# Patient Record
Sex: Female | Born: 1965
Health system: Southern US, Community
[De-identification: ages and names within clinical notes are randomized; demographics above are authoritative.]

## PROBLEM LIST (undated history)

## (undated) DIAGNOSIS — R011 Cardiac murmur, unspecified: Secondary | ICD-10-CM

## (undated) DIAGNOSIS — M5136 Other intervertebral disc degeneration, lumbar region: Secondary | ICD-10-CM

## (undated) DIAGNOSIS — J45909 Unspecified asthma, uncomplicated: Secondary | ICD-10-CM

## (undated) DIAGNOSIS — E7800 Pure hypercholesterolemia, unspecified: Secondary | ICD-10-CM

## (undated) DIAGNOSIS — M810 Age-related osteoporosis without current pathological fracture: Secondary | ICD-10-CM

## (undated) DIAGNOSIS — G4730 Sleep apnea, unspecified: Secondary | ICD-10-CM

## (undated) DIAGNOSIS — M5126 Other intervertebral disc displacement, lumbar region: Secondary | ICD-10-CM

## (undated) DIAGNOSIS — T8859XA Other complications of anesthesia, initial encounter: Secondary | ICD-10-CM

## (undated) DIAGNOSIS — G709 Myoneural disorder, unspecified: Secondary | ICD-10-CM

## (undated) DIAGNOSIS — K219 Gastro-esophageal reflux disease without esophagitis: Secondary | ICD-10-CM

## (undated) DIAGNOSIS — I10 Essential (primary) hypertension: Secondary | ICD-10-CM

## (undated) DIAGNOSIS — R002 Palpitations: Secondary | ICD-10-CM

## (undated) DIAGNOSIS — I38 Endocarditis, valve unspecified: Secondary | ICD-10-CM

## (undated) DIAGNOSIS — G35 Multiple sclerosis: Secondary | ICD-10-CM

## (undated) DIAGNOSIS — M8580 Other specified disorders of bone density and structure, unspecified site: Secondary | ICD-10-CM

## (undated) DIAGNOSIS — T7840XA Allergy, unspecified, initial encounter: Secondary | ICD-10-CM

## (undated) DIAGNOSIS — M1990 Unspecified osteoarthritis, unspecified site: Secondary | ICD-10-CM

## (undated) DIAGNOSIS — G35D Multiple sclerosis, unspecified: Secondary | ICD-10-CM

## (undated) DIAGNOSIS — D6851 Activated protein C resistance: Secondary | ICD-10-CM

## (undated) DIAGNOSIS — T4145XA Adverse effect of unspecified anesthetic, initial encounter: Secondary | ICD-10-CM

## (undated) DIAGNOSIS — M51369 Other intervertebral disc degeneration, lumbar region without mention of lumbar back pain or lower extremity pain: Secondary | ICD-10-CM

## (undated) HISTORY — DX: Allergy, unspecified, initial encounter: T78.40XA

## (undated) HISTORY — DX: Myoneural disorder, unspecified: G70.9

## (undated) HISTORY — DX: Essential (primary) hypertension: I10

## (undated) HISTORY — DX: Other intervertebral disc degeneration, lumbar region without mention of lumbar back pain or lower extremity pain: M51.369

## (undated) HISTORY — PX: CHOLECYSTECTOMY: SHX55

## (undated) HISTORY — DX: Pure hypercholesterolemia, unspecified: E78.00

## (undated) HISTORY — DX: Sleep apnea, unspecified: G47.30

## (undated) HISTORY — PX: NECK LESION BIOPSY: SHX2078

## (undated) HISTORY — DX: Palpitations: R00.2

## (undated) HISTORY — DX: Other specified disorders of bone density and structure, unspecified site: M85.80

## (undated) HISTORY — PX: TOE SURGERY: SHX1073

## (undated) HISTORY — PX: JOINT REPLACEMENT: SHX530

## (undated) HISTORY — DX: Multiple sclerosis, unspecified: G35.D

## (undated) HISTORY — PX: ABDOMINAL HYSTERECTOMY: SHX81

## (undated) HISTORY — PX: TUBAL LIGATION: SHX77

## (undated) HISTORY — DX: Endocarditis, valve unspecified: I38

## (undated) HISTORY — PX: APPENDECTOMY: SHX54

## (undated) HISTORY — DX: Multiple sclerosis: G35

## (undated) HISTORY — PX: OTHER SURGICAL HISTORY: SHX169

## (undated) HISTORY — PX: PATELLA FRACTURE SURGERY: SHX735

## (undated) HISTORY — PX: KNEE LIGAMENT RECONSTRUCTION: SHX1895

## (undated) HISTORY — DX: Unspecified asthma, uncomplicated: J45.909

## (undated) HISTORY — DX: Activated protein C resistance: D68.51

## (undated) HISTORY — DX: Other intervertebral disc displacement, lumbar region: M51.26

## (undated) HISTORY — DX: Age-related osteoporosis without current pathological fracture: M81.0

## (undated) HISTORY — PX: PELVIC LAPAROSCOPY: SHX162

## (undated) HISTORY — PX: TONSILLECTOMY: SUR1361

## (undated) HISTORY — PX: FRACTURE SURGERY: SHX138

## (undated) HISTORY — DX: Other intervertebral disc degeneration, lumbar region: M51.36

---
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
## 1998-03-28 ENCOUNTER — Other Ambulatory Visit: Admission: RE | Admit: 1998-03-28 | Discharge: 1998-03-28 | Payer: Self-pay | Admitting: Gynecology

## 1998-04-28 HISTORY — PX: TOTAL ABDOMINAL HYSTERECTOMY W/ BILATERAL SALPINGOOPHORECTOMY: SHX83

## 1998-07-31 ENCOUNTER — Inpatient Hospital Stay (HOSPITAL_COMMUNITY): Admission: RE | Admit: 1998-07-31 | Discharge: 1998-08-02 | Payer: Self-pay | Admitting: Gynecology

## 1998-12-04 ENCOUNTER — Other Ambulatory Visit: Admission: RE | Admit: 1998-12-04 | Discharge: 1998-12-04 | Payer: Self-pay | Admitting: Gynecology

## 1999-11-10 ENCOUNTER — Ambulatory Visit (HOSPITAL_COMMUNITY): Admission: RE | Admit: 1999-11-10 | Discharge: 1999-11-10 | Payer: Self-pay

## 2000-01-14 ENCOUNTER — Other Ambulatory Visit: Admission: RE | Admit: 2000-01-14 | Discharge: 2000-01-14 | Payer: Self-pay | Admitting: Gynecology

## 2001-01-14 ENCOUNTER — Other Ambulatory Visit: Admission: RE | Admit: 2001-01-14 | Discharge: 2001-01-14 | Payer: Self-pay | Admitting: Gynecology

## 2001-05-21 ENCOUNTER — Other Ambulatory Visit: Admission: RE | Admit: 2001-05-21 | Discharge: 2001-05-21 | Payer: Self-pay | Admitting: Otolaryngology

## 2001-06-01 ENCOUNTER — Other Ambulatory Visit: Admission: RE | Admit: 2001-06-01 | Discharge: 2001-06-01 | Payer: Self-pay | Admitting: Otolaryngology

## 2002-03-16 ENCOUNTER — Other Ambulatory Visit: Admission: RE | Admit: 2002-03-16 | Discharge: 2002-03-16 | Payer: Self-pay | Admitting: Gynecology

## 2003-03-21 ENCOUNTER — Other Ambulatory Visit: Admission: RE | Admit: 2003-03-21 | Discharge: 2003-03-21 | Payer: Self-pay | Admitting: Gynecology

## 2003-06-22 ENCOUNTER — Other Ambulatory Visit: Admission: RE | Admit: 2003-06-22 | Discharge: 2003-06-22 | Payer: Self-pay | Admitting: Gynecology

## 2004-05-03 ENCOUNTER — Other Ambulatory Visit: Admission: RE | Admit: 2004-05-03 | Discharge: 2004-05-03 | Payer: Self-pay | Admitting: Gynecology

## 2005-08-08 ENCOUNTER — Other Ambulatory Visit: Admission: RE | Admit: 2005-08-08 | Discharge: 2005-08-08 | Payer: Self-pay | Admitting: Gynecology

## 2005-11-16 ENCOUNTER — Emergency Department (HOSPITAL_COMMUNITY): Admission: EM | Admit: 2005-11-16 | Discharge: 2005-11-16 | Payer: Self-pay | Admitting: Emergency Medicine

## 2006-09-09 ENCOUNTER — Other Ambulatory Visit: Admission: RE | Admit: 2006-09-09 | Discharge: 2006-09-09 | Payer: Self-pay | Admitting: Gynecology

## 2007-12-02 ENCOUNTER — Other Ambulatory Visit: Admission: RE | Admit: 2007-12-02 | Discharge: 2007-12-02 | Payer: Self-pay | Admitting: Gynecology

## 2008-02-01 ENCOUNTER — Ambulatory Visit: Payer: Self-pay | Admitting: Gynecology

## 2008-06-15 ENCOUNTER — Ambulatory Visit: Payer: Self-pay | Admitting: Gynecology

## 2009-01-02 ENCOUNTER — Other Ambulatory Visit: Admission: RE | Admit: 2009-01-02 | Discharge: 2009-01-02 | Payer: Self-pay | Admitting: Gynecology

## 2009-01-02 ENCOUNTER — Ambulatory Visit: Payer: Self-pay | Admitting: Gynecology

## 2009-01-02 ENCOUNTER — Encounter: Payer: Self-pay | Admitting: Gynecology

## 2010-01-26 ENCOUNTER — Inpatient Hospital Stay (HOSPITAL_COMMUNITY): Admission: EM | Admit: 2010-01-26 | Discharge: 2010-01-31 | Payer: Self-pay | Admitting: Emergency Medicine

## 2010-01-26 DIAGNOSIS — L02419 Cutaneous abscess of limb, unspecified: Secondary | ICD-10-CM | POA: Insufficient documentation

## 2010-01-26 DIAGNOSIS — L03119 Cellulitis of unspecified part of limb: Secondary | ICD-10-CM

## 2010-01-27 ENCOUNTER — Ambulatory Visit: Payer: Self-pay | Admitting: Infectious Diseases

## 2010-02-11 ENCOUNTER — Telehealth: Payer: Self-pay | Admitting: Infectious Diseases

## 2010-02-11 ENCOUNTER — Telehealth: Payer: Self-pay | Admitting: Infectious Disease

## 2010-02-12 ENCOUNTER — Encounter: Payer: Self-pay | Admitting: Infectious Disease

## 2010-02-18 ENCOUNTER — Encounter: Payer: Self-pay | Admitting: Infectious Disease

## 2010-02-25 ENCOUNTER — Encounter: Payer: Self-pay | Admitting: Infectious Disease

## 2010-02-27 ENCOUNTER — Encounter (INDEPENDENT_AMBULATORY_CARE_PROVIDER_SITE_OTHER): Payer: Self-pay | Admitting: *Deleted

## 2010-02-27 ENCOUNTER — Ambulatory Visit: Payer: Self-pay | Admitting: Infectious Diseases

## 2010-02-27 DIAGNOSIS — Z8679 Personal history of other diseases of the circulatory system: Secondary | ICD-10-CM | POA: Insufficient documentation

## 2010-02-27 DIAGNOSIS — E7800 Pure hypercholesterolemia, unspecified: Secondary | ICD-10-CM

## 2010-02-27 HISTORY — DX: Pure hypercholesterolemia, unspecified: E78.00

## 2010-02-27 LAB — CONVERTED CEMR LAB
CRP: 1 mg/dL — ABNORMAL HIGH (ref <0.6)
Sed Rate: 18 mm/hr (ref 0–22)

## 2010-03-06 ENCOUNTER — Other Ambulatory Visit: Admission: RE | Admit: 2010-03-06 | Discharge: 2010-03-06 | Payer: Self-pay | Admitting: Gynecology

## 2010-03-06 ENCOUNTER — Ambulatory Visit: Payer: Self-pay | Admitting: Gynecology

## 2010-04-28 HISTORY — PX: REPLACEMENT TOTAL KNEE: SUR1224

## 2010-04-28 HISTORY — PX: TOTAL KNEE ARTHROPLASTY: SHX125

## 2010-05-30 NOTE — Progress Notes (Signed)
Summary: Care Plan Oversight  Phone Note Outgoing Call   Call placed by: Acey Lav MD,  February 11, 2010 4:43 PM Details for Reason: Care Plan Oversight Summary of Call: [phone](> 60 mins) I have supervised home care and/or infusion therapy for this pt, including providing orders for care, review of labs and/or home health care plans, communicating with the home health care professionals and/or patient/caregivers to integrate current information into the medical treatment plan and/or adjust the medical therapy. This supervision has been provided for _62__minutes during the calendar month. Dates for this oversight 01/31/10 thru 11/5/11_.  Osteomyelitis, septic arthritis  Initial call taken by: Acey Lav MD,  February 11, 2010 4:43 PM

## 2010-05-30 NOTE — Assessment & Plan Note (Signed)
Summary: hsfu need chart/inf r. knee   Vital Signs:  Patient profile:   45 year old female Height:      63 inches (160.02 cm) Weight:      206.25 pounds (93.75 kg) BMI:     36.67 Temp:     98.5 degrees F (36.94 degrees C) oral Pulse rate:   92 / minute BP sitting:   149 / 97  (left arm) Cuff size:   regular  Vitals Entered By: Jennet Maduro RN (February 27, 2010 11:07 AM) CC: HSFU Is Patient Diabetic? No Pain Assessment Patient in pain? yes     Location: bilateral knees Intensity: 4 Type: jburning Onset of pain  Chronic Nutritional Status BMI of > 30 = obese Nutritional Status Detail appetite "so-so"  Have you ever been in a relationship where you felt threatened, hurt or afraid?not asked, husband present   Does patient need assistance? Functional Status Self care Ambulation Impaired:Risk for fall Comments uses one crutch   CC:  [facility].  History of Present Illness: 45 yo F who  had a left knee arthroscopy around 01-17-10,  found to have DJD.  She returned to Ortho 9-30-11with  increasing pain,  knee was aspirated, sent to the lab, had a WBC count and her synovial fluid greater than 90,000. She was admitted to the hospital 10-2 and underwent I and D. Her cultures were negative. her ESR was 98 and CRP was 16.8. She was d/c home 10-6-11on 28 days of ceftriaxone and vancomycin.  Has been improving- walking getting better. Some mild swelling at incisions but this is improving. No problems with her PIC- had some bleeding after scab came loose. Some itching and soreness. No fevers or chills. had chills with vanco and this was changed to daptomycin. TOday is her last day of antibiotics. Dx with HTN since home, started on losaarten.   Preventive Screening-Counseling & Management  Alcohol-Tobacco     Alcohol drinks/day: 0     Smoking Status: never  Caffeine-Diet-Exercise     Caffeine use/day: yes     Does Patient Exercise: physical therapy at home  Safety-Violence-Falls  Seat Belt Use: yes  Current Medications (verified): 1)  Rocephin 1 Gm Solr (Ceftriaxone Sodium) .... Administer 2 Grams Via Picc Daily 2)  Ranitidine Hcl 75 Mg Tabs (Ranitidine Hcl) .... Take 1 Tablet By Mouth Once A Day 3)  Estrace 1 Mg Tabs (Estradiol) .... Take 1 Tablet By Mouth At Bedtime 4)  Gabapentin 600 Mg Tabs (Gabapentin) .... Take 1 Tablet By Mouth At Bedtime 5)  Meloxicam 15 Mg Tabs (Meloxicam) .... Take 1 Tablet By Mouth At Bedtime As Needed 6)  Simvastatin 40 Mg Tabs (Simvastatin) .... Take 1 Tablet By Mouth At Bedtime 7)  Tramadol Hcl 50 Mg Tabs (Tramadol Hcl) .... As Needed Pain 8)  Cozaar 50 Mg Tabs (Losartan Potassium) .... Take 1 Tablet By Mouth Once A Day 9)  Vicodin Es 7.5-750 Mg Tabs (Hydrocodone-Acetaminophen) .... As Needed Pain  Allergies: 1)  ! Erythromycin Base (Erythromycin Base) 2)  ! Morphine 3)  ! Vancomycin Hcl in Dextrose (Vancomycin Hcl in Dextrose)  Past History:  Past Medical History: Current Problems:  HYPERCHOLESTEROLEMIA (ICD-272.0) HORMONE REPLACEMENT THERAPY (ICD-V07.4) HYPERTENSION, HX OF (ICD-V12.50) CELLULITIS AND ABSCESS OF LEG EXCEPT FOOT (ICD-682.6)- arthiritis post arthroscopy  Family History: htn, coronary artery disease (father cabg 73yo),  breast ca (paternal aunt, paternal great aunt), grandfather died of lymphoma. colon cancer.   Social History: Married Never Smoked Alcohol use-yes- 1 drink /  year  Review of Systems       eating well, moving bowels well, wt steady. has not been back to the gym yet. mild dysuria, frequency. no diarrhea.   Physical Exam  General:  well-developed, well-nourished, and well-hydrated.   Eyes:  pupils equal, pupils round, and pupils reactive to light.   Mouth:  pharynx pink and moist and no exudates.   Neck:  no masses.   Lungs:  normal respiratory effort and normal breath sounds.   Heart:  normal rate, regular rhythm, and no murmur.   Abdomen:  soft, non-tender, and normal bowel sounds.     Msk:  RUE PIC- no d/c, nontender. no erythema.   L Knee- +effusion, non-tender, no erythema, well healed, no increase in heat.    Impression & Recommendations:  Problem # 1:  CELLULITIS AND ABSCESS OF LEG EXCEPT FOOT (ICD-682.6)  She has completed her antibiotics today. will recheck her ESR and CRP today. The only concern that I have is her effusion. Otherwise her knee looks great. Will pull her PIC and have her return as needed  The following medications were removed from the medication list:    Vancomycin Hcl 1000 Mg Solr (Vancomycin hcl) .Marland Kitchen... Administer via picc per protocol Her updated medication list for this problem includes:    Rocephin 1 Gm Solr (Ceftriaxone sodium) .Marland Kitchen... Administer 2 grams via picc daily    Tramadol Hcl 50 Mg Tabs (Tramadol hcl) .Marland Kitchen... As needed pain    Vicodin Es 7.5-750 Mg Tabs (Hydrocodone-acetaminophen) .Marland Kitchen... As needed pain  Orders: T-C-Reactive Protein [phone]) T-Sed Rate (Automated) [phone]) New Patient Level IV (41660)  Medications Added to Medication List This Visit: 1)  Tramadol Hcl 50 Mg Tabs (Tramadol hcl) .... As needed pain 2)  Cozaar 50 Mg Tabs (Losartan potassium) .... Take 1 tablet by mouth once a day 3)  Vicodin Es 7.5-750 Mg Tabs (Hydrocodone-acetaminophen) .... As needed pain   Orders Added: 1)  T-C-Reactive Protein [[id number]] 2)  T-Sed Rate (Automated) [[id number]] 3)  New Patient Level IV [54270]

## 2010-05-30 NOTE — Progress Notes (Signed)
Summary: [facility] - RN calling   Phone Note Other Incoming   Caller: [facility] Irving Shows RN [phone] Summary of Call: Pt was restarted  on Vanc which was decreaed  due to increase trough.  She is now on VAnc 1 gram . Due for lab redraw on Thursday. the ringing in her ears hav restarted and she has some right ear pain. Please advise.   Pt has appt Nov 2 with Dr Ninetta Lights.  Tomasita Morrow RN  February 11, 2010 10:25 AM

## 2010-05-30 NOTE — Miscellaneous (Signed)
Summary: Problems, Medications and Allergies updated  Clinical Lists Changes  Problems: Added new problem of CELLULITIS AND ABSCESS OF LEG EXCEPT FOOT (ICD-682.6) - septic left knee Added new problem of HYPERTENSION, HX OF (ICD-V12.50) Added new problem of HORMONE REPLACEMENT THERAPY (ICD-V07.4) Added new problem of HYPERCHOLESTEROLEMIA (ICD-272.0) Medications: Added new medication of ROCEPHIN 1 GM SOLR (CEFTRIAXONE SODIUM) Administer 2 grams via PICC daily Added new medication of VANCOMYCIN HCL 1000 MG SOLR (VANCOMYCIN HCL) administer via PICC per protocol Added new medication of RANITIDINE HCL 75 MG TABS (RANITIDINE HCL) Take 1 tablet by mouth once a day Added new medication of ESTRACE 1 MG TABS (ESTRADIOL) Take 1 tablet by mouth at bedtime Added new medication of GABAPENTIN 600 MG TABS (GABAPENTIN) Take 1 tablet by mouth at bedtime Added new medication of MELOXICAM 15 MG TABS (MELOXICAM) Take 1 tablet by mouth at bedtime as needed Added new medication of SIMVASTATIN 40 MG TABS (SIMVASTATIN) Take 1 tablet by mouth at bedtime Allergies: Added new allergy or adverse reaction of ERYTHROMYCIN BASE (ERYTHROMYCIN BASE) Added new allergy or adverse reaction of MORPHINE Observations: Added new observation of NKA: F (02/27/2010 10:22)

## 2010-05-30 NOTE — Miscellaneous (Signed)
Summary: HIPAA Restrictions  HIPAA Restrictions   Imported By: Florinda Marker 03/01/2010 10:23:35  _____________________________________________________________________  External Attachment:    Type:   Image     Comment:   External Document

## 2010-05-30 NOTE — Progress Notes (Signed)
Summary: Problems with Vancomycin  Phone Note From Other Clinic   Caller: [facility]  Irving Shows [phone] Summary of Call: Raynelle Fanning [phone]   Pt's Vanc was d/c and restarted at a lower dose due to a high Vanc trough. Restarted at 1gram daily. Pt has reported right ear pain with ringing in her ears.  Tomasita Morrow RN  February 11, 2010 11:14 AM   Follow-up for Phone Call        Per Dr Ninetta Lights,  D/C Vanc.  Start Daptomycin per pharmacy dosing , please include CPK in weekly labs . Continue Daptomycin for the same duration the Vanc. was to be dosed.  Tomasita Morrow RN  February 11, 2010 11:17 AM Verbal order/read back , verified. Tomasita Morrow RN  February 11, 2010 11:17 AM

## 2010-05-30 NOTE — Miscellaneous (Signed)
Summary: [facility]:Orders  [facility]:Orders   Imported By: Florinda Marker 02/19/2010 14:07:43  _____________________________________________________________________  External Attachment:    Type:   Image     Comment:   External Document

## 2010-05-30 NOTE — Medication Information (Signed)
Summary: [facility]: RX  [facility]: RX   Imported By: Florinda Marker 03/01/2010 12:01:54  _____________________________________________________________________  External Attachment:    Type:   Image     Comment:   External Document

## 2010-07-05 ENCOUNTER — Encounter: Payer: Self-pay | Admitting: Licensed Clinical Social Worker

## 2010-07-11 LAB — BASIC METABOLIC PANEL WITH GFR
BUN: 10 mg/dL (ref 6–23)
CO2: 26 meq/L (ref 19–32)
Calcium: 8.8 mg/dL (ref 8.4–10.5)
Chloride: 97 meq/L (ref 96–112)
Creatinine, Ser: 0.64 mg/dL (ref 0.4–1.2)
GFR calc non Af Amer: >60 mL/min
Glucose, Bld: 102 mg/dL — ABNORMAL HIGH (ref 70–99)
Potassium: 3.7 meq/L (ref 3.5–5.1)
Sodium: 131 meq/L — ABNORMAL LOW (ref 135–145)

## 2010-07-11 LAB — CBC
HCT: 34.7 % — ABNORMAL LOW (ref 36.0–46.0)
HCT: 35.5 % — ABNORMAL LOW (ref 36.0–46.0)
HCT: 38.8 % (ref 36.0–46.0)
Hemoglobin: 11.1 g/dL — ABNORMAL LOW (ref 12.0–15.0)
Hemoglobin: 11.3 g/dL — ABNORMAL LOW (ref 12.0–15.0)
Hemoglobin: 12.8 g/dL (ref 12.0–15.0)
MCH: 29 pg (ref 26.0–34.0)
MCH: 29.5 pg (ref 26.0–34.0)
MCH: 29.9 pg (ref 26.0–34.0)
MCHC: 31.8 g/dL (ref 30.0–36.0)
MCHC: 32 g/dL (ref 30.0–36.0)
MCHC: 33 g/dL (ref 30.0–36.0)
MCV: 90.7 fL (ref 78.0–100.0)
MCV: 91.3 fL (ref 78.0–100.0)
MCV: 92.3 fL (ref 78.0–100.0)
Platelets: 223 10*3/uL (ref 150–400)
Platelets: 238 10*3/uL (ref 150–400)
Platelets: 274 K/uL (ref 150–400)
RBC: 3.76 MIL/uL — ABNORMAL LOW (ref 3.87–5.11)
RBC: 3.89 MIL/uL (ref 3.87–5.11)
RBC: 4.28 MIL/uL (ref 3.87–5.11)
RDW: 12.3 % (ref 11.5–15.5)
RDW: 12.5 % (ref 11.5–15.5)
RDW: 12.5 % (ref 11.5–15.5)
WBC: 11.4 10*3/uL — ABNORMAL HIGH (ref 4.0–10.5)
WBC: 9.2 10*3/uL (ref 4.0–10.5)
WBC: 9.9 K/uL (ref 4.0–10.5)

## 2010-07-11 LAB — DIFFERENTIAL
Basophils Absolute: 0 10*3/uL (ref 0.0–0.1)
Basophils Absolute: 0 K/uL (ref 0.0–0.1)
Basophils Absolute: 0 K/uL (ref 0.0–0.1)
Basophils Relative: 0 % (ref 0–1)
Basophils Relative: 0 % (ref 0–1)
Basophils Relative: 0 % (ref 0–1)
Eosinophils Absolute: 0.1 10*3/uL (ref 0.0–0.7)
Eosinophils Absolute: 0.1 K/uL (ref 0.0–0.7)
Eosinophils Absolute: 0.2 K/uL (ref 0.0–0.7)
Eosinophils Relative: 1 % (ref 0–5)
Eosinophils Relative: 1 % (ref 0–5)
Eosinophils Relative: 2 % (ref 0–5)
Lymphocytes Relative: 18 % (ref 12–46)
Lymphocytes Relative: 19 % (ref 12–46)
Lymphocytes Relative: 29 % (ref 12–46)
Lymphs Abs: 1.8 K/uL (ref 0.7–4.0)
Lymphs Abs: 2.2 K/uL (ref 0.7–4.0)
Lymphs Abs: 2.7 10*3/uL (ref 0.7–4.0)
Monocytes Absolute: 1.4 10*3/uL — ABNORMAL HIGH (ref 0.1–1.0)
Monocytes Absolute: 1.4 K/uL — ABNORMAL HIGH (ref 0.1–1.0)
Monocytes Absolute: 1.5 K/uL — ABNORMAL HIGH (ref 0.1–1.0)
Monocytes Relative: 12 % (ref 3–12)
Monocytes Relative: 15 % — ABNORMAL HIGH (ref 3–12)
Monocytes Relative: 15 % — ABNORMAL HIGH (ref 3–12)
Neutro Abs: 5 10*3/uL (ref 1.7–7.7)
Neutro Abs: 6.4 K/uL (ref 1.7–7.7)
Neutro Abs: 7.6 K/uL (ref 1.7–7.7)
Neutrophils Relative %: 55 % (ref 43–77)
Neutrophils Relative %: 64 % (ref 43–77)
Neutrophils Relative %: 67 % (ref 43–77)

## 2010-07-11 LAB — BASIC METABOLIC PANEL
BUN: 3 mg/dL — ABNORMAL LOW (ref 6–23)
CO2: 29 mEq/L (ref 19–32)
Calcium: 8.8 mg/dL (ref 8.4–10.5)
Chloride: 99 mEq/L (ref 96–112)
Creatinine, Ser: 0.67 mg/dL (ref 0.4–1.2)
GFR calc Af Amer: >60 mL/min (ref >60)
GFR calc non Af Amer: >60 mL/min (ref >60)
Glucose, Bld: 130 mg/dL — ABNORMAL HIGH (ref 70–99)
Potassium: 3.5 mEq/L (ref 3.5–5.1)
Sodium: 137 mEq/L (ref 135–145)

## 2010-07-11 LAB — PROTIME-INR
INR: 0.89 (ref 0.00–1.49)
Prothrombin Time: 12.2 seconds (ref 11.6–15.2)

## 2010-07-11 LAB — VANCOMYCIN, TROUGH
Vancomycin Tr: 14.3 ug/mL (ref 10.0–20.0)
Vancomycin Tr: 5.9 ug/mL — ABNORMAL LOW (ref 10.0–20.0)

## 2010-07-11 LAB — APTT: aPTT: 23 s — ABNORMAL LOW (ref 24–37)

## 2010-07-11 LAB — SEDIMENTATION RATE: Sed Rate: 98 mm/hr — ABNORMAL HIGH (ref 0–22)

## 2010-07-11 LAB — C-REACTIVE PROTEIN: CRP: 16.8 mg/dL — ABNORMAL HIGH (ref <0.6)

## 2010-07-15 ENCOUNTER — Encounter (HOSPITAL_COMMUNITY)
Admission: RE | Admit: 2010-07-15 | Discharge: 2010-07-15 | Disposition: A | Discharge status: Home / Self Care | Payer: BC Managed Care – PPO | Source: Ambulatory Visit | Attending: Orthopaedic Surgery | Admitting: Orthopaedic Surgery

## 2010-07-15 DIAGNOSIS — Z01812 Encounter for preprocedural laboratory examination: Secondary | ICD-10-CM | POA: Insufficient documentation

## 2010-07-15 LAB — CBC
HCT: 39.9 % (ref 36.0–46.0)
Hemoglobin: 13.3 g/dL (ref 12.0–15.0)
MCH: 30 pg (ref 26.0–34.0)
MCHC: 33.3 g/dL (ref 30.0–36.0)
MCV: 90.1 fL (ref 78.0–100.0)
Platelets: 259 10*3/uL (ref 150–400)
RBC: 4.43 MIL/uL (ref 3.87–5.11)
RDW: 11.7 % (ref 11.5–15.5)
WBC: 7.1 10*3/uL (ref 4.0–10.5)

## 2010-07-15 LAB — URINALYSIS, ROUTINE W REFLEX MICROSCOPIC
Bilirubin Urine: NEGATIVE
Glucose, UA: NEGATIVE mg/dL
Ketones, ur: NEGATIVE mg/dL
Leukocytes, UA: NEGATIVE
Nitrite: NEGATIVE
Protein, ur: NEGATIVE mg/dL
Specific Gravity, Urine: 1.015 (ref 1.005–1.030)
Urobilinogen, UA: 0.2 mg/dL (ref 0.0–1.0)
pH: 6 (ref 5.0–8.0)

## 2010-07-15 LAB — BASIC METABOLIC PANEL
BUN: 7 mg/dL (ref 6–23)
CO2: 30 mEq/L (ref 19–32)
Calcium: 9.2 mg/dL (ref 8.4–10.5)
Chloride: 103 mEq/L (ref 96–112)
Creatinine, Ser: 0.68 mg/dL (ref 0.4–1.2)
GFR calc Af Amer: >60 mL/min (ref >60)
GFR calc non Af Amer: >60 mL/min (ref >60)
Glucose, Bld: 95 mg/dL (ref 70–99)
Potassium: 4.2 mEq/L (ref 3.5–5.1)
Sodium: 137 mEq/L (ref 135–145)

## 2010-07-15 LAB — SURGICAL PCR SCREEN
MRSA, PCR: NEGATIVE
Staphylococcus aureus: POSITIVE — AB

## 2010-07-15 LAB — URINE MICROSCOPIC-ADD ON

## 2010-07-18 ENCOUNTER — Other Ambulatory Visit (HOSPITAL_COMMUNITY): Payer: Self-pay

## 2010-07-23 ENCOUNTER — Inpatient Hospital Stay (HOSPITAL_COMMUNITY)
Admission: RE | Admit: 2010-07-23 | Discharge: 2010-07-26 | DRG: 209 | Disposition: A | Discharge status: Home Health | Payer: BC Managed Care – PPO | Source: Ambulatory Visit | Attending: Orthopaedic Surgery | Admitting: Orthopaedic Surgery

## 2010-07-23 DIAGNOSIS — I10 Essential (primary) hypertension: Secondary | ICD-10-CM | POA: Diagnosis present

## 2010-07-23 DIAGNOSIS — E785 Hyperlipidemia, unspecified: Secondary | ICD-10-CM | POA: Diagnosis present

## 2010-07-23 DIAGNOSIS — R112 Nausea with vomiting, unspecified: Secondary | ICD-10-CM | POA: Diagnosis not present

## 2010-07-23 DIAGNOSIS — M1710 Unilateral primary osteoarthritis, unspecified knee: Principal | ICD-10-CM | POA: Diagnosis present

## 2010-07-23 DIAGNOSIS — Z79890 Hormone replacement therapy (postmenopausal): Secondary | ICD-10-CM

## 2010-07-23 DIAGNOSIS — Z01812 Encounter for preprocedural laboratory examination: Secondary | ICD-10-CM

## 2010-07-23 LAB — PROTIME-INR
INR: 0.95 (ref 0.00–1.49)
Prothrombin Time: 12.9 seconds (ref 11.6–15.2)

## 2010-07-23 LAB — APTT: aPTT: 24 seconds (ref 24–37)

## 2010-07-24 LAB — CBC
HCT: 34.6 % — ABNORMAL LOW (ref 36.0–46.0)
Hemoglobin: 11.3 g/dL — ABNORMAL LOW (ref 12.0–15.0)
MCH: 29.4 pg (ref 26.0–34.0)
MCHC: 32.7 g/dL (ref 30.0–36.0)
MCV: 89.9 fL (ref 78.0–100.0)
Platelets: 237 10*3/uL (ref 150–400)
RBC: 3.85 MIL/uL — ABNORMAL LOW (ref 3.87–5.11)
RDW: 11.8 % (ref 11.5–15.5)
WBC: 10.2 10*3/uL (ref 4.0–10.5)

## 2010-07-24 LAB — PROTIME-INR
INR: 1.02 (ref 0.00–1.49)
Prothrombin Time: 13.6 seconds (ref 11.6–15.2)

## 2010-07-24 LAB — BASIC METABOLIC PANEL
BUN: 5 mg/dL — ABNORMAL LOW (ref 6–23)
CO2: 27 mEq/L (ref 19–32)
Calcium: 8.6 mg/dL (ref 8.4–10.5)
Chloride: 99 mEq/L (ref 96–112)
Creatinine, Ser: 0.68 mg/dL (ref 0.4–1.2)
GFR calc Af Amer: >60 mL/min (ref >60)
GFR calc non Af Amer: >60 mL/min (ref >60)
Glucose, Bld: 112 mg/dL — ABNORMAL HIGH (ref 70–99)
Potassium: 3.9 mEq/L (ref 3.5–5.1)
Sodium: 134 mEq/L — ABNORMAL LOW (ref 135–145)

## 2010-07-25 LAB — BASIC METABOLIC PANEL
BUN: 2 mg/dL — ABNORMAL LOW (ref 6–23)
CO2: 30 mEq/L (ref 19–32)
Calcium: 8.7 mg/dL (ref 8.4–10.5)
Chloride: 99 mEq/L (ref 96–112)
Creatinine, Ser: 0.58 mg/dL (ref 0.4–1.2)
GFR calc Af Amer: >60 mL/min (ref >60)
GFR calc non Af Amer: >60 mL/min (ref >60)
Glucose, Bld: 117 mg/dL — ABNORMAL HIGH (ref 70–99)
Potassium: 3.6 mEq/L (ref 3.5–5.1)
Sodium: 138 mEq/L (ref 135–145)

## 2010-07-25 LAB — CBC
HCT: 33.6 % — ABNORMAL LOW (ref 36.0–46.0)
Hemoglobin: 10.8 g/dL — ABNORMAL LOW (ref 12.0–15.0)
MCH: 28.8 pg (ref 26.0–34.0)
MCHC: 32.1 g/dL (ref 30.0–36.0)
MCV: 89.6 fL (ref 78.0–100.0)
Platelets: 202 10*3/uL (ref 150–400)
RBC: 3.75 MIL/uL — ABNORMAL LOW (ref 3.87–5.11)
RDW: 11.8 % (ref 11.5–15.5)
WBC: 7.5 10*3/uL (ref 4.0–10.5)

## 2010-07-25 LAB — PROTIME-INR
INR: 1.66 — ABNORMAL HIGH (ref 0.00–1.49)
Prothrombin Time: 19.8 seconds — ABNORMAL HIGH (ref 11.6–15.2)

## 2010-07-26 LAB — CBC
HCT: 33.4 % — ABNORMAL LOW (ref 36.0–46.0)
Hemoglobin: 11 g/dL — ABNORMAL LOW (ref 12.0–15.0)
MCH: 29.8 pg (ref 26.0–34.0)
MCHC: 32.9 g/dL (ref 30.0–36.0)
MCV: 90.5 fL (ref 78.0–100.0)
Platelets: 221 10*3/uL (ref 150–400)
RBC: 3.69 MIL/uL — ABNORMAL LOW (ref 3.87–5.11)
RDW: 11.9 % (ref 11.5–15.5)
WBC: 7.3 10*3/uL (ref 4.0–10.5)

## 2010-07-26 LAB — BASIC METABOLIC PANEL
BUN: 2 mg/dL — ABNORMAL LOW (ref 6–23)
CO2: 29 mEq/L (ref 19–32)
Calcium: 8.9 mg/dL (ref 8.4–10.5)
Chloride: 98 mEq/L (ref 96–112)
Creatinine, Ser: 0.62 mg/dL (ref 0.4–1.2)
GFR calc Af Amer: >60 mL/min (ref >60)
GFR calc non Af Amer: >60 mL/min (ref >60)
Glucose, Bld: 109 mg/dL — ABNORMAL HIGH (ref 70–99)
Potassium: 3.5 mEq/L (ref 3.5–5.1)
Sodium: 138 mEq/L (ref 135–145)

## 2010-07-26 LAB — PROTIME-INR
INR: 1.74 — ABNORMAL HIGH (ref 0.00–1.49)
Prothrombin Time: 20.5 seconds — ABNORMAL HIGH (ref 11.6–15.2)

## 2010-08-03 NOTE — Discharge Summary (Signed)
Renee Flores, Renee Flores                  ACCOUNT NO.:  [id number]  MEDICAL RECORD NO.:  [id number]           PATIENT TYPE:  I  LOCATION:  5019                         FACILITY:  [facility]  PHYSICIAN:  Renee Flores, M.D.DATE OF BIRTH:  [birth year]  DATE OF ADMISSION:  07/23/2010 DATE OF DISCHARGE:  07/26/2010                              DISCHARGE SUMMARY   ADMITTING DIAGNOSES: 1. Left knee end-stage degenerative joint disease. 2. Hyperlipidemia. 3. Hypertension. 4. Hormone replacement therapy.  DISCHARGE DIAGNOSES: 1. Left knee end-stage degenerative joint disease. 2. Hyperlipidemia. 3. Hypertension. 4. Hormone replacement therapy.  OPERATION:  Left total knee replacement.  BRIEF HISTORY:  Ms. Renee Flores is 45 years old who has had long-standing history of left knee pain.  She has had one or two arthroscopies, even had a suspected infection even though no bacteria was ever cultured within the last 12 months.  At this point, her knee is very degenerative, there is no active infection.  It has been aspirated and nothing was grown in the lab, and I have discussed with her treatment options that being total knee replacement with the risk of anesthesia, infection, DVT, and possible death.  PERTINENT LABORATORY DATA AND X-RAY FINDINGS:  Hemoglobin 10.8, hematocrit 33.6, WBCs 7.5, platelets 202.  Chem-7:  Sodium 138, potassium 3.6, BUN 2, creatinine 0.58, glucose 117.  Last INR available at the time of dictation was 1.66.  COURSE IN THE HOSPITAL:  Ms. Renee Flores was admitted postoperatively.  We used the order control sheet for a Dilaudid PCA pump as well as total joint replacement protocols.  We advanced her diet slowly.  Lovenox and Coumadin for DVT prophylaxis per pharmacy, IV Ancef 1 g q.6 h. x3 doses perioperatively, appropriate stool softeners.  Foley catheter as needed in the first 24 hours and then she was able to void on her own.  Then appropriate antiemetics, antispasmodics, and pain  medications.  She could be weightbearing as tolerated.  Knee-high TEDs.  Incentive spirometer was also used and physical therapy out of bed weightbearing as tolerated.  First day postop, she was of relatively comfortable, had a little bit of anxiety and was given a dose of Xanax which did seem to help her.  Temperature was 98, blood pressure 103/68.  Lungs were clear. Abdomen was soft.  Positive bowel sounds.  Knee was moving well on the CPM 0-65.  Drain in place.  No sign of infection.  Calf soft and nontender.  On the second day postop, her vital signs remained stable and her drain was pulled from her left knee and a dressing was changed. Wound was noted to be benign, and she was ambulating in the hallway with physical therapy.  Arrangements have been made for home physical therapy and blood draws to keep her INR between 2 and 3 on the Coumadin for 2 weeks.  She progressed well and was discharged home.  CONDITION ON DISCHARGE:  Improved.  She will use crutches or walker to ambulate, weightbearing as tolerated. No diet restrictions.  May change her dressing daily.  Follow up with Dr. Jerl Flores on a preset appointment for 2  weeks postoperatively calling [phone].  Any sign of infection, drainage, increasing pain, redness to call that same number, we would see her immediately.  She will be on Coumadin for 2 weeks with target INR between 2.0 and 3.0.  She is kept on her home medicines and all medicines given to her in the hospital on discharge and ones that we wanted to keep her on will be available in the [facility] discharge sheet.     Renee Flores, P.A.   ______________________________ Renee Basque. Renee Flores, M.D.    MC/MEDQ  D:  07/25/2010  T:  07/26/2010  Job:  [id number]  Electronically Signed by Renee Flores P.A. on 08/01/2010 10:26:12 AM Electronically Signed by Renee Flores M.D. on 08/03/2010 01:15:24 PM

## 2010-08-03 NOTE — Op Note (Signed)
NAMEJONNETTE, NUON                  ACCOUNT NO.:  [id number]  MEDICAL RECORD NO.:  [id number]           PATIENT TYPE:  I  LOCATION:  5019                         FACILITY:  [facility]  PHYSICIAN:  Lubertha Basque. Caramia Boutin, M.D.DATE OF BIRTH:  1965/07/28  DATE OF PROCEDURE:  07/23/2010 DATE OF DISCHARGE:                              OPERATIVE REPORT   PREOPERATIVE DIAGNOSIS:  Left knee degenerative joint disease.  POSTOPERATIVE DIAGNOSIS:  Left knee degenerative joint disease.  PROCEDURE:  Left total knee replacement.  ANESTHESIA:  General and block.  ATTENDING SURGEON:  Lubertha Basque. Jerl Santos, MD  ASSISTANT:  Lindwood Qua, PA   INDICATION FOR PROCEDURE:  The patient is a 45 year old woman with a long history of painful knees.  On the left, she has had several procedures including an arthroscopy which unfortunately was complicated with a possible postoperative infection.  She underwent irrigation and debridement and a course of IV antibiotics maybe 6 months back and has done well in terms of eradicating the infection.  Unfortunately, she has been left with a very painful knee.  She had advanced degenerative change seen in arthroscopy prior to this procedure.  By x-ray, she also has advanced degenerative change.  She has failed various injectables including cortisone and Supartz and at this point, is offered a knee replacement.  Informed operative consent was obtained after discussion of possible complications including reaction to anesthesia, infection, DVT, PE, and death.  The importance of the postoperative rehabilitation protocol to optimize result was stressed with the patient.  SUMMARY OF FINDINGS AND PROCEDURE:  Under general anesthesia and a block, a left knee replacement was performed.  She had advanced degenerative change in all 3 compartments, but excellent bone quality. She had normal-appearing joint fluid.  We addressed her problem with a cemented DePuy system using a  standard femur, 4 MBT tibial tray, 32 all- polyethylene patella, and 15 deep dish spacer.  Lindwood Qua assisted throughout and was inviable to the completion of the case in that he helped position and retract while I performed the procedure.  He also closed simultaneously to help minimize OR time.  I did include Zinacef antibiotic in the cement.  DESCRIPTION OF PROCEDURE:  The patient was taken to the operating suite where general anesthetic was applied without difficulty.  She was also given a block in the preanesthesia area.  She was positioned supine and prepped and draped in normal sterile fashion.  After administration of IV Kefzol, the left leg was elevated, exsanguinated, and tourniquet inflated about the thigh.  A longitudinal incision was made with dissection down the extensor mechanism.  All appropriate antiinfective measures were used including closed hooded exhaust systems for each member of surgical team, Betadine impregnated drape, and preoperative IV antibiotic.  A medial parapatellar incision was made.  The kneecap wasflipped and the knee flexed.  Some residual meniscal tissues were removed along with the ACL and PCL.  Findings were as noted above.  We placed an intramedullary guide in the tibia to make a roughly flat cut followed by another intramedullary guide in the femur making anterior and posterior cuts creating  a flexion gap of 15 mm.  A second intramedullary guide was placed in the femur making a distal cut creating an equal extension gap of 15 mm balancing the knee.  The femur sized to a standard and the tibia to a fourth appropriate guides placed and utilized including the short stem on the tibia.  Patella was cut down the thickness by about 8 mm to 14 in thickness and sized to a 32 with appropriate guide placed and utilized.  A trial reduction was done with these components and she easily came to slight hyperextension and flexed well.  The trial components  were removed followed by pulsatile lavage irrigation of all 3 cut bony surfaces.  Cement was mixed including Zinacef antibiotic and was pressurized onto the bones followed by placement of the aforementioned DePuy components.  Excess cement was trimmed and pressure was held on components until the cement hardened. The tourniquet was deflated and a small amount of bleeding was easily controlled with Bovie cautery and some pressure.  The knee was again irrigated followed by placement of the drain exiting superolaterally. The extensor mechanism was reapproximated with #1 Vicryl in interrupted fashion followed by subcutaneous reapproximation with 0 and 2-0 undyed Vicryl and skin closure with staples.  Adaptic was applied followed by dry gauze and loose Ace wrap.  Estimated blood loss and intraoperative fluids can be obtained from anesthesia records as can accurate tourniquet time.  DISPOSITION:  The patient was extubated in the operating room and taken to recovery room in stable addition.  She was to be admitted to the Orthopedic Surgery Service for appropriate postop care to include perioperative antibiotics and Coumadin plus Lovenox for DVT prophylaxis.     Lubertha Basque Jerl Santos, M.D.     PGD/MEDQ  D:  07/23/2010  T:  07/24/2010  Job:  [id number]  Electronically Signed by Marcene Corning M.D. on 08/03/2010 01:15:20 PM

## 2010-09-13 NOTE — Op Note (Signed)
[facility]  Patient:    Renee Flores, Renee Flores                           MRN: [id number] Proc. Date: 11/10/00 Attending:  Fayrene Fearing A. Ashley Royalty, M.D.                           Operative Report  PREOPERATIVE DIAGNOSIS:       Desire for attempt at permanent surgical sterilization.  POSTOPERATIVE DIAGNOSIS:      Desire for attempt at permanent surgical sterilization.  PROCEDURE:                    Bilateral tubal sterilization.  SURGEON:                      Rudy Jew. Ashley Royalty, M.D.  ANESTHESIA:                   Epidural.  ESTIMATED BLOOD LOSS:         Less than 25 cc.  COMPLICATIONS:                None.  PACKS AND DRAINS:             None.  DESCRIPTION OF PROCEDURE:     The patient was taken to the operating room and placed in the dorsal supine position.  After adequate epidural anesthesia was administered she was prepped and draped in the usual manner for abdominal surgery.  A 1.5 cm infraumbilical incision was made in the transverse plane. The subcutaneous tissues were sharply dissected down to the fascia, which was entered with a knife and incised transversely with Mayo scissors.  The peritoneum was elevated with Hemostats and entered atraumatically with Metzenbaum scissors.  Using [employer] retractors, the right fallopian tube was grasped with Babcock clamp and traced to its fimbriated end.  An avascular area in the distal isthmus to proximal angulated portion was stretched for the ligation.  A #1 plain catgut suture was placed around this knuckle of tube and tied securely.  A second suture was placed as well.  The intervening portion of fallopian tube was excised with Metzenbaum scissors and submitted to pathology for histologic studies.  Hemostasis was noted, and the remainder of the fallopian tube was allowed to return to the abdominal cavity.  A similar technique was used on the left side to identify the fallopian tube. It was grasped with Babcock  clamp and traced to its fimbriated end.  An avascular area in the distal isthmus to proximal anterior portion was chosen for ligation.  A #1 plain catgut suture was then placed around this knuckle of tube and tied securely.  A second suture was placed as well and tied securely. The intervening portion of fallopian tube was excised and submitted to pathology for histologic studies.  Hemostasis was noted.  The remainder of the left fallopian tube was returned to the abdominal cavity.  The fascia was closed with 0 Vicryl in a running fashion.  The skin was closed with 3-0 chromic in a subcuticular fashion.  The patient tolerated the procedure extremely well, and was returned to the recovery room in good condition. DD:  11/11/00 TD:  11/12/00 Job: [id number]

## 2011-03-05 ENCOUNTER — Encounter: Payer: Self-pay | Admitting: *Deleted

## 2011-03-05 DIAGNOSIS — G35 Multiple sclerosis: Secondary | ICD-10-CM | POA: Insufficient documentation

## 2011-03-05 DIAGNOSIS — E7800 Pure hypercholesterolemia, unspecified: Secondary | ICD-10-CM | POA: Insufficient documentation

## 2011-03-05 DIAGNOSIS — E559 Vitamin D deficiency, unspecified: Secondary | ICD-10-CM | POA: Insufficient documentation

## 2011-03-05 DIAGNOSIS — M8580 Other specified disorders of bone density and structure, unspecified site: Secondary | ICD-10-CM | POA: Insufficient documentation

## 2011-03-10 ENCOUNTER — Encounter: Payer: Self-pay | Admitting: Gynecology

## 2011-03-10 ENCOUNTER — Ambulatory Visit (INDEPENDENT_AMBULATORY_CARE_PROVIDER_SITE_OTHER): Payer: BC Managed Care – PPO | Admitting: Gynecology

## 2011-03-10 VITALS — BP 134/84 | Ht 64.0 in | Wt 208.0 lb

## 2011-03-10 DIAGNOSIS — Z01419 Encounter for gynecological examination (general) (routine) without abnormal findings: Secondary | ICD-10-CM

## 2011-03-10 DIAGNOSIS — Z79890 Hormone replacement therapy (postmenopausal): Secondary | ICD-10-CM

## 2011-03-10 DIAGNOSIS — M899 Disorder of bone, unspecified: Secondary | ICD-10-CM

## 2011-03-10 DIAGNOSIS — M8580 Other specified disorders of bone density and structure, unspecified site: Secondary | ICD-10-CM

## 2011-03-10 MED ORDER — ESTRADIOL 1 MG PO TABS: 1.5000 mg | ORAL_TABLET | Freq: Every day | ORAL | Status: DC

## 2011-03-10 NOTE — Progress Notes (Signed)
Renee Flores [birth year] [id number]        45 y.o.  for annual exam.  Doing well on estradiol 1.5 mg daily wants to continue.  Past medical history,surgical history, medications, allergies, family history and social history were all reviewed and documented in the EPIC chart. ROS:  Was performed and pertinent positives and negatives are included in the history.  Exam: chaperone present Filed Vitals:   03/10/11 1001  BP: 134/84   General appearance  Normal Skin grossly normal Head/Neck normal with no cervical or supraclavicular adenopathy thyroid normal Lungs  clear Cardiac RR, without RMG Abdominal  soft, nontender, without masses, organomegaly or hernia Breasts  examined lying and sitting without masses, retractions, discharge or axillary adenopathy. Pelvic  Ext/BUS/vagina  normal   Adnexa  Without masses or tenderness    Anus and perineum  normal   Rectovaginal  normal sphincter tone without palpated masses or tenderness.    Assessment/Plan:  45 y.o. female for annual exam.   Doing well on ERT.  1. ERT.  We discussed the risks benefits to be WHI study stroke heart attack DVT breast cancer risks all reviewed patient wants to continue it I refilled her estradiol 1.5 mg daily.    2. Pap smear screening.  I did not do a Pap smear this year. She is status post hysterectomy. She does have a history of ASCUS in 2000 with annual follow up Pap smears all normal afterwards. Less frequent screening protocol as well stop them altogether was discussed. I think given her past history of ASCUS possible low-grade changes that will plan a less frequent screening interval at present with every three-year Pap smears.  3. Osteopenia.  Does have history of osteopenia through a bone density at an orthopedic office a number of years ago. I scheduled a repeat now. 4. Health maintenance.  SBE monthly review she has not had a mammogram in 2 years no supports of scheduling this and agrees to do so.  No blood work  was done today as she is an appointment for her annual exam with her primary this week and will have all the blood work done through their office. Assuming she continues well from a gynecologic standpoint she'll see me in a year sooner as needed.    Dara Lords MD, 10:37 AM 03/10/2011

## 2011-03-27 ENCOUNTER — Other Ambulatory Visit: Payer: Self-pay | Admitting: Gynecology

## 2011-04-01 ENCOUNTER — Telehealth: Payer: Self-pay | Admitting: *Deleted

## 2011-04-01 MED ORDER — ESTRADIOL 1 MG PO TABS: 1.5000 mg | ORAL_TABLET | Freq: Every day | ORAL | Status: DC

## 2011-04-01 NOTE — Telephone Encounter (Signed)
PHARMACY FAXED IF OKAY FOR PT TO HAVE ESTRADIOL 1.5 MG 3 90 DAY SUPPLY. RX SENT TO PHARMACY.

## 2011-04-03 ENCOUNTER — Ambulatory Visit (INDEPENDENT_AMBULATORY_CARE_PROVIDER_SITE_OTHER): Payer: BC Managed Care – PPO

## 2011-04-03 DIAGNOSIS — M8580 Other specified disorders of bone density and structure, unspecified site: Secondary | ICD-10-CM

## 2011-04-03 DIAGNOSIS — M949 Disorder of cartilage, unspecified: Secondary | ICD-10-CM

## 2011-04-03 DIAGNOSIS — M899 Disorder of bone, unspecified: Secondary | ICD-10-CM

## 2011-04-10 ENCOUNTER — Encounter: Payer: Self-pay | Admitting: Gynecology

## 2011-04-10 ENCOUNTER — Telehealth: Payer: Self-pay | Admitting: Gynecology

## 2011-04-10 NOTE — Telephone Encounter (Addendum)
Tell patient that bone density did show osteopenia. Is not to the degree that we would want to treat it with medication but I do emphasize the need to maximize calcium intake to 1200 mg total dietary daily and vitamin D 1000 units daily. We will recheck the bone density in 2 years.

## 2011-04-11 NOTE — Telephone Encounter (Signed)
Pt informed with the below note. 

## 2012-02-17 ENCOUNTER — Other Ambulatory Visit: Payer: Self-pay | Admitting: *Deleted

## 2012-02-17 MED ORDER — ESTRADIOL 1 MG PO TABS: 1.5000 mg | ORAL_TABLET | Freq: Every day | ORAL | Status: DC

## 2012-02-27 ENCOUNTER — Other Ambulatory Visit: Payer: Self-pay | Admitting: Gastroenterology

## 2012-02-27 DIAGNOSIS — R1310 Dysphagia, unspecified: Secondary | ICD-10-CM

## 2012-03-03 ENCOUNTER — Ambulatory Visit
Admission: RE | Admit: 2012-03-03 | Discharge: 2012-03-03 | Disposition: A | Discharge status: Home / Self Care | Payer: BC Managed Care – PPO | Source: Ambulatory Visit | Attending: Gastroenterology | Admitting: Gastroenterology

## 2012-03-03 DIAGNOSIS — R1310 Dysphagia, unspecified: Secondary | ICD-10-CM

## 2012-03-18 ENCOUNTER — Encounter: Payer: BC Managed Care – PPO | Admitting: Gynecology

## 2012-03-23 ENCOUNTER — Encounter: Payer: Self-pay | Admitting: Cardiovascular Disease

## 2012-03-23 ENCOUNTER — Ambulatory Visit (INDEPENDENT_AMBULATORY_CARE_PROVIDER_SITE_OTHER): Payer: BC Managed Care – PPO | Admitting: Cardiovascular Disease

## 2012-03-23 VITALS — BP 130/96 | HR 81 | Ht 64.0 in | Wt 199.8 lb

## 2012-03-23 DIAGNOSIS — Z01810 Encounter for preprocedural cardiovascular examination: Secondary | ICD-10-CM

## 2012-03-23 DIAGNOSIS — Z0000 Encounter for general adult medical examination without abnormal findings: Secondary | ICD-10-CM | POA: Insufficient documentation

## 2012-03-23 DIAGNOSIS — R0989 Other specified symptoms and signs involving the circulatory and respiratory systems: Secondary | ICD-10-CM

## 2012-03-23 DIAGNOSIS — D682 Hereditary deficiency of other clotting factors: Secondary | ICD-10-CM

## 2012-03-23 DIAGNOSIS — Z8679 Personal history of other diseases of the circulatory system: Secondary | ICD-10-CM

## 2012-03-23 DIAGNOSIS — R0600 Dyspnea, unspecified: Secondary | ICD-10-CM | POA: Insufficient documentation

## 2012-03-23 DIAGNOSIS — E7800 Pure hypercholesterolemia, unspecified: Secondary | ICD-10-CM

## 2012-03-23 DIAGNOSIS — R0609 Other forms of dyspnea: Secondary | ICD-10-CM

## 2012-03-23 HISTORY — DX: Hereditary deficiency of other clotting factors: D68.2

## 2012-03-23 HISTORY — DX: Encounter for preprocedural cardiovascular examination: Z01.810

## 2012-03-23 NOTE — Assessment & Plan Note (Signed)
F/U Dr Myna Hidalgo may need chronic anticoagulation although no clinical thormbosis

## 2012-03-23 NOTE — Assessment & Plan Note (Signed)
Clear to have any surgery/procedure including esophageal stretching

## 2012-03-23 NOTE — Patient Instructions (Signed)
Your physician recommends that you schedule a follow-up appointment in:  AS NEEDED Your physician recommends that you continue on your current medications as directed. Please refer to the Current Medication list given to you today.  Your physician has requested that you have an echocardiogram. Echocardiography is a painless test that uses sound waves to create images of your heart. It provides your doctor with information about the size and shape of your heart and how well your heart's chambers and valves are working. This procedure takes approximately one hour. There are no restrictions for this procedure. DX  DYSPNEA 

## 2012-03-23 NOTE — Assessment & Plan Note (Signed)
Cholesterol is at goal.  Continue current dose of statin and diet Rx.  No myalgias or side effects.  F/U  LFT's in 6 months. No results found for this basename: LDLCALC  Labs with Dr Barnes            

## 2012-03-23 NOTE — Progress Notes (Signed)
Patient ID: Renee Flores, female   DOB: [birth year], 46 y.o.   MRN: [id number] 46 yo referred by primary Mild dyspnea on exertion that sounds functional.  Sedentary and overweight  Family history of Facto 5 with brother having PE.  She tested positive recently but still needs to f/u with Dr Myna Hidalgo.  Needs esophageal dilatation.  Chronic hiatal hernia and dysphagia.  Procedure canceled until "heart" ok and anticoagulation issues resolved.  No chest pain outside of belching from dysphagia  No Clinical history of DVT or PE. Non smoker with no chronic lung disease.  Had left knee replacement last year with no difficulty  ROS: Denies fever, malais, weight loss, blurry vision, decreased visual acuity, cough, sputum, SOB, hemoptysis, pleuritic pain, palpitaitons, heartburn, abdominal pain, melena, lower extremity edema, claudication, or rash.  All other systems reviewed and negative   General: Affect appropriate Healthy:  appears stated age HEENT: normal Neck supple with no adenopathy JVP normal no bruits no thyromegaly Lungs clear with no wheezing and good diaphragmatic motion Heart:  S1/S2 no murmur,rub, gallop or click PMI normal Abdomen: benighn, BS positve, no tenderness, no AAA no bruit.  No HSM or HJR Distal pulses intact with no bruits No edema Neuro non-focal Skin warm and dry No muscular weakness  Medications Current Outpatient Prescriptions  Medication Sig Dispense Refill  . Calcium Carbonate-Vitamin D (CALCIUM + D PO) Take by mouth daily.       . cloNIDine (CATAPRES) 0.1 MG tablet Take 0.1 mg by mouth 2 (two) times daily.        . Esomeprazole Magnesium (NEXIUM PO) Take by mouth daily.      Marland Kitchen estradiol (ESTRACE) 1 MG tablet Take 1.5 tablets (1.5 mg total) by mouth daily.  90 tablet  4  . Fexofenadine HCl (ALLEGRA PO) Take 180 mg by mouth daily.      Marland Kitchen gabapentin (NEURONTIN) 600 MG tablet Take 600 mg by mouth at bedtime.        Marland Kitchen ketotifen (ZADITOR) 0.025 % ophthalmic solution Place  1 drop into both eyes daily.      Marland Kitchen losartan (COZAAR) 50 MG tablet Take 50 mg by mouth daily.        . simvastatin (ZOCOR) 40 MG tablet Take 40 mg by mouth at bedtime.        . Vitamin D, Ergocalciferol, (DRISDOL) 50000 UNITS CAPS Take 50,000 Units by mouth every 7 (seven) days.        Allergies Erythromycin base; Morphine; and Vancomycin hcl  Family History: Family History  Problem Relation Age of Onset  . Hypertension Mother   . Diabetes Mother   . Cancer Father     colon  . Heart disease Father   . Hypertension Sister   . Cancer Sister     colon  . Cancer Paternal Aunt     colon  . Breast cancer Paternal Aunt   . Heart disease Maternal Grandfather   . Cancer Maternal Grandfather     Lymphoma  . Heart failure Maternal Grandfather   . Cancer Paternal Grandmother     colon    Social History: History   Social History  . Marital Status: Married    Spouse Name: N/A    Number of Children: N/A  . Years of Education: N/A   Occupational History  . Not on file.   Social History Main Topics  . Smoking status: Never Smoker   . Smokeless tobacco: Never Used  . Alcohol Use: Yes  Comment: Very rare  . Drug Use: No  . Sexually Active: Yes    Birth Control/ Protection: Surgical   Other Topics Concern  . Not on file   Social History Narrative  . No narrative on file    Electrocardiogram:  Dr Zachery Dauer office NSR NSR rate 75 normal  Assessment and Plan

## 2012-03-23 NOTE — Assessment & Plan Note (Signed)
Functional check echo normal exam and ECG unlikely to have heart issue

## 2012-03-23 NOTE — Assessment & Plan Note (Signed)
Well controlled.  Continue current medications and low sodium Dash type diet.    

## 2012-03-24 ENCOUNTER — Telehealth: Payer: Self-pay | Admitting: Cardiovascular Disease

## 2012-03-24 NOTE — Telephone Encounter (Signed)
Office note containing clearance faxed to the number provided. Left message for pt.

## 2012-03-24 NOTE — Telephone Encounter (Signed)
Pt was in yesterday and was to get surg clearance faxed to dr schooler, as of today not there, pls fax to [phone],  pls call pt when done [phone] ok to leave message

## 2012-04-01 ENCOUNTER — Other Ambulatory Visit (HOSPITAL_COMMUNITY): Payer: BC Managed Care – PPO

## 2012-04-01 ENCOUNTER — Ambulatory Visit (HOSPITAL_COMMUNITY): Payer: BC Managed Care – PPO | Attending: Cardiology | Admitting: Radiology

## 2012-04-01 DIAGNOSIS — R0600 Dyspnea, unspecified: Secondary | ICD-10-CM

## 2012-04-01 DIAGNOSIS — E785 Hyperlipidemia, unspecified: Secondary | ICD-10-CM | POA: Insufficient documentation

## 2012-04-01 DIAGNOSIS — E669 Obesity, unspecified: Secondary | ICD-10-CM | POA: Insufficient documentation

## 2012-04-01 DIAGNOSIS — I059 Rheumatic mitral valve disease, unspecified: Secondary | ICD-10-CM | POA: Insufficient documentation

## 2012-04-01 DIAGNOSIS — I517 Cardiomegaly: Secondary | ICD-10-CM | POA: Insufficient documentation

## 2012-04-01 DIAGNOSIS — Z8249 Family history of ischemic heart disease and other diseases of the circulatory system: Secondary | ICD-10-CM | POA: Insufficient documentation

## 2012-04-01 DIAGNOSIS — R0609 Other forms of dyspnea: Secondary | ICD-10-CM | POA: Insufficient documentation

## 2012-04-01 DIAGNOSIS — R0989 Other specified symptoms and signs involving the circulatory and respiratory systems: Secondary | ICD-10-CM

## 2012-04-01 NOTE — Progress Notes (Signed)
Echocardiogram performed.  

## 2012-04-06 ENCOUNTER — Ambulatory Visit (INDEPENDENT_AMBULATORY_CARE_PROVIDER_SITE_OTHER): Payer: BC Managed Care – PPO | Admitting: Gynecology

## 2012-04-06 ENCOUNTER — Encounter: Payer: Self-pay | Admitting: Gynecology

## 2012-04-06 VITALS — BP 144/86 | Ht 63.5 in | Wt 200.0 lb

## 2012-04-06 DIAGNOSIS — Z79890 Hormone replacement therapy (postmenopausal): Secondary | ICD-10-CM

## 2012-04-06 DIAGNOSIS — Z01419 Encounter for gynecological examination (general) (routine) without abnormal findings: Secondary | ICD-10-CM

## 2012-04-06 DIAGNOSIS — N951 Menopausal and female climacteric states: Secondary | ICD-10-CM

## 2012-04-06 MED ORDER — ESTRADIOL 1 MG PO TABS: 1.5000 mg | ORAL_TABLET | Freq: Every day | ORAL | Status: DC

## 2012-04-06 NOTE — Progress Notes (Signed)
Renee Flores [birth year] [id number]        46 y.o.  G2P2002 for annual exam.  Several issues noted below.  Past medical history,surgical history, medications, allergies, family history and social history were all reviewed and documented in the EPIC chart. ROS:  Was performed and pertinent positives and negatives are included in the history.  Exam: [name]:   04/06/12 1109  BP: 144/86  Height: 5' 3.5" (1.613 m)  Weight: 200 lb (90.719 kg)   General appearance  Normal Skin grossly normal Head/Neck normal with no cervical or supraclavicular adenopathy thyroid normal Lungs  clear Cardiac RR, without RMG Abdominal  soft, nontender, without masses, organomegaly or hernia Breasts  examined lying and sitting without masses, retractions, discharge or axillary adenopathy. Pelvic  Ext/BUS/vagina  normal   Adnexa  Without masses or tenderness    Anus and perineum  normal   Rectovaginal  normal sphincter tone without palpated masses or tenderness.    Assessment/Plan:  46 y.o. [id number] female for annual exam, status post TAH BSO.   1. HRT. Patient is on Estrace 1.5 mg daily. She still has some menopausal symptoms but overall doing well. Of significance she was recently diagnosed with factor V Leiden heterologous the patient. This was due to a family history of positivity based on brothers workup for blood clots. Patient is also being treated for hypertension. Does not smoke. I reviewed the significant issue of increased risk of clotting to include stroke heart attack DVT. I also reviewed the issues of breast cancer with ERT in the ACOG/NAMS statements the lowest dose for shortness period of time. She is younger at age 76 in the issue of quality of life of symptoms versus risk reviewed. Options for transdermal ERT and the whole issue of first pass effect decreased risk of thrombosis reviewed. She had tried patches previously and did not tolerate these both from a skin standpoint as  well as symptom relief. Sprays/lotions/transvaginal routes also discussed. After a very lengthy realistic discussion of risks the patient wants to continue accepts the risks of thrombosis including stroke heart attack DVT pulmonary embolus and I refilled her x1 year. 2. Mammography scheduled today.  Continue with annual mammography. SBE monthly reviewed. 3. Pap smear 2011. No Pap smear done today. No history of significant abnormal Pap smears previously.  Options to stop screening altogether she is status post abdominal hysterectomy for benign indications versus less frequent screening reviewed and we'll rediscuss annually. 4. Osteopenia 03/2011 DEXA with T score -1.8 FRAX 2.9%/0.4%. Patient is noted to be vitamin D deficient through her primary physician's blood work showing a vitamin D of 19. She is on extra vitamin D and will continue to do so. We will recheck her DEXA in another year or 2. 5. Hypertension. Blood pressure mildly elevated. The need to continue to follow this as an outpatient and follow up with her primary if remains elevated discussed. 6. Colonoscopy 04/2011. Follow up with their recommended interval. 7. Health maintenance. The blood work done this is all done through her primary physician's office who she sees on a regular basis. Follow up one year, sooner as needed.   Dara Lords MD, 11:39 AM 04/06/2012

## 2012-04-06 NOTE — Patient Instructions (Signed)
Follow up in one year for annual exam 

## 2012-04-08 ENCOUNTER — Encounter: Payer: Self-pay | Admitting: Gynecology

## 2012-04-09 HISTORY — PX: ESOPHAGEAL DILATION: SHX303

## 2012-04-13 ENCOUNTER — Ambulatory Visit: Payer: BC Managed Care – PPO | Admitting: Cardiology

## 2012-04-15 ENCOUNTER — Other Ambulatory Visit: Payer: Self-pay | Admitting: Gastroenterology

## 2012-04-28 HISTORY — PX: REPLACEMENT TOTAL KNEE: SUR1224

## 2012-05-05 ENCOUNTER — Ambulatory Visit: Payer: BC Managed Care – PPO | Admitting: Cardiology

## 2012-06-17 ENCOUNTER — Other Ambulatory Visit: Payer: Self-pay | Admitting: Orthopaedic Surgery

## 2012-06-28 ENCOUNTER — Encounter (HOSPITAL_COMMUNITY): Payer: Self-pay

## 2012-07-01 ENCOUNTER — Ambulatory Visit (HOSPITAL_COMMUNITY)
Admission: RE | Admit: 2012-07-01 | Discharge: 2012-07-01 | Disposition: A | Discharge status: Home / Self Care | Payer: BC Managed Care – PPO | Source: Ambulatory Visit | Attending: Orthopaedic Surgery | Admitting: Orthopaedic Surgery

## 2012-07-01 ENCOUNTER — Encounter (HOSPITAL_COMMUNITY)
Admission: RE | Admit: 2012-07-01 | Discharge: 2012-07-01 | Disposition: A | Discharge status: Home / Self Care | Payer: BC Managed Care – PPO | Source: Ambulatory Visit | Attending: Orthopaedic Surgery | Admitting: Orthopaedic Surgery

## 2012-07-01 ENCOUNTER — Encounter (HOSPITAL_COMMUNITY): Payer: Self-pay

## 2012-07-01 DIAGNOSIS — Z01812 Encounter for preprocedural laboratory examination: Secondary | ICD-10-CM | POA: Insufficient documentation

## 2012-07-01 DIAGNOSIS — Z01818 Encounter for other preprocedural examination: Secondary | ICD-10-CM | POA: Insufficient documentation

## 2012-07-01 DIAGNOSIS — Z01810 Encounter for preprocedural cardiovascular examination: Secondary | ICD-10-CM | POA: Insufficient documentation

## 2012-07-01 HISTORY — DX: Other complications of anesthesia, initial encounter: T88.59XA

## 2012-07-01 HISTORY — DX: Unspecified osteoarthritis, unspecified site: M19.90

## 2012-07-01 HISTORY — DX: Gastro-esophageal reflux disease without esophagitis: K21.9

## 2012-07-01 HISTORY — DX: Cardiac murmur, unspecified: R01.1

## 2012-07-01 HISTORY — DX: Adverse effect of unspecified anesthetic, initial encounter: T41.45XA

## 2012-07-01 LAB — URINALYSIS, ROUTINE W REFLEX MICROSCOPIC
Bilirubin Urine: NEGATIVE
Glucose, UA: NEGATIVE mg/dL
Ketones, ur: NEGATIVE mg/dL
Leukocytes, UA: NEGATIVE
Nitrite: NEGATIVE
Protein, ur: NEGATIVE mg/dL
Specific Gravity, Urine: 1.012 (ref 1.005–1.030)
Urobilinogen, UA: 0.2 mg/dL (ref 0.0–1.0)
pH: 6.5 (ref 5.0–8.0)

## 2012-07-01 LAB — TYPE AND SCREEN
ABO/RH(D): A NEG
Antibody Screen: NEGATIVE

## 2012-07-01 LAB — BASIC METABOLIC PANEL
BUN: 10 mg/dL (ref 6–23)
CO2: 27 mEq/L (ref 19–32)
Calcium: 9.1 mg/dL (ref 8.4–10.5)
Chloride: 102 mEq/L (ref 96–112)
Creatinine, Ser: 0.61 mg/dL (ref 0.50–1.10)
GFR calc Af Amer: >90 mL/min (ref >90)
GFR calc non Af Amer: >90 mL/min (ref >90)
Glucose, Bld: 84 mg/dL (ref 70–99)
Potassium: 3.6 mEq/L (ref 3.5–5.1)
Sodium: 140 mEq/L (ref 135–145)

## 2012-07-01 LAB — PROTIME-INR
INR: 0.9 (ref 0.00–1.49)
Prothrombin Time: 12.1 seconds (ref 11.6–15.2)

## 2012-07-01 LAB — CBC WITH DIFFERENTIAL/PLATELET
Basophils Absolute: 0 10*3/uL (ref 0.0–0.1)
Basophils Relative: 1 % (ref 0–1)
Eosinophils Absolute: 0.4 10*3/uL (ref 0.0–0.7)
Eosinophils Relative: 5 % (ref 0–5)
HCT: 36.2 % (ref 36.0–46.0)
Hemoglobin: 12.2 g/dL (ref 12.0–15.0)
Lymphocytes Relative: 40 % (ref 12–46)
Lymphs Abs: 3 10*3/uL (ref 0.7–4.0)
MCH: 29.3 pg (ref 26.0–34.0)
MCHC: 33.7 g/dL (ref 30.0–36.0)
MCV: 86.8 fL (ref 78.0–100.0)
Monocytes Absolute: 0.5 10*3/uL (ref 0.1–1.0)
Monocytes Relative: 6 % (ref 3–12)
Neutro Abs: 3.7 10*3/uL (ref 1.7–7.7)
Neutrophils Relative %: 49 % (ref 43–77)
Platelets: 262 10*3/uL (ref 150–400)
RBC: 4.17 MIL/uL (ref 3.87–5.11)
RDW: 12.3 % (ref 11.5–15.5)
WBC: 7.5 10*3/uL (ref 4.0–10.5)

## 2012-07-01 LAB — ABO/RH: ABO/RH(D): A NEG

## 2012-07-01 LAB — URINE MICROSCOPIC-ADD ON

## 2012-07-01 LAB — APTT: aPTT: 26 seconds (ref 24–37)

## 2012-07-01 LAB — SURGICAL PCR SCREEN
MRSA, PCR: NEGATIVE
Staphylococcus aureus: NEGATIVE

## 2012-07-01 MED ORDER — CHLORHEXIDINE GLUCONATE 4 % EX LIQD: 60.0000 mL | Freq: Once | CUTANEOUS | Status: DC

## 2012-07-01 NOTE — Pre-Procedure Instructions (Signed)
JAYLISSA FELTY  07/01/2012   Your procedure is scheduled on:  Thursday July 08, 2012  Report to [facility] at 0830 AM.  Call this number if you have problems the morning of surgery: [phone]   Remember:   Do not eat food or drink liquids after midnight.   Take these medicines the morning of surgery with A SIP OF WATER: Clonidine, Nexium, and Hydrocodone-Acetaminophen if needed, and Allegra if needed   Do not wear jewelry, make-up or nail polish.  Do not wear lotions, powders, or perfumes. You may wear deodorant.  Do not shave 48 hours prior to surgery.   Do not bring valuables to the hospital.  Contacts, dentures or bridgework may not be worn into surgery.  Leave suitcase in the car. After surgery it may be brought to your room.  For patients admitted to the hospital, checkout time is 11:00 AM the day of  discharge.   Patients discharged the day of surgery will not be allowed to drive  home.    Special Instructions: Incentive Spirometry - Practice and bring it with you on the day of surgery. Shower using CHG 2 nights before surgery and the night before surgery.  If you shower the day of surgery use CHG.  Use special wash - you have one bottle of CHG for all showers.  You should use approximately 1/3 of the bottle for each shower. N/A   Please read over the following fact sheets that you were given: Pain Booklet, Coughing and Deep Breathing, Blood Transfusion Information, MRSA Information and Surgical Site Infection Prevention

## 2012-07-01 NOTE — H&P (Signed)
TOTAL KNEE ADMISSION H&P  Patient is being admitted for right total knee arthroplasty.  Subjective:  Chief Complaint:right knee pain.  HPI: Renee Flores, 47 y.o. female, has a history of pain and functional disability in the right knee due to arthritis and has failed non-surgical conservative treatments for greater than 12 weeks to includeNSAID's and/or analgesics, corticosteriod injections, supervised PT with diminished ADL's post treatment, weight reduction as appropriate and activity modification.  Onset of symptoms was gradual, starting 7 years ago with gradually worsening course since that time. The patient noted prior procedures on the knee to include  ACL reconstruction on the right knee(s).  Patient currently rates pain in the right knee(s) at 9 out of 10 with activity. Patient has night pain, worsening of pain with activity and weight bearing, pain that interferes with activities of daily living, pain with passive range of motion and crepitus.  Patient has evidence of subchondral sclerosis, periarticular osteophytes and joint space narrowing by imaging studies. This patient has had acl recon and patella orif. There is no active infection.  Patient Active Problem List   Diagnosis Date Noted  . Preop cardiovascular exam 03/23/2012  . Dyspnea 03/23/2012  . Factor V deficiency, congenital 03/23/2012  . High cholesterol   . Osteopenia   . Vitamin d deficiency   . MS (multiple sclerosis)   . HYPERCHOLESTEROLEMIA 02/27/2010  . HYPERTENSION, HX OF 02/27/2010  . CELLULITIS AND ABSCESS OF LEG EXCEPT FOOT 01/26/2010   Past Medical History  Diagnosis Date  . Hypertension   . High cholesterol   . Osteopenia 03/2011    tscore -1.8 FRAX 2.9%/0.4%  . Vitamin D deficiency 2013    19  . MS (multiple sclerosis)     questionable  . Factor 5 Leiden mutation, heterozygous   . Heart palpitations   . Heart valve problem     mild- moderate mitral valve leakage     Past Surgical History   Procedure Laterality Date  . Pelvic laparoscopy      LSO  . Chloecystectomy    . Appendectomy    . Cesarean section      x2  . Tubal ligation    . Knee ligament reconstruction    . Laser throat polyps    . Neck lesion biopsy    . Patella fracture surgery    . Arthroscopic knee surgery    . Total knee arthroplasty  2012    Left  . Abdominal hysterectomy  2000    TAH RSO    No prescriptions prior to admission   Allergies  Allergen Reactions  . Doxycycline Nausea Only  . Erythromycin Base Hives  . Morphine Other (See Comments)    Hallucinating   . Vancomycin Hcl Other (See Comments)    Ringing in ears    History  Substance Use Topics  . Smoking status: Never Smoker   . Smokeless tobacco: Never Used  . Alcohol Use: No     Comment: Very rare    Family History  Problem Relation Age of Onset  . Hypertension Mother   . Diabetes Mother   . Hyperlipidemia Mother   . Cancer Father     colon polyps - precancerous  . Heart disease Father   . Hypertension Father   . Hyperlipidemia Father   . Hypertension Sister   . Cancer Sister     colon polyps - precancerous  . Cancer Paternal Aunt     colon  . Breast cancer Paternal Aunt   .  Heart disease Maternal Grandfather   . Cancer Maternal Grandfather     Lymphoma  . Heart failure Maternal Grandfather   . Cancer Paternal Grandmother     colon  . Hyperlipidemia Brother   . Hypertension Brother   . Hypertension Brother   . Cancer Paternal Aunt     colon     Review of Systems  Constitutional: Negative.   HENT: Negative.   Eyes: Negative.   Respiratory: Negative.   Cardiovascular: Negative.   Gastrointestinal: Negative.   Genitourinary: Negative.   Musculoskeletal: Positive for joint pain.  Skin: Negative.   Neurological: Negative.   Endo/Heme/Allergies:       Factor v deficient   Psychiatric/Behavioral: Negative.     Objective:  Physical Exam  Constitutional: She is oriented to person, place, and time.  She appears well-nourished.  HENT:  Head: Atraumatic.  Eyes: EOM are normal.  Neck: Neck supple.  Cardiovascular: Normal rate and regular rhythm.   Respiratory: Breath sounds normal.  GI: Bowel sounds are normal.  Musculoskeletal: She exhibits tenderness.  Right knee - +crepitation.  Medial joint line pain. rom-0-100 hxof acl recon and pat fx  Neurological: She is oriented to person, place, and time.  Skin: Skin is dry.  Psychiatric: She has a normal mood and affect.    Vital signs in last 24 hours:    Labs:   Estimated body mass index is 34.87 kg/(m^2) as calculated from the following:   Height as of 04/06/12: 5' 3.5" (1.613 m).   Weight as of 04/06/12: 90.719 kg (200 lb).   Imaging Review Plain radiographs demonstrate severe degenerative joint disease of the right knee(s). The overall alignment isneutral. The bone quality appears to be good for age and reported activity level.  Assessment/Plan:  End stage arthritis, right knee   The patient history, physical examination, clinical judgment of the provider and imaging studies are consistent with end stage degenerative joint disease of the right knee(s) and total knee arthroplasty is deemed medically necessary. The treatment options including medical management, injection therapy arthroscopy and arthroplasty were discussed at length. The risks and benefits of total knee arthroplasty were presented and reviewed. The risks due to aseptic loosening, infection, stiffness, patella tracking problems, thromboembolic complications and other imponderables were discussed. The patient acknowledged the explanation, agreed to proceed with the plan and consent was signed. Patient is being admitted for inpatient treatment for surgery, pain control, PT, OT, prophylactic antibiotics, VTE prophylaxis, progressive ambulation and ADL's and discharge planning. The patient is planning to be discharged home with home health services

## 2012-07-07 MED ORDER — LACTATED RINGERS IV SOLN: INTRAVENOUS | Status: DC

## 2012-07-07 MED ORDER — CEFAZOLIN SODIUM-DEXTROSE 2-3 GM-% IV SOLR
2.0000 g | INTRAVENOUS | Status: AC
  Administered 2012-07-08: 2 g via INTRAVENOUS
  Filled 2012-07-07: qty 50

## 2012-07-08 ENCOUNTER — Encounter (HOSPITAL_COMMUNITY): Payer: Self-pay | Admitting: Anesthesiology

## 2012-07-08 ENCOUNTER — Inpatient Hospital Stay (HOSPITAL_COMMUNITY): Payer: BC Managed Care – PPO | Admitting: Anesthesiology

## 2012-07-08 ENCOUNTER — Encounter (HOSPITAL_COMMUNITY)
Admission: RE | Disposition: A | Discharge status: Home Health | Payer: Self-pay | Source: Ambulatory Visit | Attending: Orthopaedic Surgery

## 2012-07-08 ENCOUNTER — Inpatient Hospital Stay (HOSPITAL_COMMUNITY)
Admission: RE | Admit: 2012-07-08 | Discharge: 2012-07-10 | DRG: 209 | Disposition: A | Discharge status: Home Health | Payer: BC Managed Care – PPO | Source: Ambulatory Visit | Attending: Orthopaedic Surgery | Admitting: Orthopaedic Surgery

## 2012-07-08 DIAGNOSIS — D6859 Other primary thrombophilia: Secondary | ICD-10-CM | POA: Diagnosis present

## 2012-07-08 DIAGNOSIS — Z7901 Long term (current) use of anticoagulants: Secondary | ICD-10-CM

## 2012-07-08 DIAGNOSIS — I059 Rheumatic mitral valve disease, unspecified: Secondary | ICD-10-CM | POA: Diagnosis present

## 2012-07-08 DIAGNOSIS — K219 Gastro-esophageal reflux disease without esophagitis: Secondary | ICD-10-CM | POA: Diagnosis present

## 2012-07-08 DIAGNOSIS — Z79899 Other long term (current) drug therapy: Secondary | ICD-10-CM

## 2012-07-08 DIAGNOSIS — E7800 Pure hypercholesterolemia, unspecified: Secondary | ICD-10-CM | POA: Diagnosis present

## 2012-07-08 DIAGNOSIS — F3289 Other specified depressive episodes: Secondary | ICD-10-CM | POA: Diagnosis present

## 2012-07-08 DIAGNOSIS — F329 Major depressive disorder, single episode, unspecified: Secondary | ICD-10-CM | POA: Diagnosis present

## 2012-07-08 DIAGNOSIS — Z96659 Presence of unspecified artificial knee joint: Secondary | ICD-10-CM

## 2012-07-08 DIAGNOSIS — M129 Arthropathy, unspecified: Secondary | ICD-10-CM | POA: Diagnosis present

## 2012-07-08 DIAGNOSIS — M1710 Unilateral primary osteoarthritis, unspecified knee: Principal | ICD-10-CM | POA: Diagnosis present

## 2012-07-08 DIAGNOSIS — I10 Essential (primary) hypertension: Secondary | ICD-10-CM | POA: Diagnosis present

## 2012-07-08 DIAGNOSIS — F411 Generalized anxiety disorder: Secondary | ICD-10-CM | POA: Diagnosis present

## 2012-07-08 DIAGNOSIS — M1711 Unilateral primary osteoarthritis, right knee: Secondary | ICD-10-CM

## 2012-07-08 DIAGNOSIS — G35 Multiple sclerosis: Secondary | ICD-10-CM | POA: Diagnosis present

## 2012-07-08 HISTORY — DX: Unilateral primary osteoarthritis, right knee: M17.11

## 2012-07-08 HISTORY — PX: TOTAL KNEE ARTHROPLASTY: SHX125

## 2012-07-08 SURGERY — ARTHROPLASTY, KNEE, TOTAL
Anesthesia: Regional | Site: Knee | Laterality: Right | Wound class: Clean

## 2012-07-08 MED ORDER — HYDROMORPHONE HCL PF 1 MG/ML IJ SOLN: 0.2500 mg | INTRAMUSCULAR | Status: DC | PRN

## 2012-07-08 MED ORDER — LACTATED RINGERS IV SOLN
INTRAVENOUS | Status: DC | PRN
  Administered 2012-07-08 (×2): via INTRAVENOUS

## 2012-07-08 MED ORDER — BUPIVACAINE HCL (PF) 0.75 % IJ SOLN
INTRAMUSCULAR | Status: DC | PRN
  Administered 2012-07-08: 1.6 mL via INTRATHECAL

## 2012-07-08 MED ORDER — ONDANSETRON HCL 4 MG/2ML IJ SOLN
INTRAMUSCULAR | Status: DC | PRN
  Administered 2012-07-08: 4 mg via INTRAVENOUS

## 2012-07-08 MED ORDER — POTASSIUM GLUCONATE 550 MG PO TABS: 1.0000 | ORAL_TABLET | Freq: Two times a day (BID) | ORAL | Status: DC

## 2012-07-08 MED ORDER — PHENYLEPHRINE HCL 10 MG/ML IJ SOLN
INTRAMUSCULAR | Status: DC | PRN
  Administered 2012-07-08: 80 ug via INTRAVENOUS

## 2012-07-08 MED ORDER — PATIENT'S GUIDE TO USING COUMADIN BOOK
Freq: Once | Status: AC
  Administered 2012-07-08: 16:00:00
  Filled 2012-07-08: qty 1

## 2012-07-08 MED ORDER — PROPOFOL INFUSION 10 MG/ML OPTIME
INTRAVENOUS | Status: DC | PRN
  Administered 2012-07-08: 100 ug/kg/min via INTRAVENOUS

## 2012-07-08 MED ORDER — CEFAZOLIN SODIUM-DEXTROSE 2-3 GM-% IV SOLR
2.0000 g | Freq: Four times a day (QID) | INTRAVENOUS | Status: AC
  Administered 2012-07-08 (×2): 2 g via INTRAVENOUS
  Filled 2012-07-08 (×3): qty 50

## 2012-07-08 MED ORDER — CLONIDINE HCL 0.1 MG PO TABS
0.1000 mg | ORAL_TABLET | Freq: Two times a day (BID) | ORAL | Status: DC
Start: 2012-07-08 — End: 2012-07-10
  Administered 2012-07-08 – 2012-07-10 (×4): 0.1 mg via ORAL
  Filled 2012-07-08 (×5): qty 1

## 2012-07-08 MED ORDER — OLOPATADINE HCL 0.1 % OP SOLN
2.0000 [drp] | Freq: Two times a day (BID) | OPHTHALMIC | Status: DC
  Administered 2012-07-08 – 2012-07-10 (×4): 2 [drp] via OPHTHALMIC
  Filled 2012-07-08: qty 5

## 2012-07-08 MED ORDER — DOCUSATE SODIUM 100 MG PO CAPS
100.0000 mg | ORAL_CAPSULE | Freq: Two times a day (BID) | ORAL | Status: DC
  Administered 2012-07-08 – 2012-07-10 (×4): 100 mg via ORAL
  Filled 2012-07-08 (×4): qty 1

## 2012-07-08 MED ORDER — PHENOL 1.4 % MT LIQD: 1.0000 | OROMUCOSAL | Status: DC | PRN

## 2012-07-08 MED ORDER — PANTOPRAZOLE SODIUM 40 MG PO TBEC
40.0000 mg | DELAYED_RELEASE_TABLET | Freq: Every day | ORAL | Status: DC
  Administered 2012-07-09 – 2012-07-10 (×2): 40 mg via ORAL
  Filled 2012-07-08 (×2): qty 1

## 2012-07-08 MED ORDER — FLUTICASONE PROPIONATE 50 MCG/ACT NA SUSP
2.0000 | Freq: Every day | NASAL | Status: DC
  Administered 2012-07-08 – 2012-07-10 (×3): 2 via NASAL
  Filled 2012-07-08: qty 16

## 2012-07-08 MED ORDER — FENTANYL CITRATE 0.05 MG/ML IJ SOLN
INTRAMUSCULAR | Status: DC | PRN
  Administered 2012-07-08: 50 ug via INTRAVENOUS
  Administered 2012-07-08: 100 ug via INTRAVENOUS
  Administered 2012-07-08: 50 ug via INTRAVENOUS

## 2012-07-08 MED ORDER — HYDROMORPHONE HCL PF 1 MG/ML IJ SOLN
0.5000 mg | INTRAMUSCULAR | Status: DC | PRN
  Administered 2012-07-08 (×2): 1 mg via INTRAVENOUS
  Filled 2012-07-08 (×2): qty 1

## 2012-07-08 MED ORDER — BUPIVACAINE-EPINEPHRINE PF 0.5-1:200000 % IJ SOLN
INTRAMUSCULAR | Status: DC | PRN
  Administered 2012-07-08: 30 mL

## 2012-07-08 MED ORDER — KETOTIFEN FUMARATE 0.025 % OP SOLN: 2.0000 [drp] | Freq: Two times a day (BID) | OPHTHALMIC | Status: DC

## 2012-07-08 MED ORDER — LOSARTAN POTASSIUM 50 MG PO TABS
50.0000 mg | ORAL_TABLET | Freq: Two times a day (BID) | ORAL | Status: DC
  Administered 2012-07-08 – 2012-07-10 (×4): 50 mg via ORAL
  Filled 2012-07-08 (×5): qty 1

## 2012-07-08 MED ORDER — WARFARIN SODIUM 7.5 MG PO TABS
7.5000 mg | ORAL_TABLET | Freq: Every day | ORAL | Status: DC
  Administered 2012-07-08 – 2012-07-09 (×2): 7.5 mg via ORAL
  Filled 2012-07-08 (×4): qty 1

## 2012-07-08 MED ORDER — ALUM & MAG HYDROXIDE-SIMETH 200-200-20 MG/5ML PO SUSP: 30.0000 mL | ORAL | Status: DC | PRN

## 2012-07-08 MED ORDER — SODIUM CHLORIDE 0.9 % IR SOLN
Status: DC | PRN
  Administered 2012-07-08: 3000 mL

## 2012-07-08 MED ORDER — BISACODYL 10 MG RE SUPP: 10.0000 mg | Freq: Every day | RECTAL | Status: DC | PRN

## 2012-07-08 MED ORDER — DIPHENHYDRAMINE HCL 12.5 MG/5ML PO ELIX: 12.5000 mg | ORAL_SOLUTION | ORAL | Status: DC | PRN

## 2012-07-08 MED ORDER — METHOCARBAMOL 500 MG PO TABS
500.0000 mg | ORAL_TABLET | Freq: Four times a day (QID) | ORAL | Status: DC | PRN
  Administered 2012-07-08 – 2012-07-09 (×4): 500 mg via ORAL
  Filled 2012-07-08 (×4): qty 1

## 2012-07-08 MED ORDER — LACTATED RINGERS IV SOLN
INTRAVENOUS | Status: DC
  Administered 2012-07-08: 50 mL/h via INTRAVENOUS

## 2012-07-08 MED ORDER — ACETAMINOPHEN 325 MG PO TABS: 650.0000 mg | ORAL_TABLET | Freq: Four times a day (QID) | ORAL | Status: DC | PRN

## 2012-07-08 MED ORDER — MIDAZOLAM HCL 2 MG/2ML IJ SOLN
INTRAMUSCULAR | Status: AC
  Filled 2012-07-08: qty 2

## 2012-07-08 MED ORDER — MENTHOL 3 MG MT LOZG: 1.0000 | LOZENGE | OROMUCOSAL | Status: DC | PRN

## 2012-07-08 MED ORDER — FENTANYL CITRATE 0.05 MG/ML IJ SOLN
INTRAMUSCULAR | Status: AC
  Filled 2012-07-08: qty 2

## 2012-07-08 MED ORDER — WARFARIN - PHARMACIST DOSING INPATIENT: Freq: Every day | Status: DC

## 2012-07-08 MED ORDER — GABAPENTIN 600 MG PO TABS
600.0000 mg | ORAL_TABLET | Freq: Every day | ORAL | Status: DC
  Administered 2012-07-08 – 2012-07-09 (×2): 600 mg via ORAL
  Filled 2012-07-08 (×3): qty 1

## 2012-07-08 MED ORDER — MIDAZOLAM HCL 5 MG/5ML IJ SOLN
INTRAMUSCULAR | Status: DC | PRN
  Administered 2012-07-08: 2 mg via INTRAVENOUS

## 2012-07-08 MED ORDER — LACTATED RINGERS IV SOLN: INTRAVENOUS | Status: DC

## 2012-07-08 MED ORDER — LORATADINE 10 MG PO TABS
10.0000 mg | ORAL_TABLET | Freq: Every day | ORAL | Status: DC
  Administered 2012-07-08 – 2012-07-10 (×3): 10 mg via ORAL
  Filled 2012-07-08 (×3): qty 1

## 2012-07-08 MED ORDER — HYDROCODONE-ACETAMINOPHEN 7.5-325 MG PO TABS
1.0000 | ORAL_TABLET | ORAL | Status: DC | PRN
  Administered 2012-07-08 – 2012-07-10 (×8): 2 via ORAL
  Filled 2012-07-08 (×8): qty 2

## 2012-07-08 MED ORDER — ESTRADIOL 1 MG PO TABS
1.5000 mg | ORAL_TABLET | Freq: Every day | ORAL | Status: DC
  Administered 2012-07-08 – 2012-07-10 (×3): 1.5 mg via ORAL
  Filled 2012-07-08 (×3): qty 1.5

## 2012-07-08 MED ORDER — ONDANSETRON HCL 4 MG/2ML IJ SOLN: 4.0000 mg | Freq: Four times a day (QID) | INTRAMUSCULAR | Status: DC | PRN

## 2012-07-08 MED ORDER — DEXTROSE 5 % IV SOLN: 500.0000 mg | Freq: Four times a day (QID) | INTRAVENOUS | Status: DC | PRN

## 2012-07-08 MED ORDER — ACETAMINOPHEN 650 MG RE SUPP: 650.0000 mg | Freq: Four times a day (QID) | RECTAL | Status: DC | PRN

## 2012-07-08 MED ORDER — WARFARIN VIDEO: Freq: Once | Status: DC

## 2012-07-08 MED ORDER — PROPOFOL 10 MG/ML IV BOLUS
INTRAVENOUS | Status: DC | PRN
  Administered 2012-07-08: 30 mg via INTRAVENOUS

## 2012-07-08 MED ORDER — SIMVASTATIN 40 MG PO TABS
40.0000 mg | ORAL_TABLET | Freq: Every day | ORAL | Status: DC
  Administered 2012-07-08 – 2012-07-09 (×2): 40 mg via ORAL
  Filled 2012-07-08 (×3): qty 1

## 2012-07-08 SURGICAL SUPPLY — 60 items
BANDAGE ELASTIC 4 VELCRO ST LF (GAUZE/BANDAGES/DRESSINGS) ×2 IMPLANT
BANDAGE ELASTIC 6 VELCRO ST LF (GAUZE/BANDAGES/DRESSINGS) ×2 IMPLANT
BANDAGE ESMARK 6X9 LF (GAUZE/BANDAGES/DRESSINGS) ×1 IMPLANT
BANDAGE GAUZE ELAST BULKY 4 IN (GAUZE/BANDAGES/DRESSINGS) ×2 IMPLANT
BLADE SAGITTAL 25.0X1.19X90 (BLADE) ×2 IMPLANT
BLADE SURG ROTATE 9660 (MISCELLANEOUS) IMPLANT
BNDG ELASTIC 6X10 VLCR STRL LF (GAUZE/BANDAGES/DRESSINGS) ×2 IMPLANT
BNDG ESMARK 6X9 LF (GAUZE/BANDAGES/DRESSINGS) ×2
BOWL SMART MIX CTS (DISPOSABLE) ×2 IMPLANT
CEMENT HV SMART SET (Cement) ×4 IMPLANT
CLOTH BEACON ORANGE TIMEOUT ST (SAFETY) ×2 IMPLANT
COVER SURGICAL LIGHT HANDLE (MISCELLANEOUS) ×2 IMPLANT
CUFF TOURNIQUET SINGLE 34IN LL (TOURNIQUET CUFF) ×2 IMPLANT
CUFF TOURNIQUET SINGLE 44IN (TOURNIQUET CUFF) IMPLANT
DRAPE EXTREMITY T 121X128X90 (DRAPE) ×2 IMPLANT
DRAPE PROXIMA HALF (DRAPES) ×2 IMPLANT
DRAPE U-SHAPE 47X51 STRL (DRAPES) ×2 IMPLANT
DRSG ADAPTIC 3X8 NADH LF (GAUZE/BANDAGES/DRESSINGS) ×2 IMPLANT
DRSG PAD ABDOMINAL 8X10 ST (GAUZE/BANDAGES/DRESSINGS) ×2 IMPLANT
DURAPREP 26ML APPLICATOR (WOUND CARE) ×2 IMPLANT
ELECT REM PT RETURN 9FT ADLT (ELECTROSURGICAL) ×2
ELECTRODE REM PT RTRN 9FT ADLT (ELECTROSURGICAL) ×1 IMPLANT
FACESHIELD LNG OPTICON STERILE (SAFETY) ×4 IMPLANT
GLOVE BIO SURGEON STRL SZ8.5 (GLOVE) ×2 IMPLANT
GLOVE BIOGEL PI IND STRL 8 (GLOVE) ×1 IMPLANT
GLOVE BIOGEL PI IND STRL 8.5 (GLOVE) ×1 IMPLANT
GLOVE BIOGEL PI INDICATOR 8 (GLOVE) ×1
GLOVE BIOGEL PI INDICATOR 8.5 (GLOVE) ×1
GLOVE SS BIOGEL STRL SZ 8 (GLOVE) ×1 IMPLANT
GLOVE SUPERSENSE BIOGEL SZ 8 (GLOVE) ×1
GOWN PREVENTION PLUS XLARGE (GOWN DISPOSABLE) ×2 IMPLANT
GOWN PREVENTION PLUS XXLARGE (GOWN DISPOSABLE) ×2 IMPLANT
GOWN STRL NON-REIN LRG LVL3 (GOWN DISPOSABLE) ×2 IMPLANT
HANDPIECE INTERPULSE COAX TIP (DISPOSABLE) ×1
HOOD PEEL AWAY FACE SHEILD DIS (HOOD) ×2 IMPLANT
IMMOBILIZER KNEE 20 (SOFTGOODS)
IMMOBILIZER KNEE 20 THIGH 36 (SOFTGOODS) IMPLANT
IMMOBILIZER KNEE 22 UNIV (SOFTGOODS) ×2 IMPLANT
IMMOBILIZER KNEE 24 THIGH 36 (MISCELLANEOUS) IMPLANT
IMMOBILIZER KNEE 24 UNIV (MISCELLANEOUS)
KIT BASIN OR (CUSTOM PROCEDURE TRAY) ×2 IMPLANT
KIT ROOM TURNOVER OR (KITS) ×2 IMPLANT
MANIFOLD NEPTUNE II (INSTRUMENTS) ×2 IMPLANT
NS IRRIG 1000ML POUR BTL (IV SOLUTION) ×2 IMPLANT
PACK TOTAL JOINT (CUSTOM PROCEDURE TRAY) ×2 IMPLANT
PAD ARMBOARD 7.5X6 YLW CONV (MISCELLANEOUS) ×4 IMPLANT
SET HNDPC FAN SPRY TIP SCT (DISPOSABLE) ×1 IMPLANT
SPONGE GAUZE 4X4 12PLY (GAUZE/BANDAGES/DRESSINGS) ×2 IMPLANT
STAPLER VISISTAT 35W (STAPLE) ×2 IMPLANT
SUCTION FRAZIER TIP 10 FR DISP (SUCTIONS) ×2 IMPLANT
SUT MNCRL AB 3-0 PS2 18 (SUTURE) ×2 IMPLANT
SUT VIC AB 0 CT1 27 (SUTURE) ×2
SUT VIC AB 0 CT1 27XBRD ANBCTR (SUTURE) ×2 IMPLANT
SUT VIC AB 2-0 CT1 27 (SUTURE) ×2
SUT VIC AB 2-0 CT1 TAPERPNT 27 (SUTURE) ×2 IMPLANT
SUT VLOC 180 0 24IN GS25 (SUTURE) ×2 IMPLANT
TOWEL OR 17X24 6PK STRL BLUE (TOWEL DISPOSABLE) ×2 IMPLANT
TOWEL OR 17X26 10 PK STRL BLUE (TOWEL DISPOSABLE) ×2 IMPLANT
TRAY FOLEY CATH 14FR (SET/KITS/TRAYS/PACK) ×2 IMPLANT
WATER STERILE IRR 1000ML POUR (IV SOLUTION) ×4 IMPLANT

## 2012-07-08 NOTE — Anesthesia Preprocedure Evaluation (Addendum)
Anesthesia Evaluation  Patient identified by MRN, date of birth, ID band Patient awake    Reviewed: Allergy & Precautions, H&P , NPO status , Patient's Chart, lab work & pertinent test results  Airway Mallampati: II TM Distance: >3 FB Neck ROM: Full    Dental  (+) Teeth Intact and Dental Advisory Given   Pulmonary shortness of breath and with exertion,          Cardiovascular hypertension, Pt. on medications + Valvular Problems/Murmurs Rhythm:Regular Rate:Normal     Neuro/Psych Anxiety Depression Multiple Sclerosis.  Pt states she has Right sided weakness. Ambulates w/o diff.  Neuromuscular disease    GI/Hepatic Neg liver ROS, GERD-  Medicated and Controlled,  Endo/Other    Renal/GU negative Renal ROS     Musculoskeletal  (+) Arthritis -, Osteoarthritis,    Abdominal (+) + obese,   Peds  Hematology  (+) Blood dyscrasia, , Factor lV deficiency   Anesthesia Other Findings Teeth intact, however discolored  Reproductive/Obstetrics negative OB ROS                         Anesthesia Physical Anesthesia Plan  ASA: II  Anesthesia Plan: Spinal and Regional   Post-op Pain Management: MAC Combined w/ Regional for Post-op pain   Induction: Intravenous  Airway Management Planned: Nasal Cannula  Additional Equipment:   Intra-op Plan:   Post-operative Plan:   Informed Consent: I have reviewed the patients History and Physical, chart, labs and discussed the procedure including the risks, benefits and alternatives for the proposed anesthesia with the patient or authorized representative who has indicated his/her understanding and acceptance.   Dental advisory given and History available from chart only  Plan Discussed with: CRNA and Anesthesiologist  Anesthesia Plan Comments:        Anesthesia Quick Evaluation

## 2012-07-08 NOTE — Progress Notes (Signed)
Pt has good sensation to lower extremities, is able to bend left leg, right is in CPM, able to wiggle right toes well. She has been inc. Of urine twice in the PACU (couldn't  tell she was going_

## 2012-07-08 NOTE — Progress Notes (Signed)
PHARMACIST - PHYSICIAN ORDER COMMUNICATION  CONCERNING: P&T Medication Policy on Herbal Medications  DESCRIPTION:  This patient's order for:  Potassium gluconate  has been noted.  This product(s) is classified as an "herbal" or natural product. Due to a lack of definitive safety studies or FDA approval, nonstandard manufacturing practices, plus the potential risk of unknown drug-drug interactions while on inpatient medications, the Pharmacy and Therapeutics Committee does not permit the use of "herbal" or natural products of this type within .   ACTION TAKEN: The pharmacy department is unable to verify this order at this time and your patient has been informed of this safety policy. Please reevaluate patient's clinical condition at discharge and address if the herbal or natural product(s) should be resumed at that time.  

## 2012-07-08 NOTE — Progress Notes (Signed)
Orthopedic Tech Progress Note Patient Details:  Renee Flores 1965/05/28 [id number]  CPM Right Knee CPM Right Knee: On Right Knee Flexion (Degrees): 60 Right Knee Extension (Degrees): 0 Additional Comments: trapeze bar   Cammer, Mickie Bail 07/08/2012, 1:53 PM

## 2012-07-08 NOTE — Progress Notes (Signed)
Pt voided very small amount of urine in bedpan but was incontinent in bed for urine

## 2012-07-08 NOTE — Transfer of Care (Signed)
Immediate Anesthesia Transfer of Care Note  Patient: Renee Flores  Procedure(s) Performed: Procedure(s) with comments: TOTAL KNEE ARTHROPLASTY with revision components  (Right) - PATIENT HAS HX. OF FACTOR IV DISORFER  Patient Location: PACU  Anesthesia Type:MAC and MAC combined with regional for post-op pain  Level of Consciousness: awake, alert , oriented and patient cooperative  Airway & Oxygen Therapy: Patient Spontanous Breathing and Patient connected to nasal cannula oxygen  Post-op Assessment: Report given to PACU RN and Post -op Vital signs reviewed and stable  Post vital signs: Reviewed and stable  Complications: No apparent anesthesia complications

## 2012-07-08 NOTE — Anesthesia Procedure Notes (Addendum)
Anesthesia Regional Block:  Femoral nerve block  Pre-Anesthetic Checklist: ,, timeout performed, Correct Patient, Correct Site, Correct Laterality, Correct Procedure,, site marked, risks and benefits discussed, Surgical consent,  Pre-op evaluation,  At surgeon's request and post-op pain management  Laterality: Right  Prep: chloraprep       Needles:  Injection technique: Single-shot  Needle Type: Echogenic Stimulator Needle     Needle Length: 9cm  Needle Gauge: 21    Additional Needles:  Procedures: nerve stimulator Femoral nerve block  Nerve Stimulator or Paresthesia:  Response: Quadriceps muscle contraction, 0.45 mA,   Additional Responses:   Narrative:  Start time: 07/08/2012 10:01 AM End time: 07/08/2012 10:13 AM Injection made incrementally with aspirations every 5 mL.  Performed by: Personally  Anesthesiologist: Dr Chaney Malling  Additional Notes: Functioning IV was confirmed and monitors were applied.  A 90mm 21ga Arrow echogenic stimulator needle was used. Sterile prep and drape,hand hygiene and sterile gloves were used.  Negative aspiration and negative test dose prior to incremental administration of local anesthetic. The patient tolerated the procedure well.    Femoral nerve block Spinal  Patient location during procedure: OR Start time: 07/08/2012 10:53 AM End time: 07/08/2012 11:01 AM Staffing Anesthesiologist: Chaney Malling, ADAM S Performed by: anesthesiologist  Preanesthetic Checklist Completed: patient identified, site marked, surgical consent, pre-op evaluation, timeout performed, IV checked, risks and benefits discussed and monitors and equipment checked Spinal Block Patient position: sitting Prep: Betadine and site prepped and draped Patient monitoring: heart rate, cardiac monitor, continuous pulse ox and blood pressure Approach: midline Location: L3-4 Injection technique: single-shot Needle Needle type: Quincke  Needle gauge: 25 G Needle length: 9  cm Assessment Sensory level: T8 Additional Notes Pt tolerated the procedure well.  1.6 mL of 0.75% Bupivacaine injected into subarachnoid space.  Procedure Name: MAC Date/Time: 07/08/2012 11:05 AM Performed by: Tyrone Nine Pre-anesthesia Checklist: Patient identified, Emergency Drugs available, Suction available, Patient being monitored and Timeout performed Oxygen Delivery Method: Nasal cannula Intubation Type: IV induction

## 2012-07-08 NOTE — Interval H&P Note (Signed)
History and Physical Interval Note:  07/08/2012 10:45 AM  Renee Flores  has presented today for surgery, with the diagnosis of RIGHT KNEE DEGENERATIVE JOINT DISEASE  The various methods of treatment have been discussed with the patient and family. After consideration of risks, benefits and other options for treatment, the patient has consented to  Procedure(s) with comments: TOTAL KNEE ARTHROPLASTY (Right) - NEEDS LINVOTEC SCREW DRIVER FROM ACL SET. PATIENT HAS HX. OF FACTOR IV DISORFER as a surgical intervention .  The patient's history has been reviewed, patient examined, no change in status, stable for surgery.  I have reviewed the patient's chart and labs.  Questions were answered to the patient's satisfaction.     Ary Lavine G

## 2012-07-08 NOTE — Progress Notes (Signed)
ANTICOAGULATION CONSULT NOTE - Initial Consult  Pharmacy Consult for Coumadin Indication: VTE prophylaxis  Allergies  Allergen Reactions  . Doxycycline Nausea Only  . Erythromycin Base Hives  . Morphine Other (See Comments)    Hallucinating   . Vancomycin Hcl Other (See Comments)    Ringing in ears    Medical History: Past Medical History  Diagnosis Date  . Hypertension   . High cholesterol   . Osteopenia 03/2011    tscore -1.8 FRAX 2.9%/0.4%  . Vitamin D deficiency 2013    19  . MS (multiple sclerosis)     questionable  . Factor 5 Leiden mutation, heterozygous   . Heart palpitations   . Heart valve problem     mild- moderate mitral valve leakage   . Complication of anesthesia     anxiety attack after waking up from anesthesia  . Heart murmur   . Arthritis   . GERD (gastroesophageal reflux disease)    Assessment: 47 year old s/p TKA beginning Coumadin for VTE prophylaxis  Goal of Therapy:  INR 2-3 Monitor platelets by anticoagulation protocol: Yes   Plan:  1) Coumadin 7.5 mg po daily at 1800 pm 2) Daily INR 3) Coumadin book / video  Thank you. Okey Regal, PharmD [phone]  07/08/2012,3:22 PM

## 2012-07-08 NOTE — Progress Notes (Signed)
Lunch relief by Bethel Born  RN

## 2012-07-08 NOTE — Preoperative (Signed)
Beta Blockers   Reason not to administer Beta Blockers:Not Applicable 

## 2012-07-08 NOTE — Op Note (Signed)
PREOP DIAGNOSIS: DJD RIGHT KNEE POSTOP DIAGNOSIS: DJD RIGHT KNEE PROCEDURE: RIGHT TKR and removal hardware right knee ANESTHESIA: General and block ATTENDING SURGEON: Calvary Difranco G ASSISTANTLindwood Qua PA  INDICATIONS FOR PROCEDURE: Renee Flores is a 47 y.o. female who has struggled for a long time with pain due to degenerative arthritis of the right knee.  The patient has failed many conservative non-operative measures and at this point has pain which limits the ability to sleep and walk. She has history of patellar ORIF and ACL recon of this knee in the past.  The patient is offered total knee replacement.  Informed operative consent was obtained after discussion of possible risks of anesthesia, infection, neurovascular injury, DVT, and death.  The importance of the post-operative rehabilitation protocol to optimize result was stressed extensively with the patient.  SUMMARY OF FINDINGS AND PROCEDURE:  Renee Flores was taken to the operative suite where under the above anesthesia a right knee replacement was performed.  There were advanced degenerative changes and the bone quality was excellent.  We used the DePuy system and placed size standard femur, tibia, 35 mm all polyethylene patella, and a size 15 mm spacer.  We removed the tibial interference screw with some moderate difficulty. The patient was admitted for appropriate post-op care to include perioperative antibiotics and mechanical and pharmacologic measures for DVT prophylaxis.  DESCRIPTION OF PROCEDURE:  Renee Flores was taken to the operative suite where the above anesthesia was applied.  The patient was positioned supine and prepped and draped in normal sterile fashion.  An appropriate time out was performed.  After the administration of Kefzol pre-op antibiotic the leg was elevated and exsanguinated and a tourniquet inflated. A standard longitudinal incision was made on the anterior knee.  Dissection was carried down to the  extensor mechanism.  All appropriate anti-infective measures were used including the pre-operative antibiotic, betadine impregnated drape, and closed hooded exhaust systems for each member of the surgical team.  A medial parapatellar incision was made in the extensor mechanism and the knee cap flipped and the knee flexed.  Some residual meniscal tissues were removed along with any remaining ACL/PCL tissue.  A guide was placed on the tibia and a flat cut was made on it's superior surface.  We found the tibial interference screw from her old ACL recon and it was removed with mild difficulty through the tibial tunnel anteromedial.  An intramedullary guide was placed in the femur and was utilized to make anterior and posterior cuts creating an appropriate flexion gap.  A second intramedullary guide was placed in the femur to make a distal cut properly balancing the knee with an extension gap equal to the flexion gap.  The three bones sized to the above mentioned sizes and the appropriate guides were placed and utilized.  A trial reduction was done and the knee easily came to full extension and the patella tracked well on flexion.  The trial components were removed and all bones were cleaned with pulsatile lavage and then dried thoroughly.  Cement was mixed and was pressurized onto the bones followed by placement of the aforementioned components.  Excess cement was trimmed and pressure was held on the components until the cement had hardened.  The tourniquet was deflated and a small amount of bleeding was controlled with cautery and pressure.  The knee was irrigated thoroughly.  The extensor mechanism was re-approximated with V-loc suture in running fashion.  The knee was flexed and the repair  was solid.  The subcutaneous tissues were re-approximated with #0 and #2-0 vicryl and the skin closed with a subcuticular stitch and steristrips.  A sterile dressing was applied.  Intraoperative fluids, EBL, and tourniquet time can  be obtained from anesthesia records.  DISPOSITION:  The patient was taken to recovery room in stable condition and admitted for appropriate post-op care to include peri-operative antibiotic and DVT prophylaxis with mechanical and pharmacologic measures.  Renee Flores G 07/08/2012, 12:46 PM

## 2012-07-09 LAB — CBC
HCT: 32.1 % — ABNORMAL LOW (ref 36.0–46.0)
Hemoglobin: 10.9 g/dL — ABNORMAL LOW (ref 12.0–15.0)
MCH: 29.6 pg (ref 26.0–34.0)
MCHC: 34 g/dL (ref 30.0–36.0)
MCV: 87.2 fL (ref 78.0–100.0)
Platelets: 218 10*3/uL (ref 150–400)
RBC: 3.68 MIL/uL — ABNORMAL LOW (ref 3.87–5.11)
RDW: 12.3 % (ref 11.5–15.5)
WBC: 8.7 10*3/uL (ref 4.0–10.5)

## 2012-07-09 LAB — BASIC METABOLIC PANEL
BUN: 12 mg/dL (ref 6–23)
CO2: 27 mEq/L (ref 19–32)
Calcium: 9.2 mg/dL (ref 8.4–10.5)
Chloride: 101 mEq/L (ref 96–112)
Creatinine, Ser: 0.64 mg/dL (ref 0.50–1.10)
GFR calc Af Amer: >90 mL/min (ref >90)
GFR calc non Af Amer: >90 mL/min (ref >90)
Glucose, Bld: 108 mg/dL — ABNORMAL HIGH (ref 70–99)
Potassium: 3.9 mEq/L (ref 3.5–5.1)
Sodium: 138 mEq/L (ref 135–145)

## 2012-07-09 LAB — PROTIME-INR
INR: 1.06 (ref 0.00–1.49)
Prothrombin Time: 13.7 seconds (ref 11.6–15.2)

## 2012-07-09 MED ORDER — METHOCARBAMOL 500 MG PO TABS: 500.0000 mg | ORAL_TABLET | Freq: Four times a day (QID) | ORAL | Status: DC | PRN

## 2012-07-09 MED ORDER — HYDROCODONE-ACETAMINOPHEN 5-325 MG PO TABS: 1.0000 | ORAL_TABLET | ORAL | Status: DC | PRN

## 2012-07-09 MED ORDER — WARFARIN SODIUM 5 MG PO TABS: 5.0000 mg | ORAL_TABLET | Freq: Every day | ORAL | Status: DC

## 2012-07-09 NOTE — Anesthesia Postprocedure Evaluation (Signed)
  Anesthesia Post-op Note  Patient: Renee Flores  Procedure(s) Performed: Procedure(s) with comments: TOTAL KNEE ARTHROPLASTY with revision components  (Right) - PATIENT HAS HX. OF FACTOR IV DISORFER  Patient Location: Nursing Unit  Anesthesia Type:MAC and Regional  Level of Consciousness: awake, alert  and oriented  Airway and Oxygen Therapy: Patient Spontanous Breathing  Post-op Pain: moderate  Post-op Assessment: Post-op Vital signs reviewed  Post-op Vital Signs: stable  Complications: No apparent anesthesia complications

## 2012-07-09 NOTE — Progress Notes (Signed)
CARE MANAGEMENT NOTE 07/09/2012  Patient:  Renee Flores, Renee Flores   Account Number:  [id number]  Date Initiated:  07/09/2012  Documentation initiated by:  Vance Peper  Subjective/Objective Assessment:   47 yr old female s/p right total knee arthroplasty.     Action/Plan:   patient preoperatively setup with [facility], no changes. Patient has Zrolling walker, 3in1 and CPM. Has family support at discharge.   Anticipated DC Date:  07/10/2012   Anticipated DC Plan:  HOME W HOME HEALTH SERVICES      DC Planning Services  CM consult      [facility]  HOME HEALTH   Choice offered to / List presented to:  C-1 Patient        HH arranged  HH-1 RN  HH-2 PT      [facility] agency  [facility]   Status of service:  Completed, signed off Medicare Important Message given?   (If response is "NO", the following Medicare IM given date fields will be blank) Date Medicare IM given:   Date Additional Medicare IM given:    Discharge Disposition:  HOME W HOME HEALTH SERVICES  Per UR Regulation:    If discussed at Long Length of Stay Meetings, dates discussed:    Comments:

## 2012-07-09 NOTE — Progress Notes (Signed)
Subjective: 1 Day Post-Op Procedure(s) (LRB): TOTAL KNEE ARTHROPLASTY with revision components  (Right)  Activity level:  wbat Diet tolerance:  ok Voiding:  ok Patient reports pain as 3 on 0-10 scale.    Objective: Vital signs in last 24 hours: Temp:  [97.7 F (36.5 C)-98.6 F (37 C)] 97.9 F (36.6 C) (03/14 0442) Pulse Rate:  [75-89] 88 (03/14 0442) Resp:  [12-22] 18 (03/14 0442) BP: (105-140)/(63-98) 110/63 mmHg (03/14 0442) SpO2:  [95 %-100 %] 99 % (03/14 0442)  Labs:  Recent Labs  07/09/12 0605  HGB 10.9*    Recent Labs  07/09/12 0605  WBC 8.7  RBC 3.68*  HCT 32.1*  PLT 218    Recent Labs  07/09/12 0605  NA 138  K 3.9  CL 101  CO2 27  BUN 12  CREATININE 0.64  GLUCOSE 108*  CALCIUM 9.2    Recent Labs  07/09/12 0605  INR 1.06    Physical Exam:  Neurologically intact ABD soft Neurovascular intact Sensation intact distally Intact pulses distally Dorsiflexion/Plantar flexion intact Incision: dressing C/D/I No cellulitis present Compartment soft  Assessment/Plan:  1 Day Post-Op Procedure(s) (LRB): TOTAL KNEE ARTHROPLASTY with revision components  (Right) Advance diet Up with therapy D/C IV fluids Plan for discharge tomorrowwith home pt Coumadin x 2 weeks  WBAT  RX's in chart    Moosa Bueche R 07/09/2012, 7:59 AM

## 2012-07-09 NOTE — Progress Notes (Signed)
Physical Therapy Treatment Patient Details Name: Renee Flores MRN: [id number] DOB: 09-Jul-1965 Today's Date: 07/09/2012 Time: 4098-1191 PT Time Calculation (min): 38 min  PT Assessment / Plan / Recommendation Comments on Treatment Session  Patient progressing very well this afternoon. Able to ambulate and complete stair training without any difficulty. ABle to ambulate without KI without instability or buckling. Anticipate DC in AM    Follow Up Recommendations  Home health PT;Supervision - Intermittent     Does the patient have the potential to tolerate intense rehabilitation     Barriers to Discharge        Equipment Recommendations  None recommended by PT    Recommendations for Other Services    Frequency 7X/week   Plan Discharge plan remains appropriate;Frequency remains appropriate    Precautions / Restrictions Precautions Precautions: Knee Required Braces or Orthoses: Knee Immobilizer - Right Restrictions Weight Bearing Restrictions: No RLE Weight Bearing: Weight bearing as tolerated   Pertinent Vitals/Pain no apparent distress     Mobility  Bed Mobility Supine to Sit: 6: Modified independent (Device/Increase time) Sitting - Scoot to Edge of Bed: 6: Modified independent (Device/Increase time) Transfers Sit to Stand: 6: Modified independent (Device/Increase time);With upper extremity assist;From bed;From chair/3-in-1 Stand to Sit: With upper extremity assist;To bed;To chair/3-in-1 Ambulation/Gait Ambulation/Gait Assistance: 5: Supervision Ambulation Distance (Feet): 200 Feet Assistive device: Rolling walker Ambulation/Gait Assistance Details: Patient ambulated back without KI and there was no evidence of buckling or instability Gait Pattern: Step-to pattern;Decreased step length - left;Decreased stance time - right Stairs: Yes Stairs Assistance: 4: Min guard Stair Management Technique: Step to pattern;Forwards;Two rails Number of Stairs: 2 (practiced twice)     Exercises Total Joint Exercises Quad Sets: AROM;Both;10 reps Short Arc QuadBarbaraann Boys;Right;15 reps Heel Slides: AAROM;Right;15 reps Straight Leg Raises: AAROM;Right;15 reps   PT Diagnosis:    PT Problem List:   PT Treatment Interventions:     PT Goals Acute Rehab PT Goals PT Goal: Supine/Side to Sit - Progress: Met PT Goal: Sit to Supine/Side - Progress: Met PT Goal: Sit to Stand - Progress: Met PT Goal: Stand to Sit - Progress: Met PT Goal: Ambulate - Progress: Progressing toward goal PT Goal: Up/Down Stairs - Progress: Progressing toward goal PT Goal: Perform Home Exercise Program - Progress: Progressing toward goal  Visit Information  Last PT Received On: 07/09/12 Assistance Needed: +1    Subjective Data      Cognition  Cognition Overall Cognitive Status: Appears within functional limits for tasks assessed/performed Arousal/Alertness: Awake/alert Orientation Level: Appears intact for tasks assessed Behavior During Session: [facility] for tasks performed    Balance  Balance Balance Assessed: Yes Dynamic Standing Balance Dynamic Standing - Balance Support: Right upper extremity supported;Left upper extremity supported Dynamic Standing - Level of Assistance: 5: Stand by assistance  End of Session PT - End of Session Equipment Utilized During Treatment: Gait belt Activity Tolerance: Patient tolerated treatment well Nurse Communication: Mobility status CPM Right Knee CPM Right Knee: Off   GP     Fredrich Birks 07/09/2012, 1:49 PM 07/09/2012 Fredrich Birks PTA [phone] pager [phone] office

## 2012-07-09 NOTE — Evaluation (Signed)
Physical Therapy Evaluation Patient Details Name: Renee Flores MRN: [id number] DOB: June 30, 1965 Today's Date: 07/09/2012 Time: 4098-1191 PT Time Calculation (min): 26 min  PT Assessment / Plan / Recommendation Clinical Impression  pt rpesents with R TKA.  pt moving great and anticipate great progress.  pt c/o some nausea while performing ROM, but resolving with rest.  Will continue to follow.      PT Assessment  Patient needs continued PT services    Follow Up Recommendations  Home health PT;Supervision - Intermittent    Does the patient have the potential to tolerate intense rehabilitation      Barriers to Discharge None      Equipment Recommendations  None recommended by PT    Recommendations for Other Services     Frequency 7X/week    Precautions / Restrictions Precautions Precautions: Fall;Knee Precaution Booklet Issued: Yes (comment) Required Braces or Orthoses: Knee Immobilizer - Right Knee Immobilizer - Right:  (KI on pt, but only order is to discontinue KI.  ) Restrictions Weight Bearing Restrictions: Yes RLE Weight Bearing: Weight bearing as tolerated   Pertinent Vitals/Pain Indicates more tightness than pain at this time.        Mobility  Bed Mobility Bed Mobility: Supine to Sit;Sitting - Scoot to Edge of Bed Supine to Sit: 5: Supervision Sitting - Scoot to Edge of Bed: 5: Supervision Details for Bed Mobility Assistance: cues for crossing pt's ankles to support R LE.   Transfers Transfers: Sit to Stand;Stand to Sit Sit to Stand: 4: Min guard;With upper extremity assist;From bed Stand to Sit: 4: Min guard;With upper extremity assist;To chair/3-in-1 Details for Transfer Assistance: Cues for UE use, controlling descent to chair.   Ambulation/Gait Ambulation/Gait Assistance: 4: Min guard Ambulation Distance (Feet): 120 Feet Assistive device: Rolling walker Ambulation/Gait Assistance Details: cues for upright posture, positioning in RW.   Gait Pattern:  Step-to pattern;Decreased step length - left;Decreased stance time - right Stairs: No Wheelchair Mobility Wheelchair Mobility: No    Exercises Total Joint Exercises Ankle Circles/Pumps: AROM;Both;10 reps Quad Sets: AROM;Both;10 reps Long Arc Quad: AAROM;Right;5 reps Knee Flexion: AAROM;Right;5 reps   PT Diagnosis: Abnormality of gait  PT Problem List: Decreased strength;Decreased range of motion;Decreased activity tolerance;Decreased balance;Decreased mobility;Decreased knowledge of use of DME;Pain PT Treatment Interventions: DME instruction;Gait training;Stair training;Functional mobility training;Therapeutic activities;Therapeutic exercise;Balance training;Patient/family education   PT Goals Acute Rehab PT Goals PT Goal Formulation: With patient Time For Goal Achievement: 07/16/12 Potential to Achieve Goals: Good Pt will go Supine/Side to Sit: with modified independence PT Goal: Supine/Side to Sit - Progress: Goal set today Pt will go Sit to Supine/Side: with modified independence PT Goal: Sit to Supine/Side - Progress: Goal set today Pt will go Sit to Stand: with modified independence PT Goal: Sit to Stand - Progress: Goal set today Pt will go Stand to Sit: with modified independence PT Goal: Stand to Sit - Progress: Goal set today Pt will Ambulate: >150 feet;with modified independence;with rolling walker PT Goal: Ambulate - Progress: Goal set today Pt will Go Up / Down Stairs: 1-2 stairs;with supervision;with rail(s) PT Goal: Up/Down Stairs - Progress: Goal set today Pt will Perform Home Exercise Program: with supervision, verbal cues required/provided PT Goal: Perform Home Exercise Program - Progress: Goal set today  Visit Information  Last PT Received On: 07/09/12 Assistance Needed: +1    Subjective Data  Subjective: I had my other one done 2 years ago.   Patient Stated Goal: Walk without a gimp.  Prior Functioning  Home Living Lives With:  Spouse;Family Available Help at Discharge: Family;Available 24 hours/day Type of Home: House Home Access: Stairs to enter [employer] of Steps: 2 Entrance Stairs-Rails: Right;Left;Can reach both Home Layout: One level Bathroom Shower/Tub: Tub/shower unit;Walk-in shower Bathroom Toilet: Standard Home Adaptive Equipment: Bedside commode/3-in-1;Shower chair with back;Straight cane;Walker - rolling Prior Function Level of Independence: Independent Able to Take Stairs?: Yes Driving: Yes Vocation: Full time employment Communication Communication: No difficulties    Cognition  Cognition Overall Cognitive Status: Appears within functional limits for tasks assessed/performed Arousal/Alertness: Awake/alert Orientation Level: Appears intact for tasks assessed Behavior During Session: [facility] for tasks performed    Extremity/Trunk Assessment Right Lower Extremity Assessment RLE ROM/Strength/Tone: Deficits RLE ROM/Strength/Tone Deficits: AAROM ~10- 75 RLE Sensation: WFL - Light Touch Left Lower Extremity Assessment LLE ROM/Strength/Tone: WFL for tasks assessed LLE Sensation: WFL - Light Touch Trunk Assessment Trunk Assessment: Normal   Balance Balance Balance Assessed: No  End of Session PT - End of Session Equipment Utilized During Treatment: Gait belt Activity Tolerance: Patient tolerated treatment well Patient left: in chair;with call bell/phone within reach Nurse Communication: Mobility status CPM Right Knee CPM Right Knee: Off  GP     Sunny Schlein, [place] [phone] 07/09/2012, 9:39 AM

## 2012-07-09 NOTE — Progress Notes (Signed)
UR COMPLETED  

## 2012-07-09 NOTE — Progress Notes (Signed)
ANTICOAGULATION CONSULT NOTE - Follow Up Consult  Pharmacy Consult for Coumadin  Indication: VTE prophylaxis  Allergies  Allergen Reactions  . Doxycycline Nausea Only  . Erythromycin Base Hives  . Morphine Other (See Comments)    Hallucinating   . Vancomycin Hcl Other (See Comments)    Ringing in ears    Patient Measurements:    Vital Signs: Temp: 97.9 F (36.6 C) (03/14 0442) BP: 121/78 mmHg (03/14 1038) Pulse Rate: 88 (03/14 0442)  Labs:  Recent Labs  07/09/12 0605  HGB 10.9*  HCT 32.1*  PLT 218  LABPROT 13.7  INR 1.06  CREATININE 0.64    The CrCl is unknown because both a height and weight (above a minimum accepted value) are required for this calculation.   Assessment: 47 year old s/p TKA on Coumadin for VTE prophylaxis. INR 1.06. Coumadin point score is 6. CBC shows post-op anemia. No bleeding noted.  Goal of Therapy:  INR 2-3 Monitor platelets by anticoagulation protocol: Yes   Plan:  Continue coumadin 7.5mg  daily  F/u AM INR  Thank you,  Brett Fairy, PharmD, BCPS 07/09/2012 11:08 AM

## 2012-07-09 NOTE — Progress Notes (Signed)
PT is recommending home with HH and not SNF. CSW will make CM aware. Clinical Social Worker will sign off for now as social work intervention is no longer needed. Please consult us again if new need arises.   Geraldyn Shain, MSW [phone] 

## 2012-07-09 NOTE — Evaluation (Signed)
Occupational Therapy Evaluation Patient Details Name: Renee Flores MRN: [id number] DOB: [birth year] Today's Date: 07/09/2012 Time: 4098-1191 OT Time Calculation (min): 31 min  OT Assessment / Plan / Recommendation Clinical Impression  Pleasant 47 yr old female admitted for RTKA.  Pt overall supervision level for simulated selfcare and functional transfers.  Pt will have 24 hour supervision from her family at discharge.  No further OT needs.    OT Assessment  Patient does not need any further OT services    Follow Up Recommendations  No OT follow up       Equipment Recommendations  None recommended by OT          Precautions / Restrictions Precautions Precautions: Knee Required Braces or Orthoses: Knee Immobilizer - Right Restrictions Weight Bearing Restrictions: No RLE Weight Bearing: Weight bearing as tolerated   Pertinent Vitals/Pain Pain 4/10 in the right knee, repositioned    ADL  Eating/Feeding: Simulated;Independent Where Assessed - Eating/Feeding: Chair Grooming: Performed;Supervision/safety Where Assessed - Grooming: Supported standing Upper Body Bathing: Simulated;Set up Where Assessed - Upper Body Bathing: Unsupported sitting Lower Body Bathing: Simulated;Supervision/safety Where Assessed - Lower Body Bathing: Supported sit to stand Upper Body Dressing: Simulated;Set up Where Assessed - Upper Body Dressing: Unsupported sitting Lower Body Dressing: Simulated;Set up Where Assessed - Lower Body Dressing: Unsupported sit to stand Toilet Transfer: Performed;Supervision/safety Toilet Transfer Method: Other (comment) (ambulate with RW) Toilet Transfer Equipment: Raised toilet seat with arms (or 3-in-1 over toilet) Toileting - Clothing Manipulation and Hygiene: Performed Where Assessed - Engineer, mining and Hygiene: Sit to stand from 3-in-1 or toilet Tub/Shower Transfer: Print production planner: Walk in  shower Equipment Used: Rolling walker;Knee Immobilizer Transfers/Ambulation Related to ADLs: Pt is able to ambulate with supervision with RW. ADL Comments: Pt overall supervision level for most selfcare tasks.  No further OT or DME needs.  She will have 24 hour supervision from family and has all DME.      Visit Information  Last OT Received On: 07/09/12 Assistance Needed: +1    Subjective Data  Subjective: I remember you from my last knee. Patient Stated Goal: Did not state   Prior Functioning     Home Living Lives With: Spouse;Family Available Help at Discharge: Family;Available 24 hours/day Type of Home: House Home Access: Stairs to enter [employer] of Steps: 2 Entrance Stairs-Rails: Right;Left;Can reach both Home Layout: One level Bathroom Shower/Tub: Tub/shower unit;Walk-in shower Bathroom Toilet: Standard Home Adaptive Equipment: Bedside commode/3-in-1;Shower chair with back;Straight cane;Walker - rolling Prior Function Level of Independence: Independent Able to Take Stairs?: Yes Driving: Yes Vocation: Full time employment Communication Communication: No difficulties         Vision/Perception Vision - History Baseline Vision: No visual deficits Patient Visual Report: No change from baseline Vision - Assessment Vision Assessment: Vision not tested Perception Perception: Within Functional Limits Praxis Praxis: Intact   Cognition  Cognition Overall Cognitive Status: Appears within functional limits for tasks assessed/performed Arousal/Alertness: Awake/alert Orientation Level: Appears intact for tasks assessed Behavior During Session: [facility] for tasks performed    Extremity/Trunk Assessment Right Upper Extremity Assessment RUE ROM/Strength/Tone: Within functional levels RUE Sensation: WFL - Light Touch RUE Coordination: WFL - gross/fine motor Left Upper Extremity Assessment LUE ROM/Strength/Tone: Within functional levels LUE Sensation: WFL  - Light Touch LUE Coordination: WFL - gross/fine motor Trunk Assessment Trunk Assessment: Normal     Mobility Transfers Transfers: Sit to Stand Sit to Stand: 5: Supervision;With upper extremity assist;With armrests;From chair/3-in-1 Stand to Sit: 5: Supervision;Without  upper extremity assist;With armrests;To chair/3-in-1        Balance Balance Balance Assessed: Yes Dynamic Standing Balance Dynamic Standing - Balance Support: Right upper extremity supported;Left upper extremity supported Dynamic Standing - Level of Assistance: 5: Stand by assistance   End of Session OT - End of Session Equipment Utilized During Treatment: Right knee immobilizer Activity Tolerance: Patient tolerated treatment well Patient left: in chair;with call bell/phone within reach Nurse Communication: Mobility status CPM Right Knee CPM Right Knee: Off     Jackolyn Geron OTR/L Pager number [phone] 07/09/2012, 1:27 PM

## 2012-07-10 LAB — PROTIME-INR
INR: 1.35 (ref 0.00–1.49)
Prothrombin Time: 16.4 seconds — ABNORMAL HIGH (ref 11.6–15.2)

## 2012-07-10 LAB — CBC
HCT: 30.1 % — ABNORMAL LOW (ref 36.0–46.0)
Hemoglobin: 10.1 g/dL — ABNORMAL LOW (ref 12.0–15.0)
MCH: 30 pg (ref 26.0–34.0)
MCHC: 33.6 g/dL (ref 30.0–36.0)
MCV: 89.3 fL (ref 78.0–100.0)
Platelets: 213 10*3/uL (ref 150–400)
RBC: 3.37 MIL/uL — ABNORMAL LOW (ref 3.87–5.11)
RDW: 12.5 % (ref 11.5–15.5)
WBC: 9.4 10*3/uL (ref 4.0–10.5)

## 2012-07-10 NOTE — Progress Notes (Signed)
Physical Therapy Treatment Patient Details Name: Renee Flores MRN: [id number] DOB: Oct 18, 1965 Today's Date: 07/10/2012 Time: 6962-9528 PT Time Calculation (min): 31 min  PT Assessment / Plan / Recommendation Comments on Treatment Session  Pt feels ready for DC.  No further questions for PT.  Will have assist at home.  Advised pt to increase activitty gradually at home    Follow Up Recommendations  Home health PT;Supervision - Intermittent     Does the patient have the potential to tolerate intense rehabilitation     Barriers to Discharge        Equipment Recommendations  None recommended by PT    Recommendations for Other Services    Frequency 7X/week   Plan Discharge plan remains appropriate;Other (comment) (Anticipate DC home today)    Precautions / Restrictions Precautions Precautions: Knee Required Braces or Orthoses:  (Order to DC KI) Restrictions Weight Bearing Restrictions: Yes RLE Weight Bearing: Weight bearing as tolerated   Pertinent Vitals/Pain 6/10 r knee    Mobility  Bed Mobility Bed Mobility: Supine to Sit;Sit to Supine Supine to Sit: 6: Modified independent (Device/Increase time) Sit to Supine: 6: Modified independent (Device/Increase time) Details for Bed Mobility Assistance:  (pt used LLE to assist RLE with bed mobility) Transfers Transfers: Sit to Stand;Stand to Sit Sit to Stand: 6: Modified independent (Device/Increase time);With upper extremity assist;From bed;From chair/3-in-1 Stand to Sit: With upper extremity assist;To bed;To chair/3-in-1 Details for Transfer Assistance:  (pt demonstrated safe and proper technique) Ambulation/Gait Ambulation/Gait Assistance: 5: Supervision Ambulation Distance (Feet): 100 Feet Assistive device: Rolling walker Ambulation/Gait Assistance Details:  (cues to not get too close to front of walker) Gait Pattern: Step-to pattern;Decreased step length - left;Decreased stance time - right    Exercises Total Joint  Exercises Ankle Circles/Pumps: AROM;Both;5 reps Quad Sets: AROM;Right;5 reps Hip ABduction/ADduction: 10 reps;AAROM;Supine Straight Leg Raises: AAROM;Right;10 reps;Supine Long Arc Quad: AAROM;10 reps;Seated Knee Flexion: AAROM;Right Goniometric ROM: 68 degrees flexion   PT Diagnosis:    PT Problem List:   PT Treatment Interventions:     PT Goals Acute Rehab PT Goals Pt will go Supine/Side to Sit: with modified independence PT Goal: Supine/Side to Sit - Progress: Met Pt will go Sit to Supine/Side: with modified independence PT Goal: Sit to Supine/Side - Progress: Met Pt will go Sit to Stand: with modified independence PT Goal: Sit to Stand - Progress: Met Pt will go Stand to Sit: with modified independence PT Goal: Stand to Sit - Progress: Met Pt will Ambulate: >150 feet;with modified independence;with rolling walker PT Goal: Ambulate - Progress: Progressing toward goal Pt will Go Up / Down Stairs: 1-2 stairs;with supervision;with rail(s) PT Goal: Up/Down Stairs - Progress: Other (comment) (NT today.  Pt did not feel need to practice) Pt will Perform Home Exercise Program: with supervision, verbal cues required/provided PT Goal: Perform Home Exercise Program - Progress: Progressing toward goal  Visit Information  Last PT Received On: 07/10/12 Assistance Needed: +1    Subjective Data      Cognition  Cognition Overall Cognitive Status: Appears within functional limits for tasks assessed/performed Arousal/Alertness: Awake/alert Orientation Level: Appears intact for tasks assessed Behavior During Session: [name] for tasks performed    Balance     End of Session PT - End of Session Activity Tolerance: Patient tolerated treatment well Patient left: in bed;with call bell/phone within reach CPM Right Knee CPM Right Knee: Off   GP     Renee Flores 07/10/2012, 9:28 AM Renee Flores, PT  [phone]  07/10/2012   

## 2012-07-10 NOTE — Discharge Summary (Signed)
Patient ID: Renee Flores MRN: [id number] DOB/AGE: 08/22/1965 47 y.o.  Admit date: 07/08/2012 Discharge date: 07/10/2012  Admission Diagnoses:  Principal Problem:   Right knee DJD   Discharge Diagnoses:  Same  Past Medical History  Diagnosis Date  . Hypertension   . High cholesterol   . Osteopenia 03/2011    tscore -1.8 FRAX 2.9%/0.4%  . Vitamin D deficiency 2013    19  . MS (multiple sclerosis)     questionable  . Factor 5 Leiden mutation, heterozygous   . Heart palpitations   . Heart valve problem     mild- moderate mitral valve leakage   . Complication of anesthesia     anxiety attack after waking up from anesthesia  . Heart murmur   . Arthritis   . GERD (gastroesophageal reflux disease)     Surgeries: Procedure(s): TOTAL KNEE ARTHROPLASTY with revision components  on 07/08/2012   Consultants:    Discharged Condition: Improved  Hospital Course: Renee Flores is an 47 y.o. female who was admitted 07/08/2012 for operative treatment ofRight knee DJD. Patient has severe unremitting pain that affects sleep, daily activities, and work/hobbies. After pre-op clearance the patient was taken to the operating room on 07/08/2012 and underwent  Procedure(s): TOTAL KNEE ARTHROPLASTY with revision components .    Patient was given perioperative antibiotics: Anti-infectives   Start     Dose/Rate Route Frequency Ordered Stop   07/08/12 1700  ceFAZolin (ANCEF) IVPB 2 g/50 mL premix     2 g 100 mL/hr over 30 Minutes Intravenous 4 times per day 07/08/12 1520 07/08/12 2349   07/08/12 0600  ceFAZolin (ANCEF) IVPB 2 g/50 mL premix     2 g 100 mL/hr over 30 Minutes Intravenous On call to O.R. 07/07/12 1424 07/08/12 1100       Patient was given sequential compression devices, early ambulation, and chemoprophylaxis to prevent DVT. Coumadin for 2 weeks.  Patient benefited maximally from hospital stay and there were no complications.    Recent vital signs: Patient Vitals for the past 24  hrs:  BP Temp Temp src Pulse Resp SpO2  07/10/12 0611 114/66 mmHg 98.5 F (36.9 C) Oral 85 16 93 %  07/10/12 0000 - - - - 16 96 %  07/09/12 2014 109/66 mmHg 98 F (36.7 C) Oral 80 18 94 %  07/09/12 2000 - - - - 18 96 %  07/09/12 1526 129/76 mmHg 97.1 F (36.2 C) - 78 18 99 %  07/09/12 1200 - - - - 16 -  07/09/12 1038 121/78 mmHg - - - - -  07/09/12 0800 - - - - 16 -     Recent laboratory studies:  Recent Labs  07/09/12 0605 07/10/12 0635  WBC 8.7 9.4  HGB 10.9* 10.1*  HCT 32.1* 30.1*  PLT 218 213  NA 138  --   K 3.9  --   CL 101  --   CO2 27  --   BUN 12  --   CREATININE 0.64  --   GLUCOSE 108*  --   INR 1.06 1.35  CALCIUM 9.2  --      Discharge Medications:     Medication List    STOP taking these medications       ibuprofen 800 MG tablet  Commonly known as:  ADVIL,MOTRIN      TAKE these medications       ALAWAY 0.025 % ophthalmic solution  Generic drug:  ketotifen  Place 2 drops  into both eyes daily.     CALCIUM 600/VITAMIN D PO  Take 1 tablet by mouth 2 (two) times daily.     cloNIDine 0.1 MG tablet  Commonly known as:  CATAPRES  Take 0.1 mg by mouth 2 (two) times daily.     esomeprazole 40 MG capsule  Commonly known as:  NEXIUM  Take 40 mg by mouth daily before breakfast.     estradiol 1 MG tablet  Commonly known as:  ESTRACE  Take 1.5 tablets (1.5 mg total) by mouth daily.     fexofenadine 180 MG tablet  Commonly known as:  ALLEGRA  Take 180 mg by mouth daily as needed (allergies).     fluticasone 50 MCG/ACT nasal spray  Commonly known as:  FLONASE  Place 2 sprays into the nose daily.     gabapentin 600 MG tablet  Commonly known as:  NEURONTIN  Take 600 mg by mouth at bedtime.     HYDROcodone-acetaminophen 5-325 MG per tablet  Commonly known as:  NORCO/VICODIN  Take 1 tablet by mouth every 4 (four) hours as needed for pain.     losartan 50 MG tablet  Commonly known as:  COZAAR  Take 50 mg by mouth 2 (two) times daily.      methocarbamol 500 MG tablet  Commonly known as:  ROBAXIN  Take 1 tablet (500 mg total) by mouth every 6 (six) hours as needed.     Potassium Gluconate 550 MG Tabs  Take 1 tablet by mouth 2 (two) times daily.     simvastatin 40 MG tablet  Commonly known as:  ZOCOR  Take 40 mg by mouth at bedtime.     warfarin 5 MG tablet  Commonly known as:  COUMADIN  Take 1 tablet (5 mg total) by mouth daily.        Diagnostic Studies: Dg Chest 2 View  07/01/2012  *RADIOLOGY REPORT*  Clinical Data: Preop.  CHEST - 2 VIEW  Comparison: 01/29/2010.  Findings: Trachea is midline.  Heart size normal.  Lungs are clear. No pleural fluid.  IMPRESSION: No acute findings   Original Report Authenticated By: Leanna Battles, M.D.     Disposition: 06-Home-Health Care Svc      Discharge Orders   Future Orders Complete By Expires     Call MD / Call 911  As directed     Comments:      If you experience chest pain or shortness of breath, CALL 911 and be transported to the hospital emergency room.  If you develope a fever above 101 F, pus (white drainage) or increased drainage or redness at the wound, or calf pain, call your surgeon's office.    Constipation Prevention  As directed     Comments:      Drink plenty of fluids.  Prune juice may be helpful.  You may use a stool softener, such as Colace (over the counter) 100 mg twice a day.  Use MiraLax (over the counter) for constipation as needed.    Diet - low sodium heart healthy  As directed     Increase activity slowly as tolerated  As directed        Follow-up Information   Follow up with DALLDORF,PETER G, MD. Call in 2 weeks.   Contact information:   [address] [place] [address] [phone]        Signed: Prince Rome 07/10/2012, 7:43 AM

## 2012-07-10 NOTE — Progress Notes (Signed)
2 Days Post-Op R Total Knee Arthroplasty with revision components. Pt doing well, px is well controlled, eager to return home, has been making appropriate gains with PT/OT.   BP 114/66  Pulse 85  Temp(Src) 98.5 F (36.9 C) (Oral)  Resp 16  SpO2 93%  HGB 10.1  Dressing C/D/I, Neurovascular intact, sensation intact distally, 2+ pulses distally. Dorsiflexion/Plantar flexion intact  No cellulitis present. Compartments soft   2 Days Post-Op R Total Knee Arthroplasty with revision components, doing excellent   -Up with therapy today and d/c home after to home health/PT  -Coumadin x 2 weeks per pharmacy  -WBAT  -RX's in chart, f/u appt made  -Home therapy/health already established  -D/C home today

## 2012-07-12 ENCOUNTER — Encounter (HOSPITAL_COMMUNITY): Payer: Self-pay | Admitting: Orthopaedic Surgery

## 2012-10-25 ENCOUNTER — Other Ambulatory Visit: Payer: Self-pay

## 2012-10-25 MED ORDER — ESTRADIOL 1 MG PO TABS: 1.5000 mg | ORAL_TABLET | Freq: Every day | ORAL | Status: DC

## 2013-02-11 ENCOUNTER — Other Ambulatory Visit: Payer: Self-pay

## 2013-02-11 MED ORDER — ESTRADIOL 1 MG PO TABS: 1.5000 mg | ORAL_TABLET | Freq: Every day | ORAL | Status: DC

## 2013-02-26 DIAGNOSIS — M8580 Other specified disorders of bone density and structure, unspecified site: Secondary | ICD-10-CM

## 2013-02-26 HISTORY — DX: Other specified disorders of bone density and structure, unspecified site: M85.80

## 2013-03-01 ENCOUNTER — Other Ambulatory Visit: Payer: Self-pay | Admitting: Neurology

## 2013-03-01 MED ORDER — GABAPENTIN 600 MG PO TABS: 600.0000 mg | ORAL_TABLET | Freq: Every day | ORAL | Status: DC

## 2013-03-03 ENCOUNTER — Other Ambulatory Visit: Payer: Self-pay

## 2013-03-03 ENCOUNTER — Encounter: Payer: Self-pay | Admitting: Gynecology

## 2013-04-07 ENCOUNTER — Ambulatory Visit (INDEPENDENT_AMBULATORY_CARE_PROVIDER_SITE_OTHER): Payer: BC Managed Care – PPO | Admitting: Gynecology

## 2013-04-07 ENCOUNTER — Encounter: Payer: Self-pay | Admitting: Gynecology

## 2013-04-07 ENCOUNTER — Other Ambulatory Visit (HOSPITAL_COMMUNITY)
Admission: RE | Admit: 2013-04-07 | Discharge: 2013-04-07 | Disposition: A | Discharge status: Home / Self Care | Payer: BC Managed Care – PPO | Source: Ambulatory Visit | Attending: Gynecology | Admitting: Gynecology

## 2013-04-07 VITALS — BP 150/98 | Ht 62.0 in | Wt 220.0 lb

## 2013-04-07 DIAGNOSIS — Z01419 Encounter for gynecological examination (general) (routine) without abnormal findings: Secondary | ICD-10-CM

## 2013-04-07 DIAGNOSIS — I10 Essential (primary) hypertension: Secondary | ICD-10-CM

## 2013-04-07 DIAGNOSIS — Z79890 Hormone replacement therapy (postmenopausal): Secondary | ICD-10-CM

## 2013-04-07 MED ORDER — ESTRADIOL 1 MG PO TABS: 1.5000 mg | ORAL_TABLET | Freq: Every day | ORAL | Status: DC

## 2013-04-07 NOTE — Addendum Note (Signed)
Addended by: Dayna Barker on: 04/07/2013 04:10 PM   Modules accepted: Orders

## 2013-04-07 NOTE — Patient Instructions (Signed)
Followup in 1 year, sooner as needed. Followup for your mammogram as it is due now.

## 2013-04-07 NOTE — Progress Notes (Signed)
Renee Flores 1965-09-13 [id number]        47 y.o.  G2P2002 for annual exam.  Several issues noted below.  Past medical history,surgical history, problem list, medications, allergies, family history and social history were all reviewed and documented in the EPIC chart.  ROS:  Performed and pertinent positives and negatives are included in the history, assessment and plan .  Exam: Kim assistant Filed Vitals:   04/07/13 1523  BP: 150/98  Height: 5\' 2"  (1.575 m)  Weight: 220 lb (99.791 kg)   General appearance  Normal Skin grossly normal Head/Neck normal with no cervical or supraclavicular adenopathy thyroid normal Lungs  clear Cardiac RR, without RMG Abdominal  soft, nontender, without masses, organomegaly or hernia Breasts  examined lying and sitting without masses, retractions, discharge or axillary adenopathy. Pelvic  Ext/BUS/vagina  Normal pap of cuff done  Adnexa  Without masses or tenderness    Anus and perineum  Normal   Rectovaginal  Normal sphincter tone without palpated masses or tenderness.    Assessment/Plan:  47 y.o. G66P2002 female for annual exam.   1. Status post TAH RSO/laparoscopic LSO on ERT of 1.5 mg estradiol daily. Has tried to wean with unacceptable hot flashes and night sweats. Has tried estrogen patch without success.  I again reviewed the whole issue of HRT with her to include the WHI study with increased risk of stroke, heart attack, DVT and breast cancer. The ACOG and NAMS statements for lowest dose for the shortest period of time reviewed. Transdermal versus oral first-pass effect benefit discussed.  She does have a history of factor V Leiden heterologous mutation. Increased risk of thrombosis associated with estrogen and this reviewed. Also being followed for hypertension and possible increased risks associated with this also. Benefits from early ERT from a cardiovascular risk and bone health also discussed. Risk versus benefit issues discussed. After lengthy  discussion we both agree on continuing her estrogen for now and I refilled her estradiol 1.5 mg daily. 2. Osteopenia. DEXA 02/2013 with T score -1.3. FRAX was not calculated due to her being on estrogen by the facility. Overall stable from prior studies. Recent vitamin D at her primary physician's office 17. She is on vitamin D 50,000 units weekly. She'll followup with her primary physician in reference to this. 3. Mammography due now she knows to schedule this. SBE monthly reviewed. 4. Pap smear 2011. Pap of cuff done today.  No history of significant abnormal Pap smears previously. Options to stop screening altogether she is status post hysterectomy versus less frequent screening intervals reviewed. Will readdress on annual basis. 5. Hypertension. Blood pressure 150/98 today. She is having her medication modified by her physician and will continue to followup with in reference to this. 6. Health maintenance. No blood work done as she recently had all done through her primary physician's office. Followup one year, sooner as needed. 7.    Note: This document was prepared with digital dictation and possible smart phrase technology. Any transcriptional errors that result from this process are unintentional.   Dara Lords MD, 4:02 PM 04/07/2013

## 2013-04-08 ENCOUNTER — Other Ambulatory Visit: Payer: Self-pay | Admitting: Gynecology

## 2013-04-08 DIAGNOSIS — R3129 Other microscopic hematuria: Secondary | ICD-10-CM

## 2013-04-08 LAB — URINALYSIS W MICROSCOPIC + REFLEX CULTURE
Bilirubin Urine: NEGATIVE
Casts: NONE SEEN
Crystals: NONE SEEN
Glucose, UA: NEGATIVE mg/dL
Ketones, ur: NEGATIVE mg/dL
Leukocytes, UA: NEGATIVE
Nitrite: NEGATIVE
Protein, ur: NEGATIVE mg/dL
Specific Gravity, Urine: 1.013 (ref 1.005–1.030)
Urobilinogen, UA: 0.2 mg/dL (ref 0.0–1.0)
pH: 7 (ref 5.0–8.0)

## 2013-04-09 LAB — URINE CULTURE: Colony Count: 2000

## 2013-04-11 ENCOUNTER — Other Ambulatory Visit: Payer: BC Managed Care – PPO

## 2013-04-11 DIAGNOSIS — R3129 Other microscopic hematuria: Secondary | ICD-10-CM

## 2013-04-12 LAB — URINALYSIS W MICROSCOPIC + REFLEX CULTURE
Bacteria, UA: NONE SEEN
Bilirubin Urine: NEGATIVE
Casts: NONE SEEN
Crystals: NONE SEEN
Glucose, UA: NEGATIVE mg/dL
Hgb urine dipstick: NEGATIVE
Ketones, ur: NEGATIVE mg/dL
Leukocytes, UA: NEGATIVE
Nitrite: NEGATIVE
Protein, ur: NEGATIVE mg/dL
Specific Gravity, Urine: 1.011 (ref 1.005–1.030)
Squamous Epithelial / LPF: NONE SEEN
Urobilinogen, UA: 0.2 mg/dL (ref 0.0–1.0)
pH: 6 (ref 5.0–8.0)

## 2013-05-09 ENCOUNTER — Other Ambulatory Visit: Payer: Self-pay | Admitting: Neurology

## 2013-06-01 ENCOUNTER — Encounter (HOSPITAL_COMMUNITY): Payer: Self-pay | Admitting: Emergency Medicine

## 2013-06-01 ENCOUNTER — Emergency Department (HOSPITAL_COMMUNITY)
Admission: EM | Admit: 2013-06-01 | Discharge: 2013-06-01 | Disposition: A | Discharge status: Home / Self Care | Payer: BC Managed Care – PPO | Attending: Emergency Medicine | Admitting: Emergency Medicine

## 2013-06-01 ENCOUNTER — Emergency Department (HOSPITAL_COMMUNITY): Payer: BC Managed Care – PPO

## 2013-06-01 DIAGNOSIS — E7800 Pure hypercholesterolemia, unspecified: Secondary | ICD-10-CM | POA: Insufficient documentation

## 2013-06-01 DIAGNOSIS — Z79899 Other long term (current) drug therapy: Secondary | ICD-10-CM | POA: Insufficient documentation

## 2013-06-01 DIAGNOSIS — M949 Disorder of cartilage, unspecified: Secondary | ICD-10-CM

## 2013-06-01 DIAGNOSIS — M129 Arthropathy, unspecified: Secondary | ICD-10-CM | POA: Insufficient documentation

## 2013-06-01 DIAGNOSIS — R0602 Shortness of breath: Secondary | ICD-10-CM | POA: Insufficient documentation

## 2013-06-01 DIAGNOSIS — M899 Disorder of bone, unspecified: Secondary | ICD-10-CM | POA: Insufficient documentation

## 2013-06-01 DIAGNOSIS — R059 Cough, unspecified: Secondary | ICD-10-CM | POA: Insufficient documentation

## 2013-06-01 DIAGNOSIS — R011 Cardiac murmur, unspecified: Secondary | ICD-10-CM | POA: Insufficient documentation

## 2013-06-01 DIAGNOSIS — M7989 Other specified soft tissue disorders: Secondary | ICD-10-CM | POA: Insufficient documentation

## 2013-06-01 DIAGNOSIS — R05 Cough: Secondary | ICD-10-CM | POA: Insufficient documentation

## 2013-06-01 DIAGNOSIS — Z862 Personal history of diseases of the blood and blood-forming organs and certain disorders involving the immune mechanism: Secondary | ICD-10-CM | POA: Insufficient documentation

## 2013-06-01 DIAGNOSIS — IMO0002 Reserved for concepts with insufficient information to code with codable children: Secondary | ICD-10-CM | POA: Insufficient documentation

## 2013-06-01 DIAGNOSIS — E559 Vitamin D deficiency, unspecified: Secondary | ICD-10-CM | POA: Insufficient documentation

## 2013-06-01 DIAGNOSIS — R0600 Dyspnea, unspecified: Secondary | ICD-10-CM

## 2013-06-01 DIAGNOSIS — R079 Chest pain, unspecified: Secondary | ICD-10-CM | POA: Insufficient documentation

## 2013-06-01 DIAGNOSIS — I10 Essential (primary) hypertension: Secondary | ICD-10-CM | POA: Insufficient documentation

## 2013-06-01 DIAGNOSIS — Z8669 Personal history of other diseases of the nervous system and sense organs: Secondary | ICD-10-CM | POA: Insufficient documentation

## 2013-06-01 DIAGNOSIS — Z7982 Long term (current) use of aspirin: Secondary | ICD-10-CM | POA: Insufficient documentation

## 2013-06-01 DIAGNOSIS — K219 Gastro-esophageal reflux disease without esophagitis: Secondary | ICD-10-CM | POA: Insufficient documentation

## 2013-06-01 LAB — COMPREHENSIVE METABOLIC PANEL
ALT: 19 U/L (ref 0–35)
AST: 21 U/L (ref 0–37)
Albumin: 3.8 g/dL (ref 3.5–5.2)
Alkaline Phosphatase: 82 U/L (ref 39–117)
BUN: 10 mg/dL (ref 6–23)
CO2: 25 mEq/L (ref 19–32)
Calcium: 8.9 mg/dL (ref 8.4–10.5)
Chloride: 101 mEq/L (ref 96–112)
Creatinine, Ser: 0.68 mg/dL (ref 0.50–1.10)
GFR calc Af Amer: >90 mL/min (ref >90)
GFR calc non Af Amer: >90 mL/min (ref >90)
Glucose, Bld: 91 mg/dL (ref 70–99)
Potassium: 4.3 mEq/L (ref 3.7–5.3)
Sodium: 139 mEq/L (ref 137–147)
Total Bilirubin: 0.3 mg/dL (ref 0.3–1.2)
Total Protein: 7.6 g/dL (ref 6.0–8.3)

## 2013-06-01 LAB — CBC WITH DIFFERENTIAL/PLATELET
Basophils Absolute: 0 10*3/uL (ref 0.0–0.1)
Basophils Relative: 1 % (ref 0–1)
Eosinophils Absolute: 0.2 10*3/uL (ref 0.0–0.7)
Eosinophils Relative: 3 % (ref 0–5)
HCT: 36.8 % (ref 36.0–46.0)
Hemoglobin: 12.2 g/dL (ref 12.0–15.0)
Lymphocytes Relative: 33 % (ref 12–46)
Lymphs Abs: 2.4 10*3/uL (ref 0.7–4.0)
MCH: 29.5 pg (ref 26.0–34.0)
MCHC: 33.2 g/dL (ref 30.0–36.0)
MCV: 88.9 fL (ref 78.0–100.0)
Monocytes Absolute: 0.5 10*3/uL (ref 0.1–1.0)
Monocytes Relative: 6 % (ref 3–12)
Neutro Abs: 4.2 10*3/uL (ref 1.7–7.7)
Neutrophils Relative %: 57 % (ref 43–77)
Platelets: 253 10*3/uL (ref 150–400)
RBC: 4.14 MIL/uL (ref 3.87–5.11)
RDW: 12 % (ref 11.5–15.5)
WBC: 7.3 10*3/uL (ref 4.0–10.5)

## 2013-06-01 LAB — TROPONIN I: Troponin I: <0.3 ng/mL (ref <0.30)

## 2013-06-01 MED ORDER — IOHEXOL 350 MG/ML SOLN
100.0000 mL | Freq: Once | INTRAVENOUS | Status: AC | PRN
  Administered 2013-06-01: 100 mL via INTRAVENOUS

## 2013-06-01 MED ORDER — LORAZEPAM 2 MG/ML IJ SOLN
1.0000 mg | Freq: Once | INTRAMUSCULAR | Status: AC
  Administered 2013-06-01: 1 mg via INTRAVENOUS
  Filled 2013-06-01: qty 1

## 2013-06-01 MED ORDER — SODIUM CHLORIDE 0.9 % IV SOLN
INTRAVENOUS | Status: DC
Start: 2013-06-01 — End: 2013-06-01
  Administered 2013-06-01: 09:00:00 via INTRAVENOUS

## 2013-06-01 MED ORDER — ASPIRIN 81 MG PO CHEW
324.0000 mg | CHEWABLE_TABLET | Freq: Once | ORAL | Status: AC
  Administered 2013-06-01: 324 mg via ORAL
  Filled 2013-06-01: qty 4

## 2013-06-01 MED ORDER — ASPIRIN 81 MG PO CHEW: 81.0000 mg | CHEWABLE_TABLET | Freq: Every day | ORAL | Status: DC

## 2013-06-01 NOTE — ED Notes (Signed)
IV d/c'd from right [facility] -- catheter intact.

## 2013-06-01 NOTE — Discharge Instructions (Signed)
Aspirin and Your Heart Aspirin affects the way your blood clots and helps "thin" the blood. Aspirin has many uses in heart disease. It may be used as a primary prevention to help reduce the risk of heart related events. It also can be used as a secondary measure to prevent more heart attacks or to prevent additional damage from blood clots.  ASPIRIN MAY HELP IF YOU:  Have had a heart attack or chest pain.  Have undergone open heart surgery such as CABG (Coronary Artery Bypass Surgery).  Have had coronary angioplasty with or without stents.  Have experienced a stroke or TIA (transient ischemic attack).  Have peripheral vascular disease (PAD).  Have chronic heart rhythm problems such as atrial fibrillation.  Are at risk for heart disease. BEFORE STARTING ASPIRIN Before you start taking aspirin, your caregiver will need to review your medical history. Many things will need to be taken into consideration, such as:  Smoking status.  Blood pressure.  Diabetes.  Gender.  Weight.  Cholesterol level. ASPIRIN DOSES  Aspirin should only be taken on the advice of your caregiver. Talk to your caregiver about how much aspirin you should take. Aspirin comes in different doses such as:  81 mg.  162 mg.  325 mg.  The aspirin dose you take may be affected by many factors, some of which include:  Your current medications, especially if your are taking blood-thinners or anti-platelet medicine.  Liver function.  Heart disease risk.  Age.  Aspirin comes in two forms:  Non-enteric-coated. This type of aspirin does not have a coating and is absorbed faster. Non-enteric coated aspirin is recommended for patients experiencing chest pain symptoms. This type of aspirin also comes in a chewable form.  Enteric-coated. This means the aspirin has a special coating that releases the medicine very slowly. Enteric-coated aspirin causes less stomach upset. This type of aspirin should not be chewed  or crushed. ASPIRIN SIDE EFFECTS Daily use of aspirin can increase your risk of serious side effects. Some of these include:  Increased bleeding. This can range from a cut that does not stop bleeding to more serious problems such as stomach bleeding or bleeding into the brain (Intracerebral bleeding).  Increased bruising.  Stomach upset.  An allergic reaction such as red, itchy skin.  Increased risk of bleeding when combined with non-steroidal anti-inflammatory medicine (NSAIDS).  Alcohol should be drank in moderation when taking aspirin. Alcohol can increase the risk of stomach bleeding when taken with aspirin.  Aspirin should not be given to children less than 29 years of age due to the association of Reye syndrome. Reye syndrome is a serious illness that can affect the brain and liver. Studies have linked Reye syndrome with aspirin use in children.  People that have nasal polyps have an increased risk of developing an aspirin allergy. SEEK MEDICAL CARE IF:   You develop an allergic reaction such as:  Hives.  Itchy skin.  Swelling of the lips, tongue or face.  You develop stomach pain.  You have unusual bleeding or bruising.  You have ringing in your ears. SEEK IMMEDIATE MEDICAL CARE IF:   You have severe chest pain, especially if the pain is crushing or pressure-like and spreads to the arms, back, neck, or jaw. THIS IS AN EMERGENCY. Do not wait to see if the pain will go away. Get medical help at once. Call your local emergency services (911 in the [place] DO NOT drive yourself to the hospital.  You have stroke-like symptoms  such as:  Loss of vision.  Difficulty talking.  Numbness or weakness on one side of your body.  Numbness or weakness in your arm or leg.  Not thinking clearly or feeling confused.  Your bowel movements are bloody, dark red or black in color.  You vomit or cough up blood.  You have blood in your urine.  You have shortness of breath,  coughing or wheezing. MAKE SURE YOU:   Understand these instructions.  Will monitor your condition.  Seek immediate medical care if necessary. Document Released: 03/27/2008 Document Revised: 08/09/2012 Document Reviewed: 03/27/2008 [facility] Patient Information 2014 [place].  Return for chest pain not lasting 15 minutes or longer. Keep your appointment with cardiology. Followup with your doctor in the next few days for blood pressure recheck. Recommend start taking a baby aspirin a day prescription provided.

## 2013-06-01 NOTE — ED Notes (Signed)
Patient transported to CT 

## 2013-06-01 NOTE — ED Notes (Signed)
Dr. Zackowski at bedside  

## 2013-06-01 NOTE — ED Provider Notes (Signed)
CSN: [id number]     Arrival date & time 06/01/13  7035 History   First MD Initiated Contact with Patient 06/01/13 [phone]     Chief Complaint  Patient presents with  . Chest Pain   (Consider location/radiation/quality/duration/timing/severity/associated sxs/prior Treatment) Patient is a 48 y.o. female presenting with chest pain. The history is provided by the patient.  Chest Pain Associated symptoms: cough and shortness of breath   Associated symptoms: no abdominal pain, no back pain, no fever, no headache, no nausea and not vomiting    patient sent in from work for evaluation of chest pain. Patient has had chest pain for about 2 months it is intermittent brief not lasting 15 minutes or longer. Pain is a left chest breast area radiates to the left arm. Nonradiating other than to the arm. When is present its 8/10 currently it's her attend no pain. Patient describes pain as squeezing. Patient has known hypertension poor control of late but her doctors been working on getting it under control. Also complaint of bilateral leg swelling. Patient has an appointment with cardiology coming up on February 15. Symptoms also associated with some shortness of breath and cough. Denies fevers.  Past Medical History  Diagnosis Date  . Hypertension   . High cholesterol   . Osteopenia 02/2013    T score -1.3 FRAX not calculated due to patient on HRT  . Vitamin D deficiency 2014    17  . MS (multiple sclerosis)     questionable  . Factor 5 Leiden mutation, heterozygous   . Heart palpitations   . Heart valve problem     mild- moderate mitral valve leakage   . Complication of anesthesia     anxiety attack after waking up from anesthesia  . Heart murmur   . Arthritis   . GERD (gastroesophageal reflux disease)    Past Surgical History  Procedure Laterality Date  . Pelvic laparoscopy      LSO  . Chloecystectomy    . Appendectomy    . Cesarean section      x2  . Tubal ligation    . Knee ligament  reconstruction    . Laser throat polyps    . Neck lesion biopsy    . Patella fracture surgery    . Arthroscopic knee surgery    . Total knee arthroplasty  2012    Left  . Abdominal hysterectomy  2000    TAH RSO  . Esophageal dilation  04/09/2012  . Cholecystectomy    . Tonsillectomy    . Total knee arthroplasty Right 07/08/2012    Procedure: TOTAL KNEE ARTHROPLASTY with revision components ;  Surgeon: Hessie Dibble, MD;  Location: [facility];  Service: Orthopedics;  Laterality: Right;  PATIENT HAS HX. OF FACTOR IV DISORFER   Family History  Problem Relation Age of Onset  . Hypertension Mother   . Diabetes Mother   . Hyperlipidemia Mother   . Heart disease Father   . Hypertension Father   . Hyperlipidemia Father   . Hypertension Sister   . Cancer Paternal Aunt     colon  . Breast cancer Paternal Aunt 46  . Heart disease Maternal Grandfather   . Cancer Maternal Grandfather     Lymphoma  . Heart failure Maternal Grandfather   . Cancer Paternal Grandmother     colon  . Hyperlipidemia Brother   . Hypertension Brother   . Hypertension Brother   . Cancer Paternal Aunt     colon  .  Breast cancer Paternal Aunt 51  . Stroke Paternal Grandfather    History  Substance Use Topics  . Smoking status: Never Smoker   . Smokeless tobacco: Never Used  . Alcohol Use: Yes     Comment: Very rare   OB History   Grav Para Term Preterm Abortions TAB SAB Ect Mult Living   2 2 2       2      Review of Systems  Constitutional: Negative for fever.  HENT: Negative for congestion.   Eyes: Negative for redness.  Respiratory: Positive for cough and shortness of breath.   Cardiovascular: Positive for chest pain and leg swelling.  Gastrointestinal: Negative for nausea, vomiting and abdominal pain.  Genitourinary: Negative for dysuria.  Musculoskeletal: Negative for back pain.  Skin: Negative for rash.  Neurological: Negative for headaches.  Hematological: Does not bruise/bleed easily.   Psychiatric/Behavioral: Negative for confusion.    Allergies  Doxycycline; Erythromycin base; Morphine; and Vancomycin hcl  Home Medications   Current Outpatient Rx  Name  Route  Sig  Dispense  Refill  . cloNIDine (CATAPRES) 0.1 MG tablet   Oral   Take 0.1 mg by mouth 3 (three) times daily.          Marland Kitchen esomeprazole (NEXIUM) 40 MG capsule   Oral   Take 40 mg by mouth daily before breakfast.         . estradiol (ESTRACE) 1 MG tablet   Oral   Take 1.5 tablets (1.5 mg total) by mouth daily.   90 tablet   8   . fexofenadine (ALLEGRA) 180 MG tablet   Oral   Take 180 mg by mouth daily as needed (allergies).         . fluticasone (FLONASE) 50 MCG/ACT nasal spray   Nasal   Place 2 sprays into the nose daily as needed for allergies.          Marland Kitchen gabapentin (NEURONTIN) 600 MG tablet   Oral   Take 600 mg by mouth at bedtime.         Marland Kitchen ibuprofen (ADVIL,MOTRIN) 200 MG tablet   Oral   Take 400 mg by mouth every 6 (six) hours as needed for moderate pain.         Marland Kitchen ketotifen (ALAWAY) 0.025 % ophthalmic solution   Both Eyes   Place 2 drops into both eyes daily as needed (for irritation).          Marland Kitchen losartan (COZAAR) 50 MG tablet   Oral   Take 50 mg by mouth 2 (two) times daily.          . montelukast (SINGULAIR) 10 MG tablet   Oral   Take 10 mg by mouth at bedtime.         . Potassium Gluconate 550 MG TABS   Oral   Take 1 tablet by mouth 2 (two) times daily.         . simvastatin (ZOCOR) 40 MG tablet   Oral   Take 40 mg by mouth at bedtime.           . Vitamin D, Ergocalciferol, (DRISDOL) 50000 UNITS CAPS capsule   Oral   Take 50,000 Units by mouth every 7 (seven) days.         Marland Kitchen aspirin 81 MG chewable tablet   Oral   Chew 1 tablet (81 mg total) by mouth daily.   30 tablet   1    BP 147/89  Pulse 93  Temp(Src) 98 F (36.7 C) (Oral)  Resp 17  Ht 5\' 3"  (1.6 m)  Wt 220 lb (99.791 kg)  BMI 38.98 kg/m2  SpO2 99% Physical Exam  Nursing  note and vitals reviewed. Constitutional: She appears well-developed and well-nourished. No distress.  HENT:  Head: Normocephalic and atraumatic.  Mouth/Throat: Oropharynx is clear and moist.  Eyes: Conjunctivae and EOM are normal. Pupils are equal, round, and reactive to light.  Neck: Normal range of motion. Neck supple.  Cardiovascular: Normal rate, regular rhythm and normal heart sounds.   Pulmonary/Chest: Effort normal and breath sounds normal. No respiratory distress. She has no wheezes. She exhibits no tenderness.  Abdominal: Soft. Bowel sounds are normal. There is no tenderness.  Musculoskeletal: Normal range of motion. She exhibits no edema.  Neurological: She is alert. No cranial nerve deficit. She exhibits normal muscle tone. Coordination normal.  Skin: Skin is warm. No rash noted.    ED Course  Procedures (including critical care time) Labs Review Labs Reviewed  TROPONIN I  COMPREHENSIVE METABOLIC PANEL  CBC WITH DIFFERENTIAL   Results for orders placed during the hospital encounter of 06/01/13  TROPONIN I      Result Value Range   Troponin I <0.30  <0.30 ng/mL  COMPREHENSIVE METABOLIC PANEL      Result Value Range   Sodium 139  137 - 147 mEq/L   Potassium 4.3  3.7 - 5.3 mEq/L   Chloride 101  96 - 112 mEq/L   CO2 25  19 - 32 mEq/L   Glucose, Bld 91  70 - 99 mg/dL   BUN 10  6 - 23 mg/dL   Creatinine, Ser 0.68  0.50 - 1.10 mg/dL   Calcium 8.9  8.4 - 10.5 mg/dL   Total Protein 7.6  6.0 - 8.3 g/dL   Albumin 3.8  3.5 - 5.2 g/dL   AST 21  0 - 37 U/L   ALT 19  0 - 35 U/L   Alkaline Phosphatase 82  39 - 117 U/L   Total Bilirubin 0.3  0.3 - 1.2 mg/dL   GFR calc non Af Amer >90  >90 mL/min   GFR calc Af Amer >90  >90 mL/min  CBC WITH DIFFERENTIAL      Result Value Range   WBC 7.3  4.0 - 10.5 K/uL   RBC 4.14  3.87 - 5.11 MIL/uL   Hemoglobin 12.2  12.0 - 15.0 g/dL   HCT 36.8  36.0 - 46.0 %   MCV 88.9  78.0 - 100.0 fL   MCH 29.5  26.0 - 34.0 pg   MCHC 33.2  30.0 -  36.0 g/dL   RDW 12.0  11.5 - 15.5 %   Platelets 253  150 - 400 K/uL   Neutrophils Relative % 57  43 - 77 %   Neutro Abs 4.2  1.7 - 7.7 K/uL   Lymphocytes Relative 33  12 - 46 %   Lymphs Abs 2.4  0.7 - 4.0 K/uL   Monocytes Relative 6  3 - 12 %   Monocytes Absolute 0.5  0.1 - 1.0 K/uL   Eosinophils Relative 3  0 - 5 %   Eosinophils Absolute 0.2  0.0 - 0.7 K/uL   Basophils Relative 1  0 - 1 %   Basophils Absolute 0.0  0.0 - 0.1 K/uL    Imaging Review Ct Angio Chest Pe W/cm &/or Wo Cm  06/01/2013   CLINICAL DATA:  Intermittent chest pain.  Productive cough.  EXAM: CT ANGIOGRAPHY CHEST WITH CONTRAST  TECHNIQUE: Multidetector CT imaging of the chest was performed using the standard protocol during bolus administration of intravenous contrast. Multiplanar CT image reconstructions including MIPs were obtained to evaluate the vascular anatomy.  CONTRAST:  174mL OMNIPAQUE IOHEXOL 350 MG/ML SOLN  COMPARISON:  None.  FINDINGS: There is no filling defect within the opacified pulmonary arteries to suggest the presence of an acute pulmonary embolus. Motion artifact degrades assessment of segmental and subsegmental pulmonary arteries in the lower lobes, right greater than left. No thoracic aortic aneurysm. There is no dissection of the thoracic aorta.  Lung windows show no evidence for pulmonary edema. There is no focal airspace consolidation. No pulmonary parenchymal nodule or mass. Mild dependent atelectasis is seen in the posterior lower lungs.  Images which include the upper abdomen show changes of prior cholecystectomy. Bone windows reveal no worrisome lytic or sclerotic osseous lesions.  Review of the MIP images confirms the above findings.  IMPRESSION: No acute findings in the chest. Specifically, no evidence to explain the patient's history of intermittent chest pain and productive cough.   Electronically Signed   By: Misty Stanley M.D.   On: 06/01/2013 10:53    EKG Interpretation     Date/Time:  Wednesday June 01 2013 08:53:08 EST Ventricular Rate:  91 PR Interval:  120 QRS Duration: 86 QT Interval:  372 QTC Calculation: 457 R Axis:   54 Text Interpretation:  Normal sinus rhythm Normal ECG No significant change since last tracing Confirmed by Tashala Cumbo  MD, Byran Bilotti ([id number]) on 06/01/2013 8:58:30 AM            MDM   1. Chest pain   2. Dyspnea    Chest pain workup negative. CT angios negative for pulmonary embolism pneumonia pneumothorax or pulmonary edema. EKG without acute changes. Troponin is negative. He shouldn't been having chest pain on and off for about 2 months never has lasted more than a few minutes. Unlikely that is cardiac in nature. Patient does have cardiology followup. Will treat with baby aspirin a day have her followup with her primary care for recheck of the high blood pressure and followup with cardiology as scheduled. Patient's electrolytes were normal patient's white blood cell count is normal and no anemia.    Mervin Kung, MD 06/01/13 [phone]

## 2013-06-01 NOTE — ED Notes (Signed)
Cp for 1 1/2 months sob and swelling inlegs pain comes and goes sharp in nature that rads ro neck breast and arm

## 2013-06-02 ENCOUNTER — Encounter: Payer: Self-pay | Admitting: *Deleted

## 2013-06-03 ENCOUNTER — Ambulatory Visit (INDEPENDENT_AMBULATORY_CARE_PROVIDER_SITE_OTHER): Payer: BC Managed Care – PPO | Admitting: Cardiology

## 2013-06-03 ENCOUNTER — Encounter: Payer: Self-pay | Admitting: Cardiology

## 2013-06-03 ENCOUNTER — Encounter: Payer: Self-pay | Admitting: General Surgery

## 2013-06-03 VITALS — BP 153/94 | HR 76 | Ht 63.0 in | Wt 220.0 lb

## 2013-06-03 DIAGNOSIS — R0600 Dyspnea, unspecified: Secondary | ICD-10-CM

## 2013-06-03 DIAGNOSIS — R0602 Shortness of breath: Secondary | ICD-10-CM

## 2013-06-03 DIAGNOSIS — R0683 Snoring: Secondary | ICD-10-CM

## 2013-06-03 DIAGNOSIS — R0609 Other forms of dyspnea: Secondary | ICD-10-CM

## 2013-06-03 DIAGNOSIS — R609 Edema, unspecified: Secondary | ICD-10-CM

## 2013-06-03 DIAGNOSIS — R600 Localized edema: Secondary | ICD-10-CM

## 2013-06-03 DIAGNOSIS — R0989 Other specified symptoms and signs involving the circulatory and respiratory systems: Secondary | ICD-10-CM

## 2013-06-03 HISTORY — DX: Shortness of breath: R06.02

## 2013-06-03 MED ORDER — METOPROLOL SUCCINATE ER 25 MG PO TB24: 25.0000 mg | ORAL_TABLET | Freq: Every day | ORAL | Status: DC

## 2013-06-03 MED ORDER — HYDROCHLOROTHIAZIDE 25 MG PO TABS: 25.0000 mg | ORAL_TABLET | Freq: Every day | ORAL | Status: DC

## 2013-06-03 NOTE — Patient Instructions (Addendum)
Your physician has recommended you make the following change in your medication: 1. Start HCTZ 25 Mg 1 tablet daily 2. Start Toprol 25 MG 1 tablet daily  Your physician has requested that you have a lower  extremity arterial duplex. This test is an ultrasound of the arteries in the legs . It looks at arterial blood flow in the legs and arms. Allow one hour for Lower and Upper Arterial scans. There are no restrictions or special instructions  Your physician has recommended that you have a sleep study. This test records several body functions during sleep, including: brain activity, eye movement, oxygen and carbon dioxide blood levels, heart rate and rhythm, breathing rate and rhythm, the flow of air through your mouth and nose, snoring, body muscle movements, and chest and belly movement.( We will be setting this up with [employer])  Your physician has requested that you have an exercise stress myoview. For further information please visit [email or web address]. Please follow instruction sheet, as given.  Your physician recommends that you schedule a follow-up appointment the day after stress test to see Dr Meda Coffee.

## 2013-06-03 NOTE — Progress Notes (Signed)
Patient Name: Renee Flores Date of Encounter: 06/03/2013  Primary Care Provider:  Gennette Pac, MD Primary Cardiologist:  Dr Jenkins Rouge  Problem List   Past Medical History  Diagnosis Date  . Hypertension   . High cholesterol   . Osteopenia 02/2013    T score -1.3 FRAX not calculated due to patient on HRT  . Vitamin D deficiency 2014    17  . MS (multiple sclerosis)     questionable  . Factor 5 Leiden mutation, heterozygous   . Heart palpitations   . Heart valve problem     mild- moderate mitral valve leakage   . Complication of anesthesia     anxiety attack after waking up from anesthesia  . Heart murmur   . Arthritis   . GERD (gastroesophageal reflux disease)    Past Surgical History  Procedure Laterality Date  . Pelvic laparoscopy      LSO  . Chloecystectomy    . Appendectomy    . Cesarean section      x2  . Tubal ligation    . Knee ligament reconstruction    . Laser throat polyps    . Neck lesion biopsy    . Patella fracture surgery    . Arthroscopic knee surgery    . Total knee arthroplasty Left 2012  . Total abdominal hysterectomy w/ bilateral salpingoophorectomy  2000  . Esophageal dilation  04/09/2012  . Cholecystectomy    . Tonsillectomy    . Total knee arthroplasty Right 07/08/2012    Procedure: TOTAL KNEE ARTHROPLASTY with revision components ;  Surgeon: Hessie Dibble, MD;  Location: [facility];  Service: Orthopedics;  Laterality: Right;  PATIENT HAS HX. OF FACTOR IV DISORFER    Allergies  Allergies  Allergen Reactions  . Doxycycline Nausea Only  . Erythromycin Base Hives  . Morphine Other (See Comments)    Hallucinating   . Vancomycin Hcl Other (See Comments)    Ringing in ears    HPI  48 year old female with prior medical history of hypertension, hyperlipidemia, sclerosis multiplex, factor V Leiden mutation with significant family history of pulmonary embolism in her brother. The patient went to the ED on 06/01/2013 with concerns  of chest tightness and shortness of breath. She was ruled out for ACS and discharge home. Shortness of breath has been progressively worsening over last 2 months and she states that it's pretty constant but gets worse on exertion or sometimes it wakes her up at night. She also woke up one night with significant palpitations lasting a few minutes associated with shortness of breath. The patient states that she had a staph infection after her right knee replacement in November of 2014 after which her hypertension has been a problem. Her blood pressure medication has recently been increased by her primary care physician and clonidine was added. The patient also developed his lower extremity edema for last couple of weeks. She denies any syncope. The patient also mentions that she has been snoring at night and a she had weakness apneic episode that wake her up at night.  patient sent in from work for evaluation of chest pain. Patient has had chest pain for about 2 months it is intermittent brief not lasting 15 minutes or longer. Pain is a left chest breast area radiates to the left arm. Nonradiating other than to the arm. When is present its 8/10 currently it's her attend no pain. Patient describes pain as squeezing. Patient has known hypertension poor  control of late but her doctors been working on getting it under control. Also complaint of bilateral leg swelling. Patient has an appointment with cardiology coming up on February 15. Symptoms also associated with some shortness of breath and cough. Denies fevers.   48 yo referred by primary Mild dyspnea on exertion that sounds functional.  Sedentary and overweight  Family history of Facto 5 with brother having PE.  She tested positive recently but still needs to f/u with Dr Marin Olp.  Needs esophageal dilatation.  Chronic hiatal hernia and dysphagia.  Procedure canceled until "heart" ok and anticoagulation issues resolved.  No chest pain outside of belching from  dysphagia  No Clinical history of DVT or PE. Non smoker with no chronic lung disease.  Had left knee replacement last year with no difficulty  ROS: Denies fever, malais, weight loss, blurry vision, decreased visual acuity, cough, sputum, SOB, hemoptysis, pleuritic pain, palpitaitons, heartburn, abdominal pain, melena, lower extremity edema, claudication, or rash.  All other systems reviewed and negative   Home Medications  Prior to Admission medications   Medication Sig Start Date End Date Taking? Authorizing Provider  aspirin 81 MG chewable tablet Chew 1 tablet (81 mg total) by mouth daily. 06/01/13  Yes Mervin Kung, MD  cloNIDine (CATAPRES) 0.1 MG tablet Take 0.1 mg by mouth 3 (three) times daily.    Yes Historical Provider, MD  esomeprazole (NEXIUM) 40 MG capsule Take 40 mg by mouth daily before breakfast.   Yes Historical Provider, MD  estradiol (ESTRACE) 1 MG tablet Take 1.5 tablets (1.5 mg total) by mouth daily. 04/07/13  Yes Anastasio Auerbach, MD  fexofenadine (ALLEGRA) 180 MG tablet Take 180 mg by mouth daily as needed (allergies).   Yes Historical Provider, MD  fluticasone (FLONASE) 50 MCG/ACT nasal spray Place 2 sprays into the nose daily as needed for allergies.    Yes Historical Provider, MD  gabapentin (NEURONTIN) 600 MG tablet Take 600 mg by mouth at bedtime.   Yes Historical Provider, MD  ibuprofen (ADVIL,MOTRIN) 200 MG tablet Take 400 mg by mouth every 6 (six) hours as needed for moderate pain.   Yes Historical Provider, MD  ketotifen (ALAWAY) 0.025 % ophthalmic solution Place 2 drops into both eyes daily as needed (for irritation).    Yes Historical Provider, MD  losartan (COZAAR) 50 MG tablet Take 50 mg by mouth 2 (two) times daily.    Yes Historical Provider, MD  montelukast (SINGULAIR) 10 MG tablet Take 10 mg by mouth at bedtime.   Yes Historical Provider, MD  Potassium Gluconate 550 MG TABS Take 1 tablet by mouth 2 (two) times daily.   Yes Historical Provider, MD    simvastatin (ZOCOR) 40 MG tablet Take 40 mg by mouth at bedtime.     Yes Historical Provider, MD  Vitamin D, Ergocalciferol, (DRISDOL) 50000 UNITS CAPS capsule Take 50,000 Units by mouth every 7 (seven) days.   Yes Historical Provider, MD    Family History  Family History  Problem Relation Age of Onset  . Hypertension Mother   . Diabetes Mother   . Hyperlipidemia Mother   . Heart disease Father   . Hypertension Father   . Hyperlipidemia Father   . Hypertension Sister   . Colon cancer Paternal Aunt   . Breast cancer Paternal Aunt 2  . Heart disease Maternal Grandfather   . Lymphoma Maternal Grandfather   . Heart failure Maternal Grandfather   . Colon cancer Paternal Grandmother   . Hyperlipidemia Brother   .  Hypertension Brother   . Hypertension Brother   . Colon cancer Paternal Aunt   . Breast cancer Paternal Aunt 28  . Stroke Paternal Grandfather     Social History  History   Social History  . Marital Status: Divorced    Spouse Name: N/A    Number of Children: N/A  . Years of Education: N/A   Occupational History  . Not on file.   Social History Main Topics  . Smoking status: Never Smoker   . Smokeless tobacco: Never Used  . Alcohol Use: Yes     Comment: Very rare  . Drug Use: No  . Sexual Activity: Yes    Birth Control/ Protection: Surgical     Comment: HYST   Other Topics Concern  . Not on file   Social History Narrative  . No narrative on file     Review of Systems, as per HPI, otherwise negative General:  No chills, fever, night sweats or weight changes.  Cardiovascular:  No chest pain, dyspnea on exertion, edema, orthopnea, palpitations, paroxysmal nocturnal dyspnea. Dermatological: No rash, lesions/masses Respiratory: No cough, dyspnea Urologic: No hematuria, dysuria Abdominal:   No nausea, vomiting, diarrhea, bright red blood per rectum, melena, or hematemesis Neurologic:  No visual changes, wkns, changes in mental status. All other  systems reviewed and are otherwise negative except as noted above.  Physical Exam  Blood pressure 153/94, pulse 76, height 5\' 3"  (1.6 m), weight 220 lb (99.791 kg).  General: Pleasant, NAD Psych: Normal affect. Neuro: Alert and oriented X 3. Moves all extremities spontaneously. HEENT: Normal  Neck: Supple without bruits or JVD. Lungs:  Resp regular and unlabored, CTA. Heart: RRR no s3, s4, or murmurs. Abdomen: Soft, non-tender, non-distended, BS + x 4.  Extremities: No clubbing, cyanosis, mild edema B/L up to the knees. DP/PT/Radials 2+ and equal bilaterally.  Labs:  Recent Labs  06/01/13 0925  TROPONINI <0.30   Lab Results  Component Value Date   WBC 7.3 06/01/2013   HGB 12.2 06/01/2013   HCT 36.8 06/01/2013   MCV 88.9 06/01/2013   PLT 253 06/01/2013    Recent Labs Lab 06/01/13 0925  NA 139  K 4.3  CL 101  CO2 25  BUN 10  CREATININE 0.68  CALCIUM 8.9  PROT 7.6  BILITOT 0.3  ALKPHOS 82  ALT 19  AST 21  GLUCOSE 91   Accessory Clinical Findings  Echocardiogram - on 04/01/2012  - Left ventricle: The cavity size was normal. Wall thickness was normal. Systolic function was normal. The estimated ejection fraction was in the range of 55% to 60%. Wall motion was normal; there were no regional wall motion abnormalities. - Mitral valve: Mild to moderate regurgitation. - Left atrium: The atrium was mildly dilated. - Pulmonary arteries: Systolic pressure was mildly increased. PA peak pressure: 25mm Hg (S).  ECG - sinus rhythm, normal EKG on 06/01/2013   Assessment & Plan  A pleasant 48 year old female  1. shortness of breath  - The patient has multiple risk factors concerning for coronary artery disease including hypertension, hyperlipidemia, hypercoagulable state. Order an exercise nuclear stress test to rule out ischemia.  2. Hypertension - uncontrolled we will add hydrochlorothiazide 25 mg daily as well as Toprol XL 25 mg daily.  3. lower extremity edema - we'll  add hydrochlorothiazide 25 mg daily, we will order bilateral lower extremity venous duplex to rule out DVT concerning her history of factor V Leiden deficiency.  4. Sleep apnea - we will  refer for a sleep study  Followup after the stress test approximately 3 weeks.   Ena Dawley, Lemmie Evens, MD, [facility] 06/03/2013, 2:22 PM

## 2013-06-07 ENCOUNTER — Ambulatory Visit (HOSPITAL_COMMUNITY): Payer: BC Managed Care – PPO | Attending: Cardiology

## 2013-06-07 DIAGNOSIS — R0602 Shortness of breath: Secondary | ICD-10-CM | POA: Insufficient documentation

## 2013-06-07 DIAGNOSIS — M7989 Other specified soft tissue disorders: Secondary | ICD-10-CM | POA: Insufficient documentation

## 2013-06-07 DIAGNOSIS — R609 Edema, unspecified: Secondary | ICD-10-CM | POA: Insufficient documentation

## 2013-06-07 DIAGNOSIS — E669 Obesity, unspecified: Secondary | ICD-10-CM | POA: Insufficient documentation

## 2013-06-07 DIAGNOSIS — E785 Hyperlipidemia, unspecified: Secondary | ICD-10-CM | POA: Insufficient documentation

## 2013-06-07 DIAGNOSIS — I10 Essential (primary) hypertension: Secondary | ICD-10-CM | POA: Insufficient documentation

## 2013-06-07 DIAGNOSIS — M79609 Pain in unspecified limb: Secondary | ICD-10-CM

## 2013-06-15 ENCOUNTER — Ambulatory Visit: Payer: BC Managed Care – PPO | Admitting: Cardiovascular Disease

## 2013-06-21 ENCOUNTER — Telehealth: Payer: Self-pay | Admitting: Neurology

## 2013-06-21 DIAGNOSIS — I38 Endocarditis, valve unspecified: Secondary | ICD-10-CM

## 2013-06-21 DIAGNOSIS — G4710 Hypersomnia, unspecified: Secondary | ICD-10-CM

## 2013-06-21 DIAGNOSIS — G4730 Sleep apnea, unspecified: Secondary | ICD-10-CM

## 2013-06-21 DIAGNOSIS — I10 Essential (primary) hypertension: Secondary | ICD-10-CM

## 2013-06-21 NOTE — Telephone Encounter (Signed)
. °  Dr. Ena Dawley is referring Renee Flores, 48 y.o. female, for an attended sleep study.  Wt: 220 lbs Ht: 63 in. BMI: 39  Diagnoses: Excessive Daytime Sleepiness Witnessed Apneas Snoring Dyspnea Hypertension Hypercholesterolemia Morbid Obesity MS - questionable Moderate mitral valve leakage Heart palpitations Heart Murmur  Medication List: Current Outpatient Prescriptions  Medication Sig Dispense Refill   aspirin 81 MG chewable tablet Chew 1 tablet (81 mg total) by mouth daily.  30 tablet  1   cloNIDine (CATAPRES) 0.1 MG tablet Take 0.1 mg by mouth 3 (three) times daily.        esomeprazole (NEXIUM) 40 MG capsule Take 40 mg by mouth daily before breakfast.       estradiol (ESTRACE) 1 MG tablet Take 1.5 tablets (1.5 mg total) by mouth daily.  90 tablet  8   fexofenadine (ALLEGRA) 180 MG tablet Take 180 mg by mouth daily as needed (allergies).       fluticasone (FLONASE) 50 MCG/ACT nasal spray Place 2 sprays into the nose daily as needed for allergies.        gabapentin (NEURONTIN) 600 MG tablet Take 600 mg by mouth at bedtime.       hydrochlorothiazide (HYDRODIURIL) 25 MG tablet Take 1 tablet (25 mg total) by mouth daily.  90 tablet  3   ibuprofen (ADVIL,MOTRIN) 200 MG tablet Take 400 mg by mouth every 6 (six) hours as needed for moderate pain.       ketotifen (ALAWAY) 0.025 % ophthalmic solution Place 2 drops into both eyes daily as needed (for irritation).        losartan (COZAAR) 50 MG tablet Take 50 mg by mouth 2 (two) times daily.        metoprolol succinate (TOPROL XL) 25 MG 24 hr tablet Take 1 tablet (25 mg total) by mouth daily.  90 tablet  3   montelukast (SINGULAIR) 10 MG tablet Take 10 mg by mouth at bedtime.       Potassium Gluconate 550 MG TABS Take 1 tablet by mouth 2 (two) times daily.       simvastatin (ZOCOR) 40 MG tablet Take 40 mg by mouth at bedtime.         Vitamin D, Ergocalciferol, (DRISDOL) 50000 UNITS CAPS capsule Take 50,000 Units by  mouth every 7 (seven) days.       No current facility-administered medications for this visit.   This patient presents to Dr. Ena Dawley with a complaint of snoring and spells of apneic events that appear to wake her up at night.  Pt is morbidly obese who reports excessive daytime sleepiness, endorsing Epworth at 17 and also chronic fatigue.  Her hypertension is uncontrolled.  She awakens frequently with heart palpitations and shortness of breath.  Dr. Meda Coffee would like the patient to proceed with an attended sleep study to rule out osa.  Insurance:  [employer] - Prior authorization is not required.

## 2013-06-22 NOTE — Telephone Encounter (Signed)
Split study for obese female with EDS,snoring, witnessed apnea - Hx of  heart disease and valve disease.  Morning headaches  Split t lowest allowed point, supine sleep , CO2 and score at 3%  If AHI at or below 10, stay for MSLT - see epworth of 17 points.

## 2013-06-27 ENCOUNTER — Ambulatory Visit (HOSPITAL_COMMUNITY): Payer: BC Managed Care – PPO | Attending: Cardiology | Admitting: Radiology

## 2013-06-27 VITALS — BP 122/70 | HR 76 | Ht 63.0 in | Wt 218.0 lb

## 2013-06-27 DIAGNOSIS — R0989 Other specified symptoms and signs involving the circulatory and respiratory systems: Secondary | ICD-10-CM | POA: Insufficient documentation

## 2013-06-27 DIAGNOSIS — D6859 Other primary thrombophilia: Secondary | ICD-10-CM | POA: Insufficient documentation

## 2013-06-27 DIAGNOSIS — R0602 Shortness of breath: Secondary | ICD-10-CM | POA: Insufficient documentation

## 2013-06-27 DIAGNOSIS — E785 Hyperlipidemia, unspecified: Secondary | ICD-10-CM | POA: Insufficient documentation

## 2013-06-27 DIAGNOSIS — R002 Palpitations: Secondary | ICD-10-CM | POA: Insufficient documentation

## 2013-06-27 DIAGNOSIS — R0609 Other forms of dyspnea: Secondary | ICD-10-CM | POA: Insufficient documentation

## 2013-06-27 DIAGNOSIS — R011 Cardiac murmur, unspecified: Secondary | ICD-10-CM | POA: Insufficient documentation

## 2013-06-27 DIAGNOSIS — I10 Essential (primary) hypertension: Secondary | ICD-10-CM | POA: Insufficient documentation

## 2013-06-27 DIAGNOSIS — R079 Chest pain, unspecified: Secondary | ICD-10-CM | POA: Insufficient documentation

## 2013-06-27 DIAGNOSIS — R0600 Dyspnea, unspecified: Secondary | ICD-10-CM

## 2013-06-27 DIAGNOSIS — Z8249 Family history of ischemic heart disease and other diseases of the circulatory system: Secondary | ICD-10-CM | POA: Insufficient documentation

## 2013-06-27 MED ORDER — TECHNETIUM TC 99M SESTAMIBI GENERIC - CARDIOLITE
30.0000 | Freq: Once | INTRAVENOUS | Status: AC | PRN
  Administered 2013-06-27: 30 via INTRAVENOUS

## 2013-06-27 NOTE — Progress Notes (Signed)
[facility] [address] [place],  [address] [phone]    Cardiology Nuclear Med Study  Renee Flores is a 48 y.o. female     MRN : [id number]     DOB: July 11, 1965  Procedure Date: 06/27/2013  Nuclear Med Background Indication for Stress Test:  Evaluation for Ischemia and [facility] 2/15 with Chest Pain and SOB History:  No known CAD, Echo 2013 EF 55-60%, ?MS murmur, Factor 5 Leiden Cardiac Risk Factors: Family History - CAD, Hypertension and Lipids  Symptoms:  Chest Pain (last date of chest discomfort was 06/01/13), DOE, Palpitations and SOB   Nuclear Pre-Procedure Caffeine/Decaff Intake:  None NPO After: 7:00pm   Lungs:  clear O2 Sat: 92% on room air. IV 0.9% NS with Angio Cath:  22g  IV Site: R Wrist  IV Started by:  Matilde Haymaker, RN  Chest Size (in):  42 Cup Size: D  Height: 5\' 3"  (1.6 m)  Weight:  218 lb (98.884 kg)  BMI:  Body mass index is 38.63 kg/(m^2). Tech Comments:  No Toprol x 36 hrs    Nuclear Med Study 1 or 2 day study: 2 day  Stress Test Type:  Stress  Reading MD: n/a  Order Authorizing Provider:  Filiberto Pinks  Resting Radionuclide: Technetium 56m Sestamibi  Resting Radionuclide Dose: 33.0 mCi on 06/28/13   Stress Radionuclide:  Technetium 52m Sestamibi  Stress Radionuclide Dose: 33.0 mCi on 06/27/13           Stress Protocol Rest HR: 76 Stress HR: 166  Rest BP: 122/70 Stress BP: 164/86  Exercise Time (min): 5:00 METS: 6.5           Dose of Adenosine (mg):  n/a Dose of Lexiscan: n/a mg  Dose of Atropine (mg): n/a Dose of Dobutamine: n/a mcg/kg/min (at max HR)  Stress Test Technologist: Glade Lloyd, BS-ES  Nuclear Technologist:  Charlton Amor, CNMT     Rest Procedure:  Myocardial perfusion imaging was performed at rest 45 minutes following the intravenous administration of Technetium 43m Sestamibi. Rest ECG: NSR - Normal EKG  Stress Procedure:  The patient exercised on the treadmill utilizing  the Bruce Protocol for 5:00 minutes. The patient stopped due to SOB and denied any chest pain.  Technetium 63m Sestamibi was injected at peak exercise and myocardial perfusion imaging was performed after a brief delay. Stress ECG: 11mm of horizontal to upsloping ST segment depression at peak exercise  QPS Raw Data Images:  Mild breast attenuation.  Normal left ventricular size. Stress Images:  There is a very small mild area of decreased uptake in the apical septum. Rest Images:  Normal homogeneous uptake in all areas of the myocardium. Subtraction (SDS):  cannot rule out a very small area of ischemia in the apical septum but favor breast attenuation Transient Ischemic Dilatation (Normal <1.22):  0.83 Lung/Heart Ratio (Normal <0.45):  0.36  Quantitative Gated Spect Images QGS EDV:  80 ml QGS ESV:  27 ml  Impression Exercise Capacity:  Fair exercise capacity. BP Response:  Normal blood pressure response. Clinical Symptoms:  There is dyspnea. ECG Impression:  49mm of horizontal to upsloping ST segment depression at peak exercise Comparison with Prior Nuclear Study: No previous nuclear study performed  Overall Impression:   Moderate risk stress nuclear study with equivocal EKG for ischemia and very small apical septal reversible defect most consistent with variations in breast attenuation.  LV Ejection Fraction: 66%.  LV Wall Motion:  NL  LV Function; NL Wall Motion  Signed: Fransico Him, MD 06/28/2013

## 2013-06-28 ENCOUNTER — Ambulatory Visit (HOSPITAL_COMMUNITY): Payer: BC Managed Care – PPO | Attending: Internal Medicine

## 2013-06-28 DIAGNOSIS — R0989 Other specified symptoms and signs involving the circulatory and respiratory systems: Secondary | ICD-10-CM

## 2013-06-28 MED ORDER — TECHNETIUM TC 99M SESTAMIBI GENERIC - CARDIOLITE
33.0000 | Freq: Once | INTRAVENOUS | Status: AC | PRN
  Administered 2013-06-28: 33 via INTRAVENOUS

## 2013-06-29 ENCOUNTER — Ambulatory Visit: Payer: BC Managed Care – PPO | Admitting: Cardiology

## 2013-06-30 NOTE — Telephone Encounter (Signed)
Patient returning Horton Chin RN phone call about results.  Please call patient.  She is at work, call cell number.

## 2013-06-30 NOTE — Telephone Encounter (Signed)
Pt aware of test results. Debbie Jesstin Studstill RN  

## 2013-07-06 ENCOUNTER — Ambulatory Visit: Payer: BC Managed Care – PPO | Admitting: Cardiology

## 2013-07-14 ENCOUNTER — Ambulatory Visit (INDEPENDENT_AMBULATORY_CARE_PROVIDER_SITE_OTHER): Payer: BC Managed Care – PPO

## 2013-07-14 DIAGNOSIS — G4710 Hypersomnia, unspecified: Secondary | ICD-10-CM

## 2013-07-14 DIAGNOSIS — G4733 Obstructive sleep apnea (adult) (pediatric): Secondary | ICD-10-CM

## 2013-07-14 DIAGNOSIS — G4730 Sleep apnea, unspecified: Secondary | ICD-10-CM

## 2013-07-14 DIAGNOSIS — I38 Endocarditis, valve unspecified: Secondary | ICD-10-CM

## 2013-07-14 DIAGNOSIS — I10 Essential (primary) hypertension: Secondary | ICD-10-CM

## 2013-07-19 ENCOUNTER — Ambulatory Visit: Payer: BC Managed Care – PPO | Admitting: Cardiology

## 2013-07-22 ENCOUNTER — Telehealth: Payer: Self-pay | Admitting: Neurology

## 2013-07-22 ENCOUNTER — Encounter: Payer: Self-pay | Admitting: *Deleted

## 2013-07-22 ENCOUNTER — Other Ambulatory Visit: Payer: Self-pay | Admitting: *Deleted

## 2013-07-22 DIAGNOSIS — G4733 Obstructive sleep apnea (adult) (pediatric): Secondary | ICD-10-CM

## 2013-07-22 NOTE — Telephone Encounter (Signed)
I called and left a message for the patient about her recent sleep study results. I informed the patient that the study revealed mild obstructive sleep, associated with hypoxia and Dr. Brett Fairy recommend treatment in the form of CPAP. I Informed the patient that this will required a repeat study for the proper titration and mask fitting. I will fax Dr. Francesca Oman office a copy of the report and mail a copy to the patient.

## 2013-08-02 ENCOUNTER — Other Ambulatory Visit: Payer: Self-pay | Admitting: *Deleted

## 2013-08-17 ENCOUNTER — Ambulatory Visit (INDEPENDENT_AMBULATORY_CARE_PROVIDER_SITE_OTHER): Payer: BC Managed Care – PPO

## 2013-08-17 DIAGNOSIS — G4733 Obstructive sleep apnea (adult) (pediatric): Secondary | ICD-10-CM

## 2013-08-30 ENCOUNTER — Encounter: Payer: Self-pay | Admitting: *Deleted

## 2013-08-30 ENCOUNTER — Telehealth: Payer: Self-pay | Admitting: Neurology

## 2013-08-30 DIAGNOSIS — G4733 Obstructive sleep apnea (adult) (pediatric): Secondary | ICD-10-CM

## 2013-08-30 NOTE — Telephone Encounter (Signed)
I called and left a message for the patient about her recent CPAP titration study results. I informed the patient that she did well on CPAP during the night of her study with significant improvement of respiratory events. Dr. Brett Fairy recommend starting CPAP therapy at home, so I will fax the order to [facility]. I will fax a copy of the report to Dr. Merrily Pew office and mail a copy to the patient along with a follow up instruction letter.

## 2013-10-14 ENCOUNTER — Other Ambulatory Visit: Payer: Self-pay | Admitting: Neurology

## 2013-10-19 ENCOUNTER — Telehealth: Payer: Self-pay | Admitting: Neurology

## 2013-10-19 MED ORDER — GABAPENTIN 600 MG PO TABS: ORAL_TABLET | ORAL | Status: DC

## 2013-10-19 NOTE — Telephone Encounter (Signed)
Pt called states she won't get her medication gabapentin (NEURONTIN) 600 MG tablet until the 27th in the mail and she has been out for a week now. Pt wants to know if Dr. Brett Fairy could call in some that will get her through next week, if so please call them in to [facility] in [place] and pt would like a call back when this has been called in. Thanks

## 2013-10-19 NOTE — Telephone Encounter (Signed)
Rx has been sent to local pharmacy while patient waits on mail order.  I called the patient back.  Got no answer.  Left message.

## 2013-10-27 ENCOUNTER — Encounter: Payer: Self-pay | Admitting: *Deleted

## 2013-11-03 ENCOUNTER — Encounter: Payer: Self-pay | Admitting: Neurology

## 2013-11-03 ENCOUNTER — Ambulatory Visit (INDEPENDENT_AMBULATORY_CARE_PROVIDER_SITE_OTHER): Payer: BC Managed Care – PPO | Admitting: Neurology

## 2013-11-03 VITALS — BP 153/92 | HR 77 | Resp 16 | Ht 62.5 in | Wt 225.0 lb

## 2013-11-03 DIAGNOSIS — M2619 Other specified anomalies of jaw-cranial base relationship: Secondary | ICD-10-CM | POA: Insufficient documentation

## 2013-11-03 DIAGNOSIS — G4710 Hypersomnia, unspecified: Secondary | ICD-10-CM | POA: Insufficient documentation

## 2013-11-03 DIAGNOSIS — G4730 Sleep apnea, unspecified: Principal | ICD-10-CM

## 2013-11-03 DIAGNOSIS — M2610 Unspecified anomaly of jaw-cranial base relationship: Secondary | ICD-10-CM

## 2013-11-03 HISTORY — DX: Morbid (severe) obesity due to excess calories: E66.01

## 2013-11-03 HISTORY — DX: Other specified anomalies of jaw-cranial base relationship: M26.19

## 2013-11-03 HISTORY — DX: Hypersomnia, unspecified: G47.10

## 2013-11-03 MED ORDER — GABAPENTIN 600 MG PO TABS: 600.0000 mg | ORAL_TABLET | Freq: Every day | ORAL | Status: DC

## 2013-11-03 NOTE — Patient Instructions (Signed)
narcolepsyNarcolepsy Narcolepsy is a disabling neurological disorder of sleep regulation. It affects the control of sleep. It also affects the control of wakefulness. It is an interruption of the dreaming state of sleep. This state is known as REM or rapid eye movement sleep.  SYMPTOMS  The development, number, and severity of symptoms vary widely among people with the disorder. Symptoms generally begin between the ages of 74 and 68. The four classic symptoms of the disorder are:   Excessive daytime sleepiness.  Cataplexy. This is sudden, brief episodes of muscle weakness or paralysis. It is caused by strong emotions. Common strong emotions are laughter, anger, surprise, or anticipation.  Sleep paralysis. This is paralysis upon falling asleep or waking up.  Hallucinations. These are vivid dream-like images that occur at when you first fall asleep. Other symptoms include:   Unrelenting excessive sleepiness. This is usually the first and most obvious symptom.  Sleep attacks. Patients have strong sleep attacks throughout the day. These attacks can last for 30 seconds to more than 30 minutes. These happen no matter how much or how well the person slept the night before. These attacks end up making the person sleep at work and social events. The person can fall asleep while eating, talking, and driving. They also fall asleep at other out of place times.  Disturbed nighttime sleep.  Tossing and turning in bed.  Leg jerks.  Nightmares.  Waking up often. DIAGNOSIS  It's possible that genetics play a role in this disorder. Narcolepsy is not a rare disorder. It is often misdiagnosed. It is often diagnosed years after symptoms first appear. Early diagnosis and treatment are important. This help the physical and mental well-being of the patient. TREATMENT  There is no cure for narcolepsy. The symptoms can be controlled with behavioral and medical therapy. The excessive daytime sleepiness may be  treated with stimulant drugs. It may also be treated with the drug modafinil (Provigil). Cataplexy and other REM-sleep symptoms may be treated with antidepressant medications. Medications will reduce the symptoms. Medications will not ease symptoms entirely. Many available medications have side effects. Basic lifestyle changes may also reduce the symptoms. These changes include having regular sleep schedules and scheduled daytime naps. Other lifestyle changes include avoiding "over-stimulating" situations. Document Released: 04/04/2002 Document Revised: 07/07/2011 Document Reviewed: 04/06/2013 [facility] Patient Information 2015 [place], [place]. This information is not intended to replace advice given to you by your health care provider. Make sure you discuss any questions you have with your health care provider.

## 2013-11-03 NOTE — Progress Notes (Signed)
[name]  Provider:  [name], M D  Referring Provider: [name], MD   Chief Complaint  Patient presents with  . Follow-up    Room 10  . Hypersomnia    HPI:  Renee Flores is a 48 y.o. female  Is seen here as a referral  from [name] , MD , for a sleep clinic follow up.   Her last visit was with Renee Flores , [facility] in  2013. I have not seen this patient before.    Renee Flores has been seen yearly in followup with for a history of headaches, cervical tenderness and right-sided weakness of unknown etiology. She had been followed at [facility] since July 2001, [name] . The patient had been evaluated for mini strokes and multiple sclerosis. An MRI study was normal conduction and EMGs she studies did not show peripheral neuropathy, a lumbar puncture and evaluation of cerebral spinal fluid did not show oligoclonal banding and was negative for Lyme disease as well. [name]  At [facility] saw her for a second opinion but did not feel that she had multiple sclerosis. She was initiated on gabapentin in 2001 and repeat MRIs  (the latest of March 2005 ) were  Normal.  She was followed [name] and then CM-  For joint pain, difficult sleep it sleeping due to anxiety and depression but her daytime sleepiness became an impairment for her daily functioning and productivity. The headaches are now in good control: Renee Flores, [facility] opted for a sleep study as indicated to further evaluate the reason for the excessive daytime hypersomnia.   Patient goals to bed between 10 PM and 10.30, but it may take an hour to reach sustained sleep, waking frequently.   Dreams; yes  , but not recalling dreams, some dream intrusion, no sleep paralysis, no cataplexy.  The patient is to rise at 4 AM to her nocturnal sleep time is less than 6 hours. She will take afternoon- Naps during the week in frequently, but she does now on weekends. She says she has a hard time waking up in  the morning on arising off but she feels not refreshed and would like to take another 20 or 30 minutes of sleep time after a lumbar water. He relies on alarm. She endorsed today the Epworth sleepiness score at 18 fatigue at 53 points.     [name] M.D. is the referring physician for the sleep study discussed at today's visit.  The study was performed on 07-14-13. The patient's  Epworth sleepiness score at 19 points the patient health questionnaire at 18 points indicating also a moderate severe depression.  BMI was 39.0,  neck circumference measured 14.5 inches.  The patient's AHI was 6.7 and the RDI 7.0 during REM sleep -however AHI was 36 and in supine sleep her AHI was 30.4 per hour of sleep.  Oxygen nadir was 74% for total time of 17.8 minutes. Heart rate was regular and normal sinus rhythm. There were not many PLMs. The study revealed such mild obstructive sleep apnea but I discussed it with CPAP should even be performed. Positive pressure therapy is an option given that the patient has excessive daytime sleepiness and I ordered for her to undergo CPAP titration f then see [place] for a revisit in 30 days of use.   On-22-13 the patient underwent CPAP titration with reduced her AHI to 0.3 and the oxygen nadir rose to 91%. She was titrated to 7 cm  CPAP at night and slept 183 minutes of which 32 minutes were in REM sleep. She had some restless legs now, leg cramping at home.     This visit was scheduled for today. The patient only received confirmation that her CPAP will be delivered Thursday the 16th.  I had [facility] an CPAP through [facility] -  After 30 days of  CPAP use we wanted to re-evaluate the degree of sleepiness and follow with an MSLT as needed.      Review of Systems: Out of a complete 14 system review, the patient complains of only the following symptoms, and all other reviewed systems are negative. Retrognathia, physicaly active as not to fall asleep.  Drives with radio  full blast, window open, drinking caffeine, soda, chewing gum and ice cubes. She shouldn't drive. Marland Kitchen    History   Social History  . Marital Status: Married    Spouse Name: [name]    Number of Children: 2  . Years of Education: 12   Occupational History  .  [employer]   Social History Main Topics  . Smoking status: Never Smoker   . Smokeless tobacco: Never Used  . Alcohol Use: Yes     Comment: Very rare  . Drug Use: No  . Sexual Activity: Yes    Birth Control/ Protection: Surgical     Comment: HYST   Other Topics Concern  . Not on file   Social History Narrative   [name]  and lives at home with her husband.   Patient has two children.   Patient works as a Mudlogger for [employer].   Patient drinks one cup of caffeine daily.   Patient is right-handed.   Patient has a high school education.    Family History  Problem Relation Age of Onset  . Hypertension Mother   . Diabetes Mother   . Hyperlipidemia Mother   . Heart disease Father   . Hypertension Father   . Hyperlipidemia Father   . Hypertension Sister   . Colon cancer Paternal Aunt   . Breast cancer Paternal Aunt 34  . Heart disease Maternal Grandfather   . Lymphoma Maternal Grandfather   . Heart failure Maternal Grandfather   . Colon cancer Paternal Grandmother   . Hyperlipidemia Brother   . Hypertension Brother   . Hypertension Brother   . Colon cancer Paternal Aunt   . Breast cancer Paternal Aunt 34  . Stroke Paternal Grandfather   . Cancer Father   . Other Father     Respiratory problems  . Other Father     Factor 5 disorder    Past Medical History  Diagnosis Date  . Hypertension   . High cholesterol   . Osteopenia 02/2013    T score -1.3 FRAX not calculated due to patient on HRT  . Vitamin D deficiency 2014    18  . MS (multiple sclerosis)     questionable  . Factor 5 Leiden mutation, heterozygous   . Heart palpitations   . Heart valve problem      mild- moderate mitral valve leakage   . Complication of anesthesia     anxiety attack after waking up from anesthesia  . Heart murmur   . Arthritis   . GERD (gastroesophageal reflux disease)     Past Surgical History  Procedure Laterality Date  . Pelvic laparoscopy      LSO  . Chloecystectomy    .  Appendectomy    . Cesarean section      x2  . Tubal ligation    . Knee ligament reconstruction    . Laser throat polyps    . Neck lesion biopsy    . Patella fracture surgery    . Arthroscopic knee surgery    . Total knee arthroplasty Left 2012  . Total abdominal hysterectomy w/ bilateral salpingoophorectomy  2000  . Esophageal dilation  04/09/2012  . Cholecystectomy    . Tonsillectomy    . Total knee arthroplasty Right 07/08/2012    Procedure: TOTAL KNEE ARTHROPLASTY with revision components ;  Surgeon: Hessie Dibble, MD;  Location: [place];  Service: Orthopedics;  Laterality: Right;  PATIENT HAS HX. OF FACTOR IV DISORFER    Current Outpatient Prescriptions  Medication Sig Dispense Refill  . aspirin 81 MG chewable tablet Chew 1 tablet (81 mg total) by mouth daily.  30 tablet  1  . Calcium Carb-Cholecalciferol (CALCIUM + D3) 600-200 MG-UNIT TABS Take 1 tablet by mouth daily.      . cloNIDine (CATAPRES) 0.1 MG tablet Take 0.1 mg by mouth 3 (three) times daily.       Marland Kitchen esomeprazole (NEXIUM) 40 MG capsule Take 40 mg by mouth daily before breakfast.      . estradiol (ESTRACE) 1 MG tablet Take 1.5 tablets (1.5 mg total) by mouth daily.  90 tablet  8  . fexofenadine (ALLEGRA) 180 MG tablet Take 180 mg by mouth daily as needed (allergies).      . fluticasone (FLONASE) 50 MCG/ACT nasal spray Place 2 sprays into the nose daily as needed for allergies.       Marland Kitchen gabapentin (NEURONTIN) 600 MG tablet Take 600 mg by mouth at bedtime.      . gabapentin (NEURONTIN) 600 MG tablet TAKE 1 BY MOUTH AT BEDTIME  30 tablet  0  . hydrochlorothiazide (HYDRODIURIL) 25 MG tablet Take 1 tablet (25 mg total) by  mouth daily.  90 tablet  3  . ibuprofen (ADVIL,MOTRIN) 200 MG tablet Take 400 mg by mouth every 6 (six) hours as needed for moderate pain.      Marland Kitchen ketotifen (ALAWAY) 0.025 % ophthalmic solution Place 2 drops into both eyes daily as needed (for irritation).       Marland Kitchen losartan (COZAAR) 50 MG tablet Take 50 mg by mouth 2 (two) times daily.       . meloxicam (MOBIC) 15 MG tablet Take 15 mg by mouth daily.      . metoprolol succinate (TOPROL XL) 25 MG 24 hr tablet Take 1 tablet (25 mg total) by mouth daily.  90 tablet  3  . montelukast (SINGULAIR) 10 MG tablet Take 10 mg by mouth at bedtime.      . Potassium Gluconate 550 MG TABS Take 1 tablet by mouth 2 (two) times daily.      . simvastatin (ZOCOR) 40 MG tablet Take 40 mg by mouth at bedtime.         No current facility-administered medications for this visit.    Allergies as of 11/03/2013 - Review Complete 11/03/2013  Allergen Reaction Noted  . Erythromycin base Hives   . Morphine Other (See Comments)   . Vancomycin hcl Other (See Comments) 02/12/2010  . Doxycycline Nausea Only and Rash 06/28/2012  . Erythromycin Rash 11/03/2013    Vitals: BP 153/92  Pulse 77  Resp 16  Ht 5' 2.5" (1.588 m)  Wt 225 lb (102.059 kg)  BMI 40.47 kg/m2  Last Weight:  Wt Readings from Last 1 Encounters:  11/03/13 225 lb (102.059 kg)   Last Height:   Ht Readings from Last 1 Encounters:  11/03/13 5' 2.5" (1.588 m)    Physical exam:  General: The patient is awake, alert and appears not in acute distress. The patient is well groomed. Head: Normocephalic, atraumatic. Neck is supple. Mallampati 3 , neck circumference: 14.5 .  Retrognathia. Patient used retainer in childhood.  Cardiovascular:  Regular rate and rhythm , without  murmurs or carotid bruit, and without distended neck veins. Respiratory: Lungs are clear to auscultation. Skin:  Without evidence of edema, or rash Trunk: BMI is elevated and patient  has normal posture.  Neurologic exam : The  patient is awake and alert, oriented to place and time.  Memory subjective  described as intact.  Cranial nerves: Pupils are equal and briskly reactive to light. Funduscopic exam without   evidence of pallor or edema.  Extraocular movements  in vertical and horizontal planes intact and without nystagmus. Visual fields by finger perimetry are intact. Hearing to finger rub intact.  Facial sensation intact to fine touch. Facial motor strength is symmetric and tongue and uvula move midline.  Motor exam:   Normal tone, muscle bulk and symmetric normal strength in all extremities. There is a mild foot tightness, limited range of strength to left right foot at the ankle.   Sensory:  Fine touch, pinprick and vibration were tested in all extremities. Proprioception is  normal.  Coordination: Rapid alternating movements in the fingers/hands is tested and normal.  Finger-to-nose maneuver tested and normal without evidence of ataxia, dysmetria or tremor.  Gait and station: Patient walks without assistive device . Deep tendon reflexes: in the  upper and lower extremities are symmetric and intact.  Babinski maneuver response is down going on the left and equivocal on the right .   Assessment:  After physical and neurologic examination, review of laboratory studies, imaging, neurophysiology testing and pre-existing records, assessment is ;  This patient has mild OSA, obesity and retrognathia.   she was titrated to 7 cm water , but has changed DME in the process and not gotten her machine yet. There was no possibility to check on compliance and Epworth under treatment.  RLS- see if it resolved under CPAP.       Plan:  Treatment plan and additional workup : after 30 days on CPAP, follow with NP and get new Epworth score, if not improved  : need PSG on CPAP and MSLT to follow patient will see carolyn martin in follow up. I will see her for sleep.

## 2013-11-21 ENCOUNTER — Telehealth: Payer: Self-pay | Admitting: Neurology

## 2013-11-21 NOTE — Telephone Encounter (Signed)
Left message that blood test for HLA typing for narcolepsy was negative.  Asked to call if she any questions.

## 2013-11-23 ENCOUNTER — Encounter: Payer: Self-pay | Admitting: Neurology

## 2013-12-02 ENCOUNTER — Encounter: Payer: Self-pay | Admitting: Gynecology

## 2014-01-11 ENCOUNTER — Encounter: Payer: Self-pay | Admitting: Neurology

## 2014-01-17 ENCOUNTER — Ambulatory Visit: Payer: BC Managed Care – PPO | Admitting: Nurse Practitioner

## 2014-01-20 ENCOUNTER — Encounter: Payer: Self-pay | Admitting: Nurse Practitioner

## 2014-01-20 ENCOUNTER — Encounter: Payer: Self-pay | Admitting: Neurology

## 2014-01-20 ENCOUNTER — Ambulatory Visit (INDEPENDENT_AMBULATORY_CARE_PROVIDER_SITE_OTHER): Payer: BC Managed Care – PPO | Admitting: Nurse Practitioner

## 2014-01-20 VITALS — BP 114/76 | HR 74 | Ht 63.0 in | Wt 226.0 lb

## 2014-01-20 DIAGNOSIS — G4730 Sleep apnea, unspecified: Principal | ICD-10-CM

## 2014-01-20 DIAGNOSIS — G4710 Hypersomnia, unspecified: Secondary | ICD-10-CM

## 2014-01-20 MED ORDER — GABAPENTIN 600 MG PO TABS: 600.0000 mg | ORAL_TABLET | Freq: Every day | ORAL | Status: DC

## 2014-01-20 NOTE — Patient Instructions (Signed)
Good compliance no adjustment necessary Continue Gabapentin at hs F/U yearly with me F/U 6 months with Dr. Brett Fairy

## 2014-01-20 NOTE — Progress Notes (Signed)
[name]  PATIENT: Renee Flores DOB: Aug 06, 1965   REASON FOR VISIT: Followup for headaches and hypersomnia   HISTORY OF PRESENT ILLNESS: Mrs. Renee Flores has been seen yearly in followup with for a history of headaches, cervical tenderness and right-sided weakness of unknown etiology. She had been followed at [facility] since July 2001, [name] .  The patient had been evaluated for mini strokes and multiple sclerosis. An MRI study was normal . Nerve conduction and EMGs  studies did not show peripheral neuropathy, a lumbar puncture and evaluation of cerebral spinal fluid did not show oligoclonal banding and was negative for Lyme disease as well. [name] [facility] saw her for a second opinion but did not feel that she had multiple sclerosis. She was initiated on gabapentin in 2001 and repeat MRIs (the latest of March 2005 ) were Normal.  She was followed [name] and then CM- For joint pain, difficult sleep it sleeping due to anxiety and depression but her daytime sleepiness became an impairment for her daily functioning and productivity. The headaches are now in good control: Renee Flores, [facility] opted for a sleep study as indicated to further evaluate the reason for the excessive daytime hypersomnia.  Patient goes to bed between 10 PM and 10.30, but it may take an hour to reach sustained sleep, waking frequently.  Dreams; yes , but not recalling dreams, some dream intrusion, no sleep paralysis, no cataplexy.  The patient is to rise at 4 AM to her nocturnal sleep time is less than 6 hours. She will take afternoon- Naps during the week in frequently, but she does now on weekends. She says she has a hard time waking up in the morning on arising off but she feels not refreshed and would like to take another 20 or 30 minutes of sleep time after a lumbar water. He relies on alarm. She endorsed today the Epworth sleepiness score at 18 fatigue at 53 points.  Renee Flores M.D. is the  referring physician for the sleep study discussed at today's visit.  The study was performed on 07-14-13. The patient's Epworth sleepiness score at 19 points the patient health questionnaire at 18 points indicating also a moderate severe depression. BMI was 39.0, neck circumference measured 14.5 inches.  The patient's AHI was 6.7 and the RDI 7.0 during REM sleep -however AHI was 36 and in supine sleep her AHI was 30.4 per hour of sleep.  Oxygen nadir was 74% for total time of 17.8 minutes. Heart rate was regular and normal sinus rhythm. There were not many PLMs. The study revealed such mild obstructive sleep apnea but I discussed it with CPAP should even be performed.  Positive pressure therapy is an option given that the patient has excessive daytime sleepiness and I ordered for her to undergo CPAP titration f then see [place] for a revisit in 30 days of use.  On-22-13 the patient underwent CPAP titration with reduced her AHI to 0.3 and the oxygen nadir rose to 91%. She was titrated to 7 cm CPAP at night and slept 183 minutes of which 32 minutes were in REM sleep. She had some restless legs now, leg cramping at home.  This visit was scheduled for today. The patient only received confirmation that her CPAP will be delivered Thursday the 16th.  I had [facility] an CPAP through [facility] - After 30 days of CPAP use we wanted to re-evaluate the degree of sleepiness and follow with an MSLT as  needed.  UPDATE 01/20/14 [name] 48 year old female returns for followup. She was last seen by me 04/09/2012 and most recently Dr. Brett Fairy 11/03/2013. She has been on CPAP for 30 days and feels much less fatigued. When she awakens in the morning she feels more rested. According to her down load read by Dr. Brett Fairy she needs no adjustment in her CPAP currently at 7 cm. She remains on her gabapentin which has improved her headaches. She returns for reevaluation     REVIEW OF SYSTEMS: Full 14 system review of systems  performed and notable only for those listed, all others are neg:  Constitutional: N/A  Cardiovascular: N/A  Ear/Nose/Throat: N/A  Skin: N/A  Eyes: Blurred vision Respiratory: N/A  Gastroitestinal: N/A  Hematology/Lymphatic: N/A  Endocrine: N/A Musculoskeletal: Joint pain, neck stiffness Allergy/Immunology: N/A  Neurological: Weakness Psychiatric: N/A Sleep : Restless legs  ALLERGIES: Allergies  Allergen Reactions  . Erythromycin Base Hives  . Morphine Other (See Comments)    Hallucinating   . Vancomycin Hcl Other (See Comments)    Ringing in ears  . Doxycycline Nausea Only and Rash  . Erythromycin Rash    HOME MEDICATIONS: Outpatient Prescriptions Prior to Visit  Medication Sig Dispense Refill  . Calcium Carb-Cholecalciferol (CALCIUM + D3) 600-200 MG-UNIT TABS Take 1 tablet by mouth daily.      . cloNIDine (CATAPRES) 0.1 MG tablet Take 0.1 mg by mouth 3 (three) times daily.       Marland Kitchen esomeprazole (NEXIUM) 40 MG capsule Take 40 mg by mouth daily before breakfast.      . estradiol (ESTRACE) 1 MG tablet Take 1.5 tablets (1.5 mg total) by mouth daily.  90 tablet  8  . fluticasone (FLONASE) 50 MCG/ACT nasal spray Place 2 sprays into the nose daily as needed for allergies.       Marland Kitchen gabapentin (NEURONTIN) 600 MG tablet Take 1 tablet (600 mg total) by mouth at bedtime.  90 tablet  3  . hydrochlorothiazide (HYDRODIURIL) 25 MG tablet Take 1 tablet (25 mg total) by mouth daily.  90 tablet  3  . ketotifen (ALAWAY) 0.025 % ophthalmic solution Place 2 drops into both eyes daily as needed (for irritation).       Marland Kitchen losartan (COZAAR) 50 MG tablet Take 50 mg by mouth 2 (two) times daily.       . meloxicam (MOBIC) 15 MG tablet Take 15 mg by mouth daily.      . metoprolol succinate (TOPROL XL) 25 MG 24 hr tablet Take 1 tablet (25 mg total) by mouth daily.  90 tablet  3  . montelukast (SINGULAIR) 10 MG tablet Take 10 mg by mouth at bedtime.      . Potassium Gluconate 550 MG TABS Take 1 tablet by  mouth 2 (two) times daily.      . simvastatin (ZOCOR) 40 MG tablet Take 40 mg by mouth at bedtime.        Marland Kitchen aspirin 81 MG chewable tablet Chew 1 tablet (81 mg total) by mouth daily.  30 tablet  1  . fexofenadine (ALLEGRA) 180 MG tablet Take 180 mg by mouth daily as needed (allergies).      . gabapentin (NEURONTIN) 600 MG tablet TAKE 1 BY MOUTH AT BEDTIME  30 tablet  0  . ibuprofen (ADVIL,MOTRIN) 200 MG tablet Take 400 mg by mouth every 6 (six) hours as needed for moderate pain.       No facility-administered medications prior to visit.    PAST  MEDICAL HISTORY: Past Medical History  Diagnosis Date  . Hypertension   . High cholesterol   . Osteopenia 02/2013    T score -1.3 FRAX not calculated due to patient on HRT  . Vitamin D deficiency 2014    17  . MS (multiple sclerosis)     questionable  . Factor 5 Leiden mutation, heterozygous   . Heart palpitations   . Heart valve problem     mild- moderate mitral valve leakage   . Complication of anesthesia     anxiety attack after waking up from anesthesia  . Heart murmur   . Arthritis   . GERD (gastroesophageal reflux disease)     PAST SURGICAL HISTORY: Past Surgical History  Procedure Laterality Date  . Pelvic laparoscopy      LSO  . Chloecystectomy    . Appendectomy    . Cesarean section      x2  . Tubal ligation    . Knee ligament reconstruction    . Laser throat polyps    . Neck lesion biopsy    . Patella fracture surgery    . Arthroscopic knee surgery    . Total knee arthroplasty Left 2012  . Total abdominal hysterectomy w/ bilateral salpingoophorectomy  2000  . Esophageal dilation  04/09/2012  . Cholecystectomy    . Tonsillectomy    . Total knee arthroplasty Right 07/08/2012    Procedure: TOTAL KNEE ARTHROPLASTY with revision components ;  Surgeon: Hessie Dibble, MD;  Location: [facility];  Service: Orthopedics;  Laterality: Right;  PATIENT HAS HX. OF FACTOR IV DISORFER    FAMILY HISTORY: Family History  Problem  Relation Age of Onset  . Hypertension Mother   . Diabetes Mother   . Hyperlipidemia Mother   . Heart disease Father   . Hypertension Father   . Hyperlipidemia Father   . Hypertension Sister   . Colon cancer Paternal Aunt   . Breast cancer Paternal Aunt 5  . Heart disease Maternal Grandfather   . Lymphoma Maternal Grandfather   . Heart failure Maternal Grandfather   . Colon cancer Paternal Grandmother   . Hyperlipidemia Brother   . Hypertension Brother   . Hypertension Brother   . Colon cancer Paternal Aunt   . Breast cancer Paternal Aunt 18  . Stroke Paternal Grandfather   . Cancer Father   . Other Father     Respiratory problems  . Other Father     Factor 5 disorder    SOCIAL HISTORY: History   Social History  . Marital Status: Married    Spouse Name: [name]    Number of Children: 2  . Years of Education: 12   Occupational History  .  [employer]   Social History Main Topics  . Smoking status: Never Smoker   . Smokeless tobacco: Never Used  . Alcohol Use: Yes     Comment: Very rare  . Drug Use: No  . Sexual Activity: Yes    Birth Control/ Protection: Surgical     Comment: HYST   Other Topics Concern  . Not on file   Social History Narrative   [name]  and lives at home with her husband.   Patient has two children.   Patient works as a Mudlogger for [employer].   Patient drinks one cup of caffeine daily.   Patient is right-handed.   Patient has a high school education.     PHYSICAL EXAM  Filed Vitals:   01/20/14 0838  BP: 114/76  Pulse: 74  Height: 5\' 3"  (1.6 m)  Weight: 226 lb (102.513 kg)   Body mass index is 40.04 kg/(m^2). General: The patient is awake, alert and appears not in acute distress. The patient is well groomed. She is obese Head: Normocephalic, atraumatic.  Neck is supple.  Cardiovascular: Regular rate and rhythm ,  Skin: Without evidence of edema, or rash  Trunk: BMI 40.04  Neurologic  exam :  The patient is awake and alert, oriented to place and time. Memory subjective described as intact. ESS 13. FSS 26 Cranial nerves:  Pupils are equal and briskly reactive to light. Funduscopic exam without evidence of pallor or edema.  Extraocular movements in vertical and horizontal planes intact and without nystagmus. Visual fields by finger perimetry are intact.  Hearing to finger rub intact. Facial sensation intact to fine touch. Facial motor strength is symmetric and tongue and uvula move midline.  Motor exam: Normal tone, muscle bulk and symmetric normal strength in all extremities. There is a mild foot tightness, limited range of strength to left right foot at the ankle.  Sensory: Fine touch, pinprick and vibration were tested in all extremities. Proprioception is normal.  Coordination: Rapid alternating movements in the fingers/hands is tested and normal.  Finger-to-nose maneuver tested and normal without evidence of ataxia, dysmetria or tremor.  Gait and station: Patient walks without assistive device . Can heel and toe walk and tandem without difficulty. Deep tendon reflexes:1+ in the upper and lower extremities Babinski maneuver response is down going on the left and equivocal on the right .     DIAGNOSTIC DATA (LABS, IMAGING, TESTING) - I reviewed patient records, labs, notes, testing and imaging myself where available.  Lab Results  Component Value Date   WBC 7.3 06/01/2013   HGB 12.2 06/01/2013   HCT 36.8 06/01/2013   MCV 88.9 06/01/2013   PLT 253 06/01/2013      Component Value Date/Time   NA 139 06/01/2013 0925   K 4.3 06/01/2013 0925   CL 101 06/01/2013 0925   CO2 25 06/01/2013 0925   GLUCOSE 91 06/01/2013 0925   BUN 10 06/01/2013 0925   CREATININE 0.68 06/01/2013 0925   CALCIUM 8.9 06/01/2013 0925   PROT 7.6 06/01/2013 0925   ALBUMIN 3.8 06/01/2013 0925   AST 21 06/01/2013 0925   ALT 19 06/01/2013 0925   ALKPHOS 82 06/01/2013 0925   BILITOT 0.3 06/01/2013 0925   GFRNONAA >90 06/01/2013 0925    GFRAA >90 06/01/2013 0925    ASSESSMENT AND PLAN  48 y.o. year old female  has a past medical history of headaches which are controlled with gabapentin, and her sleep study shows mild obstructive sleep apnea. She is currently on CPAP 7 cm of pressure  Good compliance with CPAP, no adjustment necessary per Dr. Brett Fairy Continue Gabapentin at hs F/U yearly with me F/U 6 months with Dr. Brett Fairy Dennie Bible, [facility], [facility], [facility] [address] [place], [place] [address] [phone]

## 2014-01-20 NOTE — Progress Notes (Signed)
I agree with the assessment and plan as directed by NP .The patient is known to me .   Jereld Presti, MD  

## 2014-02-16 ENCOUNTER — Encounter: Payer: Self-pay | Admitting: Neurology

## 2014-02-27 ENCOUNTER — Encounter: Payer: Self-pay | Admitting: Nurse Practitioner

## 2014-04-11 ENCOUNTER — Encounter: Payer: BC Managed Care – PPO | Admitting: Gynecology

## 2014-05-02 ENCOUNTER — Telehealth: Payer: Self-pay | Admitting: *Deleted

## 2014-05-02 MED ORDER — ESTRADIOL 1 MG PO TABS: 1.5000 mg | ORAL_TABLET | Freq: Every day | ORAL | Status: DC

## 2014-05-02 NOTE — Telephone Encounter (Signed)
Pt has annual scheduled on 05/31/14 requesting refill on estradiol 1 mg, Rx will be sent

## 2014-05-31 ENCOUNTER — Ambulatory Visit (INDEPENDENT_AMBULATORY_CARE_PROVIDER_SITE_OTHER): Payer: 59 | Admitting: Gynecology

## 2014-05-31 ENCOUNTER — Encounter: Payer: Self-pay | Admitting: Gynecology

## 2014-05-31 VITALS — BP 124/82 | Ht 62.0 in | Wt 228.0 lb

## 2014-05-31 DIAGNOSIS — N951 Menopausal and female climacteric states: Secondary | ICD-10-CM

## 2014-05-31 DIAGNOSIS — M8580 Other specified disorders of bone density and structure, unspecified site: Secondary | ICD-10-CM

## 2014-05-31 DIAGNOSIS — Z79890 Hormone replacement therapy (postmenopausal): Secondary | ICD-10-CM

## 2014-05-31 DIAGNOSIS — Z01419 Encounter for gynecological examination (general) (routine) without abnormal findings: Secondary | ICD-10-CM

## 2014-05-31 MED ORDER — ESTRADIOL 1 MG PO TABS: 1.5000 mg | ORAL_TABLET | Freq: Every day | ORAL | Status: DC

## 2014-05-31 NOTE — Patient Instructions (Signed)
Tried taking one estrogen pill daily and see how you do with that. Call me if you have any issues.  You may obtain a copy of any labs that were done today by logging onto MyChart as outlined in the instructions provided with your AVS (after visit summary). The office will not call with normal lab results but certainly if there are any significant abnormalities then we will contact you.   Health Maintenance, Female A healthy lifestyle and preventative care can promote health and wellness.  Maintain regular health, dental, and eye exams.  Eat a healthy diet. Foods like vegetables, fruits, whole grains, low-fat dairy products, and lean protein foods contain the nutrients you need without too many calories. Decrease your intake of foods high in solid fats, added sugars, and salt. Get information about a proper diet from your caregiver, if necessary.  Regular physical exercise is one of the most important things you can do for your health. Most adults should get at least 150 minutes of moderate-intensity exercise (any activity that increases your heart rate and causes you to sweat) each week. In addition, most adults need muscle-strengthening exercises on 2 or more days a week.   Maintain a healthy weight. The body mass index (BMI) is a screening tool to identify possible weight problems. It provides an estimate of body fat based on height and weight. Your caregiver can help determine your BMI, and can help you achieve or maintain a healthy weight. For adults 20 years and older:  A BMI below 18.5 is considered underweight.  A BMI of 18.5 to 24.9 is normal.  A BMI of 25 to 29.9 is considered overweight.  A BMI of 30 and above is considered obese.  Maintain normal blood lipids and cholesterol by exercising and minimizing your intake of saturated fat. Eat a balanced diet with plenty of fruits and vegetables. Blood tests for lipids and cholesterol should begin at age 89 and be repeated every 5 years.  If your lipid or cholesterol levels are high, you are over 50, or you are a high risk for heart disease, you may need your cholesterol levels checked more frequently.Ongoing high lipid and cholesterol levels should be treated with medicines if diet and exercise are not effective.  If you smoke, find out from your caregiver how to quit. If you do not use tobacco, do not start.  Lung cancer screening is recommended for adults aged 33 80 years who are at high risk for developing lung cancer because of a history of smoking. Yearly low-dose computed tomography (CT) is recommended for people who have at least a 30-pack-year history of smoking and are a current smoker or have quit within the past 15 years. A pack year of smoking is smoking an average of 1 pack of cigarettes a day for 1 year (for example: 1 pack a day for 30 years or 2 packs a day for 15 years). Yearly screening should continue until the smoker has stopped smoking for at least 15 years. Yearly screening should also be stopped for people who develop a health problem that would prevent them from having lung cancer treatment.  If you are pregnant, do not drink alcohol. If you are breastfeeding, be very cautious about drinking alcohol. If you are not pregnant and choose to drink alcohol, do not exceed 1 drink per day. One drink is considered to be 12 ounces (355 mL) of beer, 5 ounces (148 mL) of wine, or 1.5 ounces (44 mL) of liquor.  Avoid  use of street drugs. Do not share needles with anyone. Ask for help if you need support or instructions about stopping the use of drugs.  High blood pressure causes heart disease and increases the risk of stroke. Blood pressure should be checked at least every 1 to 2 years. Ongoing high blood pressure should be treated with medicines, if weight loss and exercise are not effective.  If you are 57 to 48 years old, ask your caregiver if you should take aspirin to prevent strokes.  Diabetes screening involves  taking a blood sample to check your fasting blood sugar level. This should be done once every 3 years, after age 60, if you are within normal weight and without risk factors for diabetes. Testing should be considered at a younger age or be carried out more frequently if you are overweight and have at least 1 risk factor for diabetes.  Breast cancer screening is essential preventative care for women. You should practice "breast self-awareness." This means understanding the normal appearance and feel of your breasts and may include breast self-examination. Any changes detected, no matter how small, should be reported to a caregiver. Women in their 22s and 30s should have a clinical breast exam (CBE) by a caregiver as part of a regular health exam every 1 to 3 years. After age 49, women should have a CBE every year. Starting at age 20, women should consider having a mammogram (breast X-ray) every year. Women who have a family history of breast cancer should talk to their caregiver about genetic screening. Women at a high risk of breast cancer should talk to their caregiver about having an MRI and a mammogram every year.  Breast cancer gene (BRCA)-related cancer risk assessment is recommended for women who have family members with BRCA-related cancers. BRCA-related cancers include breast, ovarian, tubal, and peritoneal cancers. Having family members with these cancers may be associated with an increased risk for harmful changes (mutations) in the breast cancer genes BRCA1 and BRCA2. Results of the assessment will determine the need for genetic counseling and BRCA1 and BRCA2 testing.  The Pap test is a screening test for cervical cancer. Women should have a Pap test starting at age 82. Between ages 50 and 43, Pap tests should be repeated every 2 years. Beginning at age 18, you should have a Pap test every 3 years as long as the past 3 Pap tests have been normal. If you had a hysterectomy for a problem that was not  cancer or a condition that could lead to cancer, then you no longer need Pap tests. If you are between ages 45 and 40, and you have had normal Pap tests going back 10 years, you no longer need Pap tests. If you have had past treatment for cervical cancer or a condition that could lead to cancer, you need Pap tests and screening for cancer for at least 20 years after your treatment. If Pap tests have been discontinued, risk factors (such as a new sexual partner) need to be reassessed to determine if screening should be resumed. Some women have medical problems that increase the chance of getting cervical cancer. In these cases, your caregiver may recommend more frequent screening and Pap tests.  The human papillomavirus (HPV) test is an additional test that may be used for cervical cancer screening. The HPV test looks for the virus that can cause the cell changes on the cervix. The cells collected during the Pap test can be tested for HPV. The HPV  test could be used to screen women aged 75 years and older, and should be used in women of any age who have unclear Pap test results. After the age of 31, women should have HPV testing at the same frequency as a Pap test.  Colorectal cancer can be detected and often prevented. Most routine colorectal cancer screening begins at the age of 36 and continues through age 75. However, your caregiver may recommend screening at an earlier age if you have risk factors for colon cancer. On a yearly basis, your caregiver may provide home test kits to check for hidden blood in the stool. Use of a small camera at the end of a tube, to directly examine the colon (sigmoidoscopy or colonoscopy), can detect the earliest forms of colorectal cancer. Talk to your caregiver about this at age 23, when routine screening begins. Direct examination of the colon should be repeated every 5 to 10 years through age 61, unless early forms of pre-cancerous polyps or small growths are  found.  Hepatitis C blood testing is recommended for all people born from [birth year] through 1964 and any individual with known risks for hepatitis C.  Practice safe sex. Use condoms and avoid high-risk sexual practices to reduce the spread of sexually transmitted infections (STIs). Sexually active women aged 33 and younger should be checked for Chlamydia, which is a common sexually transmitted infection. Older women with new or multiple partners should also be tested for Chlamydia. Testing for other STIs is recommended if you are sexually active and at increased risk.  Osteoporosis is a disease in which the bones lose minerals and strength with aging. This can result in serious bone fractures. The risk of osteoporosis can be identified using a bone density scan. Women ages 4 and over and women at risk for fractures or osteoporosis should discuss screening with their caregivers. Ask your caregiver whether you should be taking a calcium supplement or vitamin D to reduce the rate of osteoporosis.  Menopause can be associated with physical symptoms and risks. Hormone replacement therapy is available to decrease symptoms and risks. You should talk to your caregiver about whether hormone replacement therapy is right for you.  Use sunscreen. Apply sunscreen liberally and repeatedly throughout the day. You should seek shade when your shadow is shorter than you. Protect yourself by wearing long sleeves, pants, a wide-brimmed hat, and sunglasses year round, whenever you are outdoors.  Notify your caregiver of new moles or changes in moles, especially if there is a change in shape or color. Also notify your caregiver if a mole is larger than the size of a pencil eraser.  Stay current with your immunizations. Document Released: 10/28/2010 Document Revised: 08/09/2012 Document Reviewed: 10/28/2010 [facility] Patient Information 2014 [place].

## 2014-05-31 NOTE — Progress Notes (Signed)
Renee Flores 1965/12/18 [id number]        49 y.o.  G2P2002 for annual exam.  Several issues noted below.  Past medical history,surgical history, problem list, medications, allergies, family history and social history were all reviewed and documented as reviewed in the EPIC chart.  ROS:  Performed with pertinent positives and negatives included in the history, assessment and plan.   Additional significant findings :  none   Exam: Kim Counsellor Vitals:   05/31/14 1510  BP: 124/82  Height: 5\' 2"  (1.575 m)  Weight: 228 lb (103.42 kg)   General appearance:  Normal affect, orientation and appearance. Skin: Grossly normal HEENT: Without gross lesions.  No cervical or supraclavicular adenopathy. Thyroid normal.  Lungs:  Clear without wheezing, rales or rhonchi Cardiac: RR, without RMG Abdominal:  Soft, nontender, without masses, guarding, rebound, organomegaly or hernia Breasts:  Examined lying and sitting without masses, retractions, discharge or axillary adenopathy. Pelvic:  Ext/BUS/vagina normal  Adnexa  Without masses or tenderness    Anus and perineum  Normal   Rectovaginal  Normal sphincter tone without palpated masses or tenderness.    Assessment/Plan:  49 y.o. G17P2002 female for annual exam.   1. History of TAH/RSO with subsequent laparoscopic LSO on ERT of estradiol 1.5 mg daily. Has tried to wean in the past with unacceptable hot flushes and night sweats. Had tried the estrogen patch which would not stick. History of Leiden factor V heterologous mutation discovered after her brother had a thrombosis and he was tested and found positive and they subsequently tested the family. She has no personal history of clots. I again reviewed the risks of ERT and thrombosis including stroke heart attack DVT and pulmonary embolus. Increased risk with Leiden factor V mutation. Alternatives to include nonhormonal medications to see if they help with hot flashes. Possible increased risk  of breast cancer also with HRT. After a lengthy discussion the patient wants to continue on HRT. She clearly understands the risks. I did ask her to try to wean to 1 mg daily and see how she does with this. Refill 1 year provided. 2. Osteopenia. DEXA 02/2013 T score -1.3. No fractures due to her ERT. Stable from prior studies. Being followed for low vitamin D by her primary physician and just finished a course of 50,000 units weekly. We'll continue to follow up with them for monitoring. Recommend repeat DEXA in another 1-2 years. 3. Pap smear 03/2013. No Pap smear done today. No history of significant abnormal Pap smears. Options to stop screening as she is status post hysterectomy for benign indications reviewed. Will readdress on anal basis. 4. Mammography 11/2013. Continue with annual mammography. SBE monthly reviewed. 5. Health maintenance. No routine blood work done as she reports this done at her primary physician's office. Follow up in one year, sooner as needed.     Anastasio Auerbach MD, 3:54 PM 05/31/2014

## 2014-07-13 ENCOUNTER — Other Ambulatory Visit: Payer: Self-pay

## 2014-07-13 MED ORDER — METOPROLOL SUCCINATE ER 25 MG PO TB24: 25.0000 mg | ORAL_TABLET | Freq: Every day | ORAL | Status: DC

## 2014-07-24 ENCOUNTER — Encounter: Payer: Self-pay | Admitting: Neurology

## 2014-07-25 ENCOUNTER — Encounter: Payer: Self-pay | Admitting: Neurology

## 2014-07-25 ENCOUNTER — Ambulatory Visit (INDEPENDENT_AMBULATORY_CARE_PROVIDER_SITE_OTHER): Payer: 59 | Admitting: Neurology

## 2014-07-25 VITALS — BP 145/82 | HR 76 | Resp 12 | Ht 63.0 in | Wt 228.4 lb

## 2014-07-25 DIAGNOSIS — D509 Iron deficiency anemia, unspecified: Secondary | ICD-10-CM | POA: Diagnosis not present

## 2014-07-25 DIAGNOSIS — J301 Allergic rhinitis due to pollen: Secondary | ICD-10-CM

## 2014-07-25 DIAGNOSIS — G4733 Obstructive sleep apnea (adult) (pediatric): Secondary | ICD-10-CM | POA: Diagnosis not present

## 2014-07-25 DIAGNOSIS — G4730 Sleep apnea, unspecified: Secondary | ICD-10-CM | POA: Diagnosis not present

## 2014-07-25 DIAGNOSIS — J0121 Acute recurrent ethmoidal sinusitis: Secondary | ICD-10-CM

## 2014-07-25 DIAGNOSIS — G2581 Restless legs syndrome: Secondary | ICD-10-CM

## 2014-07-25 DIAGNOSIS — Z9989 Dependence on other enabling machines and devices: Principal | ICD-10-CM

## 2014-07-25 DIAGNOSIS — G4710 Hypersomnia, unspecified: Secondary | ICD-10-CM | POA: Diagnosis not present

## 2014-07-25 MED ORDER — TRIAMCINOLONE ACETONIDE 55 MCG/ACT NA AERO: 2.0000 | INHALATION_SPRAY | Freq: Every day | NASAL | Status: DC

## 2014-07-25 NOTE — Progress Notes (Signed)
[name]  PATIENT: Renee Flores DOB: [birth year]   REASON FOR VISIT: Followup for headaches and hypersomnia   HISTORY OF PRESENT ILLNESS: Renee Flores has been seen yearly in followup with for a history of headaches, cervical tenderness and right-sided weakness of unknown etiology. She had been followed at [facility] since July 2001, [name] .  The patient had been evaluated for mini strokes and multiple sclerosis. An MRI study was normal . Nerve conduction and EMGs  studies did not show peripheral neuropathy, a lumbar puncture and evaluation of cerebral spinal fluid did not show oligoclonal banding and was negative for Lyme disease as well. [name] At [facility] saw her for a second opinion but did not feel that she had multiple sclerosis. She was initiated on gabapentin in 2001 and repeat MRIs (the latest of March 2005 ) were Normal.  She was followed [name] and then CM- For joint pain, difficult sleep it sleeping due to anxiety and depression but her daytime sleepiness became an impairment for her daily functioning and productivity. The headaches are now in good control: [name], [facility] opted for a sleep study as indicated to further evaluate the reason for the excessive daytime hypersomnia.  Patient goes to bed between 10 PM and 10.30, but it may take an hour to reach sustained sleep, waking frequently.  Dreams; yes , but not recalling dreams, some dream intrusion, no sleep paralysis, no cataplexy.  The patient is to rise at 4 AM to her nocturnal sleep time is less than 6 hours. She will take afternoon- Naps during the week in frequently, but she does now on weekends. She says she has a hard time waking up in the morning on arising off but she feels not refreshed and would like to take another 20 or 30 minutes of sleep time after a lumbar water. He relies on alarm. She endorsed today the Epworth sleepiness score at 18 fatigue at 53 points.   The study was performed  on 07-14-13. The patient's Epworth sleepiness score at 19 points the patient health questionnaire at 18 points indicating also a moderate severe depression.  BMI was 39.0, neck circumference measured 14.5 inches.  The patient's AHI was 6.7 and the RDI 7.0 during REM sleep -however AHI was 36 and in supine sleep her AHI was 30.4 per hour of sleep.  Oxygen nadir was 74% for total time of 17.8 minutes. Heart rate was regular and normal sinus rhythm. There were not many PLMs. The study revealed such mild obstructive sleep apnea but I discussed it with CPAP should even be performed. Positive pressure therapy is an option given that the patient has excessive daytime sleepiness and I ordered for her to undergo CPAP titration f then see [place] for a revisit in 30 days of use.  On-05-30-46 the patient underwent CPAP titration with reduced her AHI to 0.3 and the oxygen nadir rose to 91%. She was titrated to 7 cm CPAP at night and slept 183 minutes of which 32 minutes were in REM sleep. She had some restless legs now, leg cramping at home.  This visit was scheduled for today. The patient only received confirmation that her CPAP will be delivered Thursday the 16th.  I had ordered an CPAP through [facility] - After 30 days of CPAP use we wanted to re-evaluate the degree of sleepiness and follow with an MSLT as needed.   The patient remains sleepier than average with a Epworth score of 14  points on 3-20 9-16. Her compliance was lower because of allergic rhinitis and she is exquisitely reports today. The patient has mild retrognathia, she has an elevated body mass index however her CPAP works when she can tolerate using it. 2 make it able for her to use her CPAP I will prescribe her a nasal spray this can also be  obtained over-the-counter.  RLS bother her every night, she had a knee replacement in Feb 2016.     REVIEW OF SYSTEMS: Full 14 system review of systems performed and notable only for those listed, all  others are neg:  Musculoskeletal: Joint pain, neck stiffness, obesity,  No snoring on CPAP.  Epworth score  14   FSS   25 , Depression by Phq 9. At  2 points.  Neurological: Weakness Sleep : Restless legs, rhinitis related intolerance ot nasal CPAP, obesity unchanged, hoarseness.   ALLERGIES: Allergies  Allergen Reactions  . Erythromycin Base Hives  . Morphine Other (See Comments)    Hallucinating   . Vancomycin Hcl Other (See Comments)    Ringing in ears  . Doxycycline Nausea Only and Rash    HOME MEDICATIONS: Outpatient Prescriptions Prior to Visit  Medication Sig Dispense Refill  . Calcium Carb-Cholecalciferol (CALCIUM + D3) 600-200 MG-UNIT TABS Take 1 tablet by mouth daily.    . cloNIDine (CATAPRES) 0.1 MG tablet Take 0.1 mg by mouth 3 (three) times daily.     Marland Kitchen esomeprazole (NEXIUM) 40 MG capsule Take 40 mg by mouth daily before breakfast.    . estradiol (ESTRACE) 1 MG tablet Take 1.5 tablets (1.5 mg total) by mouth daily. 135 tablet 4  . fluticasone (FLONASE) 50 MCG/ACT nasal spray Place 2 sprays into the nose daily as needed for allergies.     Marland Kitchen gabapentin (NEURONTIN) 600 MG tablet Take 1 tablet (600 mg total) by mouth at bedtime. 90 tablet 3  . ketotifen (ALAWAY) 0.025 % ophthalmic solution Place 2 drops into both eyes daily as needed (for irritation).     Marland Kitchen losartan (COZAAR) 50 MG tablet Take 50 mg by mouth 2 (two) times daily.     . meloxicam (MOBIC) 15 MG tablet Take 15 mg by mouth daily.    . metoprolol succinate (TOPROL XL) 25 MG 24 hr tablet Take 1 tablet (25 mg total) by mouth daily. 90 tablet 0  . montelukast (SINGULAIR) 10 MG tablet Take 10 mg by mouth at bedtime.    . Potassium Gluconate 550 MG TABS Take 1 tablet by mouth 2 (two) times daily.    . simvastatin (ZOCOR) 40 MG tablet Take 40 mg by mouth at bedtime.      . hydrochlorothiazide (HYDRODIURIL) 25 MG tablet Take 1 tablet (25 mg total) by mouth daily. (Patient not taking: Reported on 07/25/2014) 90 tablet 3     No facility-administered medications prior to visit.    PAST MEDICAL HISTORY: Past Medical History  Diagnosis Date  . Hypertension   . High cholesterol   . Osteopenia 02/2013    T score -1.3 FRAX not calculated due to patient on HRT  . Vitamin D deficiency 2014    17  . MS (multiple sclerosis)     questionable  . Factor 5 Leiden mutation, heterozygous   . Heart palpitations   . Heart valve problem     mild- moderate mitral valve leakage   . Complication of anesthesia     anxiety attack after waking up from anesthesia  . Heart murmur   .  Arthritis   . GERD (gastroesophageal reflux disease)     PAST SURGICAL HISTORY: Past Surgical History  Procedure Laterality Date  . Pelvic laparoscopy      LSO  . Chloecystectomy    . Appendectomy    . Cesarean section      x2  . Tubal ligation    . Knee ligament reconstruction    . Laser throat polyps    . Neck lesion biopsy    . Patella fracture surgery    . Arthroscopic knee surgery    . Total knee arthroplasty Left 2012  . Total abdominal hysterectomy w/ bilateral salpingoophorectomy  2000  . Esophageal dilation  04/09/2012  . Cholecystectomy    . Tonsillectomy    . Total knee arthroplasty Right 07/08/2012    Procedure: TOTAL KNEE ARTHROPLASTY with revision components ;  Surgeon: Hessie Dibble, MD;  Location: [facility];  Service: Orthopedics;  Laterality: Right;  PATIENT HAS HX. OF FACTOR IV DISORFER  . Abdominal hysterectomy      FAMILY HISTORY: Family History  Problem Relation Age of Onset  . Hypertension Mother   . Diabetes Mother   . Hyperlipidemia Mother   . Heart disease Father   . Hypertension Father   . Hyperlipidemia Father   . Hypertension Sister   . Colon cancer Paternal Aunt   . Breast cancer Paternal Aunt 83  . Heart disease Maternal Grandfather   . Lymphoma Maternal Grandfather   . Heart failure Maternal Grandfather   . Colon cancer Paternal Grandmother   . Hyperlipidemia Brother   . Hypertension  Brother   . Hypertension Brother   . Colon cancer Paternal Aunt   . Breast cancer Paternal Aunt 87  . Stroke Paternal Grandfather   . Cancer Father   . Other Father     Respiratory problems/Factor 5 disorder  . Other Father     1 bypass, 2 valve replacement (mitral and aortic)     SOCIAL HISTORY: History   Social History  . Marital Status: Married    Spouse Name: Jenny Reichmann  . Number of Children: 2  . Years of Education: 12   Occupational History  .  [employer]   Social History Main Topics  . Smoking status: Never Smoker   . Smokeless tobacco: Never Used  . Alcohol Use: 0.0 oz/week    0 Standard drinks or equivalent per week     Comment: Very rare  . Drug Use: No  . Sexual Activity: Yes    Birth Control/ Protection: Surgical     Comment: HYST-1st intercourse 49 yo-Fewer than 5 partners   Other Topics Concern  . Not on file   Social History Narrative   [name]  and lives at home with her husband.   Patient has two children.   Patient works as a Mudlogger for [employer].   Patient drinks one cup of caffeine daily.   Patient is right-handed.   Patient has a high school education.     PHYSICAL EXAM  Filed Vitals:   07/25/14 1643  BP: 145/82  Pulse: 76  Resp: 12  Height: 5\' 3"  (1.6 m)  Weight: 228 lb 6.4 oz (103.602 kg)   Body mass index is 40.47 kg/(m^2). General: The patient is awake, alert and appears not in acute distress. The patient is well groomed. She is obese, neck 15 inches, retrognathia, mallompati 4 . Hoarseness.  Head: Normocephalic, atraumatic.  Neck is supple.  Cardiovascular: Regular  rate and rhythm ,  Skin: Without evidence of edema, or rash  Trunk: BMI 40.04  Neurologic exam :  The patient is awake and alert, oriented to place and time. Memory subjective described as intact.   Cranial nerves:  Pupils are equal and briskly reactive to light. Funduscopic exam without evidence of pallor or edema. "Lazy eye  "on the left. Using biifocals.  Extraocular movements in vertical and horizontal planes intact , endpoint  Nystagmus.  Visual fields by finger perimetry are intact.  Hearing to finger rub intact. Facial sensation intact to fine touch. Facial motor strength is symmetric and tongue / uvula move midline.  Reddened  Mucosa.  Motor exam: Normal tone, muscle bulk and symmetric normal strength in all extremities. There is a mild foot tightness, limited range of strength to left right foot at the ankle.  Sensory: Fine touch, pinprick and vibration were tested in all extremities.  Coordination: Rapid alternating movements in the fingers/hands is tested and normal.  Finger-to-nose maneuver tested and normal without evidence of ataxia, dysmetria or tremor.  Gait and station: Patient walks without assistive device. Can heel and toe walk and tandem without difficulty. Crepitation over hip and knee. Left sided.  Deep tendon reflexes:1+ in the upper and lower extremities Babinski maneuver response is downgoing on the left , equivocal on the right .     DIAGNOSTIC DATA (LABS, IMAGING, TESTING) - I reviewed patient records, labs, notes, testing and imaging myself where available.  Lab Results  Component Value Date   WBC 7.3 06/01/2013   HGB 12.2 06/01/2013   HCT 36.8 06/01/2013   MCV 88.9 06/01/2013   PLT 253 06/01/2013      Component Value Date/Time   NA 139 06/01/2013 0925   K 4.3 06/01/2013 0925   CL 101 06/01/2013 0925   CO2 25 06/01/2013 0925   GLUCOSE 91 06/01/2013 0925   BUN 10 06/01/2013 0925   CREATININE 0.68 06/01/2013 0925   CALCIUM 8.9 06/01/2013 0925   PROT 7.6 06/01/2013 0925   ALBUMIN 3.8 06/01/2013 0925   AST 21 06/01/2013 0925   ALT 19 06/01/2013 0925   ALKPHOS 82 06/01/2013 0925   BILITOT 0.3 06/01/2013 0925   GFRNONAA >90 06/01/2013 0925   GFRAA >90 06/01/2013 0925    ASSESSMENT AND PLAN  50 y.o. year old female  has a past medical history of headaches which are  controlled with gabapentin, and her sleep study shows mild obstructive sleep apnea.  She is currently on CPAP 7 cm of pressure  Seasonally poor compliance with CPAP, no adjustment necessary per Dr. Brett Fairy- I will prescribe the patient nasal next or Nasacort to help her through the allergic rhinitis. This seems to have reduced her compliance for spring of this year 2016. She still has used the machine 27 out of 30 days dated 3-20 6-16 with a residual AHI of 1.0. But she has felt more fatigued, more sleepy and especially more "nasally congested. I explained that the patient is allowed to not use his CPAP for 2 or 3 days if she needs to reduce sinusitis bronchitis or rhinitis. She also has a very hoarse voice today. Continue Gabapentin at hs for restless leg and leg pain. She is continuing using some home remedies.    F/U 12 months with Dennie Bible, [place], [facility], APRN, please note that restless leg patients will need an iron and total iron binding capacity study once a year, and that all obstructive sleep apnea patients will have  a fatigue severity scale and Epworth score notation in the chart. We need to measure neck circumference, and on retrognathia and Mallampati.  [facility] [address] [place], [place] [address] [phone]

## 2014-08-02 ENCOUNTER — Ambulatory Visit: Payer: BC Managed Care – PPO | Admitting: Neurology

## 2014-09-08 ENCOUNTER — Other Ambulatory Visit: Payer: Self-pay

## 2014-09-08 MED ORDER — HYDROCHLOROTHIAZIDE 25 MG PO TABS: 25.0000 mg | ORAL_TABLET | Freq: Every day | ORAL | Status: DC

## 2014-09-15 ENCOUNTER — Other Ambulatory Visit: Payer: Self-pay | Admitting: Cardiology

## 2014-11-01 ENCOUNTER — Other Ambulatory Visit: Payer: Self-pay | Admitting: Cardiology

## 2014-11-26 ENCOUNTER — Other Ambulatory Visit: Payer: Self-pay | Admitting: Cardiology

## 2014-11-28 ENCOUNTER — Other Ambulatory Visit: Payer: Self-pay

## 2015-02-04 ENCOUNTER — Other Ambulatory Visit: Payer: Self-pay

## 2015-02-04 MED ORDER — GABAPENTIN 600 MG PO TABS: 600.0000 mg | ORAL_TABLET | Freq: Every day | ORAL | Status: DC

## 2015-02-20 ENCOUNTER — Other Ambulatory Visit: Payer: Self-pay | Admitting: Cardiovascular Disease

## 2015-03-12 ENCOUNTER — Ambulatory Visit: Payer: 59 | Admitting: Podiatry

## 2015-03-26 ENCOUNTER — Encounter: Payer: Self-pay | Admitting: Podiatry

## 2015-03-26 ENCOUNTER — Ambulatory Visit (INDEPENDENT_AMBULATORY_CARE_PROVIDER_SITE_OTHER): Payer: 59 | Admitting: Podiatry

## 2015-03-26 ENCOUNTER — Ambulatory Visit (INDEPENDENT_AMBULATORY_CARE_PROVIDER_SITE_OTHER): Payer: 59

## 2015-03-26 VITALS — BP 140/93 | HR 83 | Resp 16 | Ht 63.0 in | Wt 225.0 lb

## 2015-03-26 DIAGNOSIS — M722 Plantar fascial fibromatosis: Secondary | ICD-10-CM | POA: Diagnosis not present

## 2015-03-26 DIAGNOSIS — M79673 Pain in unspecified foot: Secondary | ICD-10-CM

## 2015-03-26 MED ORDER — TRIAMCINOLONE ACETONIDE 10 MG/ML IJ SUSP
10.0000 mg | Freq: Once | INTRAMUSCULAR | Status: AC
  Administered 2015-03-26: 10 mg

## 2015-03-26 MED ORDER — MELOXICAM 15 MG PO TABS: 15.0000 mg | ORAL_TABLET | Freq: Every day | ORAL | Status: DC

## 2015-03-26 NOTE — Patient Instructions (Signed)

## 2015-03-26 NOTE — Progress Notes (Signed)
   Subjective:    Patient ID: Renee Flores, female    DOB: 1965-08-19, 49 y.o.   MRN: [id number]  HPI Pt presents with bilateral foot pain, heel. Pt stated, "left hurts more"; x1 year.   Review of Systems  Constitutional: Positive for fatigue and unexpected weight change.  HENT: Positive for sore throat and tinnitus.   Respiratory: Positive for cough.   Cardiovascular: Positive for palpitations.  Hematological: Bruises/bleeds easily.  All other systems reviewed and are negative.      Objective:   Physical Exam        Assessment & Plan:

## 2015-03-27 ENCOUNTER — Other Ambulatory Visit: Payer: Self-pay | Admitting: Cardiology

## 2015-03-27 ENCOUNTER — Other Ambulatory Visit: Payer: Self-pay | Admitting: Cardiovascular Disease

## 2015-03-28 NOTE — Progress Notes (Signed)
Subjective:     Patient ID: Renee Flores, female   DOB: [birth year], 49 y.o.   MRN: [id number]  HPI patient states I been having a lot of heel pain left over right that's been present for around one year. I've modified shoe gear and tried oral anti-inflammatories ice therapy and reduced activity   Review of Systems  All other systems reviewed and are negative.      Objective:   Physical Exam  Constitutional: She is oriented to person, place, and time.  Cardiovascular: Intact distal pulses.   Musculoskeletal: Normal range of motion.  Neurological: She is oriented to person, place, and time.  Skin: Skin is warm.  Nursing note and vitals reviewed.  neurovascular status found to be intact with muscle strength adequate range of motion within normal limits with patient noted to have exquisite discomfort plantar fascial left at the insertional tendon of the calcaneus fascial insertion. The right is moderately tender but not to the same degree and I did note a mild amount of edema on the left. Patient's found to have depression of the arch bilateral and does have good digital perfusion and is well oriented 3     Assessment:     Acute plantar fasciitis left over right heel with inflammation and fluid of the medial band    Plan:     H&P and x-rays and conditions reviewed with patient. Today I injected the plantar fascia left 3 mg Kenalog 5 mg Xylocaine and applied fascial brace left and placed on oral anti-inflammatory. Discussed physical therapy shoe gear modification and reappoint to recheck in one week

## 2015-04-09 ENCOUNTER — Ambulatory Visit (INDEPENDENT_AMBULATORY_CARE_PROVIDER_SITE_OTHER): Payer: 59 | Admitting: Podiatry

## 2015-04-09 ENCOUNTER — Encounter: Payer: Self-pay | Admitting: Podiatry

## 2015-04-09 VITALS — BP 108/68 | HR 88 | Resp 16

## 2015-04-09 DIAGNOSIS — M722 Plantar fascial fibromatosis: Secondary | ICD-10-CM

## 2015-04-09 MED ORDER — TRIAMCINOLONE ACETONIDE 10 MG/ML IJ SUSP
10.0000 mg | Freq: Once | INTRAMUSCULAR | Status: AC
  Administered 2015-04-09: 10 mg

## 2015-04-10 NOTE — Progress Notes (Signed)
Subjective:     Patient ID: Renee Flores, female   DOB: Apr 20, 1966, 49 y.o.   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

## 2015-04-15 ENCOUNTER — Other Ambulatory Visit: Payer: Self-pay | Admitting: Cardiology

## 2015-05-04 ENCOUNTER — Ambulatory Visit: Payer: 59 | Admitting: *Deleted

## 2015-05-04 DIAGNOSIS — M722 Plantar fascial fibromatosis: Secondary | ICD-10-CM

## 2015-05-04 NOTE — Patient Instructions (Signed)

## 2015-05-04 NOTE — Progress Notes (Signed)
Patient ID: Renee Flores, female   DOB: 10-31-1965, 50 y.o.   MRN: [id number] Patient presents for orthotic pick up.  Verbal and written break in and wear instructions given.  Patient will follow up in 4 weeks if symptoms worsen or fail to improve.

## 2015-06-05 ENCOUNTER — Other Ambulatory Visit: Payer: Self-pay | Admitting: Gynecology

## 2015-07-25 ENCOUNTER — Ambulatory Visit: Payer: 59 | Admitting: Adult Health

## 2015-07-29 ENCOUNTER — Other Ambulatory Visit: Payer: Self-pay | Admitting: Neurology

## 2015-07-30 ENCOUNTER — Encounter: Payer: Self-pay | Admitting: Adult Health

## 2015-07-30 ENCOUNTER — Ambulatory Visit (INDEPENDENT_AMBULATORY_CARE_PROVIDER_SITE_OTHER): Payer: 59 | Admitting: Adult Health

## 2015-07-30 VITALS — BP 122/80 | HR 86 | Resp 16 | Ht 63.0 in | Wt 237.0 lb

## 2015-07-30 DIAGNOSIS — G4733 Obstructive sleep apnea (adult) (pediatric): Secondary | ICD-10-CM

## 2015-07-30 DIAGNOSIS — Z9989 Dependence on other enabling machines and devices: Principal | ICD-10-CM

## 2015-07-30 MED ORDER — GABAPENTIN 600 MG PO TABS: 600.0000 mg | ORAL_TABLET | Freq: Every day | ORAL | Status: DC

## 2015-07-30 NOTE — Progress Notes (Signed)
PATIENT: Renee Flores DOB: 1965-06-28  REASON FOR VISIT: follow up HISTORY FROM: patient  HISTORY OF PRESENT ILLNESS: Ms. Renee Flores is a 50 year old female with a history of  OSA on CPAP. She returns today for follow-up. Her download indicates that she used her machine 29 out of 30 days for compliance of 97%. On average she uses her machine greater than 4 hours 25 out of 30 days for compliance of 83%. Her average use is 6 hours and 26 minutes. Her residual AHI is 0.6 on 7 cm of water. The patient does not have a significant leak. She states that overall she is doing well. She can tell a difference in her fatigue/sleepiness when she does not use the machine. Her Epworth sleepiness score today is 10 was previously 14. Fatigue severity score is 32. She continues to use gabapentin. She states that this was initiated when she was having discomfort in the lower extremities. She had a thorough workup for multiple sclerosis.  HISTORY 07/25/14: history of headaches, cervical tenderness and right-sided weakness of unknown etiology. She had been followed at [facility] since July 2001, [name] .  The patient had been evaluated for mini strokes and multiple sclerosis. An MRI study was normal . Nerve conduction and EMGs studies did not show peripheral neuropathy, a lumbar puncture and evaluation of cerebral spinal fluid did not show oligoclonal banding and was negative for Lyme disease as well. [name] At [facility] saw her for a second opinion but did not feel that she had multiple sclerosis. She was initiated on gabapentin in 2001 and repeat MRIs (the latest of March 2005 ) were Normal.  She was followed [name] and then CM- For joint pain, difficult sleep it sleeping due to anxiety and depression but her daytime sleepiness became an impairment for her daily functioning and productivity. The headaches are now in good control: [name], [facility] opted for a sleep study as indicated to further evaluate the  reason for the excessive daytime hypersomnia.  Patient goes to bed between 10 PM and 10.30, but it may take an hour to reach sustained sleep, waking frequently.  Dreams; yes , but not recalling dreams, some dream intrusion, no sleep paralysis, no cataplexy.  The patient is to rise at 4 AM to her nocturnal sleep time is less than 6 hours. She will take afternoon- Naps during the week in frequently, but she does now on weekends. She says she has a hard time waking up in the morning on arising off but she feels not refreshed and would like to take another 20 or 30 minutes of sleep time after a lumbar water. He relies on alarm. She endorsed today the Epworth sleepiness score at 18 fatigue at 53 points.   The study was performed on 07-14-13. The patient's Epworth sleepiness score at 19 points the patient health questionnaire at 18 points indicating also a moderate severe depression.  BMI was 39.0, neck circumference measured 14.5 inches.  The patient's AHI was 6.7 and the RDI 7.0 during REM sleep -however AHI was 36 and in supine sleep her AHI was 30.4 per hour of sleep.  Oxygen nadir was 74% for total time of 17.8 minutes. Heart rate was regular and normal sinus rhythm. There were not many PLMs. The study revealed such mild obstructive sleep apnea but I discussed it with CPAP should even be performed. Positive pressure therapy is an option given that the patient has excessive daytime sleepiness and I ordered for her  to undergo CPAP titration f then see [place] for a revisit in 30 days of use.  On-05-31-11 the patient underwent CPAP titration with reduced her AHI to 0.3 and the oxygen nadir rose to 91%. She was titrated to 7 cm CPAP at night and slept 183 minutes of which 32 minutes were in REM sleep. She had some restless legs now, leg cramping at home.  This visit was scheduled for today. The patient only received confirmation that her CPAP will be delivered Thursday the 16th.  [facility] - After 30 days of CPAP use we wanted to re-evaluate the degree of sleepiness and follow with an MSLT as needed.   The patient remains sleepier than average with a Epworth score of 14 points on 3-20 9-16. Her compliance was lower because of allergic rhinitis and she is exquisitely reports today. The patient has mild retrognathia, she has an elevated body mass index however her CPAP works when she can tolerate using it. 2 make it able for her to use her CPAP I will prescribe her a nasal spray this can also be obtained over-the-counter.  RLS bother her every night, she had a knee replacement in Feb 2016.  REVIEW OF SYSTEMS: Out of a complete 14 system review of symptoms, the patient complains only of the following symptoms, and all other reviewed systems are negative.  Activity change, unexpected weight change, ringing in ears, shortness of breath, leg swelling, palpitations, murmur, blurred vision, eye itching, excessive thirst, nausea, restless leg, apnea, daytime sleepiness, snoring, joint pain, joint swelling, back pain, aching muscles, muscle cramps, walking difficulty, neck stiffness, urgency, frequency of urination, environmental allergies, memory loss, numbness, weakness, hyperactive  ALLERGIES: Allergies  Allergen Reactions  . Erythromycin Base Hives  . Morphine Other (See Comments)    Hallucinating   . Vancomycin Hcl Other (See Comments)    Ringing in ears  . Doxycycline Nausea Only and Rash    HOME MEDICATIONS: Outpatient Prescriptions Prior to Visit  Medication Sig Dispense Refill  . Calcium Carb-Cholecalciferol (CALCIUM + D3) 600-200 MG-UNIT TABS Take 1 tablet by mouth daily.    . cloNIDine (CATAPRES) 0.1 MG tablet Take 0.1 mg by mouth 3 (three) times daily.     Marland Kitchen esomeprazole (NEXIUM) 40 MG capsule Take 40 mg by mouth daily before breakfast.    . estradiol (ESTRACE) 1 MG tablet Take 1 and 1/2 tablets by  mouth daily 135 tablet 0  . fluticasone (FLONASE) 50  MCG/ACT nasal spray Place 2 sprays into the nose daily as needed for allergies.     Marland Kitchen gabapentin (NEURONTIN) 600 MG tablet Take 1 tablet by mouth at  bedtime 90 tablet 1  . hydrochlorothiazide (HYDRODIURIL) 25 MG tablet Take 1 tablet by mouth  daily 14 tablet 0  . ketotifen (ALAWAY) 0.025 % ophthalmic solution Place 2 drops into both eyes daily as needed (for irritation).     Marland Kitchen losartan (COZAAR) 50 MG tablet Take 50 mg by mouth 2 (two) times daily.     . meloxicam (MOBIC) 15 MG tablet Take 1 tablet (15 mg total) by mouth daily. 30 tablet 0  . metoprolol succinate (TOPROL-XL) 25 MG 24 hr tablet Take 1 tablet by mouth  daily 90 tablet 1  . montelukast (SINGULAIR) 10 MG tablet Take 10 mg by mouth at bedtime.    . simvastatin (ZOCOR) 40 MG tablet Take 40 mg by mouth at bedtime.      . gabapentin (NEURONTIN)  600 MG tablet Take 1 tablet (600 mg total) by mouth at bedtime. 90 tablet 1  . Potassium Gluconate 550 MG TABS Take 1 tablet by mouth 2 (two) times daily.    Marland Kitchen triamcinolone (NASACORT) 55 MCG/ACT AERO nasal inhaler Place 2 sprays into the nose daily. 1 Inhaler 12   No facility-administered medications prior to visit.    PAST MEDICAL HISTORY: Past Medical History  Diagnosis Date  . Hypertension   . High cholesterol   . Osteopenia 02/2013    T score -1.3 FRAX not calculated due to patient on HRT  . Vitamin D deficiency 2014    17  . MS (multiple sclerosis) ([place])     questionable  . Factor 5 Leiden mutation, heterozygous ([place])   . Heart palpitations   . Heart valve problem     mild- moderate mitral valve leakage   . Complication of anesthesia     anxiety attack after waking up from anesthesia  . Heart murmur   . Arthritis   . GERD (gastroesophageal reflux disease)     PAST SURGICAL HISTORY: Past Surgical History  Procedure Laterality Date  . Pelvic laparoscopy      LSO  . Chloecystectomy    . Appendectomy    . Cesarean section      x2  . Tubal ligation    . Knee ligament  reconstruction    . Laser throat polyps    . Neck lesion biopsy    . Patella fracture surgery    . Arthroscopic knee surgery    . Total knee arthroplasty Left 2012  . Total abdominal hysterectomy w/ bilateral salpingoophorectomy  2000  . Esophageal dilation  04/09/2012  . Cholecystectomy    . Tonsillectomy    . Total knee arthroplasty Right 07/08/2012    Procedure: TOTAL KNEE ARTHROPLASTY with revision components ;  Surgeon: Hessie Dibble, MD;  Location: [place];  Service: Orthopedics;  Laterality: Right;  PATIENT HAS HX. OF FACTOR IV DISORFER  . Abdominal hysterectomy      FAMILY HISTORY: Family History  Problem Relation Age of Onset  . Hypertension Mother   . Diabetes Mother   . Hyperlipidemia Mother   . Heart disease Father   . Hypertension Father   . Hyperlipidemia Father   . Hypertension Sister   . Colon cancer Paternal Aunt   . Breast cancer Paternal Aunt 63  . Heart disease Maternal Grandfather   . Lymphoma Maternal Grandfather   . Heart failure Maternal Grandfather   . Colon cancer Paternal Grandmother   . Hyperlipidemia Brother   . Hypertension Brother   . Hypertension Brother   . Colon cancer Paternal Aunt   . Breast cancer Paternal Aunt 69  . Stroke Paternal Grandfather   . Cancer Father   . Other Father     Respiratory problems/Factor 5 disorder  . Other Father     1 bypass, 2 valve replacement (mitral and aortic)     SOCIAL HISTORY: Social History   Social History  . Marital Status: Married    Spouse Name: Jenny Reichmann  . Number of Children: 2  . Years of Education: 12   Occupational History  .  [employer]   Social History Main Topics  . Smoking status: Never Smoker   . Smokeless tobacco: Never Used  . Alcohol Use: 0.0 oz/week    0 Standard drinks or equivalent per week     Comment: Very rare  . Drug Use: No  .  Sexual Activity: Yes    Birth Control/ Protection: Surgical     Comment: HYST-1st intercourse 50 yo-Fewer than 5 partners    Other Topics Concern  . Not on file   Social History Narrative   [name]  and lives at home with her husband.   Patient has two children.   Patient works as a Mudlogger for [employer].   Patient drinks one cup of caffeine daily.   Patient is right-handed.   Patient has a high school education.      PHYSICAL EXAM  Filed Vitals:   07/30/15 1035  Height: 5\' 3"  (1.6 m)  Weight: 237 lb (107.502 kg)   Body mass index is 41.99 kg/(m^2).  Generalized: Well developed, in no acute distress  Neck: Circumference 14/2 inches, Mallampati 3+  Neurological examination  Mentation: Alert oriented to time, place, history taking. Follows all commands speech and language fluent Cranial nerve II-XII: Pupils were equal round reactive to light. Extraocular movements were full, visual field were full on confrontational test. Facial sensation and strength were normal. Uvula tongue midline. Head turning and shoulder shrug  were normal and symmetric. Motor: The motor testing reveals 5 over 5 strength of all 4 extremities. Good symmetric motor tone is noted throughout.  Sensory: Sensory testing is intact to soft touch on all 4 extremities. No evidence of extinction is noted.  Coordination: Cerebellar testing reveals good finger-nose-finger and heel-to-shin bilaterally.  Gait and station: Gait is normal. Tandem gait is normal. Romberg is negative. No drift is seen.  Reflexes: Deep tendon reflexes are symmetric and normal bilaterally.   DIAGNOSTIC DATA (LABS, IMAGING, TESTING) - I reviewed patient records, labs, notes, testing and imaging myself where available.      ASSESSMENT AND PLAN 50 y.o. year old female  has a past medical history of Hypertension; High cholesterol; Osteopenia (02/2013); Vitamin D deficiency (2014); MS (multiple sclerosis) ([place]); Factor 5 Leiden mutation, heterozygous ([place]); Heart palpitations; Heart valve problem; Complication of anesthesia; Heart  murmur; Arthritis; and GERD (gastroesophageal reflux disease). here with:  1. Obstructive sleep apnea on CPAP  Overall the patient is doing well. Her CPAP download showed excellent compliance. She is encouraged to continue using the CPAP nightly. I will refill gabapentin today. Patient advised that if her symptoms worsen or she develops any new symptoms she should let us know. She will follow-up in one year with Dr.Dohmeier.     Ward Givens, MSN, NP-C 07/30/2015, 10:37 AM [facility] [address] [place], [place] [address] [phone]

## 2015-07-30 NOTE — Patient Instructions (Signed)
Continue CPAP nightly Continue Gabapentin If your symptoms worsen or you develop new symptoms please let us know.

## 2015-08-01 ENCOUNTER — Other Ambulatory Visit: Payer: Self-pay | Admitting: Podiatry

## 2015-08-01 NOTE — Telephone Encounter (Signed)
Pt needs an appt prior to future refills. 

## 2015-08-07 ENCOUNTER — Other Ambulatory Visit: Payer: Self-pay | Admitting: Gynecology

## 2016-01-04 ENCOUNTER — Other Ambulatory Visit: Payer: Self-pay | Admitting: Gynecology

## 2016-01-10 ENCOUNTER — Other Ambulatory Visit: Payer: Self-pay | Admitting: Gynecology

## 2016-01-16 ENCOUNTER — Telehealth: Payer: Self-pay | Admitting: *Deleted

## 2016-01-16 MED ORDER — ESTRADIOL 1 MG PO TABS: 1.5000 mg | ORAL_TABLET | Freq: Every day | ORAL | 0 refills | Status: DC

## 2016-01-16 NOTE — Telephone Encounter (Signed)
Pt has annual scheduled on 02/01/16, needs refill on estradiol 1 mg, Rx sent

## 2016-01-23 ENCOUNTER — Other Ambulatory Visit: Payer: Self-pay | Admitting: Orthopaedic Surgery

## 2016-01-23 DIAGNOSIS — M5416 Radiculopathy, lumbar region: Secondary | ICD-10-CM

## 2016-01-23 DIAGNOSIS — M545 Low back pain: Secondary | ICD-10-CM

## 2016-02-01 ENCOUNTER — Encounter: Payer: Self-pay | Admitting: Gynecology

## 2016-02-01 ENCOUNTER — Ambulatory Visit (INDEPENDENT_AMBULATORY_CARE_PROVIDER_SITE_OTHER): Payer: 59 | Admitting: Gynecology

## 2016-02-01 VITALS — BP 136/84 | Ht 62.0 in | Wt 239.0 lb

## 2016-02-01 DIAGNOSIS — Z79890 Hormone replacement therapy (postmenopausal): Secondary | ICD-10-CM

## 2016-02-01 DIAGNOSIS — Z01419 Encounter for gynecological examination (general) (routine) without abnormal findings: Secondary | ICD-10-CM | POA: Diagnosis not present

## 2016-02-01 DIAGNOSIS — D6851 Activated protein C resistance: Secondary | ICD-10-CM | POA: Diagnosis not present

## 2016-02-01 DIAGNOSIS — M8580 Other specified disorders of bone density and structure, unspecified site: Secondary | ICD-10-CM

## 2016-02-01 MED ORDER — ESTRADIOL 1 MG PO TABS: 1.0000 mg | ORAL_TABLET | Freq: Every day | ORAL | 4 refills | Status: DC

## 2016-02-01 NOTE — Patient Instructions (Signed)

## 2016-02-01 NOTE — Progress Notes (Signed)
    Renee Flores 28-Jul-1965 [id number]        50 y.o.  G2P2002  for annual exam.  Several issues noted below.  Past medical history,surgical history, problem list, medications, allergies, family history and social history were all reviewed and documented as reviewed in the EPIC chart.  ROS:  Performed with pertinent positives and negatives included in the history, assessment and plan.   Additional significant findings :  none   Exam: Caryn Bee assistant Vitals:   02/01/16 1423  BP: 136/84  Weight: 239 lb (108.4 kg)  Height: 5\' 2"  (1.575 m)   Body mass index is 43.71 kg/m.  General appearance:  Normal affect, orientation and appearance. Skin: Grossly normal HEENT: Without gross lesions.  No cervical or supraclavicular adenopathy. Thyroid normal.  Lungs:  Clear without wheezing, rales or rhonchi Cardiac: RR, without RMG Abdominal:  Soft, nontender, without masses, guarding, rebound, organomegaly or hernia Breasts:  Examined lying and sitting without masses, retractions, discharge or axillary adenopathy. Pelvic:  Ext, BUS, Vagina normal  Adnexa without masses or tenderness    Anus and perineum normal   Rectovaginal normal sphincter tone without palpated masses or tenderness.    Assessment/Plan:  50 y.o. G62P2002 female for annual exam.   1. status post TVH RSO with subsequent laparoscopic LSO. Currently on estradiol 1 mg daily. Had been on 1.5 but decreased the dose. Tried stopping with unacceptable hot flushes and sweats. History of Leiden 5 factor mutation. No personal history of thrombosis but discovered after workup of a family members thrombosis.I reviewed the most current 2017 NAMS guidelines on HRT. I discussed the relative contraindication of light and factor V mutation and estrogen. Possible benefits as far as symptom relief and cardiovascular/bone health if started early versus the risk of thrombosis such as stroke heart attack DVT and breast cancer issue.  Patient  currently is on a low-dose aspirin.  Had tried the patch but had difficulty with staying on and not having symptom relief. After lengthy discussion a realistic understanding of the risks of thrombosis the patient chooses to continue on this and I refilled her 1 year. 2. Osteopenia. DEXA 2014 T score -1.3. Recommended patient repeat her DEXA now when she schedules her mammogram and she agrees to do so. The issues of osteopenia with fracture risk discussed. 3. Pap smear 2014. Pap smear done today. No history of significant abnormal Pap smears. Status post hysterectomy for benign indications. Options to stop screening per current screening guidelines based on hysterectomy history reviewed. Will readdress on annual basis. 4. Mammography 11/2013. Patient knows she is overdue and will call and schedule.SBE monthly reviewed. 5. Health maintenance. No routine lab work done as this is done elsewhere. 1 year, sooner as needed.  10 minutes of my time in excess of her routine gynecologic exam was spent in direct face to face counseling and coordination of care in regards to her HRT risks versus benefit discussion and her osteopenia review.Anastasio Auerbach MD, 2:49 PM 02/01/2016

## 2016-02-01 NOTE — Addendum Note (Signed)
Addended by: Nelva Nay on: 02/01/2016 03:05 PM   Modules accepted: Orders

## 2016-02-04 LAB — PAP IG W/ RFLX HPV ASCU

## 2016-02-05 ENCOUNTER — Ambulatory Visit
Admission: RE | Admit: 2016-02-05 | Discharge: 2016-02-05 | Disposition: A | Discharge status: Home / Self Care | Payer: 59 | Source: Ambulatory Visit | Attending: Orthopaedic Surgery | Admitting: Orthopaedic Surgery

## 2016-02-05 DIAGNOSIS — M5416 Radiculopathy, lumbar region: Secondary | ICD-10-CM

## 2016-02-05 DIAGNOSIS — M545 Low back pain, unspecified: Secondary | ICD-10-CM

## 2016-04-04 ENCOUNTER — Telehealth: Payer: Self-pay | Admitting: *Deleted

## 2016-04-04 NOTE — Telephone Encounter (Signed)
Pt called asking Dexa order to be faxed to [place] [phone], has mammogram screening on 05/01/16. Order faxed.

## 2016-04-07 NOTE — Telephone Encounter (Signed)
Pt informed order has been faxed to [phone], pt will schedule.

## 2016-04-11 ENCOUNTER — Other Ambulatory Visit: Payer: Self-pay | Admitting: Cardiology

## 2016-04-11 NOTE — Telephone Encounter (Signed)
Patient has not been seen since 06/03/13 and the last time this medication was refilled per epic was 04/16/15 and if was only for a quantity of fourteen. Please advise on request. Thanks, MI

## 2016-05-12 ENCOUNTER — Encounter: Payer: Self-pay | Admitting: Gynecology

## 2016-05-12 ENCOUNTER — Telehealth: Payer: Self-pay | Admitting: Gynecology

## 2016-05-12 NOTE — Telephone Encounter (Signed)
Pt informed with the below note, will have done at PCP and have results sent to use

## 2016-05-12 NOTE — Telephone Encounter (Signed)
Tell patient her bone density continues to show some osteopenia. I would recommend having a vitamin D level checked either to our office or through her primary physician's office to make sure she is in the therapeutic range. Otherwise we will plan on repeating the bone density in 2 years.

## 2016-05-12 NOTE — Telephone Encounter (Signed)
Left message for pt to call.

## 2016-05-27 ENCOUNTER — Other Ambulatory Visit: Payer: Self-pay | Admitting: *Deleted

## 2016-05-27 DIAGNOSIS — Z1321 Encounter for screening for nutritional disorder: Secondary | ICD-10-CM

## 2016-06-06 ENCOUNTER — Telehealth: Payer: Self-pay | Admitting: Gynecology

## 2016-06-06 ENCOUNTER — Other Ambulatory Visit: Payer: Self-pay | Admitting: Gynecology

## 2016-06-06 ENCOUNTER — Other Ambulatory Visit: Payer: 59

## 2016-06-06 DIAGNOSIS — Z1321 Encounter for screening for nutritional disorder: Secondary | ICD-10-CM

## 2016-06-06 LAB — CBC WITH DIFFERENTIAL/PLATELET
Basophils Absolute: 66 cells/uL (ref 0–200)
Basophils Relative: 1 %
Eosinophils Absolute: 264 cells/uL (ref 15–500)
Eosinophils Relative: 4 %
HCT: 37.3 % (ref 35.0–45.0)
Hemoglobin: 12.1 g/dL (ref 11.7–15.5)
Lymphocytes Relative: 41 %
Lymphs Abs: 2706 cells/uL (ref 850–3900)
MCH: 28.9 pg (ref 27.0–33.0)
MCHC: 32.4 g/dL (ref 32.0–36.0)
MCV: 89 fL (ref 80.0–100.0)
MPV: 10.3 fL (ref 7.5–12.5)
Monocytes Absolute: 462 cells/uL (ref 200–950)
Monocytes Relative: 7 %
Neutro Abs: 3102 cells/uL (ref 1500–7800)
Neutrophils Relative %: 47 %
Platelets: 256 10*3/uL (ref 140–400)
RBC: 4.19 MIL/uL (ref 3.80–5.10)
RDW: 13.2 % (ref 11.0–15.0)
WBC: 6.6 10*3/uL (ref 3.8–10.8)

## 2016-06-06 NOTE — Telephone Encounter (Signed)
Patient presents to have vitamin D level checked. She brought a prescription for a number of rheumatologic blood work ordered by her other physicians to be done at a drawing station. She asked if she could have them done here. We told her we would draw the blood but then she needs to get the results off of the MyChart and provides him to her other physicians. We will not be responsible for addressing/treating any abnormalities.

## 2016-06-07 LAB — SEDIMENTATION RATE: Sed Rate: 12 mm/hr (ref 0–20)

## 2016-06-07 LAB — URIC ACID: Uric Acid, Serum: 5.3 mg/dL (ref 2.5–7.0)

## 2016-06-09 LAB — ANA: Anti Nuclear Antibody(ANA): NEGATIVE

## 2016-06-09 LAB — C-REACTIVE PROTEIN: CRP: 15.6 mg/L — ABNORMAL HIGH (ref <8.0)

## 2016-06-09 LAB — RHEUMATOID FACTOR: Rhuematoid fact SerPl-aCnc: <14 IU/mL (ref <14)

## 2016-06-10 LAB — VITAMIN D 1,25 DIHYDROXY
Vitamin D 1, 25 (OH)2 Total: 77 pg/mL — ABNORMAL HIGH (ref 18–72)
Vitamin D2 1, 25 (OH)2: 26 pg/mL
Vitamin D3 1, 25 (OH)2: 51 pg/mL

## 2016-06-11 ENCOUNTER — Telehealth: Payer: Self-pay | Admitting: *Deleted

## 2016-06-11 NOTE — Telephone Encounter (Signed)
77 is at the higher end of normal but okay. She could decrease her vitamin D intake to 400 units daily which would also be okay.

## 2016-06-11 NOTE — Telephone Encounter (Signed)
Pt was informed to have vitamin d level drawn due to osteopenia on telephone 05/12/16. Pt received result and saw it was elevated at 77, she takes vitamin d3 1,000 units. Pt asked if anything should be done different? Please advise

## 2016-06-11 NOTE — Telephone Encounter (Signed)
Pt informed with the below note. 

## 2016-07-08 ENCOUNTER — Other Ambulatory Visit: Payer: Self-pay | Admitting: Adult Health

## 2016-07-28 ENCOUNTER — Ambulatory Visit: Payer: 59 | Admitting: Neurology

## 2016-08-11 ENCOUNTER — Ambulatory Visit: Payer: 59 | Admitting: Neurology

## 2016-08-12 ENCOUNTER — Telehealth: Payer: Self-pay

## 2016-08-12 NOTE — Telephone Encounter (Signed)
LM for patient to call back and r/s with Dr. Brett Fairy or Jinny Blossom NP

## 2016-08-18 ENCOUNTER — Telehealth: Payer: Self-pay | Admitting: Neurology

## 2016-08-18 MED ORDER — GABAPENTIN 600 MG PO TABS: 600.0000 mg | ORAL_TABLET | Freq: Every day | ORAL | 0 refills | Status: DC
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
## 2016-08-18 NOTE — Telephone Encounter (Signed)
Rx sent in, patient has pending appt

## 2016-08-18 NOTE — Addendum Note (Signed)
Addended by: Laurence Spates on: 08/18/2016 11:20 AM   Modules accepted: Orders

## 2016-11-26 ENCOUNTER — Other Ambulatory Visit: Payer: Self-pay | Admitting: Neurology

## 2016-12-01 ENCOUNTER — Ambulatory Visit: Payer: 59 | Admitting: Neurology

## 2017-03-02 ENCOUNTER — Telehealth: Payer: Self-pay | Admitting: Neurology

## 2017-03-02 ENCOUNTER — Other Ambulatory Visit: Payer: Self-pay | Admitting: Neurology

## 2017-03-02 MED ORDER — GABAPENTIN 600 MG PO TABS: 600.0000 mg | ORAL_TABLET | Freq: Every day | ORAL | 0 refills | Status: DC

## 2017-03-02 NOTE — Telephone Encounter (Signed)
Pt request refill for gabapentin (NEURONTIN) 600 MG tablet sent to [facility], [place]. Pt has appt scheduled for 11/28.

## 2017-03-02 NOTE — Telephone Encounter (Signed)
Order sent.

## 2017-03-24 ENCOUNTER — Ambulatory Visit: Payer: 59 | Admitting: Neurology

## 2017-03-25 ENCOUNTER — Encounter: Payer: Self-pay | Admitting: Neurology

## 2017-03-25 ENCOUNTER — Ambulatory Visit: Payer: Self-pay | Admitting: Neurology

## 2017-03-25 VITALS — BP 139/84 | HR 72 | Ht 62.0 in | Wt 247.0 lb

## 2017-03-25 DIAGNOSIS — G4719 Other hypersomnia: Secondary | ICD-10-CM

## 2017-03-25 DIAGNOSIS — Z9989 Dependence on other enabling machines and devices: Secondary | ICD-10-CM

## 2017-03-25 DIAGNOSIS — G4733 Obstructive sleep apnea (adult) (pediatric): Secondary | ICD-10-CM

## 2017-03-25 HISTORY — DX: Obstructive sleep apnea (adult) (pediatric): G47.33

## 2017-03-25 MED ORDER — GABAPENTIN 600 MG PO TABS: 600.0000 mg | ORAL_TABLET | Freq: Every day | ORAL | 3 refills | Status: DC

## 2017-03-25 NOTE — Patient Instructions (Signed)
Gabapentin capsules or tablets What is this medicine? GABAPENTIN (GA ba pen tin) is used to control partial seizures in adults with epilepsy. It is also used to treat certain types of nerve pain. This medicine may be used for other purposes; ask your health care provider or pharmacist if you have questions. COMMON BRAND NAME(S): Active-PAC with Gabapentin, Gabarone, Neurontin What should I tell my health care provider before I take this medicine? They need to know if you have any of these conditions: -kidney disease -suicidal thoughts, plans, or attempt; a previous suicide attempt by you or a family member -an unusual or allergic reaction to gabapentin, other medicines, foods, dyes, or preservatives -pregnant or trying to get pregnant -breast-feeding How should I use this medicine? Take this medicine by mouth with a glass of water. Follow the directions on the prescription label. You can take it with or without food. If it upsets your stomach, take it with food.Take your medicine at regular intervals. Do not take it more often than directed. Do not stop taking except on your doctor's advice. If you are directed to break the 600 or 800 mg tablets in half as part of your dose, the extra half tablet should be used for the next dose. If you have not used the extra half tablet within 28 days, it should be thrown away. A special MedGuide will be given to you by the pharmacist with each prescription and refill. Be sure to read this information carefully each time. Talk to your pediatrician regarding the use of this medicine in children. Special care may be needed. Overdosage: If you think you have taken too much of this medicine contact a poison control center or emergency room at once. NOTE: This medicine is only for you. Do not share this medicine with others. What if I miss a dose? If you miss a dose, take it as soon as you can. If it is almost time for your next dose, take only that dose. Do not  take double or extra doses. What may interact with this medicine? Do not take this medicine with any of the following medications: -other gabapentin products This medicine may also interact with the following medications: -alcohol -antacids -antihistamines for allergy, cough and cold -certain medicines for anxiety or sleep -certain medicines for depression or psychotic disturbances -homatropine; hydrocodone -naproxen -narcotic medicines (opiates) for pain -phenothiazines like chlorpromazine, mesoridazine, prochlorperazine, thioridazine This list may not describe all possible interactions. Give your health care provider a list of all the medicines, herbs, non-prescription drugs, or dietary supplements you use. Also tell them if you smoke, drink alcohol, or use illegal drugs. Some items may interact with your medicine. What should I watch for while using this medicine? Visit your doctor or health care professional for regular checks on your progress. You may want to keep a record at home of how you feel your condition is responding to treatment. You may want to share this information with your doctor or health care professional at each visit. You should contact your doctor or health care professional if your seizures get worse or if you have any new types of seizures. Do not stop taking this medicine or any of your seizure medicines unless instructed by your doctor or health care professional. Stopping your medicine suddenly can increase your seizures or their severity. Wear a medical identification bracelet or chain if you are taking this medicine for seizures, and carry a card that lists all your medications. You may get drowsy, dizzy,   or have blurred vision. Do not drive, use machinery, or do anything that needs mental alertness until you know how this medicine affects you. To reduce dizzy or fainting spells, do not sit or stand up quickly, especially if you are an older patient. Alcohol can  increase drowsiness and dizziness. Avoid alcoholic drinks. Your mouth may get dry. Chewing sugarless gum or sucking hard candy, and drinking plenty of water will help. The use of this medicine may increase the chance of suicidal thoughts or actions. Pay special attention to how you are responding while on this medicine. Any worsening of mood, or thoughts of suicide or dying should be reported to your health care professional right away. Women who become pregnant while using this medicine may enroll in the [place] American Antiepileptic Drug Pregnancy Registry by calling 1-888-233-2334. This registry collects information about the safety of antiepileptic drug use during pregnancy. What side effects may I notice from receiving this medicine? Side effects that you should report to your doctor or health care professional as soon as possible: -allergic reactions like skin rash, itching or hives, swelling of the face, lips, or tongue -worsening of mood, thoughts or actions of suicide or dying Side effects that usually do not require medical attention (report to your doctor or health care professional if they continue or are bothersome): -constipation -difficulty walking or controlling muscle movements -dizziness -nausea -slurred speech -tiredness -tremors -weight gain This list may not describe all possible side effects. Call your doctor for medical advice about side effects. You may report side effects to FDA at 1-800-FDA-1088. Where should I keep my medicine? Keep out of reach of children. This medicine may cause accidental overdose and death if it taken by other adults, children, or pets. Mix any unused medicine with a substance like cat litter or coffee grounds. Then throw the medicine away in a sealed container like a sealed bag or a coffee can with a lid. Do not use the medicine after the expiration date. Store at room temperature between 15 and 30 degrees C (59 and 86 degrees F). NOTE: This  sheet is a summary. It may not cover all possible information. If you have questions about this medicine, talk to your doctor, pharmacist, or health care provider.  2018 Elsevier/Gold Standard (2013-06-10 15:26:50)  
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
## 2017-03-25 NOTE — Addendum Note (Signed)
Addended by: [name] on: 03/25/2017 10:13 AM   Modules accepted: Orders

## 2017-06-09 ENCOUNTER — Other Ambulatory Visit: Payer: Self-pay | Admitting: Neurology

## 2017-06-09 ENCOUNTER — Telehealth: Payer: Self-pay | Admitting: Neurology

## 2017-06-09 MED ORDER — GABAPENTIN 600 MG PO TABS: 600.0000 mg | ORAL_TABLET | Freq: Every day | ORAL | 2 refills | Status: DC

## 2017-06-09 NOTE — Telephone Encounter (Signed)
Order will be sent to the pharmacy the patient mentions

## 2017-06-09 NOTE — Telephone Encounter (Signed)
Pt request refill for gabapentin (NEURONTIN) 600 MG tablet sent to [place], [place]  Pt did not use refill [facility] to [facility]/25/17. She does not have insurance and is using [facility], at this time it is cheaper to get it a Paediatric nurse

## 2018-03-15 ENCOUNTER — Other Ambulatory Visit: Payer: Self-pay | Admitting: Neurology

## 2018-03-29 ENCOUNTER — Ambulatory Visit: Payer: Self-pay | Admitting: Adult Health

## 2018-05-26 ENCOUNTER — Ambulatory Visit: Payer: Self-pay | Admitting: Neurology

## 2018-06-22 ENCOUNTER — Other Ambulatory Visit: Payer: Self-pay | Admitting: Neurology

## 2018-06-25 ENCOUNTER — Other Ambulatory Visit: Payer: Self-pay | Admitting: Neurology

## 2018-06-25 NOTE — Telephone Encounter (Signed)
Refill was denied because had not been seen since 2018. Appt made refill done.

## 2018-06-25 NOTE — Telephone Encounter (Signed)
Pt states she Is down to her last pill for today . She is asking this be filled by today  gabapentin (NEURONTIN) 600 MG tablet  [facility]

## 2018-06-25 NOTE — Telephone Encounter (Signed)
Refill done until appt in April 2020. Pt last seen 2018

## 2018-08-04 ENCOUNTER — Encounter: Payer: Self-pay | Admitting: Neurology

## 2018-08-04 ENCOUNTER — Telehealth: Payer: Self-pay | Admitting: Neurology

## 2018-08-04 NOTE — Telephone Encounter (Signed)
Pt completed the EPW on phone with me since mychart is not working for her  1) sitting and reading- 1 2) watching TV-1 3) sitting inactive in a public place (ex meeting or theater) - 2 4) as a passenger in a car for an hour without a break- 0 5) lying down to rest in the afternoon- 2 6) sitting and talking to someone-0 7) sitting quietly after lunch (when you've had no alcohol)-1 8) in a car, while stopped in traffic-0  Total= 7

## 2018-08-04 NOTE — Telephone Encounter (Addendum)
Patient confirmed number and address on file. Patient is no longer insured and knows she will be self pay. She states she has done this in previous visits.  Pt is still using her CPAP machine and states that maching was purchased through [facility] but that she is buying supplies online since no longer insured.

## 2018-08-04 NOTE — Telephone Encounter (Signed)
Called the patient to inform them that our office has placed new protocols in place for our office visits. Due to Covid 19 our office is reducing our number of office visits in order to minimize the risk to our patients and healthcare providers.Our office is now providing the capability to offer the patients virtual visits at this time. Informed of what that process looks like and informed that the Virtual visit will still be billed through insurance as such. Pt doesn't have the proper signal to complete a video visit but agrees to a telephone visit since it a follow up apt. Due to [place],informed the patient since the appointment is taking place over the phone, we can't guarantee the security of the phone line. With that said if we do move forward I would have to get verbal consent to completed the call over the phone. Patient gave verbal consent to move forward with the phone visit. I have reviewed the patient's chart and made sure that everything is up to date. Patient is also made aware that since this is a phone visit we are able to complete the visit but a physical exam is not able to be done since the patient is not present in person. Pt verbalized understanding of this information and will states to be ready for the visit at least 15 min prior to the visit.  Pt request to be called on another line (her deceased husbands phone) it has better signal that number is [phone]

## 2018-08-09 ENCOUNTER — Encounter: Payer: Self-pay | Admitting: Adult Health

## 2018-08-10 ENCOUNTER — Encounter: Payer: Self-pay | Admitting: Neurology

## 2018-08-12 ENCOUNTER — Ambulatory Visit (INDEPENDENT_AMBULATORY_CARE_PROVIDER_SITE_OTHER): Payer: Self-pay | Admitting: Neurology

## 2018-08-12 ENCOUNTER — Other Ambulatory Visit: Payer: Self-pay

## 2018-08-12 ENCOUNTER — Encounter: Payer: Self-pay | Admitting: Neurology

## 2018-08-12 DIAGNOSIS — G4733 Obstructive sleep apnea (adult) (pediatric): Secondary | ICD-10-CM

## 2018-08-12 DIAGNOSIS — G2581 Restless legs syndrome: Secondary | ICD-10-CM

## 2018-08-12 DIAGNOSIS — Z9989 Dependence on other enabling machines and devices: Secondary | ICD-10-CM

## 2018-08-12 DIAGNOSIS — M5416 Radiculopathy, lumbar region: Secondary | ICD-10-CM

## 2018-08-12 HISTORY — DX: Radiculopathy, lumbar region: M54.16

## 2018-08-12 MED ORDER — GABAPENTIN 600 MG PO TABS: 600.0000 mg | ORAL_TABLET | Freq: Every day | ORAL | 3 refills | Status: DC
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
## 2018-08-12 NOTE — Patient Instructions (Signed)
Gabapentin capsules or tablets What is this medicine? GABAPENTIN (GA ba pen tin) is used to control seizures in certain types of epilepsy. It is also used to treat certain types of nerve pain. This medicine may be used for other purposes; ask your health care provider or pharmacist if you have questions. COMMON BRAND NAME(S): Active-PAC with Gabapentin, Gabarone, Neurontin What should I tell my health care provider before I take this medicine? They need to know if you have any of these conditions: -kidney disease -suicidal thoughts, plans, or attempt; a previous suicide attempt by you or a family member -an unusual or allergic reaction to gabapentin, other medicines, foods, dyes, or preservatives -pregnant or trying to get pregnant -breast-feeding How should I use this medicine? Take this medicine by mouth with a glass of water. Follow the directions on the prescription label. You can take it with or without food. If it upsets your stomach, take it with food. Take your medicine at regular intervals. Do not take it more often than directed. Do not stop taking except on your doctor's advice. If you are directed to break the 600 or 800 mg tablets in half as part of your dose, the extra half tablet should be used for the next dose. If you have not used the extra half tablet within 28 days, it should be thrown away. A special MedGuide will be given to you by the pharmacist with each prescription and refill. Be sure to read this information carefully each time. Talk to your pediatrician regarding the use of this medicine in children. While this drug may be prescribed for children as young as 3 years for selected conditions, precautions do apply. Overdosage: If you think you have taken too much of this medicine contact a poison control center or emergency room at once. NOTE: This medicine is only for you. Do not share this medicine with others. What if I miss a dose? If you miss a dose, take it as soon  as you can. If it is almost time for your next dose, take only that dose. Do not take double or extra doses. What may interact with this medicine? Do not take this medicine with any of the following medications: -other gabapentin products This medicine may also interact with the following medications: -alcohol -antacids -antihistamines for allergy, cough and cold -certain medicines for anxiety or sleep -certain medicines for depression or psychotic disturbances -homatropine; hydrocodone -naproxen -narcotic medicines (opiates) for pain -phenothiazines like chlorpromazine, mesoridazine, prochlorperazine, thioridazine This list may not describe all possible interactions. Give your health care provider a list of all the medicines, herbs, non-prescription drugs, or dietary supplements you use. Also tell them if you smoke, drink alcohol, or use illegal drugs. Some items may interact with your medicine. What should I watch for while using this medicine? Visit your doctor or health care professional for regular checks on your progress. You may want to keep a record at home of how you feel your condition is responding to treatment. You may want to share this information with your doctor or health care professional at each visit. You should contact your doctor or health care professional if your seizures get worse or if you have any new types of seizures. Do not stop taking this medicine or any of your seizure medicines unless instructed by your doctor or health care professional. Stopping your medicine suddenly can increase your seizures or their severity. Wear a medical identification bracelet or chain if you are taking this medicine for   seizures, and carry a card that lists all your medications. You may get drowsy, dizzy, or have blurred vision. Do not drive, use machinery, or do anything that needs mental alertness until you know how this medicine affects you. To reduce dizzy or fainting spells, do not  sit or stand up quickly, especially if you are an older patient. Alcohol can increase drowsiness and dizziness. Avoid alcoholic drinks. Your mouth may get dry. Chewing sugarless gum or sucking hard candy, and drinking plenty of water will help. The use of this medicine may increase the chance of suicidal thoughts or actions. Pay special attention to how you are responding while on this medicine. Any worsening of mood, or thoughts of suicide or dying should be reported to your health care professional right away. Women who become pregnant while using this medicine may enroll in the [place] Antiepileptic Drug Pregnancy Registry by calling 1-888-233-2334. This registry collects information about the safety of antiepileptic drug use during pregnancy. What side effects may I notice from receiving this medicine? Side effects that you should report to your doctor or health care professional as soon as possible: -allergic reactions like skin rash, itching or hives, swelling of the face, lips, or tongue -worsening of mood, thoughts or actions of suicide or dying Side effects that usually do not require medical attention (report to your doctor or health care professional if they continue or are bothersome): -constipation -difficulty walking or controlling muscle movements -dizziness -nausea -slurred speech -tiredness -tremors -weight gain This list may not describe all possible side effects. Call your doctor for medical advice about side effects. You may report side effects to FDA at 1-800-FDA-1088. Where should I keep my medicine? Keep out of reach of children. This medicine may cause accidental overdose and death if it taken by other adults, children, or pets. Mix any unused medicine with a substance like cat litter or coffee grounds. Then throw the medicine away in a sealed container like a sealed bag or a coffee can with a lid. Do not use the medicine after the expiration date. Store at room  temperature between 15 and 30 degrees C (59 and 86 degrees F). NOTE: This sheet is a summary. It may not cover all possible information. If you have questions about this medicine, talk to your doctor, pharmacist, or health care provider.  2019 Elsevier/Gold Standard (2017-09-17 13:21:44)  

## 2019-01-19 ENCOUNTER — Encounter: Payer: Self-pay | Admitting: Gynecology

## 2019-04-11 ENCOUNTER — Ambulatory Visit: Payer: Self-pay | Admitting: Adult Health

## 2019-08-05 ENCOUNTER — Encounter: Payer: Self-pay | Admitting: Adult Health

## 2019-09-06 ENCOUNTER — Encounter: Payer: Self-pay | Admitting: Adult Health

## 2019-09-06 ENCOUNTER — Ambulatory Visit: Payer: Medicare PPO | Admitting: Adult Health

## 2019-09-06 ENCOUNTER — Other Ambulatory Visit: Payer: Self-pay

## 2019-09-06 VITALS — BP 154/94 | HR 81 | Temp 97.7°F | Ht 63.0 in | Wt 260.0 lb

## 2019-09-06 DIAGNOSIS — Z9989 Dependence on other enabling machines and devices: Secondary | ICD-10-CM

## 2019-09-06 DIAGNOSIS — G4733 Obstructive sleep apnea (adult) (pediatric): Secondary | ICD-10-CM

## 2019-09-06 NOTE — Progress Notes (Signed)
PATIENT: Renee Flores DOB: 1965-08-10  REASON FOR VISIT: follow up HISTORY FROM: patient  HISTORY OF PRESENT ILLNESS: Today 09/06/19:  Renee Flores is a 54 year old female with a history of obstructive sleep apnea on CPAP.  Her download indicates that she uses her machine 29 out of 30 days for compliance of 97%.  She used her machine greater than 4 hours 26 days for compliance of 87%.  On average she uses her machine 6 hours and 6 minutes.  Her residual AHI is 1.2 on 7 cm of water with EPR 2.  Leak in the 95th percentile is 19.3 L/min.  Reports that the CPAP continues to work well for her.  HISTORY (Copied from Dr.Dohmeier's note)  OSA on CPAP- download. CPAP used 30/ 30 days, 93% over 4 hours, AHI is 2.1 and set at 7 CM water CPAP Machine was issued in 2014, but patient was not health insured.  She is accepted for disability, Medicare will follow on November 2020.  Uses a benefit card for prescriptions   REVIEW OF SYSTEMS: Out of a complete 14 system review of symptoms, the patient complains only of the following symptoms, and all other reviewed systems are negative.  FSS 47 ESS 17  ALLERGIES: Allergies  Allergen Reactions  . Erythromycin Base Hives  . Morphine Other (See Comments)    Hallucinating   . Vancomycin Hcl Other (See Comments)    Ringing in ears  . Doxycycline Nausea Only and Rash    HOME MEDICATIONS: Outpatient Medications Prior to Visit  Medication Sig Dispense Refill  . omeprazole (PRILOSEC) 40 MG capsule Take 20 mg by mouth daily.    Marland Kitchen aspirin EC 81 MG tablet Take 81 mg by mouth daily.    . Calcium Carb-Cholecalciferol (CALCIUM + D3) 600-200 MG-UNIT TABS Take 1 tablet by mouth daily.    . cloNIDine (CATAPRES) 0.1 MG tablet Take 0.1 mg by mouth 3 (three) times daily.     Marland Kitchen esomeprazole (NEXIUM) 40 MG capsule Take 40 mg by mouth daily before breakfast.    . fluticasone (FLONASE) 50 MCG/ACT nasal spray Place 2 sprays into the nose daily as needed for  allergies.     Marland Kitchen gabapentin (NEURONTIN) 600 MG tablet Take 1 tablet (600 mg total) by mouth at bedtime. 90 tablet 3  . hydrochlorothiazide (HYDRODIURIL) 25 MG tablet TAKE 1 TABLET BY MOUTH  DAILY 30 tablet 11  . ketotifen (ALAWAY) 0.025 % ophthalmic solution Place 2 drops into both eyes daily as needed (for irritation).     Marland Kitchen losartan (COZAAR) 50 MG tablet Take 50 mg by mouth daily.     . meloxicam (MOBIC) 15 MG tablet TAKE 1 TABLET BY MOUTH EVERY DAY 30 tablet 0  . metoprolol succinate (TOPROL-XL) 25 MG 24 hr tablet Take 1 tablet by mouth  daily 90 tablet 1  . montelukast (SINGULAIR) 10 MG tablet Take 10 mg by mouth at bedtime.    . simvastatin (ZOCOR) 40 MG tablet Take 40 mg by mouth at bedtime.       No facility-administered medications prior to visit.    PAST MEDICAL HISTORY: Past Medical History:  Diagnosis Date  . Arthritis   . Bulging lumbar disc    L4-5  . Complication of anesthesia    anxiety attack after waking up from anesthesia  . Factor 5 Leiden mutation, heterozygous ([place])   . GERD (gastroesophageal reflux disease)   . Heart murmur   . Heart palpitations   . Heart  valve problem    mild- moderate mitral valve leakage   . High cholesterol   . Hypertension   . MS (multiple sclerosis) ([place])    questionable  . Osteopenia 04/2016   T score -1.9  . Vitamin D deficiency 2014   17    PAST SURGICAL HISTORY: Past Surgical History:  Procedure Laterality Date  . ABDOMINAL HYSTERECTOMY    . APPENDECTOMY    . arthroscopic knee surgery    . CESAREAN SECTION     x2  . chloecystectomy    . CHOLECYSTECTOMY    . ESOPHAGEAL DILATION  04/09/2012  . KNEE LIGAMENT RECONSTRUCTION    . laser throat polyps    . NECK LESION BIOPSY    . PATELLA FRACTURE SURGERY    . PELVIC LAPAROSCOPY     LSO  . TOE SURGERY    . TONSILLECTOMY    . TOTAL ABDOMINAL HYSTERECTOMY W/ BILATERAL SALPINGOOPHORECTOMY  2000  . TOTAL KNEE ARTHROPLASTY Left 2012  . TOTAL KNEE ARTHROPLASTY Right  07/08/2012   Procedure: TOTAL KNEE ARTHROPLASTY with revision components ;  Surgeon: Hessie Dibble, MD;  Location: [facility];  Service: Orthopedics;  Laterality: Right;  PATIENT HAS HX. OF FACTOR IV DISORFER  . TUBAL LIGATION      FAMILY HISTORY: Family History  Problem Relation Age of Onset  . Heart disease Father   . Hypertension Father   . Hyperlipidemia Father   . Cancer Father   . Other Father        Respiratory problems/Factor 5 disorder/1 bypass, 2 valve replacement (mitral and aortic)   . Hypertension Mother   . Diabetes Mother   . Hyperlipidemia Mother   . Hypertension Sister   . Colon cancer Paternal Aunt   . Breast cancer Paternal Aunt 76  . Heart disease Maternal Grandfather   . Lymphoma Maternal Grandfather   . Heart failure Maternal Grandfather   . Colon cancer Paternal Grandmother   . Hyperlipidemia Brother   . Hypertension Brother   . Hypertension Brother   . Colon cancer Paternal Aunt   . Breast cancer Paternal Aunt 58  . Stroke Paternal Grandfather     SOCIAL HISTORY: Social History   Socioeconomic History  . Marital status: Married    Spouse name: Renee Flores  . Number of children: 2  . Years of education: 48  . Highest education level: Not on file  Occupational History    Employer: [employer]  Tobacco Use  . Smoking status: Never Smoker  . Smokeless tobacco: Never Used  Substance and Sexual Activity  . Alcohol use: Yes    Alcohol/week: 0.0 standard drinks    Comment: Very rare  . Drug use: No  . Sexual activity: Yes    Birth control/protection: Surgical    Comment: HYST-1st intercourse 54 yo-Fewer than 5 partners  Other Topics Concern  . Not on file  Social History Narrative   Patient is married (Renee Flores)  and lives at home with her husband.   Patient has two children.   Patient works as a Mudlogger for [employer].   Patient drinks one cup of caffeine daily.   Patient is right-handed.   Patient has a high school education.    Social Determinants of Health   Financial Resource Strain:   . Difficulty of Paying Living Expenses:   Food Insecurity:   . Worried About Charity fundraiser in the Last Year:   . [place] in the  Last Year:   Transportation Needs:   . Film/video editor (Medical):   Marland Kitchen Lack of Transportation (Non-Medical):   Physical Activity:   . Days of Exercise per Week:   . Minutes of Exercise per Session:   Stress:   . Feeling of Stress :   Social Connections:   . Frequency of Communication with Friends and Family:   . Frequency of Social Gatherings with Friends and Family:   . Attends Religious Services:   . Active Member of Clubs or Organizations:   . Attends Archivist Meetings:   Marland Kitchen Marital Status:   Intimate Partner Violence:   . Fear of Current or Ex-Partner:   . Emotionally Abused:   Marland Kitchen Physically Abused:   . Sexually Abused:       PHYSICAL EXAM  Vitals:   09/06/19 0755  BP: (!) 154/94  Pulse: 81  Temp: 97.7 F (36.5 C)  Weight: 260 lb (117.9 kg)  Height: 5\' 3"  (1.6 m)   Body mass index is 46.06 kg/m.  Generalized: Well developed, in no acute distress  Chest: Lungs clear to auscultation bilaterally  Neurological examination  Mentation: Alert oriented to time, place, history taking. Follows all commands speech and language fluent Cranial nerve II-XII: Extraocular movements were full, visual field were full on confrontational test Head turning and shoulder shrug  were normal and symmetric. Motor: The motor testing reveals 5 over 5 strength of all 4 extremities. Good symmetric motor tone is noted throughout.  Sensory: Sensory testing is intact to soft touch on all 4 extremities. No evidence of extinction is noted.  Gait and station: Gait is normal.    DIAGNOSTIC DATA (LABS, IMAGING, TESTING) - I reviewed patient records, labs, notes, testing and imaging myself where available.  Lab Results  Component Value Date   WBC 6.6 06/06/2016   HGB  12.1 06/06/2016   HCT 37.3 06/06/2016   MCV 89.0 06/06/2016   PLT 256 06/06/2016      Component Value Date/Time   NA 139 06/01/2013 0925   K 4.3 06/01/2013 0925   CL 101 06/01/2013 0925   CO2 25 06/01/2013 0925   GLUCOSE 91 06/01/2013 0925   BUN 10 06/01/2013 0925   CREATININE 0.68 06/01/2013 0925   CALCIUM 8.9 06/01/2013 0925   PROT 7.6 06/01/2013 0925   ALBUMIN 3.8 06/01/2013 0925   AST 21 06/01/2013 0925   ALT 19 06/01/2013 0925   ALKPHOS 82 06/01/2013 0925   BILITOT 0.3 06/01/2013 0925   GFRNONAA >90 06/01/2013 0925   GFRAA >90 06/01/2013 0925      ASSESSMENT AND PLAN 54 y.o. year old female  has a past medical history of Arthritis, Bulging lumbar disc, Complication of anesthesia, Factor 5 Leiden mutation, heterozygous ([place]), GERD (gastroesophageal reflux disease), Heart murmur, Heart palpitations, Heart valve problem, High cholesterol, Hypertension, MS (multiple sclerosis) ([place]), Osteopenia (04/2016), and Vitamin D deficiency (2014). here with:  1. OSA on CPAP  - CPAP compliance excellent - Good treatment of AHI  - Encourage patient to use CPAP nightly and > 4 hours each night - F/U in 1 year or sooner if needed   I spent 20 minutes of face-to-face and non-face-to-face time with patient.  This included previsit chart review, lab review, study review, order entry, electronic health record documentation, patient education.  Ward Givens, MSN, NP-C 09/06/2019, 7:55 AM [facility] [address] [place], [place] [address] [phone]

## 2019-09-06 NOTE — Patient Instructions (Signed)
Continue using CPAP nightly and greater than 4 hours each night °If your symptoms worsen or you develop new symptoms please let us know.  ° °

## 2020-03-12 ENCOUNTER — Telehealth: Payer: Self-pay

## 2020-03-12 NOTE — Telephone Encounter (Signed)
Ok to establish 

## 2020-03-12 NOTE — Telephone Encounter (Signed)
Patient would like to be seen by you as a new patient  - patient's parents are linda & don overman. Please advise.

## 2020-03-13 ENCOUNTER — Other Ambulatory Visit: Payer: Self-pay | Admitting: Orthopaedic Surgery

## 2020-03-13 DIAGNOSIS — M25572 Pain in left ankle and joints of left foot: Secondary | ICD-10-CM

## 2020-03-16 ENCOUNTER — Ambulatory Visit
Admission: RE | Admit: 2020-03-16 | Discharge: 2020-03-16 | Disposition: A | Discharge status: Home / Self Care | Payer: 59 | Source: Ambulatory Visit | Attending: Orthopaedic Surgery | Admitting: Orthopaedic Surgery

## 2020-03-16 DIAGNOSIS — M25572 Pain in left ankle and joints of left foot: Secondary | ICD-10-CM

## 2020-03-20 ENCOUNTER — Telehealth: Payer: Self-pay | Admitting: Adult Health

## 2020-03-20 DIAGNOSIS — G4733 Obstructive sleep apnea (adult) (pediatric): Secondary | ICD-10-CM

## 2020-03-20 NOTE — Telephone Encounter (Signed)
Last seen 08-2019, ok for another cpap machine to [facility] for new machine.  LMVM for pt tat received message.

## 2020-03-20 NOTE — Addendum Note (Signed)
Addended by: Brandon Melnick on: 03/20/2020 03:11 PM   Modules accepted: Orders

## 2020-03-20 NOTE — Telephone Encounter (Signed)
Pt has called to know if Dr Brett Fairy will be sending the Rx for a new CPAP to [facility], please call.

## 2020-03-20 NOTE — Addendum Note (Signed)
Addended by: Trudie Buckler on: 03/20/2020 03:43 PM   Modules accepted: Orders

## 2020-03-21 NOTE — Telephone Encounter (Signed)
Pt. states she received vm yesterday & wants to know if Dr[facility]. Please advise.

## 2020-03-21 NOTE — Telephone Encounter (Addendum)
Hi called pt and relayed that I just sent at message to them CM that new cpap order in Epic.  Pt made aware.  So should receive call at sometime from [facility].

## 2020-03-26 NOTE — Telephone Encounter (Signed)
RE: new cpap order Received: Adaline Sill, Wilmer Floor, RN Thank you, will process.

## 2020-03-27 NOTE — Addendum Note (Signed)
Addended by: Trudie Buckler on: 03/27/2020 10:24 AM   Modules accepted: Orders, Level of Service

## 2020-03-27 NOTE — Telephone Encounter (Signed)
Jacquelyne Balint, RN Got it. FYI this is the third time I've gotten this order lol.

## 2020-03-27 NOTE — Telephone Encounter (Signed)
Sent message to [facility] for cpap with pressure settings.

## 2020-03-28 NOTE — Progress Notes (Signed)
Order for New machine 7cm H20 with EPR 2 sent to [facility] via community msg. Confirmation received that the order transmitted was successful.

## 2020-04-13 ENCOUNTER — Other Ambulatory Visit: Payer: Self-pay

## 2020-04-13 ENCOUNTER — Encounter: Payer: Self-pay | Admitting: Family Medicine

## 2020-04-13 ENCOUNTER — Ambulatory Visit (INDEPENDENT_AMBULATORY_CARE_PROVIDER_SITE_OTHER): Payer: Medicare HMO | Admitting: Family Medicine

## 2020-04-13 VITALS — BP 140/82 | HR 82 | Temp 98.4°F | Resp 16 | Ht 63.0 in | Wt 252.2 lb

## 2020-04-13 DIAGNOSIS — D682 Hereditary deficiency of other clotting factors: Secondary | ICD-10-CM | POA: Diagnosis not present

## 2020-04-13 DIAGNOSIS — I10 Essential (primary) hypertension: Secondary | ICD-10-CM

## 2020-04-13 DIAGNOSIS — G4733 Obstructive sleep apnea (adult) (pediatric): Secondary | ICD-10-CM | POA: Diagnosis not present

## 2020-04-13 DIAGNOSIS — I340 Nonrheumatic mitral (valve) insufficiency: Secondary | ICD-10-CM

## 2020-04-13 DIAGNOSIS — Z9989 Dependence on other enabling machines and devices: Secondary | ICD-10-CM

## 2020-04-13 DIAGNOSIS — G709 Myoneural disorder, unspecified: Secondary | ICD-10-CM | POA: Insufficient documentation

## 2020-04-13 DIAGNOSIS — E7800 Pure hypercholesterolemia, unspecified: Secondary | ICD-10-CM

## 2020-04-13 NOTE — Patient Instructions (Addendum)
Follow up in late Jan or early Feb to recheck blood pressure and adjust medications Please schedule a lab visit at your convenience (location of your choice) or go to [facility] for labs (no appt needed)  We'll notify you of your lab results and make any changes if needed We'll call you with your cardiology appt so they can clear you for surgery Try and make healthy food choices and exercise as able Call with any questions or concerns Stay Safe!  Stay Healthy! Happy Holidays!

## 2020-04-13 NOTE — Progress Notes (Signed)
° °  Subjective:    Patient ID: Renee Flores, female    DOB: Dec 08, 1965, 54 y.o.   MRN: [id number]  HPI New to establish.  Recently moved back from [facility] (moved down 2017 and returned 2021)  HTN- chronic problem, on Losartan 50mg  daily, Clonidine 0.1mg  TID, Metoprolol 25mg  daily, Losartan 50mg  daily, HCTZ 25mg  daily.  No CP, SOB, HAs above baseline, visual changes,   Mitral regurgitation- noted to be mild to moderate on ECHO in 2013.  + swelling periodically.  Has not seen cardiology since 2015.  Hyperlipidemia- chronic problem, on Simvastatin 40mg  daily.  Denies abd pain, N/V.  Morbid obesity- pt's BMI is 44.68  OSA- following w/ DR Dohmeier.  Waiting on new CPAP  Factor V- chronic problem, was told to take daily ASA.  Has never had a clot.  Polyarthralgia- pt has had multiple surgeries and joint replacements.  Now on disability.  Health Maintenance- UTD on mammo, UTD on colonoscopy ([facility]), declines flu shot   Review of Systems For ROS see HPI   This visit occurred during the SARS-CoV-2 public health emergency.  Safety protocols were in place, including screening questions prior to the visit, additional usage of staff PPE, and extensive cleaning of exam room while observing appropriate contact time as indicated for disinfecting solutions.       Objective:   Physical Exam Vitals reviewed.  Constitutional:      General: She is not in acute distress.    Appearance: She is well-developed. She is obese. She is not ill-appearing.  HENT:     Head: Normocephalic and atraumatic.  Eyes:     Conjunctiva/sclera: Conjunctivae normal.     Pupils: Pupils are equal, round, and reactive to light.  Neck:     Thyroid: No thyromegaly.  Cardiovascular:     Rate and Rhythm: Normal rate and regular rhythm.     Pulses: Normal pulses.     Heart sounds: Normal heart sounds. No murmur heard. Pulmonary:     Effort: Pulmonary effort is normal. No respiratory distress.     Breath sounds: Normal  breath sounds.  Abdominal:     General: There is no distension.     Palpations: Abdomen is soft.     Tenderness: There is no abdominal tenderness.  Musculoskeletal:     Cervical back: Normal range of motion and neck supple.     Right lower leg: No edema.     Left lower leg: No edema.  Lymphadenopathy:     Cervical: No cervical adenopathy.  Skin:    General: Skin is warm and dry.  Neurological:     Mental Status: She is alert and oriented to person, place, and time.  Psychiatric:        Behavior: Behavior normal.          Assessment & Plan:  Moderate mitral regurg- noted on ECHO in 2013.  She is having increased swelling recently but has not seen cardiology since before she moved to [facility].  Will refer for complete evaluation and tx.  Pt expressed understanding and is in agreement w/ plan.

## 2020-04-16 ENCOUNTER — Other Ambulatory Visit: Payer: Self-pay

## 2020-04-16 ENCOUNTER — Telehealth: Payer: Self-pay | Admitting: Family Medicine

## 2020-04-16 DIAGNOSIS — I340 Nonrheumatic mitral (valve) insufficiency: Secondary | ICD-10-CM

## 2020-04-16 DIAGNOSIS — M2619 Other specified anomalies of jaw-cranial base relationship: Secondary | ICD-10-CM

## 2020-04-16 DIAGNOSIS — R52 Pain, unspecified: Secondary | ICD-10-CM

## 2020-04-16 DIAGNOSIS — K219 Gastro-esophageal reflux disease without esophagitis: Secondary | ICD-10-CM

## 2020-04-16 DIAGNOSIS — M8580 Other specified disorders of bone density and structure, unspecified site: Secondary | ICD-10-CM

## 2020-04-16 DIAGNOSIS — K2100 Gastro-esophageal reflux disease with esophagitis, without bleeding: Secondary | ICD-10-CM

## 2020-04-16 DIAGNOSIS — I10 Essential (primary) hypertension: Secondary | ICD-10-CM

## 2020-04-16 DIAGNOSIS — E785 Hyperlipidemia, unspecified: Secondary | ICD-10-CM

## 2020-04-16 DIAGNOSIS — E7800 Pure hypercholesterolemia, unspecified: Secondary | ICD-10-CM

## 2020-04-16 DIAGNOSIS — M5416 Radiculopathy, lumbar region: Secondary | ICD-10-CM

## 2020-04-16 DIAGNOSIS — R0602 Shortness of breath: Secondary | ICD-10-CM

## 2020-04-16 DIAGNOSIS — E1169 Type 2 diabetes mellitus with other specified complication: Secondary | ICD-10-CM

## 2020-04-16 DIAGNOSIS — T7840XA Allergy, unspecified, initial encounter: Secondary | ICD-10-CM

## 2020-04-16 DIAGNOSIS — I270 Primary pulmonary hypertension: Secondary | ICD-10-CM

## 2020-04-16 MED ORDER — KETOTIFEN FUMARATE 0.025 % OP SOLN: 2.0000 [drp] | Freq: Every day | OPHTHALMIC | 0 refills | Status: AC | PRN

## 2020-04-16 MED ORDER — MONTELUKAST SODIUM 10 MG PO TABS: 10.0000 mg | ORAL_TABLET | Freq: Every day | ORAL | 0 refills | Status: DC

## 2020-04-16 MED ORDER — CLONIDINE HCL 0.1 MG PO TABS: 0.1000 mg | ORAL_TABLET | Freq: Three times a day (TID) | ORAL | 0 refills | Status: DC

## 2020-04-16 MED ORDER — METOPROLOL SUCCINATE ER 25 MG PO TB24: 25.0000 mg | ORAL_TABLET | Freq: Every day | ORAL | 0 refills | Status: DC

## 2020-04-16 MED ORDER — OMEPRAZOLE 40 MG PO CPDR: 40.0000 mg | DELAYED_RELEASE_CAPSULE | Freq: Every day | ORAL | 0 refills | Status: DC

## 2020-04-16 MED ORDER — GABAPENTIN 600 MG PO TABS: 600.0000 mg | ORAL_TABLET | Freq: Every day | ORAL | 0 refills | Status: DC

## 2020-04-16 MED ORDER — HYDROCHLOROTHIAZIDE 25 MG PO TABS: 25.0000 mg | ORAL_TABLET | Freq: Every day | ORAL | 0 refills | Status: DC

## 2020-04-16 MED ORDER — CALCIUM CARBONATE-VITAMIN D 500-200 MG-UNIT PO TABS: 1.0000 | ORAL_TABLET | Freq: Every day | ORAL | 0 refills | Status: DC

## 2020-04-16 MED ORDER — SIMVASTATIN 40 MG PO TABS: 40.0000 mg | ORAL_TABLET | Freq: Every day | ORAL | 0 refills | Status: DC

## 2020-04-16 MED ORDER — MELOXICAM 15 MG PO TABS: 15.0000 mg | ORAL_TABLET | Freq: Every day | ORAL | 0 refills | Status: DC

## 2020-04-16 MED ORDER — LOSARTAN POTASSIUM 50 MG PO TABS: 50.0000 mg | ORAL_TABLET | Freq: Every day | ORAL | 0 refills | Status: DC

## 2020-04-16 NOTE — Telephone Encounter (Signed)
Ok to fill med for 90 day supply, 1 refill

## 2020-04-16 NOTE — Telephone Encounter (Signed)
Pt called in stating that she needs refills on all her medications besides the albuterol and the inhaler. She states that she will be out next week. She uses [facility] order.  Please advise

## 2020-04-16 NOTE — Telephone Encounter (Signed)
All Meds refilled. Patient Notified

## 2020-04-17 ENCOUNTER — Telehealth: Payer: Self-pay

## 2020-04-17 NOTE — Telephone Encounter (Signed)
Called and spoke patient regarding her Rx for her eye drops. Per Dr. Birdie Riddle her eye drops must come from her eye Dr. Abbott Pao understood. No further concerns at this time.

## 2020-04-18 ENCOUNTER — Other Ambulatory Visit: Payer: Self-pay

## 2020-04-18 ENCOUNTER — Ambulatory Visit (INDEPENDENT_AMBULATORY_CARE_PROVIDER_SITE_OTHER): Payer: Medicare HMO

## 2020-04-18 DIAGNOSIS — E7800 Pure hypercholesterolemia, unspecified: Secondary | ICD-10-CM | POA: Diagnosis not present

## 2020-04-18 DIAGNOSIS — I10 Essential (primary) hypertension: Secondary | ICD-10-CM | POA: Diagnosis not present

## 2020-04-18 LAB — LIPID PANEL
Cholesterol: 186 mg/dL (ref 0–200)
HDL: 56.5 mg/dL (ref >39.00)
NonHDL: 129.45
Total CHOL/HDL Ratio: 3
Triglycerides: 280 mg/dL — ABNORMAL HIGH (ref 0.0–149.0)
VLDL: 56 mg/dL — ABNORMAL HIGH (ref 0.0–40.0)

## 2020-04-18 LAB — HEPATIC FUNCTION PANEL
ALT: 28 U/L (ref 0–35)
AST: 25 U/L (ref 0–37)
Albumin: 4.5 g/dL (ref 3.5–5.2)
Alkaline Phosphatase: 91 U/L (ref 39–117)
Bilirubin, Direct: 0.1 mg/dL (ref 0.0–0.3)
Total Bilirubin: 0.5 mg/dL (ref 0.2–1.2)
Total Protein: 7.6 g/dL (ref 6.0–8.3)

## 2020-04-18 LAB — BASIC METABOLIC PANEL
BUN: 10 mg/dL (ref 6–23)
CO2: 29 mEq/L (ref 19–32)
Calcium: 9.8 mg/dL (ref 8.4–10.5)
Chloride: 100 mEq/L (ref 96–112)
Creatinine, Ser: 0.81 mg/dL (ref 0.40–1.20)
GFR: 82.87 mL/min (ref >60.00)
Glucose, Bld: 99 mg/dL (ref 70–99)
Potassium: 4.4 mEq/L (ref 3.5–5.1)
Sodium: 139 mEq/L (ref 135–145)

## 2020-04-18 LAB — CBC WITH DIFFERENTIAL/PLATELET
Basophils Absolute: 0.1 10*3/uL (ref 0.0–0.1)
Basophils Relative: 1 % (ref 0.0–3.0)
Eosinophils Absolute: 0.5 10*3/uL (ref 0.0–0.7)
Eosinophils Relative: 5.1 % — ABNORMAL HIGH (ref 0.0–5.0)
HCT: 37.1 % (ref 36.0–46.0)
Hemoglobin: 12.2 g/dL (ref 12.0–15.0)
Lymphocytes Relative: 33.2 % (ref 12.0–46.0)
Lymphs Abs: 3.1 10*3/uL (ref 0.7–4.0)
MCHC: 32.9 g/dL (ref 30.0–36.0)
MCV: 89.2 fl (ref 78.0–100.0)
Monocytes Absolute: 0.6 10*3/uL (ref 0.1–1.0)
Monocytes Relative: 6.7 % (ref 3.0–12.0)
Neutro Abs: 5 10*3/uL (ref 1.4–7.7)
Neutrophils Relative %: 54 % (ref 43.0–77.0)
Platelets: 297 10*3/uL (ref 150.0–400.0)
RBC: 4.16 Mil/uL (ref 3.87–5.11)
RDW: 12.8 % (ref 11.5–15.5)
WBC: 9.3 10*3/uL (ref 4.0–10.5)

## 2020-04-18 LAB — LDL CHOLESTEROL, DIRECT: Direct LDL: 98 mg/dL

## 2020-04-18 LAB — TSH: TSH: 1.58 u[IU]/mL (ref 0.35–4.50)

## 2020-04-23 ENCOUNTER — Telehealth: Payer: Self-pay | Admitting: Family Medicine

## 2020-04-23 ENCOUNTER — Telehealth: Payer: Self-pay

## 2020-04-23 DIAGNOSIS — G35 Multiple sclerosis: Secondary | ICD-10-CM | POA: Insufficient documentation

## 2020-04-23 DIAGNOSIS — R002 Palpitations: Secondary | ICD-10-CM | POA: Insufficient documentation

## 2020-04-23 DIAGNOSIS — G709 Myoneural disorder, unspecified: Secondary | ICD-10-CM | POA: Insufficient documentation

## 2020-04-23 DIAGNOSIS — M810 Age-related osteoporosis without current pathological fracture: Secondary | ICD-10-CM | POA: Insufficient documentation

## 2020-04-23 DIAGNOSIS — D6851 Activated protein C resistance: Secondary | ICD-10-CM | POA: Insufficient documentation

## 2020-04-23 DIAGNOSIS — I38 Endocarditis, valve unspecified: Secondary | ICD-10-CM | POA: Insufficient documentation

## 2020-04-23 DIAGNOSIS — K219 Gastro-esophageal reflux disease without esophagitis: Secondary | ICD-10-CM | POA: Insufficient documentation

## 2020-04-23 DIAGNOSIS — M5136 Other intervertebral disc degeneration, lumbar region: Secondary | ICD-10-CM | POA: Insufficient documentation

## 2020-04-23 DIAGNOSIS — R011 Cardiac murmur, unspecified: Secondary | ICD-10-CM | POA: Insufficient documentation

## 2020-04-23 DIAGNOSIS — T7840XA Allergy, unspecified, initial encounter: Secondary | ICD-10-CM | POA: Insufficient documentation

## 2020-04-23 DIAGNOSIS — G4730 Sleep apnea, unspecified: Secondary | ICD-10-CM | POA: Insufficient documentation

## 2020-04-23 DIAGNOSIS — M1990 Unspecified osteoarthritis, unspecified site: Secondary | ICD-10-CM | POA: Insufficient documentation

## 2020-04-23 DIAGNOSIS — E7800 Pure hypercholesterolemia, unspecified: Secondary | ICD-10-CM | POA: Insufficient documentation

## 2020-04-23 DIAGNOSIS — T8859XA Other complications of anesthesia, initial encounter: Secondary | ICD-10-CM | POA: Insufficient documentation

## 2020-04-23 NOTE — Telephone Encounter (Signed)
Called [facility]. Rx will be sent to patient address through mail order. No further concerns at this time.

## 2020-04-23 NOTE — Telephone Encounter (Signed)
Patient states that Francine Graven has contacted her and said that [facility]  has her medication on hold and Dr. Beverely Low needs  to call them - [phone]  Order #  [id number].

## 2020-04-23 NOTE — Telephone Encounter (Signed)
Soke with patient regarding concerns of Rx. [place] to verify Rx. Rx approved and will be sent to patient by mail order. No further concerns at this time.

## 2020-04-24 ENCOUNTER — Encounter: Payer: Self-pay | Admitting: Cardiology

## 2020-04-24 ENCOUNTER — Ambulatory Visit: Payer: Medicare HMO | Admitting: Cardiology

## 2020-04-24 ENCOUNTER — Other Ambulatory Visit: Payer: Self-pay

## 2020-04-24 VITALS — BP 138/92 | HR 85 | Ht 63.0 in | Wt 253.0 lb

## 2020-04-24 DIAGNOSIS — R011 Cardiac murmur, unspecified: Secondary | ICD-10-CM | POA: Diagnosis not present

## 2020-04-24 DIAGNOSIS — Z9989 Dependence on other enabling machines and devices: Secondary | ICD-10-CM | POA: Diagnosis not present

## 2020-04-24 DIAGNOSIS — Z01810 Encounter for preprocedural cardiovascular examination: Secondary | ICD-10-CM | POA: Diagnosis not present

## 2020-04-24 DIAGNOSIS — G4733 Obstructive sleep apnea (adult) (pediatric): Secondary | ICD-10-CM | POA: Diagnosis not present

## 2020-04-24 DIAGNOSIS — R0602 Shortness of breath: Secondary | ICD-10-CM | POA: Diagnosis not present

## 2020-04-24 DIAGNOSIS — D6851 Activated protein C resistance: Secondary | ICD-10-CM

## 2020-04-24 NOTE — Patient Instructions (Signed)
Medication Instructions:  Your physician recommends that you continue on your current medications as directed. Please refer to the Current Medication list given to you today.  *If you need a refill on your cardiac medications before your next appointment, please call your pharmacy*   Lab Work: None.  If you have labs (blood work) drawn today and your tests are completely normal, you will receive your results only by: . MyChart Message (if you have MyChart) OR . A paper copy in the mail If you have any lab test that is abnormal or we need to change your treatment, we will call you to review the results.   Testing/Procedures: Your physician has requested that you have an echocardiogram. Echocardiography is a painless test that uses sound waves to create images of your heart. It provides your doctor with information about the size and shape of your heart and how well your heart's chambers and valves are working. This procedure takes approximately one hour. There are no restrictions for this procedure.     Follow-Up: At [facility], you and your health needs are our priority.  As part of our continuing mission to provide you with exceptional heart care, we have created designated Provider Care Teams.  These Care Teams include your primary Cardiologist (physician) and Advanced Practice Providers (APPs -  Physician Assistants and Nurse Practitioners) who all work together to provide you with the care you need, when you need it.  We recommend signing up for the patient portal called "MyChart".  Sign up information is provided on this After Visit Summary.  MyChart is used to connect with patients for Virtual Visits (Telemedicine).  Patients are able to view lab/test results, encounter notes, upcoming appointments, etc.  Non-urgent messages can be sent to your provider as well.   To learn more about what you can do with MyChart, go to [email or web address].    Your next appointment:   3  month(s)  The format for your next appointment:   In Person  Provider:   Robert Krasowski, MD   Other Instructions   

## 2020-04-24 NOTE — Progress Notes (Signed)
Cardiology Consultation:    Date:  04/24/2020   ID:  Renee Flores, DOB 08-27-1965, MRN [id number]  PCP:  [name], MD  Cardiologist:  [name], MD   Referring MD: [name], MD   Chief Complaint  Patient presents with  . Medical Clearance    History of Present Illness:    Renee Flores is a 54 y.o. female who is being seen today for the evaluation of I need ankle surgery at the request of [name], MD.  Past medical history significant for essential hypertension we will keep her medications, obesity, obstructive sleep apnea, dyslipidemia.  She does have left ankle problem and she required surgery.  Apparently years ago she got surgery done under general anesthesia for left knee she did have some difficulty waking up after that I suspect related to sleep apnea and then she got surgery done on the right knee which was done under nerve block.  My understanding also is that that surgery that is planned to be done next week for her left ankle will be done also under nerve block.  She does have history of mitral regurgitation which is assessed as moderate in 2015.  At that time she was also complaining of having shortness of breath and stress test was done which showed no evidence of ischemia. Overall she is doing fine.  She said short of breath still there.  Her husband passed months ago and after that she gained about 30 to 40 pounds and she blamed that weight gain on her shortness of breath.  Denies having a chest pain tightness squeezing pressure burning chest does have minimal swelling of lower extremities at evening time.  She does have obstructive sleep apnea that is treated with CPAP.  She does have some issue with equipment been waiting for his replacement. Does not exercise on the regular basis, in spite of the fact that she got by with her left ankle she is able to go to [place] and do shopping walking around without any symptomatology except  pain in her left leg.  Past Medical History:  Diagnosis Date  . Allergy   . Arthritis   . Bulging lumbar disc    L4-5  . Complication of anesthesia    anxiety attack after waking up from anesthesia  . Factor 5 Leiden mutation, heterozygous ([place])   . Factor V deficiency, congenital ([place]) 03/23/2012  . GERD (gastroesophageal reflux disease)   . Heart murmur   . Heart palpitations   . Heart valve problem    mild- moderate mitral valve leakage   . High cholesterol   . HYPERCHOLESTEROLEMIA 02/27/2010   Qualifier: Diagnosis of  By: [name], Langley Gauss    . Hypersomnia with sleep apnea, unspecified 11/03/2013  . Hypertension   . Lumbar radiculopathy 08/12/2018  . MS (multiple sclerosis) ([place])    questionable  . Neuromuscular disorder ([place])   . OSA on CPAP 03/25/2017  . Osteopenia 04/2016   T score -1.9  . Osteoporosis   . Preop cardiovascular exam 03/23/2012  . Retrognathia 11/03/2013  . Right knee DJD 07/08/2012  . Severe obesity (BMI >= 40) ([place]) 11/03/2013  . Shortness of breath 06/03/2013  . Sleep apnea   . Vitamin D deficiency 2014   17    Past Surgical History:  Procedure Laterality Date  . ABDOMINAL HYSTERECTOMY    . APPENDECTOMY    . arthroscopic knee surgery    . CESAREAN SECTION  x2  . chloecystectomy    . CHOLECYSTECTOMY    . ESOPHAGEAL DILATION  04/09/2012  . KNEE LIGAMENT RECONSTRUCTION    . laser throat polyps    . NECK LESION BIOPSY    . PATELLA FRACTURE SURGERY    . PELVIC LAPAROSCOPY     LSO  . REPLACEMENT TOTAL KNEE Left 2012  . REPLACEMENT TOTAL KNEE Right 2014  . TOE SURGERY    . TONSILLECTOMY    . TONSILLECTOMY  1979  . TOTAL ABDOMINAL HYSTERECTOMY W/ BILATERAL SALPINGOOPHORECTOMY  2000  . TOTAL KNEE ARTHROPLASTY Left 2012  . TOTAL KNEE ARTHROPLASTY Right 07/08/2012   Procedure: TOTAL KNEE ARTHROPLASTY with revision components ;  Surgeon: Hessie Dibble, MD;  Location: [facility];  Service: Orthopedics;  Laterality: Right;  PATIENT HAS HX. OF FACTOR  IV DISORFER  . TUBAL LIGATION      Current Medications: Current Meds  Medication Sig  . aspirin EC 81 MG tablet Take 81 mg by mouth daily.  . Calcium Carb-Cholecalciferol (CALCIUM + D3) 600-200 MG-UNIT TABS Take 1 tablet by mouth daily.  . cloNIDine (CATAPRES) 0.1 MG tablet Take 1 tablet (0.1 mg total) by mouth 3 (three) times daily.  . fluticasone (FLONASE) 50 MCG/ACT nasal spray Place 2 sprays into the nose daily as needed for allergies.   Marland Kitchen gabapentin (NEURONTIN) 600 MG tablet Take 1 tablet (600 mg total) by mouth at bedtime.  . hydrochlorothiazide (HYDRODIURIL) 25 MG tablet Take 1 tablet (25 mg total) by mouth daily.  Marland Kitchen ketotifen (ZADITOR) 0.025 % ophthalmic solution Place 2 drops into both eyes daily as needed (for irritation).  Marland Kitchen losartan (COZAAR) 50 MG tablet Take 1 tablet (50 mg total) by mouth daily.  . meloxicam (MOBIC) 15 MG tablet Take 1 tablet (15 mg total) by mouth daily.  . metoprolol succinate (TOPROL-XL) 25 MG 24 hr tablet Take 1 tablet (25 mg total) by mouth daily.  . montelukast (SINGULAIR) 10 MG tablet Take 1 tablet (10 mg total) by mouth at bedtime.  . Multiple Vitamins-Minerals (ONE-A-DAY WOMENS 50+ ADVANTAGE PO) Take by mouth.  Marland Kitchen omeprazole (PRILOSEC) 40 MG capsule Take 1 capsule (40 mg total) by mouth daily.  . simvastatin (ZOCOR) 40 MG tablet Take 1 tablet (40 mg total) by mouth at bedtime.     Allergies:   Erythromycin base, Morphine, Vancomycin hcl, and Doxycycline   Social History   Socioeconomic History  . Marital status: Married    Spouse name: Jenny Reichmann  . Number of children: 2  . Years of education: 2  . Highest education level: Not on file  Occupational History    Employer: [employer]  Tobacco Use  . Smoking status: Never Smoker  . Smokeless tobacco: Never Used  Substance and Sexual Activity  . Alcohol use: Yes    Alcohol/week: 0.0 standard drinks    Comment: Very rare  . Drug use: No  . Sexual activity: Yes    Birth  control/protection: Surgical    Comment: HYST-1st intercourse 54 yo-Fewer than 5 partners  Other Topics Concern  . Not on file  Social History Narrative   [name]  and lives at home with her husband.   Patient has two children.   Patient works as a Mudlogger for [employer].   Patient drinks one cup of caffeine daily.   Patient is right-handed.   Patient has a high school education.   Social Determinants of Health   Financial Resource Strain: Not  on file  Food Insecurity: Not on file  Transportation Needs: Not on file  Physical Activity: Not on file  Stress: Not on file  Social Connections: Not on file     Family History: The patient's family history includes Breast cancer (age of onset: 72) in her paternal aunt; Breast cancer (age of onset: 6) in her paternal aunt; Cancer in her father; Colon cancer in her paternal aunt, paternal aunt, and paternal grandmother; Diabetes in her mother; Heart disease in her father and maternal grandfather; Heart failure in her maternal grandfather; Hyperlipidemia in her brother, father, and mother; Hypertension in her brother, brother, father, mother, and sister; Lymphoma in her maternal grandfather; Other in her father; Stroke in her paternal grandfather. ROS:   Please see the history of present illness.    All 14 point review of systems negative except as described per history of present illness.  EKGs/Labs/Other Studies Reviewed:    The following studies were reviewed today:   EKG:  EKG is  ordered today.  The ekg ordered today demonstrates normal sinus rhythm possible left atrial enlargement, left ventricle hypertrophy.  Recent Labs: 04/18/2020: ALT 28; BUN 10; Creatinine, Ser 0.81; Hemoglobin 12.2; Platelets 297.0; Potassium 4.4; Sodium 139; TSH 1.58  Recent Lipid Panel    Component Value Date/Time   CHOL 186 04/18/2020 0900   TRIG 280.0 (H) 04/18/2020 0900   HDL 56.50 04/18/2020 0900   CHOLHDL 3 04/18/2020  0900   VLDL 56.0 (H) 04/18/2020 0900   LDLDIRECT 98.0 04/18/2020 0900    Physical Exam:    VS:  BP (!) 138/92 (BP Location: Right Arm, Patient Position: Sitting)   Pulse 85   Ht 5\' 3"  (1.6 m)   Wt 253 lb (114.8 kg)   SpO2 91%   BMI 44.82 kg/m     Wt Readings from Last 3 Encounters:  04/24/20 253 lb (114.8 kg)  04/13/20 252 lb 3.2 oz (114.4 kg)  09/06/19 260 lb (117.9 kg)     GEN:  Well nourished, well developed in no acute distress HEENT: Normal NECK: No JVD; No carotid bruits LYMPHATICS: No lymphadenopathy CARDIAC: RRR, no murmurs, no rubs, no gallops RESPIRATORY:  Clear to auscultation without rales, wheezing or rhonchi  ABDOMEN: Soft, non-tender, non-distended MUSCULOSKELETAL:  No edema; No deformity  SKIN: Warm and dry NEUROLOGIC:  Alert and oriented x 3 PSYCHIATRIC:  Normal affect   ASSESSMENT:    1. Preop cardiovascular exam   2. Shortness of breath   3. Heart murmur   4. OSA on CPAP   5. Factor 5 Leiden mutation, heterozygous ([place])    PLAN:    In order of problems listed above:  1. Cardiovascular preop evaluation for left ankle surgery.  Look like surgeons cannot be done under nerve block which will treat low to very low risk from cardiac standpoint of view it is a low risk surgery with low risk anesthesia.  However, she does have history of mitral regurgitation regardless of her surgery that need to be evaluated.  I will schedule her to have echocardiogram to assess left ventricle ejection fraction more importantly look at the mitral regurgitation leak.  Interestingly on the physical exam I can barely hear 6 holosystolic murmur which is difficult to auscultate. 2. Dyspnea on exertion stable for years.  She blames it on her weight gain.  Again echocardiogram will be done. 3. Obstructive sleep apnea: Using CPAP mask but does have some difficulty with not waiting for replacement. 4. Factor V Leiden mutation  heterozygous.  That being followed by hematology team.   She does have a brother with PE. 5. Essential hypertension required multiple medications.  However it seems to be decently controlled. 6. Dyslipidemia she is on statin which I will continue.  I did review K PN which show me her LDL of 98 and HDL 56.   Medication Adjustments/Labs and Tests Ordered: Current medicines are reviewed at length with the patient today.  Concerns regarding medicines are outlined above.  Orders Placed This Encounter  Procedures  . EKG 12-Lead   No orders of the defined types were placed in this encounter.   Signed, Georgeanna Lea, MD, [facility]. 04/24/2020 11:10 AM    [facility]

## 2020-04-25 ENCOUNTER — Telehealth: Payer: Self-pay | Admitting: Cardiology

## 2020-04-25 NOTE — Telephone Encounter (Signed)
Spoke to the patient just now and she wants to know how long she will need to stop her Aspirin prior to her ankle surgery. I spoke to Dr. Bing Matter just now and he advised that she hold the Aspirin for 5 days prior to her surgery. She verbalizes understanding and thanks me for the call back.

## 2020-04-25 NOTE — Telephone Encounter (Signed)
     Pt c/o medication issue:  1. Name of Medication: ASA  2. How are you currently taking this medication (dosage and times per day)?   3. Are you having a reaction (difficulty breathing--STAT)?   4. What is your medication issue? Pt would like to make sure Dr. Kirtland Bouchard will let her know when she can hold her ASA prior her procedure

## 2020-04-26 ENCOUNTER — Ambulatory Visit (HOSPITAL_COMMUNITY)
Admission: RE | Admit: 2020-04-26 | Discharge: 2020-04-26 | Disposition: A | Discharge status: Home / Self Care | Payer: Medicare HMO | Source: Ambulatory Visit | Attending: Cardiology | Admitting: Cardiology

## 2020-04-26 ENCOUNTER — Other Ambulatory Visit: Payer: Self-pay

## 2020-04-26 DIAGNOSIS — K219 Gastro-esophageal reflux disease without esophagitis: Secondary | ICD-10-CM | POA: Diagnosis not present

## 2020-04-26 DIAGNOSIS — E785 Hyperlipidemia, unspecified: Secondary | ICD-10-CM | POA: Insufficient documentation

## 2020-04-26 DIAGNOSIS — Z01810 Encounter for preprocedural cardiovascular examination: Secondary | ICD-10-CM | POA: Diagnosis not present

## 2020-04-26 DIAGNOSIS — I10 Essential (primary) hypertension: Secondary | ICD-10-CM | POA: Insufficient documentation

## 2020-04-26 LAB — ECHOCARDIOGRAM COMPLETE
Area-P 1/2: 4.86 cm2
Calc EF: 64.7 %
S' Lateral: 3.3 cm
Single Plane A2C EF: 63 %
Single Plane A4C EF: 64.5 %

## 2020-04-26 NOTE — Progress Notes (Signed)
  Echocardiogram 2D Echocardiogram has been performed.  Burnard Hawthorne 04/26/2020, 11:37 AM

## 2020-04-30 ENCOUNTER — Telehealth: Payer: Self-pay

## 2020-04-30 NOTE — Telephone Encounter (Signed)
   [facility] Pre-operative Risk Assessment    Request for surgical clearance:  1. What type of surgery is being performed? Left Medial Displacement Calcaneal Osteotomy,Debride posterior Tibial Tendon, Spring Ligament Reconstruction, FDL Transfer, Deltoid Ligament Reconstruction, Fibular Exostectomy, Talus Exostectomy, Peroneal Tendon Debridment vs repair.    2. When is this surgery scheduled? 05/03/2020   3. What type of clearance is required (medical clearance vs. Pharmacy clearance to hold med vs. Both)? Medical   4. Are there any medications that need to be held prior to surgery and how long?None specified however she is on ASA 81   5. Practice name and name of physician performing surgery? Dr. Radene Journey   6. What is your office phone number:[phone]    7.   What is your office fax number: [phone]  8.   Anesthesia type (None, local, MAC, general) ? General   Renee Flores 04/30/2020, 2:11 PM  _________________________________________________________________   (provider comments below)

## 2020-04-30 NOTE — Telephone Encounter (Signed)
   Primary Cardiologist: Gypsy Balsam, MD  Chart reviewed as part of pre-operative protocol coverage. She was seen by Dr. Bing Matter 04/24/20 for preoperative evaluation and was recommended to undergo an echocardiogram which showed EF 60-65%, normal LV diastolic function, no RWMA, and trivial MR. Based on these findings, Renee Flores would be at acceptable risk for the planned procedure without further cardiovascular testing.   I will route this recommendation to the requesting party via Epic fax function and remove from pre-op pool.  Please call with questions.  Beatriz Stallion, PA-C 04/30/2020, 2:46 PM

## 2020-04-30 NOTE — Telephone Encounter (Signed)
   [facility] Pre-operative Risk Assessment    Request for surgical clearance:  1. What type of surgery is being performed? Left Medial Displacement Calcaneal Osteotomy, Debride Posterior Tibial Tendon, Spring Exostectomy, Peroneal Tendon Debridment Vs Repair    2. When is this surgery scheduled? 05/03/2020   3. What type of clearance is required (medical clearance vs. Pharmacy clearance to hold med vs. Both)? Medical  4. Are there any medications that need to be held prior to surgery and how long?Not specified however the patient is on ASA81   5. Practice name and name of physician performing surgery? Dr. Radene Journey   6. What is your office phone number: [phone].    7.   What is your office fax number: [phone] Att: Renee Flores  8.   Anesthesia type (None, local, MAC, general) ? General   Renee Flores 04/30/2020, 1:50 PM  _________________________________________________________________   (provider comments below)

## 2020-05-03 DIAGNOSIS — M216X1 Other acquired deformities of right foot: Secondary | ICD-10-CM | POA: Diagnosis not present

## 2020-05-03 DIAGNOSIS — G8918 Other acute postprocedural pain: Secondary | ICD-10-CM | POA: Diagnosis not present

## 2020-05-03 DIAGNOSIS — S93492A Sprain of other ligament of left ankle, initial encounter: Secondary | ICD-10-CM | POA: Diagnosis not present

## 2020-05-03 DIAGNOSIS — M25775 Osteophyte, left foot: Secondary | ICD-10-CM | POA: Diagnosis not present

## 2020-05-03 DIAGNOSIS — S93412A Sprain of calcaneofibular ligament of left ankle, initial encounter: Secondary | ICD-10-CM | POA: Diagnosis not present

## 2020-05-03 DIAGNOSIS — S86112A Strain of other muscle(s) and tendon(s) of posterior muscle group at lower leg level, left leg, initial encounter: Secondary | ICD-10-CM | POA: Diagnosis not present

## 2020-05-03 DIAGNOSIS — M898X6 Other specified disorders of bone, lower leg: Secondary | ICD-10-CM | POA: Diagnosis not present

## 2020-05-03 DIAGNOSIS — M216X2 Other acquired deformities of left foot: Secondary | ICD-10-CM | POA: Diagnosis not present

## 2020-05-03 DIAGNOSIS — M25372 Other instability, left ankle: Secondary | ICD-10-CM | POA: Diagnosis not present

## 2020-05-16 DIAGNOSIS — S86112A Strain of other muscle(s) and tendon(s) of posterior muscle group at lower leg level, left leg, initial encounter: Secondary | ICD-10-CM | POA: Diagnosis not present

## 2020-05-16 DIAGNOSIS — S93492A Sprain of other ligament of left ankle, initial encounter: Secondary | ICD-10-CM | POA: Diagnosis not present

## 2020-05-16 DIAGNOSIS — M25372 Other instability, left ankle: Secondary | ICD-10-CM | POA: Diagnosis not present

## 2020-06-13 DIAGNOSIS — S86112A Strain of other muscle(s) and tendon(s) of posterior muscle group at lower leg level, left leg, initial encounter: Secondary | ICD-10-CM | POA: Diagnosis not present

## 2020-06-13 DIAGNOSIS — M25372 Other instability, left ankle: Secondary | ICD-10-CM | POA: Diagnosis not present

## 2020-06-13 DIAGNOSIS — S93492A Sprain of other ligament of left ankle, initial encounter: Secondary | ICD-10-CM | POA: Diagnosis not present

## 2020-06-14 ENCOUNTER — Other Ambulatory Visit: Payer: Self-pay

## 2020-06-14 ENCOUNTER — Encounter: Payer: Self-pay | Admitting: Family Medicine

## 2020-06-14 ENCOUNTER — Ambulatory Visit (INDEPENDENT_AMBULATORY_CARE_PROVIDER_SITE_OTHER): Payer: Medicare HMO | Admitting: Family Medicine

## 2020-06-14 DIAGNOSIS — F40240 Claustrophobia: Secondary | ICD-10-CM | POA: Diagnosis not present

## 2020-06-14 DIAGNOSIS — I10 Essential (primary) hypertension: Secondary | ICD-10-CM

## 2020-06-14 NOTE — Patient Instructions (Addendum)
Schedule your complete physical in 3 months No need for blood work today- yay!!! No medication changes at this time, but we'll continue to monitor Once cleared by PT, start your regular exercise Try and work on healthy diet to help w/ weight loss Call with any questions or concerns Stay Safe!  Stay Healthy!

## 2020-06-14 NOTE — Progress Notes (Signed)
   Subjective:    Patient ID: Renee Flores, female    DOB: 1966/02/28, 55 y.o.   MRN: [id number]  HPI HTN- chronic problem, on Clonidine 0.1mg  TID, HCTZ 25mg  daily, Losartan 50mg  daily, and Metoprolol XL 25mg  daily w/ excellent control.  Denies CP, SOB, HAs, visual changes, edema.  Morbid obesity- BMI 44.82  Recently had L ankle surgery and has been non-weightbearing.  Plan is to start PT next week.  Pt reports she was 220 lbs when husband passed 2 yrs ago.  Has gained 35 lbs since then.  Anxiety- pt reports that recently her claustrophobia is worsening.  Had difficulty even w/ open MRI.  Had a panic attack after surgery b/c she couldn't move her foot the way she thought she should.  Had a panic attack in the doctor's office when they put her in a cast b/c she felt her foot was too confined.   Review of Systems For ROS see HPI   This visit occurred during the SARS-CoV-2 public health emergency.  Safety protocols were in place, including screening questions prior to the visit, additional usage of staff PPE, and extensive cleaning of exam room while observing appropriate contact time as indicated for disinfecting solutions.       Objective:   Physical Exam Vitals reviewed.  Constitutional:      General: She is not in acute distress.    Appearance: Normal appearance. She is well-developed and well-nourished. She is obese.  HENT:     Head: Normocephalic and atraumatic.  Eyes:     Extraocular Movements: EOM normal.     Conjunctiva/sclera: Conjunctivae normal.     Pupils: Pupils are equal, round, and reactive to light.  Neck:     Thyroid: No thyromegaly.  Cardiovascular:     Rate and Rhythm: Normal rate and regular rhythm.     Pulses: Normal pulses and intact distal pulses.     Heart sounds: Normal heart sounds. No murmur heard.   Pulmonary:     Effort: Pulmonary effort is normal. No respiratory distress.     Breath sounds: Normal breath sounds.  Abdominal:     General: There  is no distension.     Palpations: Abdomen is soft.     Tenderness: There is no abdominal tenderness.  Musculoskeletal:        General: No edema.     Cervical back: Normal range of motion and neck supple.     Right lower leg: No edema.  Lymphadenopathy:     Cervical: No cervical adenopathy.  Skin:    General: Skin is warm and dry.  Neurological:     Mental Status: She is alert and oriented to person, place, and time.  Psychiatric:        Mood and Affect: Mood and affect normal.        Behavior: Behavior normal.           Assessment & Plan:  Claustrophobia- new to provider, ongoing for pt. This seems to be situational and either involves confined spaces and when she believes she cannot move freely (such as in a cast).  At this time, she does not require daily medication for this but if sxs continue once her cast has been removed and her mobility restrictions are lifted, we can talk about next steps.  Pt expressed understanding and is in agreement w/ plan.

## 2020-06-21 DIAGNOSIS — S86112D Strain of other muscle(s) and tendon(s) of posterior muscle group at lower leg level, left leg, subsequent encounter: Secondary | ICD-10-CM | POA: Diagnosis not present

## 2020-06-21 DIAGNOSIS — M6281 Muscle weakness (generalized): Secondary | ICD-10-CM | POA: Diagnosis not present

## 2020-06-21 DIAGNOSIS — M25672 Stiffness of left ankle, not elsewhere classified: Secondary | ICD-10-CM | POA: Diagnosis not present

## 2020-06-21 DIAGNOSIS — M25372 Other instability, left ankle: Secondary | ICD-10-CM | POA: Diagnosis not present

## 2020-06-26 ENCOUNTER — Other Ambulatory Visit: Payer: Self-pay | Admitting: Family Medicine

## 2020-06-26 DIAGNOSIS — T7840XA Allergy, unspecified, initial encounter: Secondary | ICD-10-CM

## 2020-06-26 DIAGNOSIS — I340 Nonrheumatic mitral (valve) insufficiency: Secondary | ICD-10-CM

## 2020-06-26 DIAGNOSIS — M5416 Radiculopathy, lumbar region: Secondary | ICD-10-CM

## 2020-06-26 DIAGNOSIS — I10 Essential (primary) hypertension: Secondary | ICD-10-CM

## 2020-06-26 DIAGNOSIS — M25372 Other instability, left ankle: Secondary | ICD-10-CM | POA: Diagnosis not present

## 2020-06-26 DIAGNOSIS — M25672 Stiffness of left ankle, not elsewhere classified: Secondary | ICD-10-CM | POA: Diagnosis not present

## 2020-06-26 DIAGNOSIS — M6281 Muscle weakness (generalized): Secondary | ICD-10-CM | POA: Diagnosis not present

## 2020-06-26 DIAGNOSIS — S86112D Strain of other muscle(s) and tendon(s) of posterior muscle group at lower leg level, left leg, subsequent encounter: Secondary | ICD-10-CM | POA: Diagnosis not present

## 2020-06-26 MED ORDER — MELOXICAM 15 MG PO TABS: 15.0000 mg | ORAL_TABLET | Freq: Every day | ORAL | 0 refills | Status: DC

## 2020-06-26 MED ORDER — MONTELUKAST SODIUM 10 MG PO TABS: 10.0000 mg | ORAL_TABLET | Freq: Every day | ORAL | 0 refills | Status: DC

## 2020-06-26 MED ORDER — CLONIDINE HCL 0.1 MG PO TABS: 0.1000 mg | ORAL_TABLET | Freq: Three times a day (TID) | ORAL | 0 refills | Status: DC

## 2020-06-26 MED ORDER — LOSARTAN POTASSIUM 50 MG PO TABS: 50.0000 mg | ORAL_TABLET | Freq: Every day | ORAL | 0 refills | Status: DC

## 2020-06-29 DIAGNOSIS — M6281 Muscle weakness (generalized): Secondary | ICD-10-CM | POA: Diagnosis not present

## 2020-06-29 DIAGNOSIS — S86112D Strain of other muscle(s) and tendon(s) of posterior muscle group at lower leg level, left leg, subsequent encounter: Secondary | ICD-10-CM | POA: Diagnosis not present

## 2020-06-29 DIAGNOSIS — M25372 Other instability, left ankle: Secondary | ICD-10-CM | POA: Diagnosis not present

## 2020-06-29 DIAGNOSIS — M25672 Stiffness of left ankle, not elsewhere classified: Secondary | ICD-10-CM | POA: Diagnosis not present

## 2020-07-03 DIAGNOSIS — S86112D Strain of other muscle(s) and tendon(s) of posterior muscle group at lower leg level, left leg, subsequent encounter: Secondary | ICD-10-CM | POA: Diagnosis not present

## 2020-07-03 DIAGNOSIS — M25672 Stiffness of left ankle, not elsewhere classified: Secondary | ICD-10-CM | POA: Diagnosis not present

## 2020-07-03 DIAGNOSIS — M6281 Muscle weakness (generalized): Secondary | ICD-10-CM | POA: Diagnosis not present

## 2020-07-03 DIAGNOSIS — M25372 Other instability, left ankle: Secondary | ICD-10-CM | POA: Diagnosis not present

## 2020-07-04 ENCOUNTER — Other Ambulatory Visit: Payer: Self-pay

## 2020-07-04 ENCOUNTER — Telehealth: Payer: Self-pay | Admitting: Family Medicine

## 2020-07-04 DIAGNOSIS — I10 Essential (primary) hypertension: Secondary | ICD-10-CM

## 2020-07-04 MED ORDER — CLONIDINE HCL 0.1 MG PO TABS: 0.1000 mg | ORAL_TABLET | Freq: Three times a day (TID) | ORAL | 0 refills | Status: DC

## 2020-07-04 NOTE — Telephone Encounter (Signed)
Rx sent in patient notified 

## 2020-07-04 NOTE — Telephone Encounter (Signed)
Pt called in stating that [facility] can't track her medication. They told her to call her provider to see if we can send in a 30 day supply on the Clonidine to the [facility] in [place]. Pt states that she is out of the medication.  Please advise

## 2020-07-06 DIAGNOSIS — M25672 Stiffness of left ankle, not elsewhere classified: Secondary | ICD-10-CM | POA: Diagnosis not present

## 2020-07-06 DIAGNOSIS — M25372 Other instability, left ankle: Secondary | ICD-10-CM | POA: Diagnosis not present

## 2020-07-06 DIAGNOSIS — M6281 Muscle weakness (generalized): Secondary | ICD-10-CM | POA: Diagnosis not present

## 2020-07-06 DIAGNOSIS — S86112D Strain of other muscle(s) and tendon(s) of posterior muscle group at lower leg level, left leg, subsequent encounter: Secondary | ICD-10-CM | POA: Diagnosis not present

## 2020-07-10 DIAGNOSIS — M6281 Muscle weakness (generalized): Secondary | ICD-10-CM | POA: Diagnosis not present

## 2020-07-10 DIAGNOSIS — M25672 Stiffness of left ankle, not elsewhere classified: Secondary | ICD-10-CM | POA: Diagnosis not present

## 2020-07-10 DIAGNOSIS — S86112D Strain of other muscle(s) and tendon(s) of posterior muscle group at lower leg level, left leg, subsequent encounter: Secondary | ICD-10-CM | POA: Diagnosis not present

## 2020-07-10 DIAGNOSIS — M25372 Other instability, left ankle: Secondary | ICD-10-CM | POA: Diagnosis not present

## 2020-07-12 DIAGNOSIS — S86112D Strain of other muscle(s) and tendon(s) of posterior muscle group at lower leg level, left leg, subsequent encounter: Secondary | ICD-10-CM | POA: Diagnosis not present

## 2020-07-12 DIAGNOSIS — M25672 Stiffness of left ankle, not elsewhere classified: Secondary | ICD-10-CM | POA: Diagnosis not present

## 2020-07-12 DIAGNOSIS — M25372 Other instability, left ankle: Secondary | ICD-10-CM | POA: Diagnosis not present

## 2020-07-12 DIAGNOSIS — M6281 Muscle weakness (generalized): Secondary | ICD-10-CM | POA: Diagnosis not present

## 2020-07-17 DIAGNOSIS — M25372 Other instability, left ankle: Secondary | ICD-10-CM | POA: Diagnosis not present

## 2020-07-17 DIAGNOSIS — S86112D Strain of other muscle(s) and tendon(s) of posterior muscle group at lower leg level, left leg, subsequent encounter: Secondary | ICD-10-CM | POA: Diagnosis not present

## 2020-07-17 DIAGNOSIS — M6281 Muscle weakness (generalized): Secondary | ICD-10-CM | POA: Diagnosis not present

## 2020-07-17 DIAGNOSIS — M25672 Stiffness of left ankle, not elsewhere classified: Secondary | ICD-10-CM | POA: Diagnosis not present

## 2020-07-21 ENCOUNTER — Other Ambulatory Visit: Payer: Self-pay | Admitting: Family Medicine

## 2020-07-21 DIAGNOSIS — I10 Essential (primary) hypertension: Secondary | ICD-10-CM

## 2020-07-21 DIAGNOSIS — E785 Hyperlipidemia, unspecified: Secondary | ICD-10-CM

## 2020-07-21 DIAGNOSIS — E1169 Type 2 diabetes mellitus with other specified complication: Secondary | ICD-10-CM

## 2020-07-24 ENCOUNTER — Ambulatory Visit: Payer: Medicare HMO | Admitting: Cardiology

## 2020-07-25 DIAGNOSIS — M25672 Stiffness of left ankle, not elsewhere classified: Secondary | ICD-10-CM | POA: Diagnosis not present

## 2020-07-25 DIAGNOSIS — M6281 Muscle weakness (generalized): Secondary | ICD-10-CM | POA: Diagnosis not present

## 2020-07-25 DIAGNOSIS — M25372 Other instability, left ankle: Secondary | ICD-10-CM | POA: Diagnosis not present

## 2020-07-25 DIAGNOSIS — S86112D Strain of other muscle(s) and tendon(s) of posterior muscle group at lower leg level, left leg, subsequent encounter: Secondary | ICD-10-CM | POA: Diagnosis not present

## 2020-07-30 ENCOUNTER — Other Ambulatory Visit: Payer: Self-pay

## 2020-07-30 ENCOUNTER — Ambulatory Visit (INDEPENDENT_AMBULATORY_CARE_PROVIDER_SITE_OTHER): Payer: Medicare HMO

## 2020-07-30 VITALS — BP 124/86 | HR 64 | Temp 98.2°F | Resp 16 | Ht 63.0 in | Wt 265.0 lb

## 2020-07-30 DIAGNOSIS — Z0000 Encounter for general adult medical examination without abnormal findings: Secondary | ICD-10-CM

## 2020-07-30 NOTE — Progress Notes (Signed)
Subjective:   Renee Flores is a 55 y.o. female who presents for Medicare Annual (Subsequent) preventive examination.  Review of Systems     Cardiac Risk Factors include: hypertension;dyslipidemia;obesity (BMI >30kg/m2)     Objective:    Today's Vitals   07/30/20 0901  BP: 124/86  Pulse: 64  Resp: 16  Temp: 98.2 F (36.8 C)  TempSrc: Temporal  SpO2: 94%  Weight: 265 lb (120.2 kg)  Height: 5\' 3"  (1.6 m)   Body mass index is 46.94 kg/m.  Advanced Directives 07/30/2020 07/01/2012  Does Patient Have a Medical Advance Directive? No Patient does not have advance directive;Patient would not like information  Does patient want to make changes to medical advance directive? Yes (MAU/Ambulatory/Procedural Areas - Information given) -    Current Medications (verified) Outpatient Encounter Medications as of 07/30/2020  Medication Sig  . aspirin EC 81 MG tablet Take 81 mg by mouth daily.  . Calcium Carb-Cholecalciferol (CALCIUM + D3) 600-200 MG-UNIT TABS Take 1 tablet by mouth daily.  . cloNIDine (CATAPRES) 0.1 MG tablet TAKE 1 TABLET THREE TIMES DAILY  . fluticasone (FLONASE) 50 MCG/ACT nasal spray Place 2 sprays into the nose daily as needed for allergies.   Marland Kitchen gabapentin (NEURONTIN) 600 MG tablet Take 1 tablet (600 mg total) by mouth at bedtime.  . hydrochlorothiazide (HYDRODIURIL) 25 MG tablet Take 1 tablet (25 mg total) by mouth daily.  Marland Kitchen ketotifen (ZADITOR) 0.025 % ophthalmic solution Place 2 drops into both eyes daily as needed (for irritation).  Marland Kitchen losartan (COZAAR) 50 MG tablet Take 1 tablet (50 mg total) by mouth daily.  . meloxicam (MOBIC) 15 MG tablet Take 1 tablet (15 mg total) by mouth daily.  . metoprolol succinate (TOPROL-XL) 25 MG 24 hr tablet Take 1 tablet (25 mg total) by mouth daily.  . montelukast (SINGULAIR) 10 MG tablet Take 1 tablet (10 mg total) by mouth at bedtime.  . Multiple Vitamins-Minerals (ONE-A-DAY WOMENS 50+ ADVANTAGE PO) Take by mouth.  Marland Kitchen omeprazole  (PRILOSEC) 40 MG capsule Take 1 capsule (40 mg total) by mouth daily.  . simvastatin (ZOCOR) 40 MG tablet TAKE 1 TABLET AT BEDTIME   No facility-administered encounter medications on file as of 07/30/2020.    Allergies (verified) Erythromycin base, Morphine, Vancomycin hcl, and Doxycycline   History: Past Medical History:  Diagnosis Date  . Allergy   . Arthritis   . Bulging lumbar disc    L4-5  . Complication of anesthesia    anxiety attack after waking up from anesthesia  . Factor 5 Leiden mutation, heterozygous ([place])   . Factor V deficiency, congenital ([place]) 03/23/2012  . GERD (gastroesophageal reflux disease)   . Heart murmur   . Heart palpitations   . Heart valve problem    mild- moderate mitral valve leakage   . High cholesterol   . HYPERCHOLESTEROLEMIA 02/27/2010   Qualifier: Diagnosis of  By: [name], Langley Gauss    . Hypersomnia with sleep apnea, unspecified 11/03/2013  . Hypertension   . Lumbar radiculopathy 08/12/2018  . MS (multiple sclerosis) ([place])    questionable  . Neuromuscular disorder ([place])   . OSA on CPAP 03/25/2017  . Osteopenia 04/2016   T score -1.9  . Osteoporosis   . Preop cardiovascular exam 03/23/2012  . Retrognathia 11/03/2013  . Right knee DJD 07/08/2012  . Severe obesity (BMI >= 40) ([place]) 11/03/2013  . Shortness of breath 06/03/2013  . Sleep apnea   . Vitamin D deficiency 2014   17  Past Surgical History:  Procedure Laterality Date  . ABDOMINAL HYSTERECTOMY    . APPENDECTOMY    . arthroscopic knee surgery    . CESAREAN SECTION     x2  . chloecystectomy    . CHOLECYSTECTOMY    . ESOPHAGEAL DILATION  04/09/2012  . KNEE LIGAMENT RECONSTRUCTION    . laser throat polyps    . NECK LESION BIOPSY    . PATELLA FRACTURE SURGERY    . PELVIC LAPAROSCOPY     LSO  . REPLACEMENT TOTAL KNEE Left 2012  . REPLACEMENT TOTAL KNEE Right 2014  . TOE SURGERY    . TONSILLECTOMY    . TONSILLECTOMY  1979  . TOTAL ABDOMINAL HYSTERECTOMY W/ BILATERAL  SALPINGOOPHORECTOMY  2000  . TOTAL KNEE ARTHROPLASTY Left 2012  . TOTAL KNEE ARTHROPLASTY Right 07/08/2012   Procedure: TOTAL KNEE ARTHROPLASTY with revision components ;  Surgeon: Hessie Dibble, MD;  Location: [facility];  Service: Orthopedics;  Laterality: Right;  PATIENT HAS HX. OF FACTOR IV DISORFER  . TUBAL LIGATION     Family History  Problem Relation Age of Onset  . Heart disease Father   . Hypertension Father   . Hyperlipidemia Father   . Cancer Father   . Other Father        Respiratory problems/Factor 5 disorder/1 bypass, 2 valve replacement (mitral and aortic)   . Hypertension Mother   . Diabetes Mother   . Hyperlipidemia Mother   . Hypertension Sister   . Colon cancer Paternal Aunt   . Breast cancer Paternal Aunt 11  . Heart disease Maternal Grandfather   . Lymphoma Maternal Grandfather   . Heart failure Maternal Grandfather   . Colon cancer Paternal Grandmother   . Hyperlipidemia Brother   . Hypertension Brother   . Hypertension Brother   . Colon cancer Paternal Aunt   . Breast cancer Paternal Aunt 88  . Stroke Paternal Grandfather    Social History   Socioeconomic History  . Marital status: Widowed    Spouse name: Jenny Reichmann  . Number of children: 2  . Years of education: 52  . Highest education level: Not on file  Occupational History    Employer: [employer]  Tobacco Use  . Smoking status: Never Smoker  . Smokeless tobacco: Never Used  Substance and Sexual Activity  . Alcohol use: Not Currently    Alcohol/week: 0.0 standard drinks  . Drug use: No  . Sexual activity: Yes    Birth control/protection: Surgical    Comment: HYST-1st intercourse 55 yo-Fewer than 5 partners  Other Topics Concern  . Not on file  Social History Narrative   Patient is married (Renee Flores)  and lives at home with her husband.   Patient has two children.   Patient works as a Mudlogger for [employer].   Patient drinks one cup of caffeine daily.   Patient is  right-handed.   Patient has a high school education.   Social Determinants of Health   Financial Resource Strain: Low Risk   . Difficulty of Paying Living Expenses: Not hard at all  Food Insecurity: No Food Insecurity  . Worried About Charity fundraiser in the Last Year: Never true  . Ran Out of Food in the Last Year: Never true  Transportation Needs: No Transportation Needs  . Lack of Transportation (Medical): No  . Lack of Transportation (Non-Medical): No  Physical Activity: Insufficiently Active  . Days of Exercise per Week: 7 days  .  Minutes of Exercise per Session: 10 min  Stress: No Stress Concern Present  . Feeling of Stress : Not at all  Social Connections: Moderately Integrated  . Frequency of Communication with Friends and Family: More than three times a week  . Frequency of Social Gatherings with Friends and Family: More than three times a week  . Attends Religious Services: More than 4 times per year  . Active Member of Clubs or Organizations: Yes  . Attends Archivist Meetings: 1 to 4 times per year  . Marital Status: Widowed    Tobacco Counseling Counseling given: Not Answered   Clinical Intake:  Pre-visit preparation completed: Yes  Pain : No/denies pain     Nutritional Status: BMI > 30  Obese Nutritional Risks: None Diabetes: No  How often do you need to have someone help you when you read instructions, pamphlets, or other written materials from your doctor or pharmacy?: 1 - Never  Diabetic?No  Interpreter Needed?: No  Information entered by :: Caroleen Hamman LPN   Activities of Daily Living In your present state of health, do you have any difficulty performing the following activities: 07/30/2020 04/13/2020  Hearing? N N  Vision? N N  Difficulty concentrating or making decisions? N N  Walking or climbing stairs? N N  Dressing or bathing? N N  Doing errands, shopping? N N  Preparing Food and eating ? N -  Using the Toilet? N -  In  the past six months, have you accidently leaked urine? N -  Do you have problems with loss of bowel control? N -  Managing your Medications? N -  Managing your Finances? N -  Housekeeping or managing your Housekeeping? N -  Some recent data might be hidden    Patient Care Team: [name], MD as PCP - General (Family Medicine) Park Liter, MD as PCP - Cardiology (Cardiology)  Indicate any recent Medical Services you may have received from other than [facility] providers in the past year (date may be approximate).     Assessment:   This is a routine wellness examination for Jaydalee.  Hearing/Vision screen  Hearing Screening   125Hz  250Hz  500Hz  1000Hz  2000Hz  3000Hz  4000Hz  6000Hz  8000Hz   Right ear:           Left ear:           Comments: No issues  Vision Screening Comments: Last eye exam-2-3 years ago Wears reading glasses  Dietary issues and exercise activities discussed: Current Exercise Habits: Structured exercise class (physical therapy), Type of exercise: strength training/weights;stretching, Time (Minutes): 20, Frequency (Times/Week): 7, Weekly Exercise (Minutes/Week): 140, Intensity: Mild, Exercise limited by: orthopedic condition(s) (post ankle surgery)  Goals    . Patient Stated     Lose some weight & increase activity      Depression Screen PHQ 2/9 Scores 07/30/2020 04/13/2020  PHQ - 2 Score 0 0  PHQ- 9 Score - 0    Fall Risk Fall Risk  07/30/2020 04/13/2020 04/13/2020  Falls in the past year? 1 1 0  Number falls in past yr: 1 1 0  Injury with Fall? 0 0 0  Risk for fall due to : History of fall(s) No Fall Risks No Fall Risks  Follow up Falls prevention discussed - -    FALL RISK PREVENTION PERTAINING TO THE HOME:  Any stairs in or around the home? Yes  If so, are there any without handrails? No  Home free of loose throw rugs  in walkways, pet beds, electrical cords, etc? Yes  Adequate lighting in your home to reduce risk of falls? Yes   ASSISTIVE  DEVICES UTILIZED TO PREVENT FALLS:  Life alert? No  Use of a cane, walker or w/c? No  Grab bars in the bathroom? Yes  Shower chair or bench in shower? Yes  Elevated toilet seat or a handicapped toilet? No   TIMED UP AND GO:  Was the test performed? Yes .  Length of time to ambulate 10 feet: 11 sec.   Gait slow and steady without use of assistive device  Cognitive Function:Normal cognitive status assessed by direct observation by this Nurse Health Advisor. No abnormalities found.          Immunizations Immunization History  Administered Date(s) Administered  . Moderna Sars-Covid-2 Vaccination 08/05/2019, 09/01/2019, 04/05/2020  . Tdap 04/28/2006    TDAP status: Due, Education has been provided regarding the importance of this vaccine. Advised may receive this vaccine at local pharmacy or Health Dept. Aware to provide a copy of the vaccination record if obtained from local pharmacy or Health Dept. Verbalized acceptance and understanding.  Flu Vaccine status: Declined, Education has been provided regarding the importance of this vaccine but patient still declined. Advised may receive this vaccine at local pharmacy or Health Dept. Aware to provide a copy of the vaccination record if obtained from local pharmacy or Health Dept. Verbalized acceptance and understanding.  Pneumococcal vaccine status:Not yet indicated  Covid-19 vaccine status: Completed vaccines  Qualifies for Shingles Vaccine? Yes   Zostavax completed No   Shingrix Completed?: No.    Education has been provided regarding the importance of this vaccine. Patient has been advised to call insurance company to determine out of pocket expense if they have not yet received this vaccine. Advised may also receive vaccine at local pharmacy or Health Dept. Verbalized acceptance and understanding.  Screening Tests Health Maintenance  Topic Date Due  . PAP SMEAR-Modifier  07/30/2020 (Originally 02/01/2019)  . TETANUS/TDAP   04/13/2021 (Originally 04/28/2016)  . Hepatitis C Screening  04/13/2021 (Originally 10/28/1966)  . HIV Screening  04/13/2021 (Originally 11/09/1981)  . INFLUENZA VACCINE  11/26/2020  . MAMMOGRAM  05/02/2021  . COLONOSCOPY (Pts 45-43yrs Insurance coverage will need to be confirmed)  08/04/2029  . COVID-19 Vaccine  Completed  . HPV VACCINES  Aged Out    Health Maintenance  There are no preventive care reminders to display for this patient.  Colorectal cancer screening: Type of screening: Colonoscopy. Completed 08/05/2019. Repeat every 10 years  Mammogram status:Due-Declined today  Bone Density status: Not yet indicated  Lung Cancer Screening: (Low Dose CT Chest recommended if Age 11-80 years, 30 pack-year currently smoking OR have quit w/in 15years.) does not qualify.    Additional Screening:  Hepatitis C Screening: does not qualify  Vision Screening: Recommended annual ophthalmology exams for early detection of glaucoma and other disorders of the eye. Is the patient up to date with their annual eye exam?  No  Who is the provider or what is the name of the office in which the patient attends annual eye exams? Unsure of name If pt is not established with a provider, would they like to be referred to a provider to establish care? No .   Dental Screening: Recommended annual dental exams for proper oral hygiene  Community Resource Referral / Chronic Care Management: CRR required this visit?  No   CCM required this visit?  No      Plan:  I have personally reviewed and noted the following in the patient's chart:   . Medical and social history . Use of alcohol, tobacco or illicit drugs  . Current medications and supplements . Functional ability and status . Nutritional status . Physical activity . Advanced directives . List of other physicians . Hospitalizations, surgeries, and ER visits in previous 12 months . Vitals . Screenings to include cognitive, depression, and  falls . Referrals and appointments  In addition, I have reviewed and discussed with patient certain preventive protocols, quality metrics, and best practice recommendations. A written personalized care plan for preventive services as well as general preventive health recommendations were provided to patient.   Patient to access avs on mychart.  Marta Antu, LPN   12/02/2822  Nurse Health Advisor  Nurse Notes: None

## 2020-07-30 NOTE — Patient Instructions (Signed)
Renee Flores , Thank you for taking time to come for your Medicare Wellness Visit. I appreciate your ongoing commitment to your health goals. Please review the following plan we discussed and let me know if I can assist you in the future.   Screening recommendations/referrals: Colonoscopy: Completed 08/05/2019-Due 08/04/2020 Mammogram: Due- Declined today-Please call the office when you are ready to schedule. Bone Density: Not yet indicated-Due at age 55 Recommended yearly ophthalmology/optometry visit for glaucoma screening and checkup Recommended yearly dental visit for hygiene and checkup  Vaccinations: Influenza vaccine: Declined Pneumococcal vaccine: Not yet indicated Tdap vaccine: Discuss with pharmacy Shingles vaccine: Discuss with pharmacy Covid-19: Completed vacccines  Advanced directives: Information given today  Conditions/risks identified: See problem list  Next appointment: Follow up in one year for your annual wellness visit.   Preventive Care 40-64 Years, Female Preventive care refers to lifestyle choices and visits with your health care provider that can promote health and wellness. What does preventive care include?  A yearly physical exam. This is also called an annual well check.  Dental exams once or twice a year.  Routine eye exams. Ask your health care provider how often you should have your eyes checked.  Personal lifestyle choices, including:  Daily care of your teeth and gums.  Regular physical activity.  Eating a healthy diet.  Avoiding tobacco and drug use.  Limiting alcohol use.  Practicing safe sex.  Taking low-dose aspirin daily starting at age 89.  Taking vitamin and mineral supplements as recommended by your health care provider. What happens during an annual well check? The services and screenings done by your health care provider during your annual well check will depend on your age, overall health, lifestyle risk factors, and family  history of disease. Counseling  Your health care provider may ask you questions about your:  Alcohol use.  Tobacco use.  Drug use.  Emotional well-being.  Home and relationship well-being.  Sexual activity.  Eating habits.  Work and work Statistician.  Method of birth control.  Menstrual cycle.  Pregnancy history. Screening  You may have the following tests or measurements:  Height, weight, and BMI.  Blood pressure.  Lipid and cholesterol levels. These may be checked every 5 years, or more frequently if you are over 21 years old.  Skin check.  Lung cancer screening. You may have this screening every year starting at age 73 if you have a 30-pack-year history of smoking and currently smoke or have quit within the past 15 years.  Fecal occult blood test (FOBT) of the stool. You may have this test every year starting at age 42.  Flexible sigmoidoscopy or colonoscopy. You may have a sigmoidoscopy every 5 years or a colonoscopy every 10 years starting at age 4.  Hepatitis C blood test.  Hepatitis B blood test.  Sexually transmitted disease (STD) testing.  Diabetes screening. This is done by checking your blood sugar (glucose) after you have not eaten for a while (fasting). You may have this done every 1-3 years.  Mammogram. This may be done every 1-2 years. Talk to your health care provider about when you should start having regular mammograms. This may depend on whether you have a family history of breast cancer.  BRCA-related cancer screening. This may be done if you have a family history of breast, ovarian, tubal, or peritoneal cancers.  Pelvic exam and Pap test. This may be done every 3 years starting at age 54. Starting at age 30, this may be done  every 5 years if you have a Pap test in combination with an HPV test.  Bone density scan. This is done to screen for osteoporosis. You may have this scan if you are at high risk for osteoporosis. Discuss your test  results, treatment options, and if necessary, the need for more tests with your health care provider. Vaccines  Your health care provider may recommend certain vaccines, such as:  Influenza vaccine. This is recommended every year.  Tetanus, diphtheria, and acellular pertussis (Tdap, Td) vaccine. You may need a Td booster every 10 years.  Zoster vaccine. You may need this after age 37.  Pneumococcal 13-valent conjugate (PCV13) vaccine. You may need this if you have certain conditions and were not previously vaccinated.  Pneumococcal polysaccharide (PPSV23) vaccine. You may need one or two doses if you smoke cigarettes or if you have certain conditions. Talk to your health care provider about which screenings and vaccines you need and how often you need them. This information is not intended to replace advice given to you by your health care provider. Make sure you discuss any questions you have with your health care provider. Document Released: 05/11/2015 Document Revised: 01/02/2016 Document Reviewed: 02/13/2015 Elsevier Interactive Patient Education  2017 [place] Prevention in the Home Falls can cause injuries. They can happen to people of all ages. There are many things you can do to make your home safe and to help prevent falls. What can I do on the outside of my home?  Regularly fix the edges of walkways and driveways and fix any cracks.  Remove anything that might make you trip as you walk through a door, such as a raised step or threshold.  Trim any bushes or trees on the path to your home.  Use bright outdoor lighting.  Clear any walking paths of anything that might make someone trip, such as rocks or tools.  Regularly check to see if handrails are loose or broken. Make sure that both sides of any steps have handrails.  Any raised decks and porches should have guardrails on the edges.  Have any leaves, snow, or ice cleared regularly.  Use sand or salt on  walking paths during winter.  Clean up any spills in your garage right away. This includes oil or grease spills. What can I do in the bathroom?  Use night lights.  Install grab bars by the toilet and in the tub and shower. Do not use towel bars as grab bars.  Use non-skid mats or decals in the tub or shower.  If you need to sit down in the shower, use a plastic, non-slip stool.  Keep the floor dry. Clean up any water that spills on the floor as soon as it happens.  Remove soap buildup in the tub or shower regularly.  Attach bath mats securely with double-sided non-slip rug tape.  Do not have throw rugs and other things on the floor that can make you trip. What can I do in the bedroom?  Use night lights.  Make sure that you have a light by your bed that is easy to reach.  Do not use any sheets or blankets that are too big for your bed. They should not hang down onto the floor.  Have a firm chair that has side arms. You can use this for support while you get dressed.  Do not have throw rugs and other things on the floor that can make you trip. What can  I do in the kitchen?  Clean up any spills right away.  Avoid walking on wet floors.  Keep items that you use a lot in easy-to-reach places.  If you need to reach something above you, use a strong step stool that has a grab bar.  Keep electrical cords out of the way.  Do not use floor polish or wax that makes floors slippery. If you must use wax, use non-skid floor wax.  Do not have throw rugs and other things on the floor that can make you trip. What can I do with my stairs?  Do not leave any items on the stairs.  Make sure that there are handrails on both sides of the stairs and use them. Fix handrails that are broken or loose. Make sure that handrails are as long as the stairways.  Check any carpeting to make sure that it is firmly attached to the stairs. Fix any carpet that is loose or worn.  Avoid having throw  rugs at the top or bottom of the stairs. If you do have throw rugs, attach them to the floor with carpet tape.  Make sure that you have a light switch at the top of the stairs and the bottom of the stairs. If you do not have them, ask someone to add them for you. What else can I do to help prevent falls?  Wear shoes that:  Do not have high heels.  Have rubber bottoms.  Are comfortable and fit you well.  Are closed at the toe. Do not wear sandals.  If you use a stepladder:  Make sure that it is fully opened. Do not climb a closed stepladder.  Make sure that both sides of the stepladder are locked into place.  Ask someone to hold it for you, if possible.  Clearly mark and make sure that you can see:  Any grab bars or handrails.  First and last steps.  Where the edge of each step is.  Use tools that help you move around (mobility aids) if they are needed. These include:  Canes.  Walkers.  Scooters.  Crutches.  Turn on the lights when you go into a dark area. Replace any light bulbs as soon as they burn out.  Set up your furniture so you have a clear path. Avoid moving your furniture around.  If any of your floors are uneven, fix them.  If there are any pets around you, be aware of where they are.  Review your medicines with your doctor. Some medicines can make you feel dizzy. This can increase your chance of falling. Ask your doctor what other things that you can do to help prevent falls. This information is not intended to replace advice given to you by your health care provider. Make sure you discuss any questions you have with your health care provider. Document Released: 02/08/2009 Document Revised: 09/20/2015 Document Reviewed: 05/19/2014 Elsevier Interactive Patient Education  2017 [employer].

## 2020-07-31 DIAGNOSIS — S86112D Strain of other muscle(s) and tendon(s) of posterior muscle group at lower leg level, left leg, subsequent encounter: Secondary | ICD-10-CM | POA: Diagnosis not present

## 2020-07-31 DIAGNOSIS — M6281 Muscle weakness (generalized): Secondary | ICD-10-CM | POA: Diagnosis not present

## 2020-07-31 DIAGNOSIS — M25372 Other instability, left ankle: Secondary | ICD-10-CM | POA: Diagnosis not present

## 2020-07-31 DIAGNOSIS — M25672 Stiffness of left ankle, not elsewhere classified: Secondary | ICD-10-CM | POA: Diagnosis not present

## 2020-08-06 ENCOUNTER — Encounter: Payer: Self-pay | Admitting: Family Medicine

## 2020-08-06 ENCOUNTER — Telehealth (INDEPENDENT_AMBULATORY_CARE_PROVIDER_SITE_OTHER): Payer: Medicare HMO | Admitting: Family Medicine

## 2020-08-06 ENCOUNTER — Other Ambulatory Visit: Payer: Self-pay

## 2020-08-06 DIAGNOSIS — B9689 Other specified bacterial agents as the cause of diseases classified elsewhere: Secondary | ICD-10-CM

## 2020-08-06 DIAGNOSIS — J329 Chronic sinusitis, unspecified: Secondary | ICD-10-CM | POA: Diagnosis not present

## 2020-08-06 DIAGNOSIS — J301 Allergic rhinitis due to pollen: Secondary | ICD-10-CM | POA: Diagnosis not present

## 2020-08-06 MED ORDER — AMOXICILLIN 875 MG PO TABS: 875.0000 mg | ORAL_TABLET | Freq: Two times a day (BID) | ORAL | 0 refills | Status: DC

## 2020-08-06 MED ORDER — FLUTICASONE PROPIONATE 50 MCG/ACT NA SUSP: 2.0000 | Freq: Every day | NASAL | 6 refills | Status: AC

## 2020-08-06 NOTE — Progress Notes (Signed)
I connected with  Renee Flores on 08/06/20 by a video enabled telemedicine application and verified that I am speaking with the correct person using two identifiers.   I discussed the limitations of evaluation and management by telemedicine. The patient expressed understanding and agreed to proceed.

## 2020-08-06 NOTE — Progress Notes (Signed)
Virtual Visit via Video   I connected with patient on 08/06/20 at  9:30 AM EDT by a video enabled telemedicine application and verified that I am speaking with the correct person using two identifiers.  Location patient: Home Location provider: [facility] Persons participating in the virtual visit: Patient, Provider, [place] (Sabrina M)  I discussed the limitations of evaluation and management by telemedicine and the availability of in person appointments. The patient expressed understanding and agreed to proceed.  Subjective:   HPI:   URI- 'my sinuses and allergies'.  Pt reports congestion is draining 'from my head into my throat'.  Bilateral ear pressure.  sxs started 5 days ago.  + frontal sinus pressure, pain behind eyes.  No fevers.  No tooth pain.  + HA.  + cough- intermittently productive.  Taking OTC allergy pill in the AM and Singulair at night.  'I usually get this twice a year when the season is changing'.  No known sick contacts.  Pt has used nasal spray in the past.  ROS:   See pertinent positives and negatives per HPI.  Patient Active Problem List   Diagnosis Date Noted  . Sleep apnea   . Osteoporosis   . Neuromuscular disorder ([place])   . MS (multiple sclerosis) ([place])   . High cholesterol   . Heart valve problem   . Heart murmur   . GERD (gastroesophageal reflux disease)   . Factor 5 Leiden mutation, heterozygous ([place])   . Complication of anesthesia   . Bulging lumbar disc   . Arthritis   . Allergy   . Hypertension 04/13/2020  . Lumbar radiculopathy 08/12/2018  . OSA on CPAP 03/25/2017  . Hypersomnia with sleep apnea, unspecified 11/03/2013  . Severe obesity (BMI >= 40) ([place]) 11/03/2013  . Retrognathia 11/03/2013  . Shortness of breath 06/03/2013  . Right knee DJD 07/08/2012    Class: Chronic  . Preop cardiovascular exam 03/23/2012  . Osteopenia   . Vitamin D deficiency   . HYPERCHOLESTEROLEMIA 02/27/2010    Social History   Tobacco  Use  . Smoking status: Never Smoker  . Smokeless tobacco: Never Used  Substance Use Topics  . Alcohol use: Not Currently    Alcohol/week: 0.0 standard drinks    Current Outpatient Medications:  .  aspirin EC 81 MG tablet, Take 81 mg by mouth daily., Disp: , Rfl:  .  Calcium Carb-Cholecalciferol (CALCIUM + D3) 600-200 MG-UNIT TABS, Take 1 tablet by mouth daily., Disp: , Rfl:  .  cloNIDine (CATAPRES) 0.1 MG tablet, TAKE 1 TABLET THREE TIMES DAILY, Disp: 90 tablet, Rfl: 0 .  fluticasone (FLONASE) 50 MCG/ACT nasal spray, Place 2 sprays into the nose daily as needed for allergies. , Disp: , Rfl:  .  gabapentin (NEURONTIN) 600 MG tablet, Take 1 tablet (600 mg total) by mouth at bedtime., Disp: 90 tablet, Rfl: 0 .  hydrochlorothiazide (HYDRODIURIL) 25 MG tablet, Take 1 tablet (25 mg total) by mouth daily., Disp: 90 tablet, Rfl: 0 .  ketotifen (ZADITOR) 0.025 % ophthalmic solution, Place 2 drops into both eyes daily as needed (for irritation)., Disp: 5 mL, Rfl: 0 .  losartan (COZAAR) 50 MG tablet, Take 1 tablet (50 mg total) by mouth daily., Disp: 90 tablet, Rfl: 0 .  meloxicam (MOBIC) 15 MG tablet, Take 1 tablet (15 mg total) by mouth daily., Disp: 90 tablet, Rfl: 0 .  metoprolol succinate (TOPROL-XL) 25 MG 24 hr tablet, Take 1 tablet (25 mg total) by mouth daily.,  Disp: 90 tablet, Rfl: 0 .  montelukast (SINGULAIR) 10 MG tablet, Take 1 tablet (10 mg total) by mouth at bedtime., Disp: 90 tablet, Rfl: 0 .  Multiple Vitamins-Minerals (ONE-A-DAY WOMENS 50+ ADVANTAGE PO), Take by mouth., Disp: , Rfl:  .  omeprazole (PRILOSEC) 40 MG capsule, Take 1 capsule (40 mg total) by mouth daily., Disp: 90 capsule, Rfl: 0 .  simvastatin (ZOCOR) 40 MG tablet, TAKE 1 TABLET AT BEDTIME, Disp: 90 tablet, Rfl: 0  Allergies  Allergen Reactions  . Erythromycin Base Hives  . Morphine Other (See Comments)    Hallucinating   . Vancomycin Hcl Other (See Comments)    Ringing in ears  . Doxycycline Nausea Only and Rash     Objective:   There were no vitals taken for this visit. AAOx3, NAD NCAT, EOMI No obvious CN deficits + frontal sinus pain Coloring WNL Pt is able to speak clearly, coherently without shortness of breath or increased work of breathing.  + wet cough Thought process is linear.  Mood is appropriate.   Assessment and Plan:   Allergic rhinitis- deteriorated.  This is a seasonal problem for pt.  She is taking daily antihistamine and nightly Singulair but is still struggling w/ the high pollen counts.  Restart Flonase.  Discussed pollen avoidance when possible.  Pt expressed understanding and is in agreement w/ plan.   Bacterial sinusitis- new.  Likely triggered by the allergy inflammation which then predisposed for bacterial infxn.  Start Amoxicillin BID w/ food.  Encouraged OTC Mucinex DM for cough and congestion.  Fluids, rest.  Pt expressed understanding and is in agreement w/ plan.    Annye Asa, MD 08/06/2020

## 2020-08-17 ENCOUNTER — Other Ambulatory Visit: Payer: Self-pay | Admitting: Family Medicine

## 2020-08-17 DIAGNOSIS — I10 Essential (primary) hypertension: Secondary | ICD-10-CM

## 2020-08-17 DIAGNOSIS — K219 Gastro-esophageal reflux disease without esophagitis: Secondary | ICD-10-CM

## 2020-09-06 ENCOUNTER — Ambulatory Visit: Payer: Medicare HMO | Admitting: Adult Health

## 2020-09-06 ENCOUNTER — Encounter: Payer: Self-pay | Admitting: Adult Health

## 2020-09-06 ENCOUNTER — Other Ambulatory Visit: Payer: Self-pay

## 2020-09-06 VITALS — BP 155/89 | HR 88 | Ht 62.0 in | Wt 272.0 lb

## 2020-09-06 DIAGNOSIS — G4733 Obstructive sleep apnea (adult) (pediatric): Secondary | ICD-10-CM

## 2020-09-06 DIAGNOSIS — Z9989 Dependence on other enabling machines and devices: Secondary | ICD-10-CM

## 2020-09-06 NOTE — Progress Notes (Signed)
PATIENT: Renee Flores DOB: [birth year]  REASON FOR VISIT: follow up HISTORY FROM: patient  HISTORY OF PRESENT ILLNESS: Today 09/06/20:  Renee Flores is a 55 year old female with a history of OSA on CPAP. She returns today for follow-up.  She reports that the CPAP continues to work well for her.  She denies any new issues.  Her download is below    Renee Flores is a 55 year old female with a history of obstructive sleep apnea on CPAP.  Her download indicates that she uses her machine 29 out of 30 days for compliance of 97%.  She used her machine greater than 4 hours 26 days for compliance of 87%.  On average she uses her machine 6 hours and 6 minutes.  Her residual AHI is 1.2 on 7 cm of water with EPR 2.  Leak in the 95th percentile is 19.3 L/min.  Reports that the CPAP continues to work well for her.  HISTORY (Copied from Dr.Dohmeier's note)  OSA on CPAP- download. CPAP used 30/ 30 days, 93% over 4 hours, AHI is 2.1 and set at 7 CM water CPAP Machine was issued in 2014, but patient was not health insured.  She is accepted for disability, Medicare will follow on November 2020.  Uses a benefit card for prescriptions   REVIEW OF SYSTEMS: Out of a complete 14 system review of symptoms, the patient complains only of the following symptoms, and all other reviewed systems are negative.  FSS 40 ESS 14  ALLERGIES: Allergies  Allergen Reactions  . Erythromycin Base Hives  . Morphine Other (See Comments)    Hallucinating   . Vancomycin Hcl Other (See Comments)    Ringing in ears  . Doxycycline Nausea Only and Rash    HOME MEDICATIONS: Outpatient Medications Prior to Visit  Medication Sig Dispense Refill  . aspirin EC 81 MG tablet Take 81 mg by mouth daily.    . Calcium Carb-Cholecalciferol (CALCIUM + D3) 600-200 MG-UNIT TABS Take 1 tablet by mouth daily.    . cloNIDine (CATAPRES) 0.1 MG tablet TAKE 1 TABLET THREE TIMES DAILY 90 tablet 0  . fluticasone (FLONASE) 50 MCG/ACT  nasal spray Place 2 sprays into both nostrils daily. 16 g 6  . gabapentin (NEURONTIN) 600 MG tablet Take 1 tablet (600 mg total) by mouth at bedtime. 90 tablet 0  . hydrochlorothiazide (HYDRODIURIL) 25 MG tablet Take 1 tablet (25 mg total) by mouth daily. 90 tablet 0  . ketotifen (ZADITOR) 0.025 % ophthalmic solution Place 2 drops into both eyes daily as needed (for irritation). 5 mL 0  . losartan (COZAAR) 50 MG tablet Take 1 tablet (50 mg total) by mouth daily. 90 tablet 0  . meloxicam (MOBIC) 15 MG tablet Take 1 tablet (15 mg total) by mouth daily. 90 tablet 0  . metoprolol succinate (TOPROL-XL) 25 MG 24 hr tablet Take 1 tablet (25 mg total) by mouth daily. 90 tablet 0  . montelukast (SINGULAIR) 10 MG tablet Take 1 tablet (10 mg total) by mouth at bedtime. 90 tablet 0  . Multiple Vitamins-Minerals (ONE-A-DAY WOMENS 50+ ADVANTAGE PO) Take by mouth.    Marland Kitchen omeprazole (PRILOSEC) 40 MG capsule TAKE 1 CAPSULE EVERY DAY 90 capsule 0  . simvastatin (ZOCOR) 40 MG tablet TAKE 1 TABLET AT BEDTIME 90 tablet 0  . amoxicillin (AMOXIL) 875 MG tablet Take 1 tablet (875 mg total) by mouth 2 (two) times daily. 20 tablet 0   No facility-administered medications prior to visit.  PAST MEDICAL HISTORY: Past Medical History:  Diagnosis Date  . Allergy   . Arthritis   . Bulging lumbar disc    L4-5  . Complication of anesthesia    anxiety attack after waking up from anesthesia  . Factor 5 Leiden mutation, heterozygous ([place])   . Factor V deficiency, congenital ([place]) 03/23/2012  . GERD (gastroesophageal reflux disease)   . Heart murmur   . Heart palpitations   . Heart valve problem    mild- moderate mitral valve leakage   . High cholesterol   . HYPERCHOLESTEROLEMIA 02/27/2010   Qualifier: Diagnosis of  By: [name], Langley Gauss    . Hypersomnia with sleep apnea, unspecified 11/03/2013  . Hypertension   . Lumbar radiculopathy 08/12/2018  . MS (multiple sclerosis) ([place])    questionable  . Neuromuscular  disorder ([place])   . OSA on CPAP 03/25/2017  . Osteopenia 04/2016   T score -1.9  . Osteoporosis   . Preop cardiovascular exam 03/23/2012  . Retrognathia 11/03/2013  . Right knee DJD 07/08/2012  . Severe obesity (BMI >= 40) ([place]) 11/03/2013  . Shortness of breath 06/03/2013  . Sleep apnea   . Vitamin D deficiency 2014   17    PAST SURGICAL HISTORY: Past Surgical History:  Procedure Laterality Date  . ABDOMINAL HYSTERECTOMY    . APPENDECTOMY    . arthroscopic knee surgery    . CESAREAN SECTION     x2  . chloecystectomy    . CHOLECYSTECTOMY    . ESOPHAGEAL DILATION  04/09/2012  . KNEE LIGAMENT RECONSTRUCTION    . laser throat polyps    . NECK LESION BIOPSY    . PATELLA FRACTURE SURGERY    . PELVIC LAPAROSCOPY     LSO  . REPLACEMENT TOTAL KNEE Left 2012  . REPLACEMENT TOTAL KNEE Right 2014  . TOE SURGERY    . TONSILLECTOMY    . TONSILLECTOMY  1979  . TOTAL ABDOMINAL HYSTERECTOMY W/ BILATERAL SALPINGOOPHORECTOMY  2000  . TOTAL KNEE ARTHROPLASTY Left 2012  . TOTAL KNEE ARTHROPLASTY Right 07/08/2012   Procedure: TOTAL KNEE ARTHROPLASTY with revision components ;  Surgeon: Hessie Dibble, MD;  Location: [facility];  Service: Orthopedics;  Laterality: Right;  PATIENT HAS HX. OF FACTOR IV DISORFER  . TUBAL LIGATION      FAMILY HISTORY: Family History  Problem Relation Age of Onset  . Heart disease Father   . Hypertension Father   . Hyperlipidemia Father   . Cancer Father   . Other Father        Respiratory problems/Factor 5 disorder/1 bypass, 2 valve replacement (mitral and aortic)   . Hypertension Mother   . Diabetes Mother   . Hyperlipidemia Mother   . Hypertension Sister   . Colon cancer Paternal Aunt   . Breast cancer Paternal Aunt 68  . Heart disease Maternal Grandfather   . Lymphoma Maternal Grandfather   . Heart failure Maternal Grandfather   . Colon cancer Paternal Grandmother   . Hyperlipidemia Brother   . Hypertension Brother   . Hypertension Brother   . Colon  cancer Paternal Aunt   . Breast cancer Paternal Aunt 50  . Stroke Paternal Grandfather     SOCIAL HISTORY: Social History   Socioeconomic History  . Marital status: Widowed    Spouse name: Jenny Reichmann  . Number of children: 2  . Years of education: 21  . Highest education level: Not on file  Occupational History    Employer: [employer]  Tobacco Use  . Smoking status: Never Smoker  . Smokeless tobacco: Never Used  Substance and Sexual Activity  . Alcohol use: Not Currently    Alcohol/week: 0.0 standard drinks  . Drug use: No  . Sexual activity: Yes    Birth control/protection: Surgical    Comment: HYST-1st intercourse 55 yo-Fewer than 5 partners  Other Topics Concern  . Not on file  Social History Narrative   [name]  and lives at home with her husband.   Patient has two children.   Patient works as a Mudlogger for [employer].   Patient drinks one cup of caffeine daily.   Patient is right-handed.   Patient has a high school education.   Social Determinants of Health   Financial Resource Strain: Low Risk   . Difficulty of Paying Living Expenses: Not hard at all  Food Insecurity: No Food Insecurity  . Worried About Charity fundraiser in the Last Year: Never true  . Ran Out of Food in the Last Year: Never true  Transportation Needs: No Transportation Needs  . Lack of Transportation (Medical): No  . Lack of Transportation (Non-Medical): No  Physical Activity: Insufficiently Active  . Days of Exercise per Week: 7 days  . Minutes of Exercise per Session: 10 min  Stress: No Stress Concern Present  . Feeling of Stress : Not at all  Social Connections: Moderately Integrated  . Frequency of Communication with Friends and Family: More than three times a week  . Frequency of Social Gatherings with Friends and Family: More than three times a week  . Attends Religious Services: More than 4 times per year  . Active Member of Clubs or  Organizations: Yes  . Attends Archivist Meetings: 1 to 4 times per year  . Marital Status: Widowed  Intimate Partner Violence: Not At Risk  . Fear of Current or Ex-Partner: No  . Emotionally Abused: No  . Physically Abused: No  . Sexually Abused: No      PHYSICAL EXAM  Vitals:   09/06/20 0813  BP: (!) 155/89  Pulse: 88  Weight: 272 lb (123.4 kg)  Height: 5\' 2"  (1.575 m)   Body mass index is 49.75 kg/m.  Generalized: Well developed, in no acute distress  Chest: Lungs clear to auscultation bilaterally  Neurological examination  Mentation: Alert oriented to time, place, history taking. Follows all commands speech and language fluent Cranial nerve II-XII: Extraocular movements were full, visual field were full on confrontational test Head turning and shoulder shrug  were normal and symmetric. Motor: The motor testing reveals 5 over 5 strength of all 4 extremities. Good symmetric motor tone is noted throughout.  Sensory: Sensory testing is intact to soft touch on all 4 extremities. No evidence of extinction is noted.  Gait and station: Gait is normal.    DIAGNOSTIC DATA (LABS, IMAGING, TESTING) - I reviewed patient records, labs, notes, testing and imaging myself where available.  Lab Results  Component Value Date   WBC 9.3 04/18/2020   HGB 12.2 04/18/2020   HCT 37.1 04/18/2020   MCV 89.2 04/18/2020   PLT 297.0 04/18/2020      Component Value Date/Time   NA 139 04/18/2020 0900   K 4.4 04/18/2020 0900   CL 100 04/18/2020 0900   CO2 29 04/18/2020 0900   GLUCOSE 99 04/18/2020 0900   BUN 10 04/18/2020 0900   CREATININE 0.81 04/18/2020 0900   CALCIUM 9.8 04/18/2020  0900   PROT 7.6 04/18/2020 0900   ALBUMIN 4.5 04/18/2020 0900   AST 25 04/18/2020 0900   ALT 28 04/18/2020 0900   ALKPHOS 91 04/18/2020 0900   BILITOT 0.5 04/18/2020 0900   GFRNONAA >90 06/01/2013 0925   GFRAA >90 06/01/2013 0925      ASSESSMENT AND PLAN 55 y.o. year old female  has a  past medical history of Allergy, Arthritis, Bulging lumbar disc, Complication of anesthesia, Factor 5 Leiden mutation, heterozygous ([place]), Factor V deficiency, congenital ([place]) (03/23/2012), GERD (gastroesophageal reflux disease), Heart murmur, Heart palpitations, Heart valve problem, High cholesterol, HYPERCHOLESTEROLEMIA (02/27/2010), Hypersomnia with sleep apnea, unspecified (11/03/2013), Hypertension, Lumbar radiculopathy (08/12/2018), MS (multiple sclerosis) ([place]), Neuromuscular disorder ([place]), OSA on CPAP (03/25/2017), Osteopenia (04/2016), Osteoporosis, Preop cardiovascular exam (03/23/2012), Retrognathia (11/03/2013), Right knee DJD (07/08/2012), Severe obesity (BMI >= 40) ([place]) (11/03/2013), Shortness of breath (06/03/2013), Sleep apnea, and Vitamin D deficiency (2014). here with:  1. OSA on CPAP  - CPAP compliance excellent - Good treatment of AHI  - Encourage patient to use CPAP nightly and > 4 hours each night - F/U in 1 year or sooner if needed    Ward Givens, MSN, NP-C 09/06/2020, 8:19 AM [facility] [address] [place], [place] [address] [phone]

## 2020-09-06 NOTE — Patient Instructions (Signed)

## 2020-09-11 ENCOUNTER — Encounter: Payer: Self-pay | Admitting: Family Medicine

## 2020-09-11 ENCOUNTER — Telehealth: Payer: Self-pay | Admitting: Cardiology

## 2020-09-11 ENCOUNTER — Other Ambulatory Visit: Payer: Self-pay

## 2020-09-11 ENCOUNTER — Ambulatory Visit (INDEPENDENT_AMBULATORY_CARE_PROVIDER_SITE_OTHER): Payer: Medicare HMO | Admitting: Family Medicine

## 2020-09-11 VITALS — BP 130/80 | HR 87 | Temp 98.1°F | Resp 20 | Ht 63.0 in | Wt 268.0 lb

## 2020-09-11 DIAGNOSIS — Z1159 Encounter for screening for other viral diseases: Secondary | ICD-10-CM

## 2020-09-11 DIAGNOSIS — R590 Localized enlarged lymph nodes: Secondary | ICD-10-CM | POA: Diagnosis not present

## 2020-09-11 DIAGNOSIS — R002 Palpitations: Secondary | ICD-10-CM

## 2020-09-11 DIAGNOSIS — Z0000 Encounter for general adult medical examination without abnormal findings: Secondary | ICD-10-CM

## 2020-09-11 DIAGNOSIS — R0789 Other chest pain: Secondary | ICD-10-CM | POA: Diagnosis not present

## 2020-09-11 DIAGNOSIS — Z114 Encounter for screening for human immunodeficiency virus [HIV]: Secondary | ICD-10-CM | POA: Diagnosis not present

## 2020-09-11 DIAGNOSIS — E559 Vitamin D deficiency, unspecified: Secondary | ICD-10-CM | POA: Diagnosis not present

## 2020-09-11 LAB — HEPATIC FUNCTION PANEL
ALT: 67 U/L — ABNORMAL HIGH (ref 0–35)
AST: 65 U/L — ABNORMAL HIGH (ref 0–37)
Albumin: 4.6 g/dL (ref 3.5–5.2)
Alkaline Phosphatase: 100 U/L (ref 39–117)
Bilirubin, Direct: 0.1 mg/dL (ref 0.0–0.3)
Total Bilirubin: 0.5 mg/dL (ref 0.2–1.2)
Total Protein: 7.5 g/dL (ref 6.0–8.3)

## 2020-09-11 LAB — BASIC METABOLIC PANEL
BUN: 12 mg/dL (ref 6–23)
CO2: 28 mEq/L (ref 19–32)
Calcium: 9.8 mg/dL (ref 8.4–10.5)
Chloride: 99 mEq/L (ref 96–112)
Creatinine, Ser: 0.73 mg/dL (ref 0.40–1.20)
GFR: 93.62 mL/min (ref >60.00)
Glucose, Bld: 125 mg/dL — ABNORMAL HIGH (ref 70–99)
Potassium: 4 mEq/L (ref 3.5–5.1)
Sodium: 138 mEq/L (ref 135–145)

## 2020-09-11 LAB — LIPID PANEL
Cholesterol: 189 mg/dL (ref 0–200)
HDL: 64.4 mg/dL (ref >39.00)
NonHDL: 124.65
Total CHOL/HDL Ratio: 3
Triglycerides: 215 mg/dL — ABNORMAL HIGH (ref 0.0–149.0)
VLDL: 43 mg/dL — ABNORMAL HIGH (ref 0.0–40.0)

## 2020-09-11 LAB — CBC WITH DIFFERENTIAL/PLATELET
Basophils Absolute: 0.1 10*3/uL (ref 0.0–0.1)
Basophils Relative: 0.8 % (ref 0.0–3.0)
Eosinophils Absolute: 0.3 10*3/uL (ref 0.0–0.7)
Eosinophils Relative: 4.2 % (ref 0.0–5.0)
HCT: 37.9 % (ref 36.0–46.0)
Hemoglobin: 12.6 g/dL (ref 12.0–15.0)
Lymphocytes Relative: 30.7 % (ref 12.0–46.0)
Lymphs Abs: 2.2 10*3/uL (ref 0.7–4.0)
MCHC: 33.3 g/dL (ref 30.0–36.0)
MCV: 85.9 fl (ref 78.0–100.0)
Monocytes Absolute: 0.5 10*3/uL (ref 0.1–1.0)
Monocytes Relative: 6.7 % (ref 3.0–12.0)
Neutro Abs: 4 10*3/uL (ref 1.4–7.7)
Neutrophils Relative %: 57.6 % (ref 43.0–77.0)
Platelets: 268 10*3/uL (ref 150.0–400.0)
RBC: 4.41 Mil/uL (ref 3.87–5.11)
RDW: 13.5 % (ref 11.5–15.5)
WBC: 7 10*3/uL (ref 4.0–10.5)

## 2020-09-11 LAB — LDL CHOLESTEROL, DIRECT: Direct LDL: 94 mg/dL

## 2020-09-11 LAB — VITAMIN D 25 HYDROXY (VIT D DEFICIENCY, FRACTURES): VITD: 28.82 ng/mL — ABNORMAL LOW (ref 30.00–100.00)

## 2020-09-11 LAB — TSH: TSH: 1.57 u[IU]/mL (ref 0.35–4.50)

## 2020-09-11 NOTE — Assessment & Plan Note (Signed)
Check labs and replete prn. 

## 2020-09-11 NOTE — Assessment & Plan Note (Signed)
Pt's PE WNL w/ exception of obesity.  UTD on colonoscopy, mammo, COVID vaccines.  Check labs.  Anticipatory guidance provided.

## 2020-09-11 NOTE — Progress Notes (Signed)
Subjective:    Patient ID: Renee Flores, female    DOB: [birth year], 55 y.o.   MRN: [id number]  HPI CPE- UTD on colonoscopy, mammo, COVID vaccines.  Due for Hep C and HIV screening.  No need for pap due to TAH  Reviewed past medical, surgical, family and social histories.   Patient Care Team    Relationship Specialty Notifications Start End  [name], MD PCP - General Family Medicine  04/13/20   Park Liter, MD PCP - Cardiology Cardiology  04/30/20   Dohmeier, Asencion Partridge, MD Consulting Physician Neurology  09/11/20   Wallene Huh, [place] Consulting Physician Podiatry  09/11/20   Erle Crocker, MD Consulting Physician Orthopedic Surgery  09/11/20       Health Maintenance  Topic Date Due  . PAP SMEAR-Modifier  09/11/2020 (Originally 02/01/2019)  . TETANUS/TDAP  04/13/2021 (Originally 04/28/2016)  . Hepatitis C Screening  04/13/2021 (Originally 11/09/1984)  . HIV Screening  04/13/2021 (Originally 11/09/1981)  . INFLUENZA VACCINE  11/26/2020  . MAMMOGRAM  05/02/2021  . COLONOSCOPY (Pts 45-86yrs Insurance coverage will need to be confirmed)  08/04/2029  . COVID-19 Vaccine  Completed  . HPV VACCINES  Aged Out      Review of Systems Patient reports no vision/ hearing changes, adenopathy,fever, weight change,  persistant/recurrent hoarseness , swallowing issues, edema, persistant/recurrent cough, hemoptysis, gastrointestinal bleeding (melena, rectal bleeding), abdominal pain, significant heartburn, bowel changes, GU symptoms (dysuria, hematuria, incontinence), Gyn symptoms (abnormal  bleeding, pain),  syncope, focal weakness, memory loss, numbness & tingling, skin/hair/nail changes, abnormal bruising or bleeding, anxiety, or depression.   + palpitations- 2 episodes last week, 1 while eating breakfast and 1 before bed + chest pain w/ first episode of palpitations + SOB w/ exertion  This visit occurred during the SARS-CoV-2 public health emergency.  Safety protocols  were in place, including screening questions prior to the visit, additional usage of staff PPE, and extensive cleaning of exam room while observing appropriate contact time as indicated for disinfecting solutions.       Objective:   Physical Exam General Appearance:    Alert, cooperative, no distress, appears stated age, obese  Head:    Normocephalic, without obvious abnormality, atraumatic  Eyes:    PERRL, conjunctiva/corneas clear, EOM's intact, fundi    benign, both eyes  Ears:    Normal TM's and external ear canals, both ears  Nose:   Deferred due to COVID  Throat:   Neck:   Supple, symmetrical, trachea midline, no adenopathy;    Thyroid: no enlargement/tenderness/nodules  Back:     Symmetric, no curvature, ROM normal, no CVA tenderness  Lungs:     Clear to auscultation bilaterally, respirations unlabored  Chest Wall:    No tenderness or deformity   Heart:    Regular rate and rhythm, S1 and S2 normal, no murmur, rub   or gallop  Breast Exam:    Deferred to mammo  Abdomen:     Soft, non-tender, bowel sounds active all four quadrants,    no masses, no organomegaly  Genitalia:    Deferred  Rectal:    Extremities:   Extremities normal, atraumatic, no cyanosis or edema  Pulses:   2+ and symmetric all extremities  Skin:   Skin color, texture, turgor normal, no rashes or lesions  Lymph nodes:   Cervical, supraclavicular, and axillary nodes normal  Neurologic:   CNII-XII intact, normal strength, sensation and reflexes    throughout  Assessment & Plan:

## 2020-09-11 NOTE — Patient Instructions (Signed)
Follow up in 6 months to recheck BP and cholesterol We'll notify you of your lab results and make any changes if needed We'll call you to schedule the ultrasound of your lymph node and your cardiology appt If you again have palpitations, chest pain, or other concerns- GO TO THE ER Get your Tdap vaccine at the pharmacy Call with any questions or concerns Stay Safe!  Stay Healthy!

## 2020-09-11 NOTE — Telephone Encounter (Signed)
   Erika with [facility], she said Dr. Birdie Riddle sent a urgent referral for pt to be seen by Dr. Raliegh Ip. Pt has palpitations and chest pain. Pt can only come to the [facility] office, she is requesting if pt can get in next week. She said to call pt to schedule. No available appt with Dr. Raliegh Ip next week at [facility]

## 2020-09-11 NOTE — Assessment & Plan Note (Signed)
Pt's BMI is 47.47  Stressed need for healthy diet and regular exercise.  Check labs to risk stratify.  Will follow.

## 2020-09-12 ENCOUNTER — Other Ambulatory Visit (INDEPENDENT_AMBULATORY_CARE_PROVIDER_SITE_OTHER): Payer: Medicare HMO

## 2020-09-12 ENCOUNTER — Other Ambulatory Visit: Payer: Self-pay

## 2020-09-12 DIAGNOSIS — R7309 Other abnormal glucose: Secondary | ICD-10-CM

## 2020-09-12 DIAGNOSIS — R748 Abnormal levels of other serum enzymes: Secondary | ICD-10-CM

## 2020-09-12 LAB — HEPATITIS C ANTIBODY
Hepatitis C Ab: NONREACTIVE
SIGNAL TO CUT-OFF: 0.02 (ref <1.00)

## 2020-09-12 LAB — HIV ANTIBODY (ROUTINE TESTING W REFLEX): HIV 1&2 Ab, 4th Generation: NONREACTIVE

## 2020-09-12 LAB — HEMOGLOBIN A1C: Hgb A1c MFr Bld: 6.1 % (ref 4.6–6.5)

## 2020-09-12 MED ORDER — VITAMIN D (ERGOCALCIFEROL) 1.25 MG (50000 UNIT) PO CAPS: 50000.0000 [IU] | ORAL_CAPSULE | ORAL | 0 refills | Status: AC

## 2020-09-13 NOTE — Telephone Encounter (Signed)
Called patient double booked her next week in [place] per Dr. Agustin Cree request.

## 2020-09-16 ENCOUNTER — Other Ambulatory Visit: Payer: Self-pay | Admitting: Family Medicine

## 2020-09-16 DIAGNOSIS — I270 Primary pulmonary hypertension: Secondary | ICD-10-CM

## 2020-09-16 DIAGNOSIS — E1169 Type 2 diabetes mellitus with other specified complication: Secondary | ICD-10-CM

## 2020-09-16 DIAGNOSIS — I10 Essential (primary) hypertension: Secondary | ICD-10-CM

## 2020-09-16 DIAGNOSIS — T7840XA Allergy, unspecified, initial encounter: Secondary | ICD-10-CM

## 2020-09-16 DIAGNOSIS — E785 Hyperlipidemia, unspecified: Secondary | ICD-10-CM

## 2020-09-16 DIAGNOSIS — M5416 Radiculopathy, lumbar region: Secondary | ICD-10-CM

## 2020-09-16 DIAGNOSIS — R52 Pain, unspecified: Secondary | ICD-10-CM

## 2020-09-17 NOTE — Telephone Encounter (Signed)
LFD 06/26/20 #90 with no refills LOV 09/11/20 NOV 03/14/21

## 2020-09-18 DIAGNOSIS — E7800 Pure hypercholesterolemia, unspecified: Secondary | ICD-10-CM | POA: Insufficient documentation

## 2020-09-18 DIAGNOSIS — G4730 Sleep apnea, unspecified: Secondary | ICD-10-CM | POA: Insufficient documentation

## 2020-09-18 DIAGNOSIS — R002 Palpitations: Secondary | ICD-10-CM | POA: Insufficient documentation

## 2020-09-19 ENCOUNTER — Ambulatory Visit (INDEPENDENT_AMBULATORY_CARE_PROVIDER_SITE_OTHER): Payer: Medicare HMO

## 2020-09-19 ENCOUNTER — Telehealth: Payer: Self-pay | Admitting: Cardiology

## 2020-09-19 ENCOUNTER — Encounter: Payer: Self-pay | Admitting: Cardiology

## 2020-09-19 ENCOUNTER — Ambulatory Visit (INDEPENDENT_AMBULATORY_CARE_PROVIDER_SITE_OTHER): Payer: Medicare HMO | Admitting: Cardiology

## 2020-09-19 ENCOUNTER — Other Ambulatory Visit: Payer: Self-pay

## 2020-09-19 VITALS — BP 144/86 | HR 80 | Ht 63.0 in | Wt 271.0 lb

## 2020-09-19 DIAGNOSIS — R002 Palpitations: Secondary | ICD-10-CM | POA: Diagnosis not present

## 2020-09-19 DIAGNOSIS — I10 Essential (primary) hypertension: Secondary | ICD-10-CM | POA: Diagnosis not present

## 2020-09-19 DIAGNOSIS — E7800 Pure hypercholesterolemia, unspecified: Secondary | ICD-10-CM

## 2020-09-19 DIAGNOSIS — I38 Endocarditis, valve unspecified: Secondary | ICD-10-CM

## 2020-09-19 DIAGNOSIS — R0789 Other chest pain: Secondary | ICD-10-CM | POA: Insufficient documentation

## 2020-09-19 NOTE — Telephone Encounter (Signed)
Called patient informed her if she wants to do a treadmill test it will need to be nuclear still. She decided to just do the lexiscan then. No further questions. She will keep as is.

## 2020-09-19 NOTE — Telephone Encounter (Signed)
Patient states she would like to do the treadmill stress test instead of the nuclear.

## 2020-09-19 NOTE — Patient Instructions (Signed)
Medication Instructions:  Your physician recommends that you continue on your current medications as directed. Please refer to the Current Medication list given to you today.  *If you need a refill on your cardiac medications before your next appointment, please call your pharmacy*   Lab Work: Your physician recommends that you return for lab work in 1 week: bmp  If you have labs (blood work) drawn today and your tests are completely normal, you will receive your results only by: Marland Kitchen MyChart Message (if you have MyChart) OR . A paper copy in the mail If you have any lab test that is abnormal or we need to change your treatment, we will call you to review the results.   Testing/Procedures: A zio monitor was ordered today. It will remain on for 7 days. You will then return monitor and event diary in provided box. It takes 1-2 weeks for report to be downloaded and returned to [place]. We will call you with the results. If monitor falls off or has orange flashing light, please call Zio for further instructions.      [facility] at [facility] [address] [place],  [address] Phone: [phone]    Please arrive 15 minutes prior to your appointment time for registration and insurance purposes.  The test will take approximately 3 to 4 hours to complete; you may bring reading material.  If someone comes with you to your appointment, they will need to remain in the main lobby due to limited space in the testing area. **If you are pregnant or breastfeeding, please notify the nuclear lab prior to your appointment**  How to prepare for your Myocardial Perfusion Test: . Do not eat or drink 3 hours prior to your test, except you may have water. . Do not consume products containing caffeine (regular or decaffeinated) 12 hours prior to your test. (ex: coffee, chocolate, sodas, tea). . Do bring a list of your current medications with you.  If not listed  below, you may take your medications as normal.  . Do wear comfortable clothes (no dresses or overalls) and walking shoes, tennis shoes preferred (No heels or open toe shoes are allowed). . Do NOT wear cologne, perfume, aftershave, or lotions (deodorant is allowed). . If these instructions are not followed, your test will have to be rescheduled.  Please report to [address], [address] for your test.  If you have questions or concerns about your appointment, you can call the Nuclear Lab at [phone].  If you cannot keep your appointment, please provide 24 hours notification to the Nuclear Lab, to avoid a possible $50 charge to your account.   Follow-Up: At [facility], you and your health needs are our priority.  As part of our continuing mission to provide you with exceptional heart care, we have created designated Provider Care Teams.  These Care Teams include your primary Cardiologist (physician) and Advanced Practice Providers (APPs -  Physician Assistants and Nurse Practitioners) who all work together to provide you with the care you need, when you need it.  We recommend signing up for the patient portal called "MyChart".  Sign up information is provided on this After Visit Summary.  MyChart is used to connect with patients for Virtual Visits (Telemedicine).  Patients are able to view lab/test results, encounter notes, upcoming appointments, etc.  Non-urgent messages can be sent to your provider as well.   To learn more about what you can do with  MyChart, go to [email or web address].    Your next appointment:   4 month(s)  The format for your next appointment:   In Person  Provider:   [name], MD   Other Instructions   Cardiac Nuclear Scan A cardiac nuclear scan is a test that measures blood flow to the heart when a person is resting and when he or she is exercising. The test looks for problems such as:  Not enough blood reaching a portion of the  heart.  The heart muscle not working normally. You may need this test if:  You have heart disease.  You have had abnormal lab results.  You have had heart surgery or a balloon procedure to open up blocked arteries (angioplasty).  You have chest pain.  You have shortness of breath. In this test, a radioactive dye (tracer) is injected into your bloodstream. After the tracer has traveled to your heart, an imaging device is used to measure how much of the tracer is absorbed by or distributed to various areas of your heart. This procedure is usually done at a hospital and takes 2-4 hours. Tell a health care provider about:  Any allergies you have.  All medicines you are taking, including vitamins, herbs, eye drops, creams, and over-the-counter medicines.  Any problems you or family members have had with anesthetic medicines.  Any blood disorders you have.  Any surgeries you have had.  Any medical conditions you have.  Whether you are pregnant or may be pregnant. What are the risks? Generally, this is a safe procedure. However, problems may occur, including:  Serious chest pain and heart attack. This is only a risk if the stress portion of the test is done.  Rapid heartbeat.  Sensation of warmth in your chest. This usually passes quickly.  Allergic reaction to the tracer. What happens before the procedure?  Ask your health care provider about changing or stopping your regular medicines. This is especially important if you are taking diabetes medicines or blood thinners.  Follow instructions from your health care provider about eating or drinking restrictions.  Remove your jewelry on the day of the procedure. What happens during the procedure?  An IV will be inserted into one of your veins.  Your health care provider will inject a small amount of radioactive tracer through the IV.  You will wait for 20-40 minutes while the tracer travels through your bloodstream.  Your  heart activity will be monitored with an electrocardiogram (ECG).  You will lie down on an exam table.  Images of your heart will be taken for about 15-20 minutes.  You may also have a stress test. For this test, one of the following may be done: ? You will exercise on a treadmill or stationary bike. While you exercise, your heart's activity will be monitored with an ECG, and your blood pressure will be checked. ? You will be given medicines that will increase blood flow to parts of your heart. This is done if you are unable to exercise.  When blood flow to your heart has peaked, a tracer will again be injected through the IV.  After 20-40 minutes, you will get back on the exam table and have more images taken of your heart.  Depending on the type of tracer used, scans may need to be repeated 3-4 hours later.  Your IV line will be removed when the procedure is over. The procedure may vary among health care providers and hospitals. What happens after the procedure?  Unless your health care provider tells you otherwise, you may return to your normal schedule, including diet, activities, and medicines.  Unless your health care provider tells you otherwise, you may increase your fluid intake. This will help to flush the contrast dye from your body. Drink enough fluid to keep your urine pale yellow.  Ask your health care provider, or the department that is doing the test: ? When will my results be ready? ? How will I get my results? Summary  A cardiac nuclear scan measures the blood flow to the heart when a person is resting and when he or she is exercising.  Tell your health care provider if you are pregnant.  Before the procedure, ask your health care provider about changing or stopping your regular medicines. This is especially important if you are taking diabetes medicines or blood thinners.  After the procedure, unless your health care provider tells you otherwise, increase your  fluid intake. This will help flush the contrast dye from your body.  After the procedure, unless your health care provider tells you otherwise, you may return to your normal schedule, including diet, activities, and medicines. This information is not intended to replace advice given to you by your health care provider. Make sure you discuss any questions you have with your health care provider. Document Revised: 09/28/2017 Document Reviewed: 09/28/2017 Elsevier Patient Education  [place].

## 2020-09-19 NOTE — Addendum Note (Signed)
Addended by: Senaida Ores on: 09/19/2020 10:03 AM   Modules accepted: Orders

## 2020-09-19 NOTE — Progress Notes (Signed)
Cardiology Office Note:    Date:  09/19/2020   ID:  Renee Flores, DOB 13-Mar-1966, MRN [id number]  PCP:  [name], MD  Cardiologist:  [name], MD    Referring MD: [name], MD   Chief Complaint  Patient presents with  . Chest Pain  . Tachycardia  . Dizziness  . Shortness of Breath    History of Present Illness:    Renee Flores is a 55 y.o. female  Who was originally referred to [place] before left ankle surgery.  At that time she was complaining of having some fatigue tired as well as shortness of breath there was also some suspicion for follow-up pathology likely echocardiogram showed only trivial mitral regurgitation.  She did go to surgery with no difficulties and now just came back for follow-up however she does have constellation of a lot of symptoms.  First of all she complained of having 2 episodes of chest tightness does happen at rest lasted for couple minutes there was no sweating associated with this sensation no shortness of breath no radiation.  Also she described episode of palpitations which she mean by that her heart speeding up that make her feel very uncomfortable and does episode of chest pain happen with palpitations.  1 time she felt like she almost ready to passed out likely she did not.  She tells me that palpitations happened about 2-3 times a week.  Past Medical History:  Diagnosis Date  . Allergy   . Arthritis   . Bulging lumbar disc    L4-5  . Complication of anesthesia    anxiety attack after waking up from anesthesia  . Factor 5 Leiden mutation, heterozygous ([place])   . Factor V deficiency, congenital ([place]) 03/23/2012  . GERD (gastroesophageal reflux disease)   . Heart murmur   . Heart palpitations   . Heart valve problem    mild- moderate mitral valve leakage   . High cholesterol   . HYPERCHOLESTEROLEMIA 02/27/2010   Qualifier: Diagnosis of  By: [name], Langley Gauss    . Hypersomnia with sleep apnea, unspecified  11/03/2013  . Hypertension   . Lumbar radiculopathy 08/12/2018  . MS (multiple sclerosis) ([place])    questionable  . Neuromuscular disorder ([place])   . OSA on CPAP 03/25/2017  . Osteopenia 04/2016   T score -1.9  . Osteoporosis   . Preop cardiovascular exam 03/23/2012  . Retrognathia 11/03/2013  . Right knee DJD 07/08/2012  . Severe obesity (BMI >= 40) ([place]) 11/03/2013  . Shortness of breath 06/03/2013  . Sleep apnea   . Vitamin D deficiency 2014   17    Past Surgical History:  Procedure Laterality Date  . ABDOMINAL HYSTERECTOMY    . APPENDECTOMY    . arthroscopic knee surgery    . CESAREAN SECTION     x2  . chloecystectomy    . CHOLECYSTECTOMY    . ESOPHAGEAL DILATION  04/09/2012  . KNEE LIGAMENT RECONSTRUCTION    . laser throat polyps    . NECK LESION BIOPSY    . PATELLA FRACTURE SURGERY    . PELVIC LAPAROSCOPY     LSO  . REPLACEMENT TOTAL KNEE Left 2012  . REPLACEMENT TOTAL KNEE Right 2014  . TOE SURGERY    . TONSILLECTOMY    . TONSILLECTOMY  1979  . TOTAL ABDOMINAL HYSTERECTOMY W/ BILATERAL SALPINGOOPHORECTOMY  2000  . TOTAL KNEE ARTHROPLASTY Left 2012  . TOTAL KNEE ARTHROPLASTY Right 07/08/2012   Procedure:  TOTAL KNEE ARTHROPLASTY with revision components ;  Surgeon: Hessie Dibble, MD;  Location: [facility];  Service: Orthopedics;  Laterality: Right;  PATIENT HAS HX. OF FACTOR IV DISORFER  . TUBAL LIGATION      Current Medications: Current Meds  Medication Sig  . aspirin EC 81 MG tablet Take 81 mg by mouth daily.  . cloNIDine (CATAPRES) 0.1 MG tablet TAKE 1 TABLET THREE TIMES DAILY (Patient taking differently: Take 0.1 mg by mouth 3 (three) times daily.)  . fluticasone (FLONASE) 50 MCG/ACT nasal spray Place 2 sprays into both nostrils daily.  Marland Kitchen gabapentin (NEURONTIN) 600 MG tablet TAKE 1 TABLET AT BEDTIME (Patient taking differently: Take 600 mg by mouth at bedtime.)  . hydrochlorothiazide (HYDRODIURIL) 25 MG tablet Take 1 tablet (25 mg total) by mouth daily.  Marland Kitchen ketotifen  (ZADITOR) 0.025 % ophthalmic solution Place 2 drops into both eyes daily as needed (for irritation).  Marland Kitchen losartan (COZAAR) 50 MG tablet Take 1 tablet (50 mg total) by mouth daily.  . meloxicam (MOBIC) 15 MG tablet TAKE 1 TABLET EVERY DAY (Patient taking differently: Take 15 mg by mouth daily.)  . metoprolol succinate (TOPROL-XL) 25 MG 24 hr tablet TAKE 1 TABLET EVERY DAY (Patient taking differently: Take 25 mg by mouth daily.)  . montelukast (SINGULAIR) 10 MG tablet TAKE 1 TABLET AT BEDTIME (Patient taking differently: Take 10 mg by mouth at bedtime.)  . Multiple Vitamins-Minerals (ONE-A-DAY WOMENS 50+ ADVANTAGE PO) Take 1 tablet by mouth daily. Unknown strength  . omeprazole (PRILOSEC) 40 MG capsule TAKE 1 CAPSULE EVERY DAY (Patient taking differently: Take 40 mg by mouth daily.)  . simvastatin (ZOCOR) 40 MG tablet TAKE 1 TABLET AT BEDTIME (Patient taking differently: Take 40 mg by mouth daily at 6 PM.)  . Vitamin D, Ergocalciferol, (DRISDOL) 1.25 MG (50000 UNIT) CAPS capsule Take 1 capsule (50,000 Units total) by mouth every 7 (seven) days for 12 doses.     Allergies:   Erythromycin base, Morphine, Vancomycin hcl, and Doxycycline   Social History   Socioeconomic History  . Marital status: Widowed    Spouse name: Renee Flores  . Number of children: 2  . Years of education: 38  . Highest education level: Not on file  Occupational History    Employer: [employer]  Tobacco Use  . Smoking status: Never Smoker  . Smokeless tobacco: Never Used  Substance and Sexual Activity  . Alcohol use: Not Currently    Alcohol/week: 0.0 standard drinks  . Drug use: No  . Sexual activity: Yes    Birth control/protection: Surgical    Comment: HYST-1st intercourse 55 yo-Fewer than 5 partners  Other Topics Concern  . Not on file  Social History Narrative   Patient is married (Renee Flores)  and lives at home with her husband.   Patient has two children.   Patient works as a Mudlogger for  [employer].   Patient drinks one cup of caffeine daily.   Patient is right-handed.   Patient has a high school education.   Social Determinants of Health   Financial Resource Strain: Low Risk   . Difficulty of Paying Living Expenses: Not hard at all  Food Insecurity: No Food Insecurity  . Worried About Charity fundraiser in the Last Year: Never true  . Ran Out of Food in the Last Year: Never true  Transportation Needs: No Transportation Needs  . Lack of Transportation (Medical): No  . Lack of Transportation (Non-Medical): No  Physical  Activity: Insufficiently Active  . Days of Exercise per Week: 7 days  . Minutes of Exercise per Session: 10 min  Stress: No Stress Concern Present  . Feeling of Stress : Not at all  Social Connections: Moderately Integrated  . Frequency of Communication with Friends and Family: More than three times a week  . Frequency of Social Gatherings with Friends and Family: More than three times a week  . Attends Religious Services: More than 4 times per year  . Active Member of Clubs or Organizations: Yes  . Attends Archivist Meetings: 1 to 4 times per year  . Marital Status: Widowed     Family History: The patient's family history includes Breast cancer (age of onset: 68) in her paternal aunt; Breast cancer (age of onset: 22) in her paternal aunt; Cancer in her father; Colon cancer in her paternal aunt, paternal aunt, and paternal grandmother; Diabetes in her mother; Heart disease in her father and maternal grandfather; Heart failure in her maternal grandfather; Hyperlipidemia in her brother, father, and mother; Hypertension in her brother, brother, father, mother, and sister; Lymphoma in her maternal grandfather; Other in her father; Stroke in her paternal grandfather. ROS:   Please see the history of present illness.    All 14 point review of systems negative except as described per history of present illness  EKGs/Labs/Other Studies Reviewed:       Recent Labs: 09/11/2020: ALT 67; BUN 12; Creatinine, Ser 0.73; Hemoglobin 12.6; Platelets 268.0; Potassium 4.0; Sodium 138; TSH 1.57  Recent Lipid Panel    Component Value Date/Time   CHOL 189 09/11/2020 1037   TRIG 215.0 (H) 09/11/2020 1037   HDL 64.40 09/11/2020 1037   CHOLHDL 3 09/11/2020 1037   VLDL 43.0 (H) 09/11/2020 1037   LDLDIRECT 94.0 09/11/2020 1037    Physical Exam:    VS:  BP (!) 144/86 (BP Location: Right Arm, Patient Position: Sitting)   Pulse 80   Ht 5\' 3"  (1.6 m)   Wt 271 lb (122.9 kg)   SpO2 94%   BMI 48.01 kg/m     Wt Readings from Last 3 Encounters:  09/19/20 271 lb (122.9 kg)  09/11/20 268 lb (121.6 kg)  09/06/20 272 lb (123.4 kg)     GEN:  Well nourished, well developed in no acute distress HEENT: Normal NECK: No JVD; No carotid bruits LYMPHATICS: No lymphadenopathy CARDIAC: RRR, no murmurs, no rubs, no gallops RESPIRATORY:  Clear to auscultation without rales, wheezing or rhonchi  ABDOMEN: Soft, non-tender, non-distended MUSCULOSKELETAL:  No edema; No deformity  SKIN: Warm and dry LOWER EXTREMITIES: no swelling NEUROLOGIC:  Alert and oriented x 3 PSYCHIATRIC:  Normal affect   ASSESSMENT:    1. Primary hypertension   2. Atypical chest pain   3. Palpitations   4. High cholesterol   5. Heart valve problem    PLAN:    In order of problems listed above:  1. Atypical chest pain.  Obviously concerning this lady with multiple risk factors for coronary artery disease.  I think the best approach to this situation will be to perform [place].  The reason why I think we need to do a Lexiscan is the fact that she just recently got an ankle surgery still did not recover fully from it will be difficult on her to walk on the treadmill.  I would not change any of her medication right now. 2. Palpitations ask him to wear Zio patch this is a new problem need to  be addressed.  I will not alter any of her medication. 3. Dyslipidemia, I did review her K  PN which show me her HDL of 64 however I do not see LDL.  I did review computer which show me direct LDL which was 94.  We will continue present management. 4. Essential hypertension still not well controlled I propose to double the dose of losartan 200 mg daily.  I will check Chem-7 next week.   Medication Adjustments/Labs and Tests Ordered: Current medicines are reviewed at length with the patient today.  Concerns regarding medicines are outlined above.  No orders of the defined types were placed in this encounter.  Medication changes: No orders of the defined types were placed in this encounter.   Signed, Park Liter, MD, [facility] 09/19/2020 9:55 AM    Welcome

## 2020-09-20 ENCOUNTER — Ambulatory Visit (HOSPITAL_BASED_OUTPATIENT_CLINIC_OR_DEPARTMENT_OTHER)
Admission: RE | Admit: 2020-09-20 | Discharge: 2020-09-20 | Disposition: A | Discharge status: Home / Self Care | Payer: Medicare HMO | Source: Ambulatory Visit | Attending: Family Medicine | Admitting: Family Medicine

## 2020-09-20 ENCOUNTER — Other Ambulatory Visit: Payer: Self-pay | Admitting: Family Medicine

## 2020-09-20 DIAGNOSIS — R590 Localized enlarged lymph nodes: Secondary | ICD-10-CM | POA: Diagnosis not present

## 2020-09-20 DIAGNOSIS — R221 Localized swelling, mass and lump, neck: Secondary | ICD-10-CM | POA: Diagnosis not present

## 2020-09-20 NOTE — Addendum Note (Signed)
Addended by: Darrel Reach on: 09/20/2020 02:53 PM   Modules accepted: Orders

## 2020-09-26 ENCOUNTER — Other Ambulatory Visit: Payer: Self-pay

## 2020-09-26 ENCOUNTER — Other Ambulatory Visit (INDEPENDENT_AMBULATORY_CARE_PROVIDER_SITE_OTHER): Payer: Medicare HMO

## 2020-09-26 ENCOUNTER — Telehealth: Payer: Self-pay | Admitting: Cardiology

## 2020-09-26 ENCOUNTER — Telehealth: Payer: Self-pay | Admitting: Family Medicine

## 2020-09-26 DIAGNOSIS — E7800 Pure hypercholesterolemia, unspecified: Secondary | ICD-10-CM

## 2020-09-26 DIAGNOSIS — I38 Endocarditis, valve unspecified: Secondary | ICD-10-CM | POA: Diagnosis not present

## 2020-09-26 DIAGNOSIS — R002 Palpitations: Secondary | ICD-10-CM | POA: Diagnosis not present

## 2020-09-26 DIAGNOSIS — R748 Abnormal levels of other serum enzymes: Secondary | ICD-10-CM

## 2020-09-26 LAB — HEPATIC FUNCTION PANEL
ALT: 56 U/L — ABNORMAL HIGH (ref 0–35)
AST: 48 U/L — ABNORMAL HIGH (ref 0–37)
Albumin: 4.2 g/dL (ref 3.5–5.2)
Alkaline Phosphatase: 86 U/L (ref 39–117)
Bilirubin, Direct: 0.1 mg/dL (ref 0.0–0.3)
Total Bilirubin: 0.5 mg/dL (ref 0.2–1.2)
Total Protein: 6.9 g/dL (ref 6.0–8.3)

## 2020-09-26 NOTE — Telephone Encounter (Signed)
New Message:    Pt need an order sent asap to [facility] for BMP. She had already had the blood work, just need this order added asap please.

## 2020-09-26 NOTE — Telephone Encounter (Signed)
Spoke to patient she wanted me to call her pcp to have them add on a bmp to the labs that were drawn today. Per the patient they could not use the [facility] order that we have in the computer. Called patient's pcp asked them to do this. They report they do not normally do other offices labs. I understood. However this is the patient's request they will let me know if not able to do.

## 2020-09-26 NOTE — Telephone Encounter (Signed)
I'm ok adding on the BMP but it would depend on the tubes that were drawn

## 2020-09-26 NOTE — Telephone Encounter (Signed)
FYI: BMP has been added to patient lab. Previous labs were drawn with a SST.

## 2020-09-26 NOTE — Telephone Encounter (Signed)
Renee Flores called in asking if we can add on a BMP to the labs that were drawn today.   Please advise   Renee Flores [phone]

## 2020-09-28 ENCOUNTER — Other Ambulatory Visit: Payer: Self-pay

## 2020-09-28 ENCOUNTER — Encounter: Payer: Self-pay | Admitting: Registered Nurse

## 2020-09-28 ENCOUNTER — Ambulatory Visit (INDEPENDENT_AMBULATORY_CARE_PROVIDER_SITE_OTHER): Payer: Medicare HMO | Admitting: Registered Nurse

## 2020-09-28 VITALS — BP 127/67 | HR 87 | Temp 98.2°F | Resp 18 | Ht 63.0 in | Wt 268.8 lb

## 2020-09-28 DIAGNOSIS — H6690 Otitis media, unspecified, unspecified ear: Secondary | ICD-10-CM | POA: Diagnosis not present

## 2020-09-28 LAB — BASIC METABOLIC PANEL
BUN: 13 mg/dL (ref 6–23)
CO2: 20 mEq/L (ref 19–32)
Calcium: 9.5 mg/dL (ref 8.4–10.5)
Chloride: 102 mEq/L (ref 96–112)
Creatinine, Ser: 0.79 mg/dL (ref 0.40–1.20)
GFR: 85.12 mL/min (ref >60.00)
Glucose, Bld: 114 mg/dL — ABNORMAL HIGH (ref 70–99)
Potassium: 4.4 mEq/L (ref 3.5–5.1)
Sodium: 143 mEq/L (ref 135–145)

## 2020-09-28 MED ORDER — AZELASTINE HCL 0.1 % NA SOLN: 1.0000 | Freq: Two times a day (BID) | NASAL | 12 refills | Status: DC

## 2020-09-28 MED ORDER — PREDNISONE 20 MG PO TABS: 20.0000 mg | ORAL_TABLET | Freq: Every day | ORAL | 0 refills | Status: DC

## 2020-09-28 MED ORDER — LEVOFLOXACIN 500 MG PO TABS: 500.0000 mg | ORAL_TABLET | Freq: Every day | ORAL | 0 refills | Status: AC

## 2020-09-28 NOTE — Patient Instructions (Addendum)
Renee Flores -  Sorry you're not feeling well   Levaquin will likely help - slight risk of tendon rupture, so take it easy for the next week or two.  Prednisone and azelastine can help with inflammation and allergy components of this. These should be tolerated well but let me know if you have side effects. Prednisone can have a modest impact on blood sugars and blood pressure, so keep an eye on those. At a lower dose like this, the impact likely won't be too bad.  Let me know if things get worse or fail to improve. We can consider getting you in to see a specialist if that's the case.  Thank you  Rich     If you have lab work done today you will be contacted with your lab results within the next 2 weeks.  If you have not heard from [place] then please contact us. The fastest way to get your results is to register for My Chart.   IF you received an x-ray today, you will receive an invoice from [facility]. Please contact [facility] at [phone] with questions or concerns regarding your invoice.   IF you received labwork today, you will receive an invoice from [facility]. Please contact [facility] at [phone] with questions or concerns regarding your invoice.   Our billing staff will not be able to assist you with questions regarding bills from these companies.  You will be contacted with the lab results as soon as they are available. The fastest way to get your results is to activate your My Chart account. Instructions are located on the last page of this paperwork. If you have not heard from [place] regarding the results in 2 weeks, please contact this office.

## 2020-09-28 NOTE — Progress Notes (Signed)
Acute Office Visit  Subjective:    Patient ID: Renee Flores, female    DOB: 1965-12-04, 55 y.o.   MRN: [id number]  Chief Complaint  Patient presents with  . Ear Pain    Patient states for the last 3-4 days she has been having some right ear pain. Patient states it makes her eyes hurt , dizzy and just unbalanced. Patient states she has put sweet oil in this ear but no relief.    HPI Patient is in today for ear pain  R side only Abrupt onset No drainage No changes to hearing Does feel like it messes with equilibrium   Some ongoing sinus pressure from URI last month Was treated with amoxicillin which temporarily improved symptoms  Longstanding hx of allergies. Uses flonase and singulair  Past Medical History:  Diagnosis Date  . Allergy   . Arthritis   . Bulging lumbar disc    L4-5  . Complication of anesthesia    anxiety attack after waking up from anesthesia  . Factor 5 Leiden mutation, heterozygous ([facility] [facility])   . Factor V deficiency, congenital ([place]) 03/23/2012  . GERD (gastroesophageal reflux disease)   . Heart murmur   . Heart palpitations   . Heart valve problem    mild- moderate mitral valve leakage   . High cholesterol   . HYPERCHOLESTEROLEMIA 02/27/2010   Qualifier: Diagnosis of  By: [name], Langley Gauss    . Hypersomnia with sleep apnea, unspecified 11/03/2013  . Hypertension   . Lumbar radiculopathy 08/12/2018  . MS (multiple sclerosis) ([facility])    questionable  . Neuromuscular disorder ([place])   . OSA on CPAP 03/25/2017  . Osteopenia 04/2016   T score -1.9  . Osteoporosis   . Preop cardiovascular exam 03/23/2012  . Retrognathia 11/03/2013  . Right knee DJD 07/08/2012  . Severe obesity (BMI >= 40) ([place]) 11/03/2013  . Shortness of breath 06/03/2013  . Sleep apnea   . Vitamin D deficiency 2014   17    Past Surgical History:  Procedure Laterality Date  . ABDOMINAL HYSTERECTOMY    . APPENDECTOMY    . arthroscopic knee surgery    . CESAREAN SECTION     x2  .  chloecystectomy    . CHOLECYSTECTOMY    . ESOPHAGEAL DILATION  04/09/2012  . KNEE LIGAMENT RECONSTRUCTION    . laser throat polyps    . NECK LESION BIOPSY    . PATELLA FRACTURE SURGERY    . PELVIC LAPAROSCOPY     LSO  . REPLACEMENT TOTAL KNEE Left 2012  . REPLACEMENT TOTAL KNEE Right 2014  . TOE SURGERY    . TONSILLECTOMY    . TONSILLECTOMY  1979  . TOTAL ABDOMINAL HYSTERECTOMY W/ BILATERAL SALPINGOOPHORECTOMY  2000  . TOTAL KNEE ARTHROPLASTY Left 2012  . TOTAL KNEE ARTHROPLASTY Right 07/08/2012   Procedure: TOTAL KNEE ARTHROPLASTY with revision components ;  Surgeon: Hessie Dibble, MD;  Location: [facility];  Service: Orthopedics;  Laterality: Right;  PATIENT HAS HX. OF FACTOR IV DISORFER  . TUBAL LIGATION      Family History  Problem Relation Age of Onset  . Heart disease Father   . Hypertension Father   . Hyperlipidemia Father   . Cancer Father   . Other Father        Respiratory problems/Factor 5 disorder/1 bypass, 2 valve replacement (mitral and aortic)   . Hypertension Mother   . Diabetes Mother   . Hyperlipidemia Mother   . Hypertension  Sister   . Colon cancer Paternal Aunt   . Breast cancer Paternal Aunt 79  . Heart disease Maternal Grandfather   . Lymphoma Maternal Grandfather   . Heart failure Maternal Grandfather   . Colon cancer Paternal Grandmother   . Hyperlipidemia Brother   . Hypertension Brother   . Hypertension Brother   . Colon cancer Paternal Aunt   . Breast cancer Paternal Aunt 64  . Stroke Paternal Grandfather     Social History   Socioeconomic History  . Marital status: Widowed    Spouse name: Jenny Reichmann  . Number of children: 2  . Years of education: 57  . Highest education level: Not on file  Occupational History    Employer: [employer]  Tobacco Use  . Smoking status: Never Smoker  . Smokeless tobacco: Never Used  Substance and Sexual Activity  . Alcohol use: Not Currently    Alcohol/week: 0.0 standard drinks  . Drug use: No  .  Sexual activity: Yes    Birth control/protection: Surgical    Comment: HYST-1st intercourse 55 yo-Fewer than 5 partners  Other Topics Concern  . Not on file  Social History Narrative   [name]  and lives at home with her husband.   Patient has two children.   Patient works as a Mudlogger for [employer].   Patient drinks one cup of caffeine daily.   Patient is right-handed.   Patient has a high school education.   Social Determinants of Health   Financial Resource Strain: Low Risk   . Difficulty of Paying Living Expenses: Not hard at all  Food Insecurity: No Food Insecurity  . Worried About Charity fundraiser in the Last Year: Never true  . Ran Out of Food in the Last Year: Never true  Transportation Needs: No Transportation Needs  . Lack of Transportation (Medical): No  . Lack of Transportation (Non-Medical): No  Physical Activity: Insufficiently Active  . Days of Exercise per Week: 7 days  . Minutes of Exercise per Session: 10 min  Stress: No Stress Concern Present  . Feeling of Stress : Not at all  Social Connections: Moderately Integrated  . Frequency of Communication with Friends and Family: More than three times a week  . Frequency of Social Gatherings with Friends and Family: More than three times a week  . Attends Religious Services: More than 4 times per year  . Active Member of Clubs or Organizations: Yes  . Attends Archivist Meetings: 1 to 4 times per year  . Marital Status: Widowed  Intimate Partner Violence: Not At Risk  . Fear of Current or Ex-Partner: No  . Emotionally Abused: No  . Physically Abused: No  . Sexually Abused: No    Outpatient Medications Prior to Visit  Medication Sig Dispense Refill  . aspirin EC 81 MG tablet Take 81 mg by mouth daily.    . cloNIDine (CATAPRES) 0.1 MG tablet TAKE 1 TABLET THREE TIMES DAILY (Patient taking differently: Take 0.1 mg by mouth 3 (three) times daily.) 180 tablet 0  .  fluticasone (FLONASE) 50 MCG/ACT nasal spray Place 2 sprays into both nostrils daily. 16 g 6  . gabapentin (NEURONTIN) 600 MG tablet TAKE 1 TABLET AT BEDTIME (Patient taking differently: Take 600 mg by mouth at bedtime.) 90 tablet 0  . hydrochlorothiazide (HYDRODIURIL) 25 MG tablet Take 1 tablet (25 mg total) by mouth daily. 90 tablet 0  . ketotifen (ZADITOR) 0.025 %  ophthalmic solution Place 2 drops into both eyes daily as needed (for irritation). 5 mL 0  . losartan (COZAAR) 50 MG tablet Take 1 tablet (50 mg total) by mouth daily. 90 tablet 0  . meloxicam (MOBIC) 15 MG tablet TAKE 1 TABLET EVERY DAY (Patient taking differently: Take 15 mg by mouth daily.) 90 tablet 0  . metoprolol succinate (TOPROL-XL) 25 MG 24 hr tablet TAKE 1 TABLET EVERY DAY (Patient taking differently: Take 25 mg by mouth daily.) 90 tablet 0  . montelukast (SINGULAIR) 10 MG tablet TAKE 1 TABLET AT BEDTIME (Patient taking differently: Take 10 mg by mouth at bedtime.) 90 tablet 0  . Multiple Vitamins-Minerals (ONE-A-DAY WOMENS 50+ ADVANTAGE PO) Take 1 tablet by mouth daily. Unknown strength    . omeprazole (PRILOSEC) 40 MG capsule TAKE 1 CAPSULE EVERY DAY (Patient taking differently: Take 40 mg by mouth daily.) 90 capsule 0  . simvastatin (ZOCOR) 40 MG tablet TAKE 1 TABLET AT BEDTIME (Patient taking differently: Take 40 mg by mouth daily at 6 PM.) 90 tablet 0  . Vitamin D, Ergocalciferol, (DRISDOL) 1.25 MG (50000 UNIT) CAPS capsule Take 1 capsule (50,000 Units total) by mouth every 7 (seven) days for 12 doses. 12 capsule 0   No facility-administered medications prior to visit.    Allergies  Allergen Reactions  . Erythromycin Base Hives  . Morphine Other (See Comments)    Hallucinating   . Vancomycin Hcl Other (See Comments)    Ringing in ears  . Doxycycline Nausea Only and Rash    Review of Systems Per hpi, otherwise negative    Objective:    Physical Exam Vitals and nursing note reviewed.  Constitutional:       General: She is not in acute distress.    Appearance: Normal appearance. She is not ill-appearing, toxic-appearing or diaphoretic.  HENT:     Right Ear: Hearing, ear canal and external ear normal. A middle ear effusion is present. Tympanic membrane is bulging.     Left Ear: Hearing, tympanic membrane, ear canal and external ear normal.  Cardiovascular:     Rate and Rhythm: Normal rate and regular rhythm.     Pulses: Normal pulses.     Heart sounds: Normal heart sounds. No murmur heard. No friction rub. No gallop.   Pulmonary:     Effort: Pulmonary effort is normal. No respiratory distress.     Breath sounds: Normal breath sounds. No stridor. No wheezing, rhonchi or rales.  Chest:     Chest wall: No tenderness.  Skin:    General: Skin is warm and dry.     Capillary Refill: Capillary refill takes less than 2 seconds.  Neurological:     General: No focal deficit present.     Mental Status: She is alert and oriented to person, place, and time. Mental status is at baseline.  Psychiatric:        Mood and Affect: Mood normal.        Behavior: Behavior normal.        Thought Content: Thought content normal.        Judgment: Judgment normal.     BP 127/67   Pulse 87   Temp 98.2 F (36.8 C) (Temporal)   Resp 18   Ht 5\' 3"  (1.6 m)   Wt 268 lb 12.8 oz (121.9 kg)   SpO2 99%   BMI 47.62 kg/m  Wt Readings from Last 3 Encounters:  09/28/20 268 lb 12.8 oz (121.9 kg)  09/19/20 271  lb (122.9 kg)  09/11/20 268 lb (121.6 kg)    There are no preventive care reminders to display for this patient.  There are no preventive care reminders to display for this patient.   Lab Results  Component Value Date   TSH 1.57 09/11/2020   Lab Results  Component Value Date   WBC 7.0 09/11/2020   HGB 12.6 09/11/2020   HCT 37.9 09/11/2020   MCV 85.9 09/11/2020   PLT 268.0 09/11/2020   Lab Results  Component Value Date   NA 138 09/11/2020   K 4.0 09/11/2020   CO2 28 09/11/2020   GLUCOSE 125  (H) 09/11/2020   BUN 12 09/11/2020   CREATININE 0.73 09/11/2020   BILITOT 0.5 09/26/2020   ALKPHOS 86 09/26/2020   AST 48 (H) 09/26/2020   ALT 56 (H) 09/26/2020   PROT 6.9 09/26/2020   ALBUMIN 4.2 09/26/2020   CALCIUM 9.8 09/11/2020   GFR 93.62 09/11/2020   Lab Results  Component Value Date   CHOL 189 09/11/2020   Lab Results  Component Value Date   HDL 64.40 09/11/2020   No results found for: [facility] Lab Results  Component Value Date   TRIG 215.0 (H) 09/11/2020   Lab Results  Component Value Date   CHOLHDL 3 09/11/2020   Lab Results  Component Value Date   HGBA1C 6.1 09/12/2020       Assessment & Plan:   Problem List Items Addressed This Visit   None   Visit Diagnoses    Acute otitis media, unspecified otitis media type    -  Primary   Relevant Medications   levofloxacin (LEVAQUIN) 500 MG tablet   predniSONE (DELTASONE) 20 MG tablet   azelastine (ASTELIN) 0.1 % nasal spray       No orders of the defined types were placed in this encounter.  PLAN  Given allergies and recent amoxicillin use, will opt for levaquin  Prednisone and azelastine for supportive care  Return if worsening or failing to improve  Can consider ENT for persistent symptoms  Reviewed r/b/se of levaquin with patient  Patient encouraged to call clinic with any questions, comments, or concerns.   Maximiano Coss, NP

## 2020-10-02 ENCOUNTER — Telehealth (HOSPITAL_COMMUNITY): Payer: Self-pay | Admitting: *Deleted

## 2020-10-02 DIAGNOSIS — E7800 Pure hypercholesterolemia, unspecified: Secondary | ICD-10-CM | POA: Diagnosis not present

## 2020-10-02 DIAGNOSIS — I38 Endocarditis, valve unspecified: Secondary | ICD-10-CM | POA: Diagnosis not present

## 2020-10-02 DIAGNOSIS — R002 Palpitations: Secondary | ICD-10-CM | POA: Diagnosis not present

## 2020-10-02 NOTE — Addendum Note (Signed)
Addended by: [name] on: 10/02/2020 11:13 AM   Modules accepted: Orders

## 2020-10-02 NOTE — Telephone Encounter (Signed)
Left message on voicemail per DPR in reference to upcoming appointment scheduled on 10/08/20 at 10:30 with detailed instructions given per Myocardial Perfusion Study Information Sheet for the test. LM to arrive 15 minutes early, and that it is imperative to arrive on time for appointment to keep from having the test rescheduled. If you need to cancel or reschedule your appointment, please call the office within 24 hours of your appointment. Failure to do so may result in a cancellation of your appointment, and a $50 no show fee. Phone number given for call back for any questions.

## 2020-10-04 ENCOUNTER — Other Ambulatory Visit: Payer: Self-pay

## 2020-10-05 ENCOUNTER — Other Ambulatory Visit: Payer: Self-pay

## 2020-10-08 ENCOUNTER — Ambulatory Visit (HOSPITAL_COMMUNITY): Payer: Medicare HMO | Attending: Cardiology

## 2020-10-08 ENCOUNTER — Other Ambulatory Visit: Payer: Self-pay

## 2020-10-08 DIAGNOSIS — E7800 Pure hypercholesterolemia, unspecified: Secondary | ICD-10-CM

## 2020-10-08 DIAGNOSIS — I38 Endocarditis, valve unspecified: Secondary | ICD-10-CM | POA: Diagnosis not present

## 2020-10-08 DIAGNOSIS — R0789 Other chest pain: Secondary | ICD-10-CM

## 2020-10-08 MED ORDER — TECHNETIUM TC 99M TETROFOSMIN IV KIT
31.6000 | PACK | Freq: Once | INTRAVENOUS | Status: AC | PRN
Start: 2020-10-08 — End: 2020-10-08
  Administered 2020-10-08: 31.6 via INTRAVENOUS
  Filled 2020-10-08: qty 32

## 2020-10-08 MED ORDER — REGADENOSON 0.4 MG/5ML IV SOLN
0.4000 mg | Freq: Once | INTRAVENOUS | Status: AC
  Administered 2020-10-08: 0.4 mg via INTRAVENOUS

## 2020-10-09 ENCOUNTER — Ambulatory Visit (HOSPITAL_COMMUNITY): Payer: Medicare HMO | Attending: Cardiovascular Disease

## 2020-10-09 LAB — MYOCARDIAL PERFUSION IMAGING
LV dias vol: 89 mL (ref 46–106)
LV sys vol: 34 mL
Peak HR: 121 {beats}/min
Rest HR: 84 {beats}/min
SDS: 2
SRS: 0
SSS: 2
TID: 1.06

## 2020-10-09 MED ORDER — TECHNETIUM TC 99M TETROFOSMIN IV KIT
32.6000 | PACK | Freq: Once | INTRAVENOUS | Status: AC | PRN
  Administered 2020-10-09: 32.6 via INTRAVENOUS
  Filled 2020-10-09: qty 33

## 2020-10-10 DIAGNOSIS — M25672 Stiffness of left ankle, not elsewhere classified: Secondary | ICD-10-CM | POA: Diagnosis not present

## 2020-10-10 DIAGNOSIS — M25372 Other instability, left ankle: Secondary | ICD-10-CM | POA: Diagnosis not present

## 2020-10-16 ENCOUNTER — Ambulatory Visit: Payer: Medicare HMO | Admitting: Cardiology

## 2020-10-24 ENCOUNTER — Encounter: Payer: Self-pay | Admitting: *Deleted

## 2020-10-25 ENCOUNTER — Telehealth (INDEPENDENT_AMBULATORY_CARE_PROVIDER_SITE_OTHER): Payer: Medicare HMO | Admitting: Family Medicine

## 2020-10-25 DIAGNOSIS — H938X3 Other specified disorders of ear, bilateral: Secondary | ICD-10-CM | POA: Diagnosis not present

## 2020-10-25 DIAGNOSIS — R059 Cough, unspecified: Secondary | ICD-10-CM

## 2020-10-25 DIAGNOSIS — R0981 Nasal congestion: Secondary | ICD-10-CM | POA: Diagnosis not present

## 2020-10-25 MED ORDER — BENZONATATE 100 MG PO CAPS: 100.0000 mg | ORAL_CAPSULE | Freq: Three times a day (TID) | ORAL | 0 refills | Status: DC | PRN

## 2020-10-25 NOTE — Progress Notes (Signed)
Virtual Visit via Video Note  I connected with Renee Flores  on 10/25/20 at  6:20 PM EDT by a video enabled telemedicine application and verified that I am speaking with the correct person using two identifiers.  Location patient: home, [place] Location provider:work or home office Persons participating in the virtual visit: patient, provider, patient's mother  I discussed the limitations of evaluation and management by telemedicine and the availability of in person appointments. The patient expressed understanding and agreed to proceed.   HPI:  Acute telemedicine visit for : -Onset: a few days ago -Symptoms include: cough, nasal congestion, sore throat, ears feel full -Renee Flores had laryngitis recently, his father has "allergies" -Denies:CP, SOB, NVD, body aches, inability eat/drink/get out of bed -Pertinent past medical history: sinus issues about a month ago with ear issues about 3-4 weeks ago - was on 2 different antibiotics; seasonal allergies -Pertinent medication allergies: Allergies  Allergen Reactions   Erythromycin Base Hives   Morphine Other (See Comments)    Hallucinating    Vancomycin Hcl Other (See Comments)    Ringing in ears   Doxycycline Nausea Only and Rash  -COVID-19 vaccine status: 2 doses and booster  ROS: See pertinent positives and negatives per HPI.  Past Medical History:  Diagnosis Date   Allergy    Arthritis    Bulging lumbar disc    G2-8   Complication of anesthesia    anxiety attack after waking up from anesthesia   Factor 5 Leiden mutation, heterozygous ([facility])    Factor V deficiency, congenital ([place]) 03/23/2012   GERD (gastroesophageal reflux disease)    Heart murmur    Heart palpitations    Heart valve problem    mild- moderate mitral valve leakage    High cholesterol    HYPERCHOLESTEROLEMIA 02/27/2010   Qualifier: Diagnosis of  By: [name], Den[place]     Hypersomnia with sleep apnea, unspecified 11/03/2013   Hypertension    Lumbar radiculopathy  08/12/2018   MS (multiple sclerosis) ([facility])    questionable   Neuromuscular disorder ([facility])    OSA on CPAP 03/25/2017   Osteopenia 04/2016   T score -1.9   Osteoporosis    Preop cardiovascular exam 03/23/2012   Retrognathia 11/03/2013   Right knee DJD 07/08/2012   Severe obesity (BMI >= 40) ([place]) 11/03/2013   Shortness of breath 06/03/2013   Sleep apnea    Vitamin D deficiency 2014   17    Past Surgical History:  Procedure Laterality Date   ABDOMINAL HYSTERECTOMY     APPENDECTOMY     arthroscopic knee surgery     CESAREAN SECTION     x2   chloecystectomy     CHOLECYSTECTOMY     ESOPHAGEAL DILATION  04/09/2012   KNEE LIGAMENT RECONSTRUCTION     laser throat polyps     NECK LESION BIOPSY     PATELLA FRACTURE SURGERY     PELVIC LAPAROSCOPY     LSO   REPLACEMENT TOTAL KNEE Left 2012   REPLACEMENT TOTAL KNEE Right 2014   TOE SURGERY     TONSILLECTOMY     TONSILLECTOMY  1979   TOTAL ABDOMINAL HYSTERECTOMY W/ BILATERAL SALPINGOOPHORECTOMY  2000   TOTAL KNEE ARTHROPLASTY Left 2012   TOTAL KNEE ARTHROPLASTY Right 07/08/2012   Procedure: TOTAL KNEE ARTHROPLASTY with revision components ;  Surgeon: Hessie Dibble, MD;  Location: [place];  Service: Orthopedics;  Laterality: Right;  PATIENT HAS HX. OF FACTOR IV DISORFER   TUBAL LIGATION  Current Outpatient Medications:    benzonatate (TESSALON PERLES) 100 MG capsule, Take 1 capsule (100 mg total) by mouth 3 (three) times daily as needed., Disp: 20 capsule, Rfl: 0   aspirin EC 81 MG tablet, Take 81 mg by mouth daily., Disp: , Rfl:    azelastine (ASTELIN) 0.1 % nasal spray, Place 1 spray into both nostrils 2 (two) times daily. Use in each nostril as directed, Disp: 30 mL, Rfl: 12   cloNIDine (CATAPRES) 0.1 MG tablet, TAKE 1 TABLET THREE TIMES DAILY (Patient taking differently: Take 0.1 mg by mouth 3 (three) times daily.), Disp: 180 tablet, Rfl: 0   fluticasone (FLONASE) 50 MCG/ACT nasal spray, Place 2 sprays into both nostrils daily.,  Disp: 16 g, Rfl: 6   gabapentin (NEURONTIN) 600 MG tablet, TAKE 1 TABLET AT BEDTIME (Patient taking differently: Take 600 mg by mouth at bedtime.), Disp: 90 tablet, Rfl: 0   hydrochlorothiazide (HYDRODIURIL) 25 MG tablet, Take 1 tablet (25 mg total) by mouth daily., Disp: 90 tablet, Rfl: 0   ketotifen (ZADITOR) 0.025 % ophthalmic solution, Place 2 drops into both eyes daily as needed (for irritation)., Disp: 5 mL, Rfl: 0   losartan (COZAAR) 50 MG tablet, Take 1 tablet (50 mg total) by mouth daily., Disp: 90 tablet, Rfl: 0   meloxicam (MOBIC) 15 MG tablet, TAKE 1 TABLET EVERY DAY (Patient taking differently: Take 15 mg by mouth daily.), Disp: 90 tablet, Rfl: 0   metoprolol succinate (TOPROL-XL) 25 MG 24 hr tablet, TAKE 1 TABLET EVERY DAY (Patient taking differently: Take 25 mg by mouth daily.), Disp: 90 tablet, Rfl: 0   montelukast (SINGULAIR) 10 MG tablet, TAKE 1 TABLET AT BEDTIME (Patient taking differently: Take 10 mg by mouth at bedtime.), Disp: 90 tablet, Rfl: 0   Multiple Vitamins-Minerals (ONE-A-DAY WOMENS 50+ ADVANTAGE PO), Take 1 tablet by mouth daily. Unknown strength, Disp: , Rfl:    omeprazole (PRILOSEC) 40 MG capsule, TAKE 1 CAPSULE EVERY DAY (Patient taking differently: Take 40 mg by mouth daily.), Disp: 90 capsule, Rfl: 0   predniSONE (DELTASONE) 20 MG tablet, Take 1 tablet (20 mg total) by mouth daily with breakfast., Disp: 4 tablet, Rfl: 0   simvastatin (ZOCOR) 40 MG tablet, TAKE 1 TABLET AT BEDTIME (Patient taking differently: Take 40 mg by mouth daily at 6 PM.), Disp: 90 tablet, Rfl: 0   Vitamin D, Ergocalciferol, (DRISDOL) 1.25 MG (50000 UNIT) CAPS capsule, Take 1 capsule (50,000 Units total) by mouth every 7 (seven) days for 12 doses., Disp: 12 capsule, Rfl: 0  EXAM:  VITALS per patient if applicable:  GENERAL: alert, oriented, appears well and in no acute distress  HEENT: atraumatic, conjunttiva clear, no obvious abnormalities on inspection of external nose and ears  NECK:  normal movements of the head and neck  LUNGS: on inspection no signs of respiratory distress, breathing rate appears normal, no obvious gross SOB, gasping or wheezing  CV: no obvious cyanosis  MS: moves all visible extremities without noticeable abnormality  PSYCH/NEURO: pleasant and cooperative, no obvious depression or anxiety, speech and thought processing grossly intact  ASSESSMENT AND PLAN:  Discussed the following assessment and plan:  Nasal congestion  Sensation of fullness in both ears  Cough  -we discussed possible serious and likely etiologies, options for evaluation and workup, limitations of telemedicine visit vs in person visit, treatment, treatment risks and precautions. Pt prefers to treat via telemedicine empirically rather than in person at this moment. Query new VURI, COVID19, ongoing allergy/ETD vs other. Opted  for covid testing which she agrees to do at home, add antihistamine Allegra once daily, nasal saline twice daily along with Renee other allergy medications, Tessalon for cough. Advised prompt video visit follow up with pcp or [facility] if covid test is positive. O/w advise 1 -2 week follow up with PCP. May need referral to allergy and/or ENT by PCP if persistent issues. Scheduled follow up with PCP offered: patient agrees to schedule follow up Advised to seek prompt in person care if worsening, new symptoms arise, or if is not improving with treatment. Discussed options for inperson care if PCP office not available. Did let this patient know that I only do telemedicine on Tuesdays and Thursdays for [facility]. Advised to schedule follow up visit with PCP or [facility] if any further questions or concerns to avoid delays in care.   I discussed the assessment and treatment plan with the patient. The patient was provided an opportunity to ask questions and all were answered. The patient agreed with the plan and demonstrated an understanding of the instructions.     Lucretia Kern, DO

## 2020-10-25 NOTE — Patient Instructions (Signed)
-  check a covid test (if using home tests check 2 tests 24-36 hours apart if 1st test is negative). If you have a positive test schedule a prompt follow up video visit with your primary care office or through [facility] to discuss treatment options. Treatment is best given early in the course of the illness.   -allegra once daily for 2 weeks  -nasal saline twice daily  -continue your flonase and singulair  -I sent the medication(s) we discussed to your pharmacy: Meds ordered this encounter  Medications   benzonatate (TESSALON PERLES) 100 MG capsule    Sig: Take 1 capsule (100 mg total) by mouth 3 (three) times daily as needed.    Dispense:  20 capsule    Refill:  0   -Schedule an inperson follow up with your doctor in 1-2 weeks  I hope you are feeling better soon!  Seek in person care promptly if your symptoms worsen, new concerns arise or you are not improving with treatment.  It was nice to meet you today. I help [facility] out with telemedicine visits on Tuesdays and Thursdays and am available for visits on those days. If you have any concerns or questions following this visit please schedule a follow up visit with your Primary Care doctor or seek care at a local urgent care clinic to avoid delays in care.

## 2020-10-28 ENCOUNTER — Other Ambulatory Visit: Payer: Self-pay

## 2020-10-28 ENCOUNTER — Ambulatory Visit (HOSPITAL_COMMUNITY)
Admission: EM | Admit: 2020-10-28 | Discharge: 2020-10-28 | Disposition: A | Discharge status: Home / Self Care | Payer: Medicare HMO | Attending: Physician Assistant | Admitting: Physician Assistant

## 2020-10-28 ENCOUNTER — Encounter (HOSPITAL_COMMUNITY): Payer: Self-pay | Admitting: Physician Assistant

## 2020-10-28 DIAGNOSIS — J329 Chronic sinusitis, unspecified: Secondary | ICD-10-CM | POA: Diagnosis not present

## 2020-10-28 DIAGNOSIS — J40 Bronchitis, not specified as acute or chronic: Secondary | ICD-10-CM

## 2020-10-28 DIAGNOSIS — R059 Cough, unspecified: Secondary | ICD-10-CM

## 2020-10-28 MED ORDER — CEFDINIR 300 MG PO CAPS: 300.0000 mg | ORAL_CAPSULE | Freq: Two times a day (BID) | ORAL | 0 refills | Status: DC

## 2020-10-28 MED ORDER — PROMETHAZINE-DM 6.25-15 MG/5ML PO SYRP: 5.0000 mL | ORAL_SOLUTION | Freq: Two times a day (BID) | ORAL | 0 refills | Status: DC | PRN

## 2020-10-28 NOTE — ED Provider Notes (Signed)
[place]    CSN: [id number] Arrival date & time: 10/28/20  1135      History   Chief Complaint Chief Complaint  Patient presents with   Facial Pain   Cough    HPI Renee Flores is a 55 y.o. female.   Patient presents today with a several week history of sinus symptoms.  Reports she has had intermittent episodes for the past several weeks.  She was prescribed Augmentin in April and had temporary improvement of symptoms.  Should recurrent symptoms approximately 1 month ago and was treated with prednisone and Levaquin which improved but not resolved symptoms.  She had a virtual visit and was given [employer] which provided no relief of symptoms.  She has been using over-the-counter medications including Mucinex DM, antihistamines, nasal spray without improvement.  Reports household sick contacts with similar symptoms though their symptoms have improved.  She is up-to-date on immunizations including COVID-19 vaccination.  She did take an at-home COVID test that was negative.   Past Medical History:  Diagnosis Date   Allergy    Arthritis    Bulging lumbar disc    S4-9   Complication of anesthesia    anxiety attack after waking up from anesthesia   Factor 5 Leiden mutation, heterozygous ([place])    Factor V deficiency, congenital ([place]) 03/23/2012   GERD (gastroesophageal reflux disease)    Heart murmur    Heart palpitations    Heart valve problem    mild- moderate mitral valve leakage    High cholesterol    HYPERCHOLESTEROLEMIA 02/27/2010   Qualifier: Diagnosis of  By: [name], Denise     Hypersomnia with sleep apnea, unspecified 11/03/2013   Hypertension    Lumbar radiculopathy 08/12/2018   MS (multiple sclerosis) ([place])    questionable   Neuromuscular disorder (HCC)    OSA on CPAP 03/25/2017   Osteopenia 04/2016   T score -1.9   Osteoporosis    Preop cardiovascular exam 03/23/2012   Retrognathia 11/03/2013   Right knee DJD 07/08/2012   Severe obesity  (BMI >= 40) ([place]) 11/03/2013   Shortness of breath 06/03/2013   Sleep apnea    Vitamin D deficiency 2014   17    Patient Active Problem List   Diagnosis Date Noted   Atypical chest pain 09/19/2020   Sleep apnea    High cholesterol    Palpitations    Osteoporosis    Neuromuscular disorder ([place])    MS (multiple sclerosis) (HCC)    Heart valve problem    Heart murmur    GERD (gastroesophageal reflux disease)    Factor 5 Leiden mutation, heterozygous ([place])    Complication of anesthesia    Bulging lumbar disc    Arthritis    Allergy    Hypertension 04/13/2020   Lumbar radiculopathy 08/12/2018   OSA on CPAP 03/25/2017   Hypersomnia with sleep apnea, unspecified 11/03/2013   Severe obesity (BMI >= 40) ([place]) 11/03/2013   Retrognathia 11/03/2013   Shortness of breath 06/03/2013   Right knee DJD 07/08/2012    Class: Chronic   Physical exam 03/23/2012   Preop cardiovascular exam 03/23/2012   Factor V deficiency, congenital ([place]) 03/23/2012   Osteopenia    Vitamin D deficiency    HYPERCHOLESTEROLEMIA 02/27/2010    Past Surgical History:  Procedure Laterality Date   ABDOMINAL HYSTERECTOMY     APPENDECTOMY     arthroscopic knee surgery     CESAREAN SECTION     x2  chloecystectomy     CHOLECYSTECTOMY     ESOPHAGEAL DILATION  04/09/2012   KNEE LIGAMENT RECONSTRUCTION     laser throat polyps     NECK LESION BIOPSY     PATELLA FRACTURE SURGERY     PELVIC LAPAROSCOPY     LSO   REPLACEMENT TOTAL KNEE Left 2012   REPLACEMENT TOTAL KNEE Right 2014   TOE SURGERY     TONSILLECTOMY     TONSILLECTOMY  1979   TOTAL ABDOMINAL HYSTERECTOMY W/ BILATERAL SALPINGOOPHORECTOMY  2000   TOTAL KNEE ARTHROPLASTY Left 2012   TOTAL KNEE ARTHROPLASTY Right 07/08/2012   Procedure: TOTAL KNEE ARTHROPLASTY with revision components ;  Surgeon: Hessie Dibble, MD;  Location: [facility];  Service: Orthopedics;  Laterality: Right;  PATIENT HAS HX. OF FACTOR IV DISORFER   TUBAL LIGATION      OB History      Gravida  2   Para  2   Term  2   Preterm      AB      Living  2      SAB      IAB      Ectopic      Multiple      Live Births               Home Medications    Prior to Admission medications   Medication Sig Start Date End Date Taking? Authorizing Provider  cefdinir (OMNICEF) 300 MG capsule Take 1 capsule (300 mg total) by mouth 2 (two) times daily. 10/28/20  Yes Gifford Ballon K, PA-C  promethazine-dextromethorphan (PROMETHAZINE-DM) 6.25-15 MG/5ML syrup Take 5 mLs by mouth 2 (two) times daily as needed for cough. 10/28/20  Yes Camellia Popescu, Derry Skill, PA-C  aspirin EC 81 MG tablet Take 81 mg by mouth daily.    [provider]  azelastine (ASTELIN) 0.1 % nasal spray Place 1 spray into both nostrils 2 (two) times daily. Use in each nostril as directed 09/28/20   Maximiano Coss, NP  benzonatate (TESSALON PERLES) 100 MG capsule Take 1 capsule (100 mg total) by mouth 3 (three) times daily as needed. 10/25/20   Lucretia Kern, DO  cloNIDine (CATAPRES) 0.1 MG tablet TAKE 1 TABLET THREE TIMES DAILY Patient taking differently: Take 0.1 mg by mouth 3 (three) times daily. 09/17/20   [name], MD  fluticasone (FLONASE) 50 MCG/ACT nasal spray Place 2 sprays into both nostrils daily. 08/06/20   [name], MD  gabapentin (NEURONTIN) 600 MG tablet TAKE 1 TABLET AT BEDTIME Patient taking differently: Take 600 mg by mouth at bedtime. 09/17/20   [name], MD  hydrochlorothiazide (HYDRODIURIL) 25 MG tablet Take 1 tablet (25 mg total) by mouth daily. 04/16/20   [name], MD  ketotifen (ZADITOR) 0.025 % ophthalmic solution Place 2 drops into both eyes daily as needed (for irritation). 04/16/20   [name], MD  losartan (COZAAR) 50 MG tablet Take 1 tablet (50 mg total) by mouth daily. 06/26/20   [name], MD  meloxicam (MOBIC) 15 MG tablet TAKE 1 TABLET EVERY DAY Patient taking differently: Take 15 mg by mouth daily. 09/18/20   [name], MD  metoprolol succinate (TOPROL-XL) 25 MG 24 hr tablet TAKE 1 TABLET EVERY DAY Patient taking differently: Take 25 mg by mouth daily. 09/17/20   [name], MD  montelukast (SINGULAIR) 10 MG tablet TAKE 1 TABLET AT BEDTIME Patient taking differently: Take 10 mg  by mouth at bedtime. 09/17/20   [name], MD  Multiple Vitamins-Minerals (ONE-A-DAY WOMENS 50+ ADVANTAGE PO) Take 1 tablet by mouth daily. Unknown strength    [provider]  omeprazole (PRILOSEC) 40 MG capsule TAKE 1 CAPSULE EVERY DAY Patient taking differently: Take 40 mg by mouth daily. 08/17/20   [name], MD  predniSONE (DELTASONE) 20 MG tablet Take 1 tablet (20 mg total) by mouth daily with breakfast. 09/28/20   Maximiano Coss, NP  simvastatin (ZOCOR) 40 MG tablet TAKE 1 TABLET AT BEDTIME Patient taking differently: Take 40 mg by mouth daily at 6 PM. 09/17/20   [name], MD  Vitamin D, Ergocalciferol, (DRISDOL) 1.25 MG (50000 UNIT) CAPS capsule Take 1 capsule (50,000 Units total) by mouth every 7 (seven) days for 12 doses. 09/12/20 11/29/20  [name], MD    Family History Family History  Problem Relation Age of Onset   Heart disease Father    Hypertension Father    Hyperlipidemia Father    Cancer Father    Other Father        Respiratory problems/Factor 5 disorder/1 bypass, 2 valve replacement (mitral and aortic)    Hypertension Mother    Diabetes Mother    Hyperlipidemia Mother    Hypertension Sister    Colon cancer Paternal Aunt    Breast cancer Paternal Aunt 67   Heart disease Maternal Grandfather    Lymphoma Maternal Grandfather    Heart failure Maternal Grandfather    Colon cancer Paternal Grandmother    Hyperlipidemia Brother    Hypertension Brother    Hypertension Brother    Colon cancer Paternal Aunt    Breast cancer Paternal Aunt 75   Stroke Paternal Grandfather     Social History Social History   Tobacco Use   Smoking status: Never    Smokeless tobacco: Never  Substance Use Topics   Alcohol use: Not Currently    Alcohol/week: 0.0 standard drinks   Drug use: No     Allergies   Erythromycin base, Morphine, Vancomycin hcl, and Doxycycline   Review of Systems Review of Systems  Constitutional:  Positive for activity change and fatigue. Negative for appetite change and fever.  HENT:  Positive for congestion, postnasal drip, sinus pressure and sore throat. Negative for sneezing.   Respiratory:  Positive for cough and shortness of breath.   Cardiovascular:  Negative for chest pain.  Gastrointestinal:  Negative for abdominal pain, diarrhea, nausea and vomiting.  Musculoskeletal:  Negative for arthralgias and myalgias.  Neurological:  Positive for headaches. Negative for dizziness and light-headedness.    Physical Exam Triage Vital Signs ED Triage Vitals  Enc Vitals Group     BP 10/28/20 1245 (!) 145/101     Pulse Rate 10/28/20 1245 73     Resp 10/28/20 1245 18     Temp 10/28/20 1245 98.7 F (37.1 C)     Temp src --      SpO2 10/28/20 1245 97 %     Weight --      Height --      Head Circumference --      Peak Flow --      Pain Score 10/28/20 1242 3     Pain Loc --      Pain Edu? --      Excl. in [place]? --    No data found.  Updated Vital Signs BP (!) 145/101   Pulse 73   Temp 98.7 F (37.1  C)   Resp 18   SpO2 97%   Visual Acuity Right Eye Distance:   Left Eye Distance:   Bilateral Distance:    Right Eye Near:   Left Eye Near:    Bilateral Near:     Physical Exam Vitals reviewed.  Constitutional:      General: She is awake. She is not in acute distress.    Appearance: Normal appearance. She is normal weight. She is not ill-appearing.     Comments: Very pleasant female appears stated age in no acute distress  HENT:     Head: Normocephalic and atraumatic.     Right Ear: Tympanic membrane, ear canal and external ear normal. Tympanic membrane is not erythematous or bulging.     Left Ear:  Tympanic membrane, ear canal and external ear normal. Tympanic membrane is not erythematous or bulging.     Nose:     Right Sinus: Maxillary sinus tenderness and frontal sinus tenderness present.     Left Sinus: Maxillary sinus tenderness and frontal sinus tenderness present.     Mouth/Throat:     Pharynx: Uvula midline. Posterior oropharyngeal erythema present. No oropharyngeal exudate.     Comments: Moderate erythema and drainage in posterior oropharynx Cardiovascular:     Rate and Rhythm: Normal rate and regular rhythm.     Heart sounds: Normal heart sounds, S1 normal and S2 normal. No murmur heard. Pulmonary:     Effort: Pulmonary effort is normal.     Breath sounds: Normal breath sounds. No wheezing, rhonchi or rales.     Comments: Clear to auscultation bilaterally Lymphadenopathy:     Head:     Right side of head: No submental, submandibular or tonsillar adenopathy.     Left side of head: No submental, submandibular or tonsillar adenopathy.     Cervical: No cervical adenopathy.  Psychiatric:        Behavior: Behavior is cooperative.     UC Treatments / Results  Labs (all labs ordered are listed, but only abnormal results are displayed) Labs Reviewed - No data to display  EKG   Radiology No results found.  Procedures Procedures (including critical care time)  Medications Ordered in UC Medications - No data to display  Initial Impression / Assessment and Plan / UC Course  I have reviewed the triage vital signs and the nursing notes.  Pertinent labs & imaging results that were available during my care of the patient were reviewed by me and considered in my medical decision making (see chart for details).      Patient started on antibiotics given prolonged and worsening symptoms.  Given recent antibiotics and patient's allergies will use cefdinir.  Patient was prescribed Promethazine DM with instruction not to drive or drink alcohol with this medication as  drowsiness is a common side effect.  Recommended she continue over-the-counter medications including Mucinex and Flonase for symptom relief.  Recommended she rest and drink plenty of fluid.  Recommended she follow-up with PCP within 1 week to ensure symptom improvement.  Discussed alarm symptoms that warrant emergent evaluation.  Strict return precautions given to which patient expressed understanding.  Final Clinical Impressions(s) / UC Diagnoses   Final diagnoses:  Sinobronchitis  Cough     Discharge Instructions      Take Omnicef to (cefdinir) twice daily for 10 days to cover for sinus/bronchitis infection.  Use Promethazine DM up to twice daily as needed for cough and congestion.  This will make you sleepy so do  not drive or drink alcohol with it.  Use over-the-counter medications including Mucinex and Flonase for symptom relief.  Make sure you are drinking plenty of fluid.  If you have any worsening symptoms including severe cough, fever, weakness, nausea, vomiting you need to go to the emergency room.  Follow-up with your primary care provider within 1 week to ensure symptom improvement.     ED Prescriptions     Medication Sig Dispense Auth. Provider   cefdinir (OMNICEF) 300 MG capsule Take 1 capsule (300 mg total) by mouth 2 (two) times daily. 20 capsule Tonji Elliff K, PA-C   promethazine-dextromethorphan (PROMETHAZINE-DM) 6.25-15 MG/5ML syrup Take 5 mLs by mouth 2 (two) times daily as needed for cough. 118 mL Dayyan Krist K, PA-C      PDMP not reviewed this encounter.   Terrilee Croak, PA-C 10/28/20 1332

## 2020-10-28 NOTE — ED Triage Notes (Signed)
Pt tested Neg with Home test for COVID . Pt had  a virtual  visit with PCP last . The meds given have not helped SX's.

## 2020-10-28 NOTE — Discharge Instructions (Addendum)
Take Omnicef to (cefdinir) twice daily for 10 days to cover for sinus/bronchitis infection.  Use Promethazine DM up to twice daily as needed for cough and congestion.  This will make you sleepy so do not drive or drink alcohol with it.  Use over-the-counter medications including Mucinex and Flonase for symptom relief.  Make sure you are drinking plenty of fluid.  If you have any worsening symptoms including severe cough, fever, weakness, nausea, vomiting you need to go to the emergency room.  Follow-up with your primary care provider within 1 week to ensure symptom improvement.

## 2020-12-07 ENCOUNTER — Other Ambulatory Visit: Payer: Self-pay | Admitting: Family Medicine

## 2020-12-07 DIAGNOSIS — I10 Essential (primary) hypertension: Secondary | ICD-10-CM

## 2020-12-21 DIAGNOSIS — G4733 Obstructive sleep apnea (adult) (pediatric): Secondary | ICD-10-CM | POA: Diagnosis not present

## 2020-12-25 ENCOUNTER — Telehealth: Payer: Self-pay | Admitting: *Deleted

## 2020-12-25 NOTE — Telephone Encounter (Signed)
We received a fax from [employer] stating patient has been set up on an [employer] 10 auto starting 12/21/2020.  Patient will need an appointment per insurance between 01/21/21 and 03/21/21.

## 2020-12-25 NOTE — Telephone Encounter (Signed)
F/u scheduled for 11/1 at 9:30 am.

## 2021-01-04 NOTE — Assessment & Plan Note (Signed)
Ongoing issue for pt.  She reports she was 220lbs when husband passed 2 yrs ago but due to stress and medical issues she has been overeating and not exercising.  She will start PT next week for her L ankle and once she is cleared by PT, she can resume exercising.  Encouraged her to eat a low carb diet and try and manage her stress eating.  Will follow closely.

## 2021-01-04 NOTE — Assessment & Plan Note (Signed)
Chronic problem.  Well controlled on Clonidine, HCTZ, Losartan, and Metoprolol.  Currently asymptomatic.  No med changes at this time but will continue to follow at future visits.

## 2021-01-09 ENCOUNTER — Other Ambulatory Visit: Payer: Self-pay | Admitting: Family Medicine

## 2021-01-09 DIAGNOSIS — K219 Gastro-esophageal reflux disease without esophagitis: Secondary | ICD-10-CM

## 2021-01-21 DIAGNOSIS — G4733 Obstructive sleep apnea (adult) (pediatric): Secondary | ICD-10-CM | POA: Diagnosis not present

## 2021-01-25 ENCOUNTER — Other Ambulatory Visit: Payer: Self-pay

## 2021-01-25 DIAGNOSIS — M1909 Primary osteoarthritis, other specified site: Secondary | ICD-10-CM | POA: Diagnosis not present

## 2021-01-25 NOTE — Assessment & Plan Note (Signed)
Pt is following w/ Dr Brett Fairy.  Waiting on new CPAP.  Regular use of CPAP should also improve BP

## 2021-01-25 NOTE — Assessment & Plan Note (Signed)
Chronic problem.  Today's BP is 140/82 on Losartan, Clonidine, Metoprolol, HCTZ.  She is not on max doses of multiple medications and I think we can simplify her regimen but I don't want to make changes during her first visit.  Will continue current meds for now and I will get baseline labs to have better information when making med changes.  Pt expressed understanding and is in agreement w/ plan.

## 2021-01-25 NOTE — Assessment & Plan Note (Signed)
Chronic problem.  On Simvastatin 40mg daily w/o difficulty.  Check labs.  Adjust meds prn  

## 2021-01-25 NOTE — Assessment & Plan Note (Signed)
Ongoing issue for pt.  BMI is 44.68  Encouraged low carb, heart healthy diet and regular physical activity.  Will check labs to risk stratify and continue to follow.

## 2021-01-25 NOTE — Assessment & Plan Note (Signed)
Ongoing issue for pt.  She has never had a clot and has been told in the past that ASA is sufficient.  Will follow

## 2021-01-29 ENCOUNTER — Ambulatory Visit: Payer: Medicare HMO | Admitting: Cardiology

## 2021-01-29 ENCOUNTER — Encounter: Payer: Self-pay | Admitting: Cardiology

## 2021-01-29 ENCOUNTER — Other Ambulatory Visit: Payer: Self-pay

## 2021-01-29 VITALS — BP 138/90 | HR 75 | Ht 63.0 in | Wt 275.0 lb

## 2021-01-29 DIAGNOSIS — R0789 Other chest pain: Secondary | ICD-10-CM | POA: Diagnosis not present

## 2021-01-29 DIAGNOSIS — E7800 Pure hypercholesterolemia, unspecified: Secondary | ICD-10-CM

## 2021-01-29 DIAGNOSIS — I270 Primary pulmonary hypertension: Secondary | ICD-10-CM

## 2021-01-29 DIAGNOSIS — I10 Essential (primary) hypertension: Secondary | ICD-10-CM | POA: Diagnosis not present

## 2021-01-29 DIAGNOSIS — R002 Palpitations: Secondary | ICD-10-CM

## 2021-01-29 MED ORDER — METOPROLOL SUCCINATE ER 50 MG PO TB24: 50.0000 mg | ORAL_TABLET | Freq: Every day | ORAL | 1 refills | Status: DC

## 2021-01-29 NOTE — Progress Notes (Signed)
Cardiology Office Note:    Date:  01/29/2021   ID:  Renee Flores, DOB 27-Aug-1965, MRN [id number]  PCP:  [name], MD  Cardiologist:  [name], MD    Referring MD: [name], MD   Chief Complaint  Patient presents with   Follow-up  Doing fine  History of Present Illness:    Renee Flores is a 55 y.o. female with past medical history significant for essential hypertension, dyslipidemia, trivial mitral regurgitation, palpitations, atypical chest pain.  Initially she was sent to [place] for evaluation before ankle surgery she did have surgery with no trouble.  She had a stress test showing no evidence of ischemia she did have a Zio patch which showed some PVCs as well as short episode of supraventricular tachycardia.  He comes today to my office.  She denies having any chest pain tightness squeezing pressure of her chest.  She described to have some palpitations however does have very minimal.  She actually does not want to do anything about it.  She is already on beta-blockers.  No dizziness no passing out.  Past Medical History:  Diagnosis Date   Allergy    Arthritis    Bulging lumbar disc    Y3-0   Complication of anesthesia    anxiety attack after waking up from anesthesia   Factor 5 Leiden mutation, heterozygous ([place])    Factor V deficiency, congenital ([place]) 03/23/2012   GERD (gastroesophageal reflux disease)    Heart murmur    Heart palpitations    Heart valve problem    mild- moderate mitral valve leakage    High cholesterol    HYPERCHOLESTEROLEMIA 02/27/2010   Qualifier: Diagnosis of  By: [name], Denise     Hypersomnia with sleep apnea, unspecified 11/03/2013   Hypertension    Lumbar radiculopathy 08/12/2018   MS (multiple sclerosis) (HCC)    questionable   Neuromuscular disorder (HCC)    OSA on CPAP 03/25/2017   Osteopenia 04/2016   T score -1.9   Osteoporosis    Preop cardiovascular exam 03/23/2012   Retrognathia 11/03/2013   Right  knee DJD 07/08/2012   Severe obesity (BMI >= 40) ([place]) 11/03/2013   Shortness of breath 06/03/2013   Sleep apnea    Vitamin D deficiency 2014   17    Past Surgical History:  Procedure Laterality Date   ABDOMINAL HYSTERECTOMY     APPENDECTOMY     arthroscopic knee surgery     CESAREAN SECTION     x2   chloecystectomy     CHOLECYSTECTOMY     ESOPHAGEAL DILATION  04/09/2012   KNEE LIGAMENT RECONSTRUCTION     laser throat polyps     Left ankle reconstructed     NECK LESION BIOPSY     PATELLA FRACTURE SURGERY     PELVIC LAPAROSCOPY     LSO   REPLACEMENT TOTAL KNEE Left 2012   REPLACEMENT TOTAL KNEE Right 2014   TOE SURGERY     TONSILLECTOMY     TONSILLECTOMY  1979   TOTAL ABDOMINAL HYSTERECTOMY W/ BILATERAL SALPINGOOPHORECTOMY  2000   TOTAL KNEE ARTHROPLASTY Left 2012   TOTAL KNEE ARTHROPLASTY Right 07/08/2012   Procedure: TOTAL KNEE ARTHROPLASTY with revision components ;  Surgeon: Hessie Dibble, MD;  Location: [facility];  Service: Orthopedics;  Laterality: Right;  PATIENT HAS HX. OF FACTOR IV DISORFER   TUBAL LIGATION      Current Medications: Current Meds  Medication Sig  aspirin EC 81 MG tablet Take 81 mg by mouth daily.   azelastine (ASTELIN) 0.1 % nasal spray Place 1 spray into both nostrils 2 (two) times daily. Use in each nostril as directed   cloNIDine (CATAPRES) 0.1 MG tablet TAKE 1 TABLET THREE TIMES DAILY (Patient taking differently: Take 0.1 mg by mouth 3 (three) times daily.)   fluticasone (FLONASE) 50 MCG/ACT nasal spray Place 2 sprays into both nostrils daily.   gabapentin (NEURONTIN) 600 MG tablet TAKE 1 TABLET AT BEDTIME (Patient taking differently: Take 600 mg by mouth at bedtime.)   hydrochlorothiazide (HYDRODIURIL) 25 MG tablet Take 1 tablet (25 mg total) by mouth daily.   ketotifen (ZADITOR) 0.025 % ophthalmic solution Place 2 drops into both eyes daily as needed (for irritation).   losartan (COZAAR) 50 MG tablet Take 1 tablet (50 mg total) by mouth daily.    meloxicam (MOBIC) 15 MG tablet TAKE 1 TABLET EVERY DAY (Patient taking differently: Take 15 mg by mouth daily.)   metoprolol succinate (TOPROL-XL) 25 MG 24 hr tablet TAKE 1 TABLET EVERY DAY (Patient taking differently: Take 25 mg by mouth daily.)   montelukast (SINGULAIR) 10 MG tablet TAKE 1 TABLET AT BEDTIME (Patient taking differently: Take 10 mg by mouth at bedtime.)   Multiple Vitamins-Minerals (ONE-A-DAY WOMENS 50+ ADVANTAGE PO) Take 1 tablet by mouth daily. Unknown strength   omeprazole (PRILOSEC) 40 MG capsule TAKE 1 CAPSULE EVERY DAY (Patient taking differently: Take 40 mg by mouth daily.)   promethazine-dextromethorphan (PROMETHAZINE-DM) 6.25-15 MG/5ML syrup Take 5 mLs by mouth 2 (two) times daily as needed for cough.   simvastatin (ZOCOR) 40 MG tablet TAKE 1 TABLET AT BEDTIME (Patient taking differently: Take 40 mg by mouth daily at 6 PM.)     Allergies:   Erythromycin base, Morphine, Vancomycin hcl, and Doxycycline   Social History   Socioeconomic History   Marital status: Widowed    Spouse name: [name]   Number of children: 2   Years of education: 12   Highest education level: Not on file  Occupational History    Employer: [employer]  Tobacco Use   Smoking status: Never   Smokeless tobacco: Never  Substance and Sexual Activity   Alcohol use: Not Currently    Alcohol/week: 0.0 standard drinks   Drug use: No   Sexual activity: Yes    Birth control/protection: Surgical    Comment: HYST-1st intercourse 55 yo-Fewer than 5 partners  Other Topics Concern   Not on file  Social History Narrative   [name]  and lives at home with her husband.   Patient has two children.   Patient works as a Mudlogger for [employer].   Patient drinks one cup of caffeine daily.   Patient is right-handed.   Patient has a high school education.   Social Determinants of Health   Financial Resource Strain: Low Risk    Difficulty of Paying Living  Expenses: Not hard at all  Food Insecurity: No Food Insecurity   Worried About Charity fundraiser in the Last Year: Never true   [place] in the Last Year: Never true  Transportation Needs: No Transportation Needs   Lack of Transportation (Medical): No   Lack of Transportation (Non-Medical): No  Physical Activity: Insufficiently Active   Days of Exercise per Week: 7 days   Minutes of Exercise per Session: 10 min  Stress: No Stress Concern Present   Feeling of Stress : Not  at all  Social Connections: Moderately Integrated   Frequency of Communication with Friends and Family: More than three times a week   Frequency of Social Gatherings with Friends and Family: More than three times a week   Attends Religious Services: More than 4 times per year   Active Member of [employer] or Organizations: Yes   Attends Archivist Meetings: 1 to 4 times per year   Marital Status: Widowed     Family History: The patient's family history includes Breast cancer (age of onset: 9) in her paternal aunt; Breast cancer (age of onset: 15) in her paternal aunt; Cancer in her father; Colon cancer in her paternal aunt, paternal aunt, and paternal grandmother; Diabetes in her mother; Heart disease in her father and maternal grandfather; Heart failure in her maternal grandfather; Hyperlipidemia in her brother, father, and mother; Hypertension in her brother, brother, father, mother, and sister; Lymphoma in her maternal grandfather; Other in her father; Stroke in her paternal grandfather. ROS:   Please see the history of present illness.    All 14 point review of systems negative except as described per history of present illness  EKGs/Labs/Other Studies Reviewed:      Recent Labs: 09/11/2020: Hemoglobin 12.6; Platelets 268.0; TSH 1.57 09/26/2020: ALT 56; BUN 13; Creatinine, Ser 0.79; Potassium 4.4; Sodium 143  Recent Lipid Panel    Component Value Date/Time   CHOL 189 09/11/2020 1037   TRIG 215.0  (H) 09/11/2020 1037   HDL 64.40 09/11/2020 1037   CHOLHDL 3 09/11/2020 1037   VLDL 43.0 (H) 09/11/2020 1037   LDLDIRECT 94.0 09/11/2020 1037    Physical Exam:    VS:  BP 138/90 (BP Location: Right Arm, Patient Position: Sitting)   Pulse 75   Ht 5\' 3"  (1.6 m)   Wt 275 lb (124.7 kg)   SpO2 96%   BMI 48.71 kg/m     Wt Readings from Last 3 Encounters:  01/29/21 275 lb (124.7 kg)  10/08/20 271 lb (122.9 kg)  09/28/20 268 lb 12.8 oz (121.9 kg)     GEN:  Well nourished, well developed in no acute distress HEENT: Normal NECK: No JVD; No carotid bruits LYMPHATICS: No lymphadenopathy CARDIAC: RRR, no murmurs, no rubs, no gallops RESPIRATORY:  Clear to auscultation without rales, wheezing or rhonchi  ABDOMEN: Soft, non-tender, non-distended MUSCULOSKELETAL:  No edema; No deformity  SKIN: Warm and dry LOWER EXTREMITIES: no swelling NEUROLOGIC:  Alert and oriented x 3 PSYCHIATRIC:  Normal affect   ASSESSMENT:    1. Primary hypertension   2. HYPERCHOLESTEROLEMIA   3. Palpitations   4. Atypical chest pain    PLAN:    In order of problems listed above:  Essential hypertension still appears to be uncontrolled I will increase dose of metoprolol and hoping by doing this will be able to help with the blood pressure as well as suppress palpitations completely.  Therefore we will go from 25 to 50 mg of metoprolol succinate daily. Dyslipidemia: She is on Zocor 40 which I will continue I did review K PN however show me only her HDL which was 64.  We will call primary care physician to get full data. Atypical chest pain denies having any I did review stress test with her which was negative we will continue present management with risk factors modifications   Medication Adjustments/Labs and Tests Ordered: Current medicines are reviewed at length with the patient today.  Concerns regarding medicines are outlined above.  No orders of the  defined types were placed in this  encounter.  Medication changes: No orders of the defined types were placed in this encounter.   Signed, Park Liter, MD, [facility] 01/29/2021 9:35 AM    [place]

## 2021-01-29 NOTE — Patient Instructions (Signed)
Medication Instructions:  Your physician has recommended you make the following change in your medication:  INCREASE: Metoprolol Succinate 50 mg daily  *If you need a refill on your cardiac medications before your next appointment, please call your pharmacy*   Lab Work: None If you have labs (blood work) drawn today and your tests are completely normal, you will receive your results only by: [place] (if you have MyChart) OR A paper copy in the mail If you have any lab test that is abnormal or we need to change your treatment, we will call you to review the results.   Testing/Procedures: None   Follow-Up: At [facility], you and your health needs are our priority.  As part of our continuing mission to provide you with exceptional heart care, we have created designated Provider Care Teams.  These Care Teams include your primary Cardiologist (physician) and Advanced Practice Providers (APPs -  Physician Assistants and Nurse Practitioners) who all work together to provide you with the care you need, when you need it.  We recommend signing up for the patient portal called "MyChart".  Sign up information is provided on this After Visit Summary.  MyChart is used to connect with patients for Virtual Visits (Telemedicine).  Patients are able to view lab/test results, encounter notes, upcoming appointments, etc.  Non-urgent messages can be sent to your provider as well.   To learn more about what you can do with MyChart, go to [email or web address].    Your next appointment:   6 month(s)  The format for your next appointment:   In Person  Provider:   [name], MD   Other Instructions

## 2021-01-29 NOTE — Addendum Note (Signed)
Addended by: Senaida Ores on: 01/29/2021 09:45 AM   Modules accepted: Orders

## 2021-02-07 ENCOUNTER — Other Ambulatory Visit: Payer: Self-pay | Admitting: Family Medicine

## 2021-02-07 DIAGNOSIS — M5416 Radiculopathy, lumbar region: Secondary | ICD-10-CM

## 2021-02-07 DIAGNOSIS — R52 Pain, unspecified: Secondary | ICD-10-CM

## 2021-02-07 DIAGNOSIS — I340 Nonrheumatic mitral (valve) insufficiency: Secondary | ICD-10-CM

## 2021-02-19 ENCOUNTER — Other Ambulatory Visit: Payer: Self-pay | Admitting: Family Medicine

## 2021-02-19 DIAGNOSIS — T7840XA Allergy, unspecified, initial encounter: Secondary | ICD-10-CM

## 2021-02-20 DIAGNOSIS — G4733 Obstructive sleep apnea (adult) (pediatric): Secondary | ICD-10-CM | POA: Diagnosis not present

## 2021-02-25 ENCOUNTER — Encounter: Payer: Self-pay | Admitting: Registered Nurse

## 2021-02-25 ENCOUNTER — Telehealth: Payer: Self-pay

## 2021-02-25 ENCOUNTER — Ambulatory Visit (INDEPENDENT_AMBULATORY_CARE_PROVIDER_SITE_OTHER): Payer: Medicare HMO | Admitting: Registered Nurse

## 2021-02-25 ENCOUNTER — Other Ambulatory Visit: Payer: Self-pay

## 2021-02-25 VITALS — BP 132/73 | HR 71 | Temp 98.2°F | Resp 18 | Ht 63.0 in | Wt 272.6 lb

## 2021-02-25 DIAGNOSIS — H66004 Acute suppurative otitis media without spontaneous rupture of ear drum, recurrent, right ear: Secondary | ICD-10-CM

## 2021-02-25 DIAGNOSIS — R110 Nausea: Secondary | ICD-10-CM

## 2021-02-25 MED ORDER — CIPRO HC 0.2-1 % OT SUSP
3.0000 [drp] | Freq: Two times a day (BID) | OTIC | 0 refills | Status: DC | PRN
Start: 2021-02-25 — End: 2021-02-25

## 2021-02-25 MED ORDER — OFLOXACIN 0.3 % OT SOLN: 5.0000 [drp] | Freq: Every day | OTIC | 0 refills | Status: DC

## 2021-02-25 MED ORDER — ONDANSETRON HCL 4 MG PO TABS: 4.0000 mg | ORAL_TABLET | Freq: Three times a day (TID) | ORAL | 0 refills | Status: AC | PRN

## 2021-02-25 MED ORDER — AMOXICILLIN-POT CLAVULANATE 875-125 MG PO TABS: 1.0000 | ORAL_TABLET | Freq: Two times a day (BID) | ORAL | 0 refills | Status: DC

## 2021-02-25 NOTE — Telephone Encounter (Signed)
Caller name:Ellanor Quiros   On DPR? :Yes  Call back [phone]  Provider they see: Richard   Reason for call:Pt was called in ciprofloxacin-hydrocortisone (CIPRO [facility]) OTIC suspension [[id number]]  can something else can be called in this is over 80.00 and patient can not afford this?   [id number] - [place], [place] - [address] [place] [address] [place] [address]  [employer] Ad[employer] PPO is what type of insurance pt has

## 2021-02-25 NOTE — Progress Notes (Signed)
Established Patient Office Visit  Subjective:  Patient ID: Renee Flores, female    DOB: 07/02/1965  Age: 55 y.o. MRN: [id number]  CC:  Chief Complaint  Patient presents with   Ear Pain    Patient states she has been having a ear ache in her right ear for about a month. She has used some oil and drops with no help.    HPI [name] presents for ear pain  Right ear, onset 3-4 weeks ago Using oils and drops OTC with no relief Has had hx of persistent ear infections, most recently saw me for this on September 28 2020. At that time had given her levaquin as she had had recent dose of amoxicillin.   No sinus pressure or pain. No rhinorrhea or nasal congestion  No systemic or constitutional symptoms at this time.  Past Medical History:  Diagnosis Date   Allergy    Arthritis    Bulging lumbar disc    P9-5   Complication of anesthesia    anxiety attack after waking up from anesthesia   Factor 5 Leiden mutation, heterozygous ([place])    Factor V deficiency, congenital ([place]) 03/23/2012   GERD (gastroesophageal reflux disease)    Heart murmur    Heart palpitations    Heart valve problem    mild- moderate mitral valve leakage    High cholesterol    HYPERCHOLESTEROLEMIA 02/27/2010   Qualifier: Diagnosis of  By: [name], Denise     Hypersomnia with sleep apnea, unspecified 11/03/2013   Hypertension    Lumbar radiculopathy 08/12/2018   MS (multiple sclerosis) (HCC)    questionable   Neuromuscular disorder (HCC)    OSA on CPAP 03/25/2017   Osteopenia 04/2016   T score -1.9   Osteoporosis    Preop cardiovascular exam 03/23/2012   Retrognathia 11/03/2013   Right knee DJD 07/08/2012   Severe obesity (BMI >= 40) ([place]) 11/03/2013   Shortness of breath 06/03/2013   Sleep apnea    Vitamin D deficiency 2014   17    Past Surgical History:  Procedure Laterality Date   ABDOMINAL HYSTERECTOMY     APPENDECTOMY     arthroscopic knee surgery     CESAREAN SECTION     x2    chloecystectomy     CHOLECYSTECTOMY     ESOPHAGEAL DILATION  04/09/2012   KNEE LIGAMENT RECONSTRUCTION     laser throat polyps     Left ankle reconstructed     NECK LESION BIOPSY     PATELLA FRACTURE SURGERY     PELVIC LAPAROSCOPY     LSO   REPLACEMENT TOTAL KNEE Left 2012   REPLACEMENT TOTAL KNEE Right 2014   TOE SURGERY     TONSILLECTOMY     TONSILLECTOMY  1979   TOTAL ABDOMINAL HYSTERECTOMY W/ BILATERAL SALPINGOOPHORECTOMY  2000   TOTAL KNEE ARTHROPLASTY Left 2012   TOTAL KNEE ARTHROPLASTY Right 07/08/2012   Procedure: TOTAL KNEE ARTHROPLASTY with revision components ;  Surgeon: Hessie Dibble, MD;  Location: [facility];  Service: Orthopedics;  Laterality: Right;  PATIENT HAS HX. OF FACTOR IV DISORFER   TUBAL LIGATION      Family History  Problem Relation Age of Onset   Heart disease Father    Hypertension Father    Hyperlipidemia Father    Cancer Father    Other Father        Respiratory problems/Factor 5 disorder/1 bypass, 2 valve replacement (mitral and aortic)  Hypertension Mother    Diabetes Mother    Hyperlipidemia Mother    Hypertension Sister    Colon cancer Paternal Aunt    Breast cancer Paternal Aunt 4   Heart disease Maternal Grandfather    Lymphoma Maternal Grandfather    Heart failure Maternal Grandfather    Colon cancer Paternal Grandmother    Hyperlipidemia Brother    Hypertension Brother    Hypertension Brother    Colon cancer Paternal Aunt    Breast cancer Paternal Aunt 62   Stroke Paternal Grandfather     Social History   Socioeconomic History   Marital status: Widowed    Spouse name: [name]   Number of children: 2   Years of education: 12   Highest education level: Not on file  Occupational History    Employer: [employer]  Tobacco Use   Smoking status: Never   Smokeless tobacco: Never  Substance and Sexual Activity   Alcohol use: Not Currently    Alcohol/week: 0.0 standard drinks   Drug use: No   Sexual activity: Yes     Birth control/protection: Surgical    Comment: HYST-1st intercourse 55 yo-Fewer than 5 partners  Other Topics Concern   Not on file  Social History Narrative   [name]  and lives at home with her husband.   Patient has two children.   Patient works as a Mudlogger for [employer].   Patient drinks one cup of caffeine daily.   Patient is right-handed.   Patient has a high school education.   Social Determinants of Health   Financial Resource Strain: Low Risk    Difficulty of Paying Living Expenses: Not hard at all  Food Insecurity: No Food Insecurity   Worried About Charity fundraiser in the Last Year: Never true   [place] in the Last Year: Never true  Transportation Needs: No Transportation Needs   Lack of Transportation (Medical): No   Lack of Transportation (Non-Medical): No  Physical Activity: Insufficiently Active   Days of Exercise per Week: 7 days   Minutes of Exercise per Session: 10 min  Stress: No Stress Concern Present   Feeling of Stress : Not at all  Social Connections: Moderately Integrated   Frequency of Communication with Friends and Family: More than three times a week   Frequency of Social Gatherings with Friends and Family: More than three times a week   Attends Religious Services: More than 4 times per year   Active Member of [employer] or Organizations: Yes   Attends Archivist Meetings: 1 to 4 times per year   Marital Status: Widowed  Human resources officer Violence: Not At Risk   Fear of Current or Ex-Partner: No   Emotionally Abused: No   Physically Abused: No   Sexually Abused: No    Outpatient Medications Prior to Visit  Medication Sig Dispense Refill   aspirin EC 81 MG tablet Take 81 mg by mouth daily.     azelastine (ASTELIN) 0.1 % nasal spray Place 1 spray into both nostrils 2 (two) times daily. Use in each nostril as directed 30 mL 12   cloNIDine (CATAPRES) 0.1 MG tablet TAKE 1 TABLET THREE TIMES DAILY  (Patient taking differently: Take 0.1 mg by mouth 3 (three) times daily.) 270 tablet 0   fluticasone (FLONASE) 50 MCG/ACT nasal spray Place 2 sprays into both nostrils daily. 16 g 6   gabapentin (NEURONTIN) 600 MG tablet Take 1  tablet (600 mg total) by mouth at bedtime. 30 tablet 0   hydrochlorothiazide (HYDRODIURIL) 25 MG tablet Take 1 tablet (25 mg total) by mouth daily. 90 tablet 0   ketotifen (ZADITOR) 0.025 % ophthalmic solution Place 2 drops into both eyes daily as needed (for irritation). 5 mL 0   losartan (COZAAR) 50 MG tablet TAKE 1 TABLET EVERY DAY 90 tablet 0   meloxicam (MOBIC) 15 MG tablet TAKE 1 TABLET EVERY DAY 90 tablet 0   metoprolol succinate (TOPROL-XL) 50 MG 24 hr tablet Take 1 tablet (50 mg total) by mouth daily. 90 tablet 1   montelukast (SINGULAIR) 10 MG tablet TAKE 1 TABLET AT BEDTIME 90 tablet 0   Multiple Vitamins-Minerals (ONE-A-DAY WOMENS 50+ ADVANTAGE PO) Take 1 tablet by mouth daily. Unknown strength     omeprazole (PRILOSEC) 40 MG capsule TAKE 1 CAPSULE EVERY DAY (Patient taking differently: Take 40 mg by mouth daily.) 90 capsule 0   promethazine-dextromethorphan (PROMETHAZINE-DM) 6.25-15 MG/5ML syrup Take 5 mLs by mouth 2 (two) times daily as needed for cough. 118 mL 0   simvastatin (ZOCOR) 40 MG tablet TAKE 1 TABLET AT BEDTIME (Patient taking differently: Take 40 mg by mouth daily at 6 PM.) 90 tablet 0   No facility-administered medications prior to visit.    Allergies  Allergen Reactions   Erythromycin Base Hives   Morphine Other (See Comments)    Hallucinating    Vancomycin Hcl Other (See Comments)    Ringing in ears   Doxycycline Nausea Only and Rash    ROS Review of Systems  Constitutional: Negative.   HENT:  Positive for ear pain. Negative for congestion, dental problem, drooling, ear discharge, facial swelling, hearing loss, mouth sores, nosebleeds, postnasal drip, rhinorrhea, sinus pressure, sinus pain, sneezing, sore throat, tinnitus, trouble  swallowing and voice change.   Eyes: Negative.   Respiratory: Negative.    Cardiovascular: Negative.   Gastrointestinal: Negative.   Genitourinary: Negative.   Musculoskeletal: Negative.   Skin: Negative.   Neurological: Negative.   Psychiatric/Behavioral: Negative.    All other systems reviewed and are negative.    Objective:    Physical Exam Vitals and nursing note reviewed.  Constitutional:      General: She is not in acute distress.    Appearance: Normal appearance. She is normal weight. She is not ill-appearing, toxic-appearing or diaphoretic.  HENT:     Right Ear: Tenderness present. A middle ear effusion is present. There is no impacted cerumen. No mastoid tenderness. Tympanic membrane is not erythematous.     Left Ear: Tympanic membrane, ear canal and external ear normal. There is no impacted cerumen.  Cardiovascular:     Rate and Rhythm: Normal rate and regular rhythm.     Heart sounds: Normal heart sounds. No murmur heard.   No friction rub. No gallop.  Pulmonary:     Effort: Pulmonary effort is normal. No respiratory distress.     Breath sounds: Normal breath sounds. No stridor. No wheezing, rhonchi or rales.  Chest:     Chest wall: No tenderness.  Skin:    General: Skin is warm and dry.  Neurological:     General: No focal deficit present.     Mental Status: She is alert and oriented to person, place, and time. Mental status is at baseline.  Psychiatric:        Mood and Affect: Mood normal.        Behavior: Behavior normal.  Thought Content: Thought content normal.        Judgment: Judgment normal.    BP 132/73   Pulse 71   Temp 98.2 F (36.8 C) (Temporal)   Resp 18   Ht 5\' 3"  (1.6 m)   Wt 272 lb 9.6 oz (123.7 kg)   SpO2 99%   BMI 48.29 kg/m  Wt Readings from Last 3 Encounters:  02/25/21 272 lb 9.6 oz (123.7 kg)  01/29/21 275 lb (124.7 kg)  10/08/20 271 lb (122.9 kg)     Health Maintenance Due  Topic Date Due   Pneumococcal Vaccine  21-14 Years old (1 - PCV) Never done   Zoster Vaccines- Shingrix (1 of 2) Never done   COVID-19 Vaccine (4 - Booster for Moderna series) 05/31/2020   INFLUENZA VACCINE  Never done    There are no preventive care reminders to display for this patient.  Lab Results  Component Value Date   TSH 1.57 09/11/2020   Lab Results  Component Value Date   WBC 7.0 09/11/2020   HGB 12.6 09/11/2020   HCT 37.9 09/11/2020   MCV 85.9 09/11/2020   PLT 268.0 09/11/2020   Lab Results  Component Value Date   NA 143 09/26/2020   K 4.4 09/26/2020   CO2 20 09/26/2020   GLUCOSE 114 (H) 09/26/2020   BUN 13 09/26/2020   CREATININE 0.79 09/26/2020   BILITOT 0.5 09/26/2020   ALKPHOS 86 09/26/2020   AST 48 (H) 09/26/2020   ALT 56 (H) 09/26/2020   PROT 6.9 09/26/2020   ALBUMIN 4.2 09/26/2020   CALCIUM 9.5 09/26/2020   GFR 85.12 09/26/2020   Lab Results  Component Value Date   CHOL 189 09/11/2020   Lab Results  Component Value Date   HDL 64.40 09/11/2020   No results found for: [facility] Lab Results  Component Value Date   TRIG 215.0 (H) 09/11/2020   Lab Results  Component Value Date   CHOLHDL 3 09/11/2020   Lab Results  Component Value Date   HGBA1C 6.1 09/12/2020      Assessment & Plan:   Problem List Items Addressed This Visit   None Visit Diagnoses     Recurrent acute suppurative otitis media of right ear without spontaneous rupture of tympanic membrane    -  Primary   Relevant Medications   amoxicillin-clavulanate (AUGMENTIN) 875-125 MG tablet   ciprofloxacin-hydrocortisone (CIPRO HC) OTIC suspension   Nausea without vomiting       Relevant Medications   ondansetron (ZOFRAN) 4 MG tablet       Meds ordered this encounter  Medications   amoxicillin-clavulanate (AUGMENTIN) 875-125 MG tablet    Sig: Take 1 tablet by mouth 2 (two) times daily.    Dispense:  20 tablet    Refill:  0    Order Specific Question:   Supervising Provider    Answer:   Carlota Raspberry, JEFFREY R [[id number]]    ondansetron (ZOFRAN) 4 MG tablet    Sig: Take 1 tablet (4 mg total) by mouth every 8 (eight) hours as needed for nausea or vomiting.    Dispense:  20 tablet    Refill:  0    Order Specific Question:   Supervising Provider    Answer:   Carlota Raspberry, JEFFREY R [[id number]]   ciprofloxacin-hydrocortisone (CIPRO HC) OTIC suspension    Sig: Place 3 drops into the right ear 2 (two) times daily as needed.    Dispense:  10 mL    Refill:  0  Order Specific Question:   Supervising Provider    Answer:   Carlota Raspberry, JEFFREY R [[id number]]    Follow-up: Return if symptoms worsen or fail to improve.   PLAN Augmentin for AOM as above. Reviewed reasons to return to clinic and nonpharm relief. Cipro hc as well given recurrence of infection.  Zofran for nausea without vomiting Patient encouraged to call clinic with any questions, comments, or concerns.  Maximiano Coss, NP

## 2021-02-25 NOTE — Telephone Encounter (Signed)
Sent alternative  Thanks,  Writer

## 2021-02-25 NOTE — Patient Instructions (Addendum)
Ms. Renee Flores to see you  Use augmentin - finish entire course.  Can use ear drops - want you to use these for at least the next five days. After that ok to continue if you'd like.  Zofran as needed as directed  If no changes or no improvement by Thursday, give me a call  Happy Halloween!  Rich     If you have lab work done today you will be contacted with your lab results within the next 2 weeks.  If you have not heard from [place] then please contact us. The fastest way to get your results is to register for My Chart.   IF you received an x-ray today, you will receive an invoice from [facility]. Please contact [facility] at [phone] with questions or concerns regarding your invoice.   IF you received labwork today, you will receive an invoice from [facility]. Please contact [facility] at [phone] with questions or concerns regarding your invoice.   Our billing staff will not be able to assist you with questions regarding bills from these companies.  You will be contacted with the lab results as soon as they are available. The fastest way to get your results is to activate your My Chart account. Instructions are located on the last page of this paperwork. If you have not heard from [place] regarding the results in 2 weeks, please contact this office.

## 2021-02-25 NOTE — Addendum Note (Signed)
Addended by: Maximiano Coss on: 02/25/2021 04:41 PM   Modules accepted: Orders

## 2021-02-26 ENCOUNTER — Ambulatory Visit: Payer: Medicare HMO | Admitting: Neurology

## 2021-03-01 ENCOUNTER — Encounter: Payer: Self-pay | Admitting: Neurology

## 2021-03-01 ENCOUNTER — Other Ambulatory Visit: Payer: Self-pay

## 2021-03-01 ENCOUNTER — Encounter: Payer: Self-pay | Admitting: Registered Nurse

## 2021-03-01 ENCOUNTER — Ambulatory Visit: Payer: Medicare HMO | Admitting: Neurology

## 2021-03-01 ENCOUNTER — Ambulatory Visit (INDEPENDENT_AMBULATORY_CARE_PROVIDER_SITE_OTHER): Payer: Medicare HMO | Admitting: Registered Nurse

## 2021-03-01 VITALS — BP 141/80 | HR 70 | Temp 98.2°F | Resp 18 | Ht 63.0 in | Wt 271.6 lb

## 2021-03-01 VITALS — BP 156/86 | HR 72 | Ht 63.0 in | Wt 272.5 lb

## 2021-03-01 DIAGNOSIS — Z9989 Dependence on other enabling machines and devices: Secondary | ICD-10-CM | POA: Insufficient documentation

## 2021-03-01 DIAGNOSIS — M5442 Lumbago with sciatica, left side: Secondary | ICD-10-CM

## 2021-03-01 DIAGNOSIS — T733XXD Exhaustion due to excessive exertion, subsequent encounter: Secondary | ICD-10-CM

## 2021-03-01 DIAGNOSIS — G4733 Obstructive sleep apnea (adult) (pediatric): Secondary | ICD-10-CM | POA: Diagnosis not present

## 2021-03-01 DIAGNOSIS — R002 Palpitations: Secondary | ICD-10-CM

## 2021-03-01 DIAGNOSIS — H6983 Other specified disorders of Eustachian tube, bilateral: Secondary | ICD-10-CM | POA: Diagnosis not present

## 2021-03-01 MED ORDER — CYCLOBENZAPRINE HCL 5 MG PO TABS: 5.0000 mg | ORAL_TABLET | Freq: Three times a day (TID) | ORAL | 1 refills | Status: AC | PRN

## 2021-03-01 MED ORDER — DICLOFENAC SODIUM 75 MG PO TBEC: 75.0000 mg | DELAYED_RELEASE_TABLET | Freq: Two times a day (BID) | ORAL | 0 refills | Status: DC

## 2021-03-01 MED ORDER — METHYLPREDNISOLONE ACETATE 80 MG/ML IJ SUSP
80.0000 mg | Freq: Once | INTRAMUSCULAR | Status: AC
  Administered 2021-03-01: 80 mg via INTRAMUSCULAR

## 2021-03-01 NOTE — Patient Instructions (Signed)
Ms. Renee Flores to see you, sorry it was so soon  Steroid shot should help both ears and back  Don't take diclofenac and meloxicam in the same day.  If symptoms ongoing Monday, give me a call and we can figure out next steps  Thank you  Denice Paradise

## 2021-03-01 NOTE — Progress Notes (Signed)
Established Patient Office Visit  Subjective:  Patient ID: Renee Flores, female    DOB: 09-07-1965  Age: 55 y.o. MRN: [id number]  CC:  Chief Complaint  Patient presents with   Follow-up    Patient states she is here for follow up on her ear pain. Patient would like to know what's next.    HPI Renee Flores presents for follow up   Ongoing ear pain Seen Monday, acute otitis media. Given augmentin and cipro drops. No improvement since.  No new symptoms.  Still pressure with occ sharp pain.  Also notes flare of sciatic pain Has had this on and off in past MRI 2017 shows bulging disc L4-L5  Past Medical History:  Diagnosis Date   Allergy    Arthritis    Bulging lumbar disc    A0-7   Complication of anesthesia    anxiety attack after waking up from anesthesia   Factor 5 Leiden mutation, heterozygous ([place])    Factor V deficiency, congenital ([place]) 03/23/2012   GERD (gastroesophageal reflux disease)    Heart murmur    Heart palpitations    Heart valve problem    mild- moderate mitral valve leakage    High cholesterol    HYPERCHOLESTEROLEMIA 02/27/2010   Qualifier: Diagnosis of  By: [name], Denise     Hypersomnia with sleep apnea, unspecified 11/03/2013   Hypertension    Lumbar radiculopathy 08/12/2018   MS (multiple sclerosis) (HCC)    questionable   Neuromuscular disorder (HCC)    OSA on CPAP 03/25/2017   Osteopenia 04/2016   T score -1.9   Osteoporosis    Preop cardiovascular exam 03/23/2012   Retrognathia 11/03/2013   Right knee DJD 07/08/2012   Severe obesity (BMI >= 40) ([place]) 11/03/2013   Shortness of breath 06/03/2013   Sleep apnea    Vitamin D deficiency 2014   17    Past Surgical History:  Procedure Laterality Date   ABDOMINAL HYSTERECTOMY     APPENDECTOMY     arthroscopic knee surgery     CESAREAN SECTION     x2   chloecystectomy     CHOLECYSTECTOMY     ESOPHAGEAL DILATION  04/09/2012   KNEE LIGAMENT RECONSTRUCTION     laser throat  polyps     Left ankle reconstructed     NECK LESION BIOPSY     PATELLA FRACTURE SURGERY     PELVIC LAPAROSCOPY     LSO   REPLACEMENT TOTAL KNEE Left 2012   REPLACEMENT TOTAL KNEE Right 2014   TOE SURGERY     TONSILLECTOMY     TONSILLECTOMY  1979   TOTAL ABDOMINAL HYSTERECTOMY W/ BILATERAL SALPINGOOPHORECTOMY  2000   TOTAL KNEE ARTHROPLASTY Left 2012   TOTAL KNEE ARTHROPLASTY Right 07/08/2012   Procedure: TOTAL KNEE ARTHROPLASTY with revision components ;  Surgeon: Hessie Dibble, MD;  Location: [facility];  Service: Orthopedics;  Laterality: Right;  PATIENT HAS HX. OF FACTOR IV DISORFER   TUBAL LIGATION      Family History  Problem Relation Age of Onset   Heart disease Father    Hypertension Father    Hyperlipidemia Father    Cancer Father    Other Father        Respiratory problems/Factor 5 disorder/1 bypass, 2 valve replacement (mitral and aortic)    Hypertension Mother    Diabetes Mother    Hyperlipidemia Mother    Hypertension Sister    Colon cancer Paternal Aunt  Breast cancer Paternal Aunt 88   Heart disease Maternal Grandfather    Lymphoma Maternal Grandfather    Heart failure Maternal Grandfather    Colon cancer Paternal Grandmother    Hyperlipidemia Brother    Hypertension Brother    Hypertension Brother    Colon cancer Paternal Aunt    Breast cancer Paternal Aunt 47   Stroke Paternal Grandfather     Social History   Socioeconomic History   Marital status: Widowed    Spouse name: [name]   Number of children: 2   Years of education: 12   Highest education level: Not on file  Occupational History    Employer: [employer]  Tobacco Use   Smoking status: Never   Smokeless tobacco: Never  Substance and Sexual Activity   Alcohol use: Not Currently    Alcohol/week: 0.0 standard drinks   Drug use: No   Sexual activity: Yes    Birth control/protection: Surgical    Comment: HYST-1st intercourse 55 yo-Fewer than 5 partners  Other Topics Concern    Not on file  Social History Narrative   [name]  and lives at home with her husband.   Patient has two children.   Patient works as a Mudlogger for [employer].   Patient drinks one cup of caffeine daily.   Patient is right-handed.   Patient has a high school education.   Social Determinants of Health   Financial Resource Strain: Low Risk    Difficulty of Paying Living Expenses: Not hard at all  Food Insecurity: No Food Insecurity   Worried About Charity fundraiser in the Last Year: Never true   [place] in the Last Year: Never true  Transportation Needs: No Transportation Needs   Lack of Transportation (Medical): No   Lack of Transportation (Non-Medical): No  Physical Activity: Insufficiently Active   Days of Exercise per Week: 7 days   Minutes of Exercise per Session: 10 min  Stress: No Stress Concern Present   Feeling of Stress : Not at all  Social Connections: Moderately Integrated   Frequency of Communication with Friends and Family: More than three times a week   Frequency of Social Gatherings with Friends and Family: More than three times a week   Attends Religious Services: More than 4 times per year   Active Member of [employer] or Organizations: Yes   Attends Archivist Meetings: 1 to 4 times per year   Marital Status: Widowed  Human resources officer Violence: Not At Risk   Fear of Current or Ex-Partner: No   Emotionally Abused: No   Physically Abused: No   Sexually Abused: No    Outpatient Medications Prior to Visit  Medication Sig Dispense Refill   aspirin EC 81 MG tablet Take 81 mg by mouth daily.     azelastine (ASTELIN) 0.1 % nasal spray Place 1 spray into both nostrils 2 (two) times daily. Use in each nostril as directed 30 mL 12   cloNIDine (CATAPRES) 0.1 MG tablet TAKE 1 TABLET THREE TIMES DAILY (Patient taking differently: Take 0.1 mg by mouth 3 (three) times daily.) 270 tablet 0   fluticasone (FLONASE) 50 MCG/ACT nasal  spray Place 2 sprays into both nostrils daily. 16 g 6   gabapentin (NEURONTIN) 600 MG tablet Take 1 tablet (600 mg total) by mouth at bedtime. 30 tablet 0   hydrochlorothiazide (HYDRODIURIL) 25 MG tablet Take 1 tablet (25 mg total) by mouth daily.  90 tablet 0   ketotifen (ZADITOR) 0.025 % ophthalmic solution Place 2 drops into both eyes daily as needed (for irritation). 5 mL 0   losartan (COZAAR) 50 MG tablet TAKE 1 TABLET EVERY DAY 90 tablet 0   meloxicam (MOBIC) 15 MG tablet TAKE 1 TABLET EVERY DAY 90 tablet 0   metoprolol succinate (TOPROL-XL) 50 MG 24 hr tablet Take 1 tablet (50 mg total) by mouth daily. 90 tablet 1   montelukast (SINGULAIR) 10 MG tablet TAKE 1 TABLET AT BEDTIME 90 tablet 0   Multiple Vitamins-Minerals (ONE-A-DAY WOMENS 50+ ADVANTAGE PO) Take 1 tablet by mouth daily. Unknown strength     ofloxacin (FLOXIN OTIC) 0.3 % OTIC solution Place 5 drops into both ears daily. 5 mL 0   omeprazole (PRILOSEC) 40 MG capsule TAKE 1 CAPSULE EVERY DAY (Patient taking differently: Take 40 mg by mouth daily.) 90 capsule 0   ondansetron (ZOFRAN) 4 MG tablet Take 1 tablet (4 mg total) by mouth every 8 (eight) hours as needed for nausea or vomiting. 20 tablet 0   promethazine-dextromethorphan (PROMETHAZINE-DM) 6.25-15 MG/5ML syrup Take 5 mLs by mouth 2 (two) times daily as needed for cough. 118 mL 0   simvastatin (ZOCOR) 40 MG tablet TAKE 1 TABLET AT BEDTIME (Patient taking differently: Take 40 mg by mouth daily at 6 PM.) 90 tablet 0   No facility-administered medications prior to visit.    Allergies  Allergen Reactions   Erythromycin Base Hives   Morphine Other (See Comments)    Hallucinating    Vancomycin Hcl Other (See Comments)    Ringing in ears   Doxycycline Nausea Only and Rash    ROS Review of Systems  Constitutional: Negative.   HENT:  Positive for ear pain and sinus pain.   Eyes: Negative.   Respiratory: Negative.    Cardiovascular: Negative.   Gastrointestinal: Negative.    Genitourinary: Negative.   Musculoskeletal:  Positive for back pain.  Skin: Negative.   Neurological: Negative.   Psychiatric/Behavioral: Negative.    All other systems reviewed and are negative.    Objective:    Physical Exam Vitals and nursing note reviewed.  Constitutional:      General: She is not in acute distress.    Appearance: Normal appearance. She is normal weight. She is not ill-appearing, toxic-appearing or diaphoretic.  HENT:     Right Ear: Tympanic membrane, ear canal and external ear normal. There is no impacted cerumen.     Left Ear: Tympanic membrane, ear canal and external ear normal. There is no impacted cerumen.  Cardiovascular:     Rate and Rhythm: Normal rate and regular rhythm.     Heart sounds: Normal heart sounds. No murmur heard.   No friction rub. No gallop.  Pulmonary:     Effort: Pulmonary effort is normal. No respiratory distress.     Breath sounds: Normal breath sounds. No stridor. No wheezing, rhonchi or rales.  Chest:     Chest wall: No tenderness.  Musculoskeletal:        General: Tenderness (left lower back. positive straight leg raise on L) present.  Skin:    General: Skin is warm and dry.  Neurological:     General: No focal deficit present.     Mental Status: She is alert and oriented to person, place, and time. Mental status is at baseline.  Psychiatric:        Mood and Affect: Mood normal.        Behavior: Behavior  normal.        Thought Content: Thought content normal.        Judgment: Judgment normal.    BP (!) 141/80   Pulse 70   Temp 98.2 F (36.8 C) (Temporal)   Resp 18   Ht 5\' 3"  (1.6 m)   Wt 271 lb 9.6 oz (123.2 kg)   SpO2 99%   BMI 48.11 kg/m  Wt Readings from Last 3 Encounters:  03/01/21 271 lb 9.6 oz (123.2 kg)  03/01/21 272 lb 8 oz (123.6 kg)  02/25/21 272 lb 9.6 oz (123.7 kg)     Health Maintenance Due  Topic Date Due   Pneumococcal Vaccine 24-48 Years old (1 - PCV) Never done   Zoster Vaccines-  Shingrix (1 of 2) Never done   COVID-19 Vaccine (4 - Booster for Moderna series) 05/31/2020   INFLUENZA VACCINE  Never done    There are no preventive care reminders to display for this patient.  Lab Results  Component Value Date   TSH 1.57 09/11/2020   Lab Results  Component Value Date   WBC 7.0 09/11/2020   HGB 12.6 09/11/2020   HCT 37.9 09/11/2020   MCV 85.9 09/11/2020   PLT 268.0 09/11/2020   Lab Results  Component Value Date   NA 143 09/26/2020   K 4.4 09/26/2020   CO2 20 09/26/2020   GLUCOSE 114 (H) 09/26/2020   BUN 13 09/26/2020   CREATININE 0.79 09/26/2020   BILITOT 0.5 09/26/2020   ALKPHOS 86 09/26/2020   AST 48 (H) 09/26/2020   ALT 56 (H) 09/26/2020   PROT 6.9 09/26/2020   ALBUMIN 4.2 09/26/2020   CALCIUM 9.5 09/26/2020   GFR 85.12 09/26/2020   Lab Results  Component Value Date   CHOL 189 09/11/2020   Lab Results  Component Value Date   HDL 64.40 09/11/2020   No results found for: [facility] Lab Results  Component Value Date   TRIG 215.0 (H) 09/11/2020   Lab Results  Component Value Date   CHOLHDL 3 09/11/2020   Lab Results  Component Value Date   HGBA1C 6.1 09/12/2020      Assessment & Plan:   Problem List Items Addressed This Visit   None Visit Diagnoses     Acute left-sided low back pain with left-sided sciatica    -  Primary   Relevant Medications   methylPREDNISolone acetate (DEPO-MEDROL) injection 80 mg (Start on 03/01/2021  2:00 PM)   diclofenac (VOLTAREN) 75 MG EC tablet   cyclobenzaprine (FLEXERIL) 5 MG tablet   Dysfunction of both eustachian tubes       Relevant Medications   methylPREDNISolone acetate (DEPO-MEDROL) injection 80 mg (Start on 03/01/2021  2:00 PM)       Meds ordered this encounter  Medications   methylPREDNISolone acetate (DEPO-MEDROL) injection 80 mg   diclofenac (VOLTAREN) 75 MG EC tablet    Sig: Take 1 tablet (75 mg total) by mouth 2 (two) times daily.    Dispense:  30 tablet    Refill:  0    Order  Specific Question:   Supervising Provider    Answer:   Carlota Raspberry, JEFFREY R [[id number]]   cyclobenzaprine (FLEXERIL) 5 MG tablet    Sig: Take 1 tablet (5 mg total) by mouth 3 (three) times daily as needed for muscle spasms.    Dispense:  30 tablet    Refill:  1    Order Specific Question:   Supervising Provider    Answer:  GREENE, JEFFREY R [[id number]]    Follow-up: Return if symptoms worsen or fail to improve.   PLAN Stop meloxicam, start diclofenac Depo medrol 80mg  IM given in office Flexeril for back pain. Suspect ear pain is allergies / eustachian tube dysfunction. If not resolved by Monday will refer to ENT. If back pain persists will pursue xray lower back Patient encouraged to call clinic with any questions, comments, or concerns.  Maximiano Coss, NP
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
## 2021-03-13 DIAGNOSIS — H5212 Myopia, left eye: Secondary | ICD-10-CM | POA: Diagnosis not present

## 2021-03-13 DIAGNOSIS — M19072 Primary osteoarthritis, left ankle and foot: Secondary | ICD-10-CM | POA: Diagnosis not present

## 2021-03-14 ENCOUNTER — Encounter: Payer: Self-pay | Admitting: Family Medicine

## 2021-03-14 ENCOUNTER — Ambulatory Visit (INDEPENDENT_AMBULATORY_CARE_PROVIDER_SITE_OTHER): Payer: Medicare HMO | Admitting: Family Medicine

## 2021-03-14 VITALS — BP 136/84 | HR 84 | Temp 98.5°F | Resp 16 | Wt 270.2 lb

## 2021-03-14 DIAGNOSIS — I10 Essential (primary) hypertension: Secondary | ICD-10-CM | POA: Diagnosis not present

## 2021-03-14 DIAGNOSIS — E7800 Pure hypercholesterolemia, unspecified: Secondary | ICD-10-CM

## 2021-03-14 DIAGNOSIS — H524 Presbyopia: Secondary | ICD-10-CM | POA: Diagnosis not present

## 2021-03-14 DIAGNOSIS — H9201 Otalgia, right ear: Secondary | ICD-10-CM

## 2021-03-14 LAB — CBC WITH DIFFERENTIAL/PLATELET
Basophils Absolute: 0 10*3/uL (ref 0.0–0.1)
Basophils Relative: 0.6 % (ref 0.0–3.0)
Eosinophils Absolute: 0.3 10*3/uL (ref 0.0–0.7)
Eosinophils Relative: 4 % (ref 0.0–5.0)
HCT: 40.2 % (ref 36.0–46.0)
Hemoglobin: 12.9 g/dL (ref 12.0–15.0)
Lymphocytes Relative: 31.6 % (ref 12.0–46.0)
Lymphs Abs: 2.2 10*3/uL (ref 0.7–4.0)
MCHC: 32.1 g/dL (ref 30.0–36.0)
MCV: 87.9 fl (ref 78.0–100.0)
Monocytes Absolute: 0.6 10*3/uL (ref 0.1–1.0)
Monocytes Relative: 8.5 % (ref 3.0–12.0)
Neutro Abs: 3.9 10*3/uL (ref 1.4–7.7)
Neutrophils Relative %: 55.3 % (ref 43.0–77.0)
Platelets: 236 10*3/uL (ref 150.0–400.0)
RBC: 4.57 Mil/uL (ref 3.87–5.11)
RDW: 13.6 % (ref 11.5–15.5)
WBC: 7.1 10*3/uL (ref 4.0–10.5)

## 2021-03-14 LAB — LIPID PANEL
Cholesterol: 178 mg/dL (ref 0–200)
HDL: 66.2 mg/dL (ref >39.00)
LDL Cholesterol: 85 mg/dL (ref 0–99)
NonHDL: 112.27
Total CHOL/HDL Ratio: 3
Triglycerides: 134 mg/dL (ref 0.0–149.0)
VLDL: 26.8 mg/dL (ref 0.0–40.0)

## 2021-03-14 LAB — BASIC METABOLIC PANEL
BUN: 12 mg/dL (ref 6–23)
CO2: 29 mEq/L (ref 19–32)
Calcium: 9.8 mg/dL (ref 8.4–10.5)
Chloride: 101 mEq/L (ref 96–112)
Creatinine, Ser: 0.71 mg/dL (ref 0.40–1.20)
GFR: 96.45 mL/min (ref >60.00)
Glucose, Bld: 97 mg/dL (ref 70–99)
Potassium: 4.3 mEq/L (ref 3.5–5.1)
Sodium: 139 mEq/L (ref 135–145)

## 2021-03-14 LAB — HEPATIC FUNCTION PANEL
ALT: 47 U/L — ABNORMAL HIGH (ref 0–35)
AST: 35 U/L (ref 0–37)
Albumin: 4.6 g/dL (ref 3.5–5.2)
Alkaline Phosphatase: 99 U/L (ref 39–117)
Bilirubin, Direct: 0.1 mg/dL (ref 0.0–0.3)
Total Bilirubin: 0.4 mg/dL (ref 0.2–1.2)
Total Protein: 7.5 g/dL (ref 6.0–8.3)

## 2021-03-14 LAB — TSH: TSH: 1.99 u[IU]/mL (ref 0.35–5.50)

## 2021-03-14 NOTE — Patient Instructions (Signed)
Schedule your complete physical in 6 months We'll notify you of your lab results and make any changes if needed Continue to work on healthy diet and regular exercise- you can do it! IF the ear doesn't feel better towards the end of the antibiotics, let me know and we can refer to ENT Call with any questions or concerns Stay Safe!  Stay Healthy! Happy Holidays!!!

## 2021-03-14 NOTE — Progress Notes (Signed)
   Subjective:    Patient ID: Renee Flores, female    DOB: [birth year], 55 y.o.   MRN: [id number]  HPI HTN- chronic problem, on Clonidine 0.1mg  TID, HCTZ 25mg  daily, Losartan 50mg  daily, Metoprolol XL 50mg  BID.  BP is mildly elevated.  BP was well controlled at last visit- 132/73 on 10/31.  No CP, SOB, HAs, visual changes, edema.  Hyperlipidemia- chronic problem, on Simvastatin 40mg  daily.  No abd pain, N/V.  Obesity- pt's BMI is 47.86  Is going to the gym 1-2x/week.  Limited due to chronic foot issues.  Not following a particular diet.  R ear pain- saw Richard on 10/31 and started on Augmentin and Ofloxacin Otic.  She just started Augmentin a few days ago due to a pharmacy error.  Pt also has allergy issues.  Ear pain x2 months.  Currently on Singulair at night and has added OTC allergy medication for morning.   Review of Systems For ROS see HPI   This visit occurred during the SARS-CoV-2 public health emergency.  Safety protocols were in place, including screening questions prior to the visit, additional usage of staff PPE, and extensive cleaning of exam room while observing appropriate contact time as indicated for disinfecting solutions.      Objective:   Physical Exam Vitals reviewed.  Constitutional:      General: She is not in acute distress.    Appearance: Normal appearance. She is well-developed. She is obese. She is not ill-appearing.  HENT:     Head: Normocephalic and atraumatic.     Right Ear: No middle ear effusion. Tympanic membrane is retracted. Tympanic membrane is not injected or erythematous.     Left Ear:  No middle ear effusion. Tympanic membrane is retracted. Tympanic membrane is not injected or erythematous.  Eyes:     Conjunctiva/sclera: Conjunctivae normal.     Pupils: Pupils are equal, round, and reactive to light.  Neck:     Thyroid: No thyromegaly.  Cardiovascular:     Rate and Rhythm: Normal rate and regular rhythm.     Heart sounds: Normal heart  sounds. No murmur heard. Pulmonary:     Effort: Pulmonary effort is normal. No respiratory distress.     Breath sounds: Normal breath sounds.  Abdominal:     General: There is no distension.     Palpations: Abdomen is soft.     Tenderness: There is no abdominal tenderness.  Musculoskeletal:     Cervical back: Normal range of motion and neck supple.  Lymphadenopathy:     Cervical: No cervical adenopathy.  Skin:    General: Skin is warm and dry.  Neurological:     Mental Status: She is alert and oriented to person, place, and time.  Psychiatric:        Behavior: Behavior normal.          Assessment & Plan:   R ear pain- no evidence of infxn.  Both ears are retracted which may be the cause of her pain.  She is currently on Augmentin.  Encouraged her to finish as directed and if still having pain to let me know so we can refer to ENT for possible tube placement

## 2021-03-14 NOTE — Assessment & Plan Note (Signed)
Pt's BMI is 47.86  States she is going to gym 1-2x/week.  Encouraged her to increase that if possible.  Encouraged healthy diet.  Will continue to follow.

## 2021-03-14 NOTE — Assessment & Plan Note (Signed)
BP was initially elevated but came down into normal range by end of visit.  Currently on Clonidine 0.1mg  TID, HCTZ 25mg  daily, Losartan 50mg  daily, and Metoprolol XL 50mg  BID.  Currently asymptomatic.  Check labs due to HCTZ and ARB but no anticipated med changes.

## 2021-03-14 NOTE — Assessment & Plan Note (Signed)
Chronic problem.  Tolerating statin w/o difficulty.  Check labs.  Adjust meds prn  

## 2021-03-16 ENCOUNTER — Other Ambulatory Visit: Payer: Self-pay | Admitting: Family Medicine

## 2021-03-16 DIAGNOSIS — E1169 Type 2 diabetes mellitus with other specified complication: Secondary | ICD-10-CM

## 2021-03-18 ENCOUNTER — Encounter: Payer: Self-pay | Admitting: Family Medicine

## 2021-03-18 DIAGNOSIS — H9201 Otalgia, right ear: Secondary | ICD-10-CM

## 2021-03-18 DIAGNOSIS — H6983 Other specified disorders of Eustachian tube, bilateral: Secondary | ICD-10-CM

## 2021-03-23 ENCOUNTER — Ambulatory Visit (INDEPENDENT_AMBULATORY_CARE_PROVIDER_SITE_OTHER): Payer: Medicare HMO

## 2021-03-23 ENCOUNTER — Ambulatory Visit
Admission: EM | Admit: 2021-03-23 | Discharge: 2021-03-23 | Disposition: A | Discharge status: Home / Self Care | Payer: Medicare HMO | Attending: Physician Assistant | Admitting: Physician Assistant

## 2021-03-23 ENCOUNTER — Other Ambulatory Visit: Payer: Self-pay

## 2021-03-23 DIAGNOSIS — R059 Cough, unspecified: Secondary | ICD-10-CM | POA: Diagnosis not present

## 2021-03-23 DIAGNOSIS — R051 Acute cough: Secondary | ICD-10-CM

## 2021-03-23 DIAGNOSIS — R509 Fever, unspecified: Secondary | ICD-10-CM | POA: Diagnosis not present

## 2021-03-23 MED ORDER — ALBUTEROL SULFATE HFA 108 (90 BASE) MCG/ACT IN AERS: 2.0000 | INHALATION_SPRAY | Freq: Four times a day (QID) | RESPIRATORY_TRACT | 2 refills | Status: DC | PRN

## 2021-03-23 NOTE — Discharge Instructions (Addendum)
See your Physician for recheck and referral to ENT.  Use albuterol inhaler.  Your covid and influenza test are pending

## 2021-03-23 NOTE — ED Triage Notes (Signed)
Pt states that she has some ongoing ear pain. X3-4 months.   Pt states that she had a fever overnight.   Pt states that she has a cough and some chest congestion. X2 days Pt states that she is vaccinated for covid.  Pt states that she hasn't had flu shot.

## 2021-03-23 NOTE — ED Notes (Signed)
Finished amoxicillin on Wednesday, states cough started on Thursday.  Coughing up yellow sputum.

## 2021-03-24 ENCOUNTER — Other Ambulatory Visit: Payer: Self-pay | Admitting: Family Medicine

## 2021-03-24 DIAGNOSIS — I10 Essential (primary) hypertension: Secondary | ICD-10-CM

## 2021-03-25 NOTE — ED Provider Notes (Signed)
[facility]    CSN: [id number] Arrival date & time: 03/23/21  1135      History   Chief Complaint Chief Complaint  Patient presents with   Ear Problem    Ear pain x3-4 months   Cough    Pt states that she also has a cough and chest congestion. X2 days    HPI Renee Flores is a 55 y.o. female.   The history is provided by the patient. No language interpreter was used.  Cough Cough characteristics:  Non-productive Sputum characteristics:  Nondescript Severity:  Moderate Duration:  2 days Timing:  Constant Progression:  Worsening Chronicity:  New Smoker: no   Relieved by:  Nothing Worsened by:  Nothing Associated symptoms: no chest pain and no fever    Past Medical History:  Diagnosis Date   Allergy    Arthritis    Bulging lumbar disc    E9-9   Complication of anesthesia    anxiety attack after waking up from anesthesia   Factor 5 Leiden mutation, heterozygous ([place])    Factor V deficiency, congenital ([place]) 03/23/2012   GERD (gastroesophageal reflux disease)    Heart murmur    Heart palpitations    Heart valve problem    mild- moderate mitral valve leakage    High cholesterol    HYPERCHOLESTEROLEMIA 02/27/2010   Qualifier: Diagnosis of  By: [name], Denise     Hypersomnia with sleep apnea, unspecified 11/03/2013   Hypertension    Lumbar radiculopathy 08/12/2018   MS (multiple sclerosis) ([place])    questionable   Neuromuscular disorder (HCC)    OSA on CPAP 03/25/2017   Osteopenia 04/2016   T score -1.9   Osteoporosis    Preop cardiovascular exam 03/23/2012   Retrognathia 11/03/2013   Right knee DJD 07/08/2012   Severe obesity (BMI >= 40) ([place]) 11/03/2013   Shortness of breath 06/03/2013   Sleep apnea    Vitamin D deficiency 2014   17    Patient Active Problem List   Diagnosis Date Noted   Morbidly obese ([place]) 03/01/2021   Dependence on CPAP ventilation 03/01/2021   Atypical chest pain 09/19/2020   Sleep apnea    Palpitations     Osteoporosis    Neuromuscular disorder ([place])    MS (multiple sclerosis) ([place])    Heart valve problem    Heart murmur    GERD (gastroesophageal reflux disease)    Factor 5 Leiden mutation, heterozygous ([place])    Complication of anesthesia    Bulging lumbar disc    Arthritis    Allergy    Hypertension 04/13/2020   Lumbar radiculopathy 08/12/2018   OSA on CPAP 03/25/2017   Hypersomnia with sleep apnea, unspecified 11/03/2013   Severe obesity (BMI >= 40) ([place]) 11/03/2013   Retrognathia 11/03/2013   Shortness of breath 06/03/2013   Right knee DJD 07/08/2012    Class: Chronic   Physical exam 03/23/2012   Preop cardiovascular exam 03/23/2012   Factor V deficiency, congenital ([place]) 03/23/2012   Osteopenia    Vitamin D deficiency    HYPERCHOLESTEROLEMIA 02/27/2010    Past Surgical History:  Procedure Laterality Date   ABDOMINAL HYSTERECTOMY     APPENDECTOMY     arthroscopic knee surgery     CESAREAN SECTION     x2   chloecystectomy     CHOLECYSTECTOMY     ESOPHAGEAL DILATION  04/09/2012   KNEE LIGAMENT RECONSTRUCTION     laser throat polyps  Left ankle reconstructed     NECK LESION BIOPSY     PATELLA FRACTURE SURGERY     PELVIC LAPAROSCOPY     LSO   REPLACEMENT TOTAL KNEE Left 2012   REPLACEMENT TOTAL KNEE Right 2014   TOE SURGERY     TONSILLECTOMY     TONSILLECTOMY  1979   TOTAL ABDOMINAL HYSTERECTOMY W/ BILATERAL SALPINGOOPHORECTOMY  2000   TOTAL KNEE ARTHROPLASTY Left 2012   TOTAL KNEE ARTHROPLASTY Right 07/08/2012   Procedure: TOTAL KNEE ARTHROPLASTY with revision components ;  Surgeon: Hessie Dibble, MD;  Location: [facility];  Service: Orthopedics;  Laterality: Right;  PATIENT HAS HX. OF FACTOR IV DISORFER   TUBAL LIGATION      OB History     Gravida  2   Para  2   Term  2   Preterm      AB      Living  2      SAB      IAB      Ectopic      Multiple      Live Births               Home Medications    Prior to Admission medications    Medication Sig Start Date End Date Taking? Authorizing Provider  albuterol (VENTOLIN HFA) 108 (90 Base) MCG/ACT inhaler Inhale 2 puffs into the lungs every 6 (six) hours as needed for wheezing or shortness of breath. 03/23/21  Yes Caryl Ada K, PA-C  amoxicillin-clavulanate (AUGMENTIN) 875-125 MG tablet Take 1 tablet by mouth 2 (two) times daily.   Yes [provider]  aspirin EC 81 MG tablet Take 81 mg by mouth daily.   Yes [provider]  azelastine (ASTELIN) 0.1 % nasal spray Place 1 spray into both nostrils 2 (two) times daily. Use in each nostril as directed 09/28/20  Yes Maximiano Coss, NP  cyclobenzaprine (FLEXERIL) 5 MG tablet Take 1 tablet (5 mg total) by mouth 3 (three) times daily as needed for muscle spasms. 03/01/21  Yes Maximiano Coss, NP  diclofenac (VOLTAREN) 75 MG EC tablet Take 1 tablet (75 mg total) by mouth 2 (two) times daily. 03/01/21  Yes Maximiano Coss, NP  fluticasone (FLONASE) 50 MCG/ACT nasal spray Place 2 sprays into both nostrils daily. 08/06/20  Yes [name], MD  gabapentin (NEURONTIN) 600 MG tablet Take 1 tablet (600 mg total) by mouth at bedtime. 02/08/21  Yes [name], MD  hydrochlorothiazide (HYDRODIURIL) 25 MG tablet Take 1 tablet (25 mg total) by mouth daily. 04/16/20  Yes [name], MD  ketotifen (ZADITOR) 0.025 % ophthalmic solution Place 2 drops into both eyes daily as needed (for irritation). 04/16/20  Yes [name], MD  losartan (COZAAR) 50 MG tablet TAKE 1 TABLET EVERY DAY 02/08/21  Yes [name], MD  meloxicam (MOBIC) 15 MG tablet TAKE 1 TABLET EVERY DAY 02/08/21  Yes [name], MD  metoprolol succinate (TOPROL-XL) 50 MG 24 hr tablet Take 1 tablet (50 mg total) by mouth daily. Patient taking differently: Take 50 mg by mouth 2 (two) times daily. 01/29/21  Yes Park Liter, MD  montelukast (SINGULAIR) 10 MG tablet TAKE 1 TABLET AT BEDTIME 02/19/21  Yes [name], MD  Multiple Vitamins-Minerals (ONE-A-DAY WOMENS 50+ ADVANTAGE PO) Take 1 tablet by mouth daily. Unknown strength   Yes [provider]  ofloxacin (FLOXIN OTIC) 0.3 % OTIC solution Place 5 drops into  both ears daily. 02/25/21  Yes Maximiano Coss, NP  omeprazole (PRILOSEC) 40 MG capsule TAKE 1 CAPSULE EVERY DAY Patient taking differently: Take 40 mg by mouth daily. 01/09/21  Yes [name], MD  ondansetron (ZOFRAN) 4 MG tablet Take 1 tablet (4 mg total) by mouth every 8 (eight) hours as needed for nausea or vomiting. 02/25/21  Yes Maximiano Coss, NP  promethazine-dextromethorphan (PROMETHAZINE-DM) 6.25-15 MG/5ML syrup Take 5 mLs by mouth 2 (two) times daily as needed for cough. 10/28/20  Yes Raspet, Derry Skill, PA-C  simvastatin (ZOCOR) 40 MG tablet TAKE 1 TABLET AT BEDTIME 03/18/21  Yes [name], MD  Calcium Carb-Cholecalciferol (OYSTER SHELL CALCIUM W/D) 500-5 MG-MCG TABS TAKE 1 TABLET EVERY DAY 03/25/21   [name], MD  cloNIDine (CATAPRES) 0.1 MG tablet TAKE 1 TABLET THREE TIMES DAILY 03/25/21   [name], MD    Family History Family History  Problem Relation Age of Onset   Heart disease Father    Hypertension Father    Hyperlipidemia Father    Cancer Father    Other Father        Respiratory problems/Factor 5 disorder/1 bypass, 2 valve replacement (mitral and aortic)    Hypertension Mother    Diabetes Mother    Hyperlipidemia Mother    Hypertension Sister    Colon cancer Paternal Aunt    Breast cancer Paternal Aunt 12   Heart disease Maternal Grandfather    Lymphoma Maternal Grandfather    Heart failure Maternal Grandfather    Colon cancer Paternal Grandmother    Hyperlipidemia Brother    Hypertension Brother    Hypertension Brother    Colon cancer Paternal Aunt    Breast cancer Paternal Aunt 2   Stroke Paternal Grandfather     Social History Social History   Tobacco Use   Smoking status: Never   Smokeless tobacco: Never   Substance Use Topics   Alcohol use: Not Currently    Alcohol/week: 0.0 standard drinks   Drug use: No     Allergies   Erythromycin base, Morphine, Vancomycin hcl, and Doxycycline   Review of Systems Review of Systems  Constitutional:  Negative for fever.  Respiratory:  Positive for cough.   Cardiovascular:  Negative for chest pain.  All other systems reviewed and are negative.   Physical Exam Triage Vital Signs ED Triage Vitals  Enc Vitals Group     BP 03/23/21 1415 (!) 161/87     Pulse Rate 03/23/21 1415 78     Resp 03/23/21 1415 18     Temp 03/23/21 1415 98 F (36.7 C)     Temp Source 03/23/21 1415 Oral     SpO2 03/23/21 1415 92 %     Weight 03/23/21 1402 268 lb (121.6 kg)     Height 03/23/21 1402 5\' 3"  (1.6 m)     Head Circumference --      Peak Flow --      Pain Score 03/23/21 1401 8     Pain Loc --      Pain Edu? --      Excl. in [place]? --    No data found.  Updated Vital Signs BP (!) 161/87 (BP Location: Right Arm)   Pulse 78   Temp 98 F (36.7 C) (Oral)   Resp 18   Ht 5\' 3"  (1.6 m)   Wt 121.6 kg   SpO2 92%   BMI 47.47 kg/m   Visual Acuity Right Eye Distance:  Left Eye Distance:   Bilateral Distance:    Right Eye Near:   Left Eye Near:    Bilateral Near:     Physical Exam Vitals and nursing note reviewed.  Constitutional:      Appearance: She is well-developed.  HENT:     Head: Normocephalic.  Cardiovascular:     Rate and Rhythm: Normal rate.  Pulmonary:     Effort: Pulmonary effort is normal.  Abdominal:     General: There is no distension.  Musculoskeletal:        General: Normal range of motion.     Cervical back: Normal range of motion.  Skin:    General: Skin is warm.  Neurological:     General: No focal deficit present.     Mental Status: She is alert and oriented to person, place, and time.     UC Treatments / Results  Labs (all labs ordered are listed, but only abnormal results are displayed) Labs Reviewed   COVID-19, FLU A+B NAA    EKG   Radiology No results found.  Procedures Procedures (including critical care time)  Medications Ordered in UC Medications - No data to display  Initial Impression / Assessment and Plan / UC Course  I have reviewed the triage vital signs and the nursing notes.  Pertinent labs & imaging results that were available during my care of the patient were reviewed by me and considered in my medical decision making (see chart for details).     MDM:  chest xray  no acute abnormality  Final Clinical Impressions(s) / UC Diagnoses   Final diagnoses:  Acute cough     Discharge Instructions      See your Physician for recheck and referral to ENT.  Use albuterol inhaler.  Your covid and influenza test are pending   ED Prescriptions     Medication Sig Dispense Auth. Provider   albuterol (VENTOLIN HFA) 108 (90 Base) MCG/ACT inhaler Inhale 2 puffs into the lungs every 6 (six) hours as needed for wheezing or shortness of breath. 8 g Fransico Meadow, [place]      PDMP not reviewed this encounter.   Fransico Meadow, [place] 03/25/21 1747

## 2021-03-26 ENCOUNTER — Telehealth: Payer: Self-pay

## 2021-03-26 NOTE — Telephone Encounter (Signed)
   [facility] Pre-operative Risk Assessment    Request for surgical clearance:  What type of surgery is being performed? Left Ankle Arthrodesis, Tendon Achilles Lengthening    When is this surgery scheduled? 04/25/2021   What type of clearance is required (medical clearance vs. Pharmacy clearance to hold med vs. Both)? Both  Are there any medications that need to be held prior to surgery and how long? Not specified however, patient is on ASA 46m   Practice name and name of physician performing surgery? [facility]    What is your office phone number: [phone]   7.   What is your office fax number: [phone].   Anesthesia type (None, local, MAC, general) ? General Anesthesia    JBasil DessPrevatt 03/26/2021, 2:41 PM  _________________________________________________________________   (provider comments below)

## 2021-03-27 LAB — COVID-19, FLU A+B NAA
Influenza A, NAA: DETECTED — AB
Influenza B, NAA: NOT DETECTED
SARS-CoV-2, NAA: NOT DETECTED

## 2021-03-27 NOTE — Telephone Encounter (Signed)
    Patient Name: Renee Flores  DOB: 09/21/1965 MRN: [id number]  Primary Cardiologist: [name], MD  Chart reviewed as part of pre-operative protocol coverage. Patient was last seen by Dr. Agustin Cree on 01/29/2021 at which time she was doing well from a cardiac standpoint. Patient was contacted today for further pre-op evaluation and reported doing well since last visit. She was recently diagnosed with the flu but is starting to feel better. She denies any chest pain or shortness of breath since last visit. Recent Myoview in 09/2020 was low risk with no evidence of ischemia or prior infarct. Given past medical history and time since last visit, based on ACC/AHA guidelines, [name] would be at acceptable risk for the planned procedure without further cardiovascular testing.   Of note, patient takes Aspirin 81mg  daily for Factor V deficiency. It is OK to hold Aspirin from a cardiac standpoint but would also recommend checking with PCP for their recommendations on this.  I will route this recommendation to the requesting party via Epic fax function and remove from pre-op pool.  Please call with questions.  Darreld Mclean, PA-C 03/27/2021, 12:41 PM

## 2021-03-27 NOTE — Telephone Encounter (Signed)
Left message to call back and ask to speak with the pre-op team.  Darreld Mclean, PA-C 03/27/2021 11:31 AM

## 2021-04-02 ENCOUNTER — Telehealth (INDEPENDENT_AMBULATORY_CARE_PROVIDER_SITE_OTHER): Payer: Medicare HMO | Admitting: Family Medicine

## 2021-04-02 DIAGNOSIS — J111 Influenza due to unidentified influenza virus with other respiratory manifestations: Secondary | ICD-10-CM

## 2021-04-02 MED ORDER — PROMETHAZINE-DM 6.25-15 MG/5ML PO SYRP: 5.0000 mL | ORAL_SOLUTION | Freq: Three times a day (TID) | ORAL | 0 refills | Status: DC | PRN

## 2021-04-02 NOTE — Patient Instructions (Addendum)
-  I sent the medication(s) we discussed to your pharmacy: Meds ordered this encounter  Medications   promethazine-dextromethorphan (PROMETHAZINE-DM) 6.25-15 MG/5ML syrup    Sig: Take 5 mLs by mouth 3 (three) times daily as needed for cough.    Dispense:  118 mL    Refill:  0   Nasal saline twice daily  Avoid dairy for 1 week  I hope you are feeling better soon!  Seek in person care promptly if your symptoms worsen, new concerns arise or you are not improving with treatment.  It was nice to meet you today. I help [facility] out with telemedicine visits on Tuesdays and Thursdays and am available for visits on those days. If you have any concerns or questions following this visit please schedule a follow up visit with your Primary Care doctor or seek care at a local urgent care clinic to avoid delays in care.

## 2021-04-02 NOTE — Progress Notes (Signed)
Virtual Visit via Video Note  I connected with Renee Flores  on 04/02/21 at  6:00 PM EST by a video enabled telemedicine application and verified that I am speaking with the correct person using two identifiers.  Location patient: home, [place] Location provider:work or home office Persons participating in the virtual visit: patient, provider  I discussed the limitations of evaluation and management by telemedicine and the availability of in person appointments. The patient expressed understanding and agreed to proceed.   HPI:  Acute telemedicine visit for : -Onset: about  10-12 days ago; she went to the ER and was diagnosed with the flu -Symptoms include: cough, wheezing - has albuterol from the ER, nasal congestion -reports is much better now but still has a cough, occ a little productive -Denies: fever now (had one initially), CP, SOB, sinus discomfort, excessive sputum, malaise -Has tried:  singulair and otc allergy medication -Pertinent past medical history: see below -Pertinent medication allergies:  Allergies  Allergen Reactions   Erythromycin Base Hives   Morphine Other (See Comments)    Hallucinating    Vancomycin Hcl Other (See Comments)    Ringing in ears   Doxycycline Nausea Only and Rash  -COVID-19 vaccine status: Immunization History  Administered Date(s) Administered   Moderna Sars-Covid-2 Vaccination 08/05/2019, 09/01/2019, 04/05/2020   Tdap 04/28/2006    ROS: See pertinent positives and negatives per HPI.  Past Medical History:  Diagnosis Date   Allergy    Arthritis    Bulging lumbar disc    T5-3   Complication of anesthesia    anxiety attack after waking up from anesthesia   Factor 5 Leiden mutation, heterozygous ([place])    Factor V deficiency, congenital ([place]) 03/23/2012   GERD (gastroesophageal reflux disease)    Heart murmur    Heart palpitations    Heart valve problem    mild- moderate mitral valve leakage    High cholesterol    HYPERCHOLESTEROLEMIA  02/27/2010   Qualifier: Diagnosis of  By: [name], Denise     Hypersomnia with sleep apnea, unspecified 11/03/2013   Hypertension    Lumbar radiculopathy 08/12/2018   MS (multiple sclerosis) ([place])    questionable   Neuromuscular disorder (HCC)    OSA on CPAP 03/25/2017   Osteopenia 04/2016   T score -1.9   Osteoporosis    Preop cardiovascular exam 03/23/2012   Retrognathia 11/03/2013   Right knee DJD 07/08/2012   Severe obesity (BMI >= 40) ([place]) 11/03/2013   Shortness of breath 06/03/2013   Sleep apnea    Vitamin D deficiency 2014   17    Past Surgical History:  Procedure Laterality Date   ABDOMINAL HYSTERECTOMY     APPENDECTOMY     arthroscopic knee surgery     CESAREAN SECTION     x2   chloecystectomy     CHOLECYSTECTOMY     ESOPHAGEAL DILATION  04/09/2012   KNEE LIGAMENT RECONSTRUCTION     laser throat polyps     Left ankle reconstructed     NECK LESION BIOPSY     PATELLA FRACTURE SURGERY     PELVIC LAPAROSCOPY     LSO   REPLACEMENT TOTAL KNEE Left 2012   REPLACEMENT TOTAL KNEE Right 2014   TOE SURGERY     TONSILLECTOMY     TONSILLECTOMY  1979   TOTAL ABDOMINAL HYSTERECTOMY W/ BILATERAL SALPINGOOPHORECTOMY  2000   TOTAL KNEE ARTHROPLASTY Left 2012   TOTAL KNEE ARTHROPLASTY Right 07/08/2012   Procedure: TOTAL KNEE ARTHROPLASTY  with revision components ;  Surgeon: Hessie Dibble, MD;  Location: [facility];  Service: Orthopedics;  Laterality: Right;  PATIENT HAS HX. OF FACTOR IV DISORFER   TUBAL LIGATION       Current Outpatient Medications:    promethazine-dextromethorphan (PROMETHAZINE-DM) 6.25-15 MG/5ML syrup, Take 5 mLs by mouth 3 (three) times daily as needed for cough., Disp: 118 mL, Rfl: 0   albuterol (VENTOLIN HFA) 108 (90 Base) MCG/ACT inhaler, Inhale 2 puffs into the lungs every 6 (six) hours as needed for wheezing or shortness of breath., Disp: 8 g, Rfl: 2   amoxicillin-clavulanate (AUGMENTIN) 875-125 MG tablet, Take 1 tablet by mouth 2 (two) times daily.,  Disp: , Rfl:    aspirin EC 81 MG tablet, Take 81 mg by mouth daily., Disp: , Rfl:    azelastine (ASTELIN) 0.1 % nasal spray, Place 1 spray into both nostrils 2 (two) times daily. Use in each nostril as directed, Disp: 30 mL, Rfl: 12   Calcium Carb-Cholecalciferol (OYSTER SHELL CALCIUM W/D) 500-5 MG-MCG TABS, TAKE 1 TABLET EVERY DAY, Disp: 90 tablet, Rfl: 0   cloNIDine (CATAPRES) 0.1 MG tablet, TAKE 1 TABLET THREE TIMES DAILY, Disp: 270 tablet, Rfl: 0   cyclobenzaprine (FLEXERIL) 5 MG tablet, Take 1 tablet (5 mg total) by mouth 3 (three) times daily as needed for muscle spasms., Disp: 30 tablet, Rfl: 1   diclofenac (VOLTAREN) 75 MG EC tablet, Take 1 tablet (75 mg total) by mouth 2 (two) times daily., Disp: 30 tablet, Rfl: 0   fluticasone (FLONASE) 50 MCG/ACT nasal spray, Place 2 sprays into both nostrils daily., Disp: 16 g, Rfl: 6   gabapentin (NEURONTIN) 600 MG tablet, Take 1 tablet (600 mg total) by mouth at bedtime., Disp: 30 tablet, Rfl: 0   hydrochlorothiazide (HYDRODIURIL) 25 MG tablet, Take 1 tablet (25 mg total) by mouth daily., Disp: 90 tablet, Rfl: 0   ketotifen (ZADITOR) 0.025 % ophthalmic solution, Place 2 drops into both eyes daily as needed (for irritation)., Disp: 5 mL, Rfl: 0   losartan (COZAAR) 50 MG tablet, TAKE 1 TABLET EVERY DAY, Disp: 90 tablet, Rfl: 0   meloxicam (MOBIC) 15 MG tablet, TAKE 1 TABLET EVERY DAY, Disp: 90 tablet, Rfl: 0   metoprolol succinate (TOPROL-XL) 50 MG 24 hr tablet, Take 1 tablet (50 mg total) by mouth daily. (Patient taking differently: Take 50 mg by mouth 2 (two) times daily.), Disp: 90 tablet, Rfl: 1   montelukast (SINGULAIR) 10 MG tablet, TAKE 1 TABLET AT BEDTIME, Disp: 90 tablet, Rfl: 0   Multiple Vitamins-Minerals (ONE-A-DAY WOMENS 50+ ADVANTAGE PO), Take 1 tablet by mouth daily. Unknown strength, Disp: , Rfl:    ofloxacin (FLOXIN OTIC) 0.3 % OTIC solution, Place 5 drops into both ears daily., Disp: 5 mL, Rfl: 0   omeprazole (PRILOSEC) 40 MG capsule,  TAKE 1 CAPSULE EVERY DAY (Patient taking differently: Take 40 mg by mouth daily.), Disp: 90 capsule, Rfl: 0   ondansetron (ZOFRAN) 4 MG tablet, Take 1 tablet (4 mg total) by mouth every 8 (eight) hours as needed for nausea or vomiting., Disp: 20 tablet, Rfl: 0   simvastatin (ZOCOR) 40 MG tablet, TAKE 1 TABLET AT BEDTIME, Disp: 90 tablet, Rfl: 0  EXAM:  VITALS per patient if applicable:  GENERAL: alert, oriented, appears well and in no acute distress  HEENT: atraumatic, conjunttiva clear, no obvious abnormalities on inspection of external nose and ears  NECK: normal movements of the head and neck  LUNGS: on inspection no signs of  respiratory distress, breathing rate appears normal, no obvious gross SOB, gasping or wheezing  CV: no obvious cyanosis  MS: moves all visible extremities without noticeable abnormality  PSYCH/NEURO: pleasant and cooperative, no obvious depression or anxiety, speech and thought processing grossly intact  ASSESSMENT AND PLAN:  Discussed the following assessment and plan:  Influenza  -we discussed possible serious and likely etiologies, options for evaluation and workup, limitations of telemedicine visit vs in person visit, treatment, treatment risks and precautions. Pt is agreeable to treatment via telemedicine at this moment. Suspect post viral cough most likely vs other. She is requesting promethazine cough syrup. We also discussed risks benefits of abx - she just took augmentin for and ear issue a few weeks ago. Opted to hold off on abx for now consistent with current abx guidelines as is likely viral. Sen cough rx and advised nasal saline and avoiding dairy for 1 week.  Advised to seek prompt vv follow up or in person care if worsening, new symptoms arise, or if is not improving with treatment. Discussed options for care if PCP office not available. Did let this patient know that I  do telemedicine on Tuesdays and Thursdays for [place].  I discussed the  assessment and treatment plan with the patient. The patient was provided an opportunity to ask questions and all were answered. The patient agreed with the plan and demonstrated an understanding of the instructions.     Lucretia Kern, DO

## 2021-04-16 DIAGNOSIS — H9201 Otalgia, right ear: Secondary | ICD-10-CM | POA: Diagnosis not present

## 2021-04-25 DIAGNOSIS — M19072 Primary osteoarthritis, left ankle and foot: Secondary | ICD-10-CM | POA: Diagnosis not present

## 2021-04-25 DIAGNOSIS — M24572 Contracture, left ankle: Secondary | ICD-10-CM | POA: Diagnosis not present

## 2021-04-25 HISTORY — PX: ANKLE FUSION: SHX881

## 2021-04-27 ENCOUNTER — Emergency Department (HOSPITAL_BASED_OUTPATIENT_CLINIC_OR_DEPARTMENT_OTHER)
Admission: EM | Admit: 2021-04-27 | Discharge: 2021-04-28 | Disposition: A | Discharge status: Home / Self Care | Payer: Medicare HMO | Attending: Emergency Medicine | Admitting: Emergency Medicine

## 2021-04-27 ENCOUNTER — Encounter (HOSPITAL_BASED_OUTPATIENT_CLINIC_OR_DEPARTMENT_OTHER): Payer: Self-pay | Admitting: Emergency Medicine

## 2021-04-27 ENCOUNTER — Emergency Department (HOSPITAL_BASED_OUTPATIENT_CLINIC_OR_DEPARTMENT_OTHER): Payer: Medicare HMO

## 2021-04-27 ENCOUNTER — Other Ambulatory Visit: Payer: Self-pay

## 2021-04-27 DIAGNOSIS — Z96653 Presence of artificial knee joint, bilateral: Secondary | ICD-10-CM | POA: Diagnosis not present

## 2021-04-27 DIAGNOSIS — M7989 Other specified soft tissue disorders: Secondary | ICD-10-CM | POA: Diagnosis not present

## 2021-04-27 DIAGNOSIS — I10 Essential (primary) hypertension: Secondary | ICD-10-CM | POA: Diagnosis not present

## 2021-04-27 DIAGNOSIS — R2242 Localized swelling, mass and lump, left lower limb: Secondary | ICD-10-CM | POA: Diagnosis not present

## 2021-04-27 DIAGNOSIS — Z79899 Other long term (current) drug therapy: Secondary | ICD-10-CM | POA: Insufficient documentation

## 2021-04-27 DIAGNOSIS — G8918 Other acute postprocedural pain: Secondary | ICD-10-CM | POA: Diagnosis not present

## 2021-04-27 DIAGNOSIS — M25572 Pain in left ankle and joints of left foot: Secondary | ICD-10-CM | POA: Diagnosis not present

## 2021-04-27 LAB — CBC WITH DIFFERENTIAL/PLATELET
Abs Immature Granulocytes: 0.03 10*3/uL (ref 0.00–0.07)
Basophils Absolute: 0.1 10*3/uL (ref 0.0–0.1)
Basophils Relative: 1 %
Eosinophils Absolute: 0.5 10*3/uL (ref 0.0–0.5)
Eosinophils Relative: 5 %
HCT: 36.1 % (ref 36.0–46.0)
Hemoglobin: 11.5 g/dL — ABNORMAL LOW (ref 12.0–15.0)
Immature Granulocytes: 0 %
Lymphocytes Relative: 32 %
Lymphs Abs: 3.3 10*3/uL (ref 0.7–4.0)
MCH: 28.3 pg (ref 26.0–34.0)
MCHC: 31.9 g/dL (ref 30.0–36.0)
MCV: 88.9 fL (ref 80.0–100.0)
Monocytes Absolute: 1 10*3/uL (ref 0.1–1.0)
Monocytes Relative: 9 %
Neutro Abs: 5.3 10*3/uL (ref 1.7–7.7)
Neutrophils Relative %: 53 %
Platelets: 269 10*3/uL (ref 150–400)
RBC: 4.06 MIL/uL (ref 3.87–5.11)
RDW: 13.5 % (ref 11.5–15.5)
WBC: 10.2 10*3/uL (ref 4.0–10.5)
nRBC: 0 % (ref 0.0–0.2)

## 2021-04-27 LAB — BASIC METABOLIC PANEL
Anion gap: 11 (ref 5–15)
BUN: 7 mg/dL (ref 6–20)
CO2: 28 mmol/L (ref 22–32)
Calcium: 9.4 mg/dL (ref 8.9–10.3)
Chloride: 101 mmol/L (ref 98–111)
Creatinine, Ser: 0.64 mg/dL (ref 0.44–1.00)
GFR, Estimated: >60 mL/min (ref >60)
Glucose, Bld: 112 mg/dL — ABNORMAL HIGH (ref 70–99)
Potassium: 3.3 mmol/L — ABNORMAL LOW (ref 3.5–5.1)
Sodium: 140 mmol/L (ref 135–145)

## 2021-04-27 LAB — LACTIC ACID, PLASMA: Lactic Acid, Venous: 2.6 mmol/L (ref 0.5–1.9)

## 2021-04-27 MED ORDER — SODIUM CHLORIDE 0.9 % IV BOLUS
1000.0000 mL | Freq: Once | INTRAVENOUS | Status: AC
  Administered 2021-04-27: 1000 mL via INTRAVENOUS

## 2021-04-27 NOTE — ED Triage Notes (Addendum)
Reports Left ankle fusion on Thursday, reports increased pain and swelling. Splint in place. Cap refill 3-4 seconds. Low grade temp 100.4 today.

## 2021-04-27 NOTE — ED Notes (Signed)
Dr. Wyvonnia Dusky aware of elevated lactic acid of 2.6

## 2021-04-27 NOTE — ED Provider Notes (Signed)
[facility] EMERGENCY DEPT Provider Note   CSN: [id number] Arrival date & time: 04/27/21  2211     History Chief Complaint  Patient presents with   Ankle Pain    Renee Flores is a 55 y.o. female.  Patient presents with left ankle pain, swelling and tightness to her calf.  She underwent ankle fusion 2 days ago by Dr. Lucia Gaskins of [facility] orthopedics.  States she is keeping the foot elevated.  She is taking oxycodone for pain.  She feels tightness in her calf and feels like her splint is too tight.  She did have some tingling in her toes earlier today but this is resolved.  She has been keeping her leg elevated.  She denies any weakness.  Denies any numbness currently.  Denies any bleeding or drainage.  Did have a temperature of 100.4 earlier today took Tylenol for it. She is concerned that the leg is becoming more swollen over the past 2 days. No chest pain or shortness of breath. She does have a history of factor V Leiden deficiency and takes aspirin but no other anticoagulation.  States she is never taken any anticoagulation other than aspirin.  The history is provided by the patient.  Ankle Pain Associated symptoms: fever       Past Medical History:  Diagnosis Date   Allergy    Arthritis    Bulging lumbar disc    J1-7   Complication of anesthesia    anxiety attack after waking up from anesthesia   Factor 5 Leiden mutation, heterozygous ([place])    Factor V deficiency, congenital ([place]) 03/23/2012   GERD (gastroesophageal reflux disease)    Heart murmur    Heart palpitations    Heart valve problem    mild- moderate mitral valve leakage    High cholesterol    HYPERCHOLESTEROLEMIA 02/27/2010   Qualifier: Diagnosis of  By: [name], Denise     Hypersomnia with sleep apnea, unspecified 11/03/2013   Hypertension    Lumbar radiculopathy 08/12/2018   MS (multiple sclerosis) ([place])    questionable   Neuromuscular disorder (HCC)    OSA on CPAP 03/25/2017    Osteopenia 04/2016   T score -1.9   Osteoporosis    Preop cardiovascular exam 03/23/2012   Retrognathia 11/03/2013   Right knee DJD 07/08/2012   Severe obesity (BMI >= 40) (HCC) 11/03/2013   Shortness of breath 06/03/2013   Sleep apnea    Vitamin D deficiency 2014   17    Patient Active Problem List   Diagnosis Date Noted   Morbidly obese ([place]) 03/01/2021   Dependence on CPAP ventilation 03/01/2021   Atypical chest pain 09/19/2020   Sleep apnea    Palpitations    Osteoporosis    Neuromuscular disorder ([place])    MS (multiple sclerosis) (HCC)    Heart valve problem    Heart murmur    GERD (gastroesophageal reflux disease)    Factor 5 Leiden mutation, heterozygous ([place])    Complication of anesthesia    Bulging lumbar disc    Arthritis    Allergy    Hypertension 04/13/2020   Lumbar radiculopathy 08/12/2018   OSA on CPAP 03/25/2017   Hypersomnia with sleep apnea, unspecified 11/03/2013   Severe obesity (BMI >= 40) ([place]) 11/03/2013   Retrognathia 11/03/2013   Shortness of breath 06/03/2013   Right knee DJD 07/08/2012    Class: Chronic   Physical exam 03/23/2012   Preop cardiovascular exam 03/23/2012   Factor V deficiency,  congenital ([place]) 03/23/2012   Osteopenia    Vitamin D deficiency    HYPERCHOLESTEROLEMIA 02/27/2010    Past Surgical History:  Procedure Laterality Date   ABDOMINAL HYSTERECTOMY     ANKLE FUSION Left 04/25/2021   APPENDECTOMY     arthroscopic knee surgery     CESAREAN SECTION     x2   chloecystectomy     CHOLECYSTECTOMY     ESOPHAGEAL DILATION  04/09/2012   KNEE LIGAMENT RECONSTRUCTION     laser throat polyps     Left ankle reconstructed     NECK LESION BIOPSY     PATELLA FRACTURE SURGERY     PELVIC LAPAROSCOPY     LSO   REPLACEMENT TOTAL KNEE Left 2012   REPLACEMENT TOTAL KNEE Right 2014   TOE SURGERY     TONSILLECTOMY     TONSILLECTOMY  1979   TOTAL ABDOMINAL HYSTERECTOMY W/ BILATERAL SALPINGOOPHORECTOMY  2000   TOTAL KNEE ARTHROPLASTY  Left 2012   TOTAL KNEE ARTHROPLASTY Right 07/08/2012   Procedure: TOTAL KNEE ARTHROPLASTY with revision components ;  Surgeon: Hessie Dibble, MD;  Location: [facility];  Service: Orthopedics;  Laterality: Right;  PATIENT HAS HX. OF FACTOR IV DISORFER   TUBAL LIGATION       OB History     Gravida  2   Para  2   Term  2   Preterm      AB      Living  2      SAB      IAB      Ectopic      Multiple      Live Births              Family History  Problem Relation Age of Onset   Heart disease Father    Hypertension Father    Hyperlipidemia Father    Cancer Father    Other Father        Respiratory problems/Factor 5 disorder/1 bypass, 2 valve replacement (mitral and aortic)    Hypertension Mother    Diabetes Mother    Hyperlipidemia Mother    Hypertension Sister    Colon cancer Paternal Aunt    Breast cancer Paternal Aunt 8   Heart disease Maternal Grandfather    Lymphoma Maternal Grandfather    Heart failure Maternal Grandfather    Colon cancer Paternal Grandmother    Hyperlipidemia Brother    Hypertension Brother    Hypertension Brother    Colon cancer Paternal Aunt    Breast cancer Paternal Aunt 28   Stroke Paternal Grandfather     Social History   Tobacco Use   Smoking status: Never   Smokeless tobacco: Never  Substance Use Topics   Alcohol use: Not Currently    Alcohol/week: 0.0 standard drinks   Drug use: No    Home Medications Prior to Admission medications   Medication Sig Start Date End Date Taking? Authorizing Provider  albuterol (VENTOLIN HFA) 108 (90 Base) MCG/ACT inhaler Inhale 2 puffs into the lungs every 6 (six) hours as needed for wheezing or shortness of breath. 03/23/21   Fransico Meadow, PA-C  amoxicillin-clavulanate (AUGMENTIN) 875-125 MG tablet Take 1 tablet by mouth 2 (two) times daily.    [provider]  aspirin EC 81 MG tablet Take 81 mg by mouth daily.    [provider]  azelastine (ASTELIN) 0.1 % nasal  spray Place 1 spray into both nostrils 2 (two) times daily. Use  in each nostril as directed 09/28/20   Maximiano Coss, NP  Calcium Carb-Cholecalciferol (OYSTER SHELL CALCIUM W/D) 500-5 MG-MCG TABS TAKE 1 TABLET EVERY DAY 03/25/21   [name], MD  cloNIDine (CATAPRES) 0.1 MG tablet TAKE 1 TABLET THREE TIMES DAILY 03/25/21   [name], MD  cyclobenzaprine (FLEXERIL) 5 MG tablet Take 1 tablet (5 mg total) by mouth 3 (three) times daily as needed for muscle spasms. 03/01/21   Maximiano Coss, NP  diclofenac (VOLTAREN) 75 MG EC tablet Take 1 tablet (75 mg total) by mouth 2 (two) times daily. 03/01/21   Maximiano Coss, NP  fluticasone (FLONASE) 50 MCG/ACT nasal spray Place 2 sprays into both nostrils daily. 08/06/20   [name], MD  gabapentin (NEURONTIN) 600 MG tablet Take 1 tablet (600 mg total) by mouth at bedtime. 02/08/21   [name], MD  hydrochlorothiazide (HYDRODIURIL) 25 MG tablet Take 1 tablet (25 mg total) by mouth daily. 04/16/20   [name], MD  ketotifen (ZADITOR) 0.025 % ophthalmic solution Place 2 drops into both eyes daily as needed (for irritation). 04/16/20   [name], MD  losartan (COZAAR) 50 MG tablet TAKE 1 TABLET EVERY DAY 02/08/21   [name], MD  meloxicam (MOBIC) 15 MG tablet TAKE 1 TABLET EVERY DAY 02/08/21   [name], MD  metoprolol succinate (TOPROL-XL) 50 MG 24 hr tablet Take 1 tablet (50 mg total) by mouth daily. Patient taking differently: Take 50 mg by mouth 2 (two) times daily. 01/29/21   Park Liter, MD  montelukast (SINGULAIR) 10 MG tablet TAKE 1 TABLET AT BEDTIME 02/19/21   [name], MD  Multiple Vitamins-Minerals (ONE-A-DAY WOMENS 50+ ADVANTAGE PO) Take 1 tablet by mouth daily. Unknown strength    [provider]  ofloxacin (FLOXIN OTIC) 0.3 % OTIC solution Place 5 drops into both ears daily. 02/25/21   Maximiano Coss, NP  omeprazole (PRILOSEC) 40 MG capsule TAKE  1 CAPSULE EVERY DAY Patient taking differently: Take 40 mg by mouth daily. 01/09/21   [name], MD  ondansetron (ZOFRAN) 4 MG tablet Take 1 tablet (4 mg total) by mouth every 8 (eight) hours as needed for nausea or vomiting. 02/25/21   Maximiano Coss, NP  promethazine-dextromethorphan (PROMETHAZINE-DM) 6.25-15 MG/5ML syrup Take 5 mLs by mouth 3 (three) times daily as needed for cough. 04/02/21   Lucretia Kern, DO  simvastatin (ZOCOR) 40 MG tablet TAKE 1 TABLET AT BEDTIME 03/18/21   [name], MD    Allergies    Erythromycin base, Morphine, Vancomycin hcl, and Doxycycline  Review of Systems   Review of Systems  Constitutional:  Positive for fever. Negative for activity change and appetite change.  HENT:  Negative for congestion and rhinorrhea.   Respiratory:  Negative for cough, chest tightness and shortness of breath.   Cardiovascular:  Negative for chest pain.  Gastrointestinal:  Negative for nausea and vomiting.  Genitourinary:  Negative for dysuria.  Musculoskeletal:  Positive for arthralgias and myalgias.  Skin:  Negative for rash.  Neurological:  Positive for numbness. Negative for dizziness and weakness.   all other systems are negative except as noted in the HPI and PMH.   Physical Exam Updated Vital Signs BP (!) 158/109 (BP Location: Right Arm)    Pulse (!) 117    Temp 98.8 F (37.1 C)    Resp 18    Ht _0  (1.6 m)    Wt 120.2 kg  SpO2 96%    BMI 46.94 kg/m   Physical Exam Vitals and nursing note reviewed.  Constitutional:      General: She is not in acute distress.    Appearance: She is well-developed.  HENT:     Head: Normocephalic and atraumatic.     Mouth/Throat:     Pharynx: No oropharyngeal exudate.  Eyes:     Conjunctiva/sclera: Conjunctivae normal.     Pupils: Pupils are equal, round, and reactive to light.  Neck:     Comments: No meningismus. Cardiovascular:     Rate and Rhythm: Normal rate and regular rhythm.     Heart sounds:  Normal heart sounds. No murmur heard. Pulmonary:     Effort: Pulmonary effort is normal. No respiratory distress.     Breath sounds: Normal breath sounds.  Abdominal:     Palpations: Abdomen is soft.     Tenderness: There is no abdominal tenderness. There is no guarding or rebound.  Musculoskeletal:        General: Swelling and tenderness present.     Cervical back: Normal range of motion and neck supple.     Comments: Ace bandages taken down. Plaster cast and bulky dressing in place to left lower leg. Capillary refill intact to distal toes.  Able to move all toes.  Capillary refill less than 3 seconds.  Calf compartments are soft Unable to assess pulses due to splint presence Full range of motion of left hip and left knee  Skin:    General: Skin is warm.  Neurological:     Mental Status: She is alert and oriented to person, place, and time.     Cranial Nerves: No cranial nerve deficit.     Motor: No abnormal muscle tone.     Coordination: Coordination normal.     Comments:  5/5 strength throughout. CN 2-12 intact.Equal grip strength.   Psychiatric:        Behavior: Behavior normal.    ED Results / Procedures / Treatments   Labs (all labs ordered are listed, but only abnormal results are displayed) Labs Reviewed  CBC WITH DIFFERENTIAL/PLATELET - Abnormal; Notable for the following components:      Result Value   Hemoglobin 11.5 (*)    All other components within normal limits  BASIC METABOLIC PANEL - Abnormal; Notable for the following components:   Potassium 3.3 (*)    Glucose, Bld 112 (*)    All other components within normal limits  SEDIMENTATION RATE - Abnormal; Notable for the following components:   Sed Rate 52 (*)    All other components within normal limits  LACTIC ACID, PLASMA - Abnormal; Notable for the following components:   Lactic Acid, Venous 2.6 (*)    All other components within normal limits  LACTIC ACID, PLASMA  C-REACTIVE PROTEIN     EKG None  Radiology DG Ankle Complete Left  Result Date: 04/27/2021 CLINICAL DATA:  Fusion procedure 2 days ago, with increasing left ankle pain and swelling. EXAM: LEFT ANKLE COMPLETE - 3+ VIEW COMPARISON:  The preoperative AP and lateral ankle series 01/25/2021. FINDINGS: Anterior plate arthrodesis hardware is now seen extending over the distal tibia and superior aspect of the neck and anterior process of the talus with securing screws. One of the securing screws projects in the subtalar joint space on the lateral view. A separate threaded screw extends through the medial aspect of the distal tibial metaphysis into the talus oriented obliquely. There are old threaded screws in the  calcaneus. Overlying plaster casting material obscures fine detail. The joint space is still visible but markedly narrowed. No displaced fracture or hardware loosening is seen. Moderate generalized soft tissue edema is increased over the ankle and foot. IMPRESSION: Arthrodesis hardware in place without evidence of loosening. No fracture is seen through the plaster casting material, which obscures fine detail. At least one of the securing screws for the plate hardware projects in the subtalar joint space. There is increased soft tissue prominence. Electronically Signed   By: Telford Nab M.D.   On: 04/27/2021 23:04   CT Angio Chest PE W and/or Wo Contrast  Result Date: 04/28/2021 CLINICAL DATA:  Recent ankle surgery with shortness of breath. EXAM: CT ANGIOGRAPHY CHEST WITH CONTRAST TECHNIQUE: Multidetector CT imaging of the chest was performed using the standard protocol during bolus administration of intravenous contrast. Multiplanar CT image reconstructions and MIPs were obtained to evaluate the vascular anatomy. CONTRAST:  14m OMNIPAQUE IOHEXOL 350 MG/ML SOLN COMPARISON:  June 01, 2013 FINDINGS: Cardiovascular: Satisfactory opacification of the pulmonary arteries to the segmental level. No evidence of pulmonary  embolism. Normal heart size. No pericardial effusion. Mediastinum/Nodes: No enlarged mediastinal, hilar, or axillary lymph nodes. Thyroid gland, trachea, and esophagus demonstrate no significant findings. Lungs/Pleura: Lungs are clear. No pleural effusion or pneumothorax. Upper Abdomen: Open there is a small hiatal hernia. Multiple surgical clips are seen within the gallbladder fossa. Musculoskeletal: No chest wall abnormality. No acute or significant osseous findings. Review of the MIP images confirms the above findings. IMPRESSION: 1. No CT evidence of pulmonary embolism. 2. Small hiatal hernia. 3. Evidence of prior cholecystectomy. Electronically Signed   By: TVirgina NorfolkM.D.   On: 04/28/2021 03:09    Procedures Procedures   Medications Ordered in ED Medications - No data to display  ED Course  I have reviewed the triage vital signs and the nursing notes.  Pertinent labs & imaging results that were available during my care of the patient were reviewed by me and considered in my medical decision making (see chart for details).    MDM Rules/Calculators/A&P                         Left leg pain and swelling after ankle fusion 2 days ago.  Temperature 100.4 at home  Mild tachycardia on arrival.  No hypoxia or increased work of breathing. Foot is warm and well-perfused.  Able to wiggle toes.  Compartments are soft. Labs show mild lactic acidosis and ESR elevation. X-ray shows surgical changes  D/w Dr. RMayer Camelon call for Dr. ALucia Gaskins  He agrees that temperature of 100.4 is not unexpected given recent surgery. Does not recommend bulky dressing be taken down completely now that she has soft compartments and distal perfusion intact. No numbness or tingling.  She is mildly tachycardic but not short of breath.  No chest pain. Likely too early to suspect infection or clot. He recommends pain control and further IV hydration. He states he could switch oxycodone to dilaudid if necessary. Patient  can follow-up in the office on Tuesday.  Patient did become short of breath and tachycardic upon returning from the bathroom.  CT scan obtained to rule out pulmonary embolism given her factor V Leiden history.  This is negative.  Patient feeling improved.  Remains mildly tachycardic around 100 but did not take her beta-blocker yesterday.  Lactate has normalized. Pain is controlled.  Discussed follow-up with orthopedics on Tuesday with Dr. RMayer Camel  Low  suspicion for septic joint, acute limb ischemia, compartment syndrome, postoperative infection. DVT is a possibility outpatient ultrasound will be scheduled.  Discussed return to the ED with worsening pain, weakness, numbness, tingling, pain with movement of her toes, temperature change, redness, swelling, chest pain, shortness of breath, or any other concerns      Final Clinical Impression(s) / ED Diagnoses Final diagnoses:  Post-op pain  Leg swelling    Rx / DC Orders ED Discharge Orders     None        Camari Quintanilla, Annie Main, MD 04/28/21 0403

## 2021-04-28 ENCOUNTER — Emergency Department (HOSPITAL_BASED_OUTPATIENT_CLINIC_OR_DEPARTMENT_OTHER): Payer: Medicare HMO

## 2021-04-28 DIAGNOSIS — R0602 Shortness of breath: Secondary | ICD-10-CM | POA: Diagnosis not present

## 2021-04-28 DIAGNOSIS — Z96653 Presence of artificial knee joint, bilateral: Secondary | ICD-10-CM | POA: Diagnosis not present

## 2021-04-28 DIAGNOSIS — K449 Diaphragmatic hernia without obstruction or gangrene: Secondary | ICD-10-CM | POA: Diagnosis not present

## 2021-04-28 DIAGNOSIS — I10 Essential (primary) hypertension: Secondary | ICD-10-CM | POA: Diagnosis not present

## 2021-04-28 DIAGNOSIS — M7989 Other specified soft tissue disorders: Secondary | ICD-10-CM | POA: Diagnosis not present

## 2021-04-28 DIAGNOSIS — Z79899 Other long term (current) drug therapy: Secondary | ICD-10-CM | POA: Diagnosis not present

## 2021-04-28 DIAGNOSIS — G8918 Other acute postprocedural pain: Secondary | ICD-10-CM | POA: Diagnosis not present

## 2021-04-28 LAB — C-REACTIVE PROTEIN: CRP: 6.2 mg/dL — ABNORMAL HIGH (ref <1.0)

## 2021-04-28 LAB — SEDIMENTATION RATE: Sed Rate: 52 mm/hr — ABNORMAL HIGH (ref 0–22)

## 2021-04-28 LAB — LACTIC ACID, PLASMA: Lactic Acid, Venous: 1.4 mmol/L (ref 0.5–1.9)

## 2021-04-28 MED ORDER — IOHEXOL 350 MG/ML SOLN
100.0000 mL | Freq: Once | INTRAVENOUS | Status: AC | PRN
  Administered 2021-04-28: 100 mL via INTRAVENOUS

## 2021-04-28 MED ORDER — LORAZEPAM 1 MG PO TABS
1.0000 mg | ORAL_TABLET | Freq: Once | ORAL | Status: AC
  Administered 2021-04-28: 1 mg via ORAL
  Filled 2021-04-28: qty 1

## 2021-04-28 NOTE — Discharge Instructions (Signed)
Keep your leg elevated and take your pain medication as prescribed.  Follow-up on Tuesday with Dr. Damita Dunnings for recheck of your leg and your surgical wound.  Call the attached number to schedule an ultrasound of your leg to rule out a blood clot.  Return to the ED with worsening pain, weakness, numbness, tingling, pain with moving her toes, difficulty breathing, chest pain or any other concerns

## 2021-04-28 NOTE — ED Notes (Signed)
Patient transported to CT 

## 2021-05-10 DIAGNOSIS — Z9889 Other specified postprocedural states: Secondary | ICD-10-CM | POA: Diagnosis not present

## 2021-05-10 DIAGNOSIS — M19072 Primary osteoarthritis, left ankle and foot: Secondary | ICD-10-CM | POA: Diagnosis not present

## 2021-05-11 ENCOUNTER — Other Ambulatory Visit: Payer: Self-pay | Admitting: Family Medicine

## 2021-05-11 DIAGNOSIS — R52 Pain, unspecified: Secondary | ICD-10-CM

## 2021-06-05 ENCOUNTER — Other Ambulatory Visit: Payer: Self-pay | Admitting: Family Medicine

## 2021-06-05 DIAGNOSIS — K219 Gastro-esophageal reflux disease without esophagitis: Secondary | ICD-10-CM

## 2021-06-05 DIAGNOSIS — M19072 Primary osteoarthritis, left ankle and foot: Secondary | ICD-10-CM | POA: Diagnosis not present

## 2021-06-05 DIAGNOSIS — Z9889 Other specified postprocedural states: Secondary | ICD-10-CM | POA: Diagnosis not present

## 2021-06-11 DIAGNOSIS — H903 Sensorineural hearing loss, bilateral: Secondary | ICD-10-CM | POA: Diagnosis not present

## 2021-06-11 DIAGNOSIS — H9201 Otalgia, right ear: Secondary | ICD-10-CM | POA: Diagnosis not present

## 2021-06-17 ENCOUNTER — Telehealth: Payer: Self-pay | Admitting: Family Medicine

## 2021-06-17 NOTE — Chronic Care Management (AMB) (Signed)
°  Chronic Care Management   Outreach Note  06/17/2021 Name: SARY BOGIE MRN: [id number] DOB: 1965/08/22  Referred by: [name], MD Reason for referral : No chief complaint on file.   An unsuccessful telephone outreach was attempted today. The patient was referred to the pharmacist for assistance with care management and care coordination.   Follow Up Plan:   Tatjana Dellinger Upstream Scheduler

## 2021-06-17 NOTE — Progress Notes (Signed)
°  Chronic Care Management   Note  06/17/2021 Name: CAMIRA GEIDEL MRN: [id number] DOB: [birth year]  RICKA WESTRA is a 55 y.o. year old female who is a primary care patient of Birdie Riddle, Aundra Millet, MD. I reached out to Bari Mantis by phone today in response to a referral sent by Ms. Berneta Sages [place]'s PCP, [name], MD.   Ms. Puleo was given information about Chronic Care Management services today including:  CCM service includes personalized support from designated clinical staff supervised by her physician, including individualized plan of care and coordination with other care providers 24/7 contact phone numbers for assistance for urgent and routine care needs. Service will only be billed when office clinical staff spend 20 minutes or more in a month to coordinate care. Only one practitioner may furnish and bill the service in a calendar month. The patient may stop CCM services at any time (effective at the end of the month) by phone call to the office staff.   Patient agreed to services and verbal consent obtained.   Follow up plan:   Tatjana Secretary/administrator

## 2021-06-19 ENCOUNTER — Ambulatory Visit: Payer: Medicare HMO | Admitting: Cardiology

## 2021-06-19 ENCOUNTER — Other Ambulatory Visit: Payer: Self-pay

## 2021-06-19 ENCOUNTER — Encounter: Payer: Self-pay | Admitting: Cardiology

## 2021-06-19 VITALS — BP 168/92 | HR 84 | Ht 63.0 in | Wt 265.0 lb

## 2021-06-19 DIAGNOSIS — Z9989 Dependence on other enabling machines and devices: Secondary | ICD-10-CM

## 2021-06-19 DIAGNOSIS — I10 Essential (primary) hypertension: Secondary | ICD-10-CM

## 2021-06-19 DIAGNOSIS — D6851 Activated protein C resistance: Secondary | ICD-10-CM

## 2021-06-19 DIAGNOSIS — G4733 Obstructive sleep apnea (adult) (pediatric): Secondary | ICD-10-CM

## 2021-06-19 DIAGNOSIS — R0789 Other chest pain: Secondary | ICD-10-CM | POA: Diagnosis not present

## 2021-06-19 MED ORDER — LOSARTAN POTASSIUM 50 MG PO TABS: 50.0000 mg | ORAL_TABLET | Freq: Two times a day (BID) | ORAL | 3 refills | Status: DC

## 2021-06-19 NOTE — Addendum Note (Signed)
Addended by: Edwyna Shell I on: 06/19/2021 04:59 PM   Modules accepted: Orders

## 2021-06-19 NOTE — Patient Instructions (Signed)
Medication Instructions:  Your physician has recommended you make the following change in your medication:   START: Losartan 50 mg twice daily  *If you need a refill on your cardiac medications before your next appointment, please call your pharmacy*   Lab Work: Your physician recommends that you return for lab work in:   Labs in 2 weeks: BMP  If you have labs (blood work) drawn today and your tests are completely normal, you will receive your results only by: [place] (if you have [place]) OR A paper copy in the mail If you have any lab test that is abnormal or we need to change your treatment, we will call you to review the results.   Testing/Procedures: None   Follow-Up: At [facility], you and your health needs are our priority.  As part of our continuing mission to provide you with exceptional heart care, we have created designated Provider Care Teams.  These Care Teams include your primary Cardiologist (physician) and Advanced Practice Providers (APPs -  Physician Assistants and Nurse Practitioners) who all work together to provide you with the care you need, when you need it.  We recommend signing up for the patient portal called "MyChart".  Sign up information is provided on this After Visit Summary.  MyChart is used to connect with patients for Virtual Visits (Telemedicine).  Patients are able to view lab/test results, encounter notes, upcoming appointments, etc.  Non-urgent messages can be sent to your provider as well.   To learn more about what you can do with MyChart, go to [email or web address].    Your next appointment:   6 month(s)  The format for your next appointment:   In Person  Provider:   [name], MD    Other Instructions None

## 2021-06-19 NOTE — Progress Notes (Signed)
Cardiology Office Note:    Date:  06/19/2021   ID:  Renee Flores, DOB 02-25-1966, MRN [id number]  PCP:  [name], MD  Cardiologist:  [name], MD    Referring MD: [name], MD   No chief complaint on file. Doing well  History of Present Illness:    Renee Flores is a 56 y.o. female with past medical history significant for essential hypertension, dyslipidemia, try trivial mitral regurg [place], palpitations, atypical chest pain.  Recently she has not ankle surgery done.  It was done in December since that time she noticed some elevation of the blood.  Interestingly she also noticed that her losartan usually given 50 mg twice daily is given only once a day.  Denies have any chest pain tightness squeezing pressure burning chest.  Past Medical History:  Diagnosis Date   Allergy    Arthritis    Bulging lumbar disc    G3-8   Complication of anesthesia    anxiety attack after waking up from anesthesia   Factor 5 Leiden mutation, heterozygous ([place])    Factor V deficiency, congenital ([place]) 03/23/2012   GERD (gastroesophageal reflux disease)    Heart murmur    Heart palpitations    Heart valve problem    mild- moderate mitral valve leakage    High cholesterol    HYPERCHOLESTEROLEMIA 02/27/2010   Qualifier: Diagnosis of  By: [name], Denise     Hypersomnia with sleep apnea, unspecified 11/03/2013   Hypertension    Lumbar radiculopathy 08/12/2018   MS (multiple sclerosis) (HCC)    questionable   Neuromuscular disorder (HCC)    OSA on CPAP 03/25/2017   Osteopenia 04/2016   T score -1.9   Osteoporosis    Preop cardiovascular exam 03/23/2012   Retrognathia 11/03/2013   Right knee DJD 07/08/2012   Severe obesity (BMI >= 40) ([place]) 11/03/2013   Shortness of breath 06/03/2013   Sleep apnea    Vitamin D deficiency 2014   17    Past Surgical History:  Procedure Laterality Date   ABDOMINAL HYSTERECTOMY     ANKLE FUSION Left 04/25/2021   APPENDECTOMY      arthroscopic knee surgery     CESAREAN SECTION     x2   chloecystectomy     CHOLECYSTECTOMY     ESOPHAGEAL DILATION  04/09/2012   KNEE LIGAMENT RECONSTRUCTION     laser throat polyps     Left ankle reconstructed     NECK LESION BIOPSY     PATELLA FRACTURE SURGERY     PELVIC LAPAROSCOPY     LSO   REPLACEMENT TOTAL KNEE Left 2012   REPLACEMENT TOTAL KNEE Right 2014   TOE SURGERY     TONSILLECTOMY     TONSILLECTOMY  1979   TOTAL ABDOMINAL HYSTERECTOMY W/ BILATERAL SALPINGOOPHORECTOMY  2000   TOTAL KNEE ARTHROPLASTY Left 2012   TOTAL KNEE ARTHROPLASTY Right 07/08/2012   Procedure: TOTAL KNEE ARTHROPLASTY with revision components ;  Surgeon: Hessie Dibble, MD;  Location: [facility];  Service: Orthopedics;  Laterality: Right;  PATIENT HAS HX. OF FACTOR IV DISORFER   TUBAL LIGATION      Current Medications: Current Meds  Medication Sig   albuterol (VENTOLIN HFA) 108 (90 Base) MCG/ACT inhaler Inhale 2 puffs into the lungs every 6 (six) hours as needed for wheezing or shortness of breath.   aspirin EC 81 MG tablet Take 81 mg by mouth daily.   azelastine (ASTELIN) 0.1 %  nasal spray Place 1 spray into both nostrils 2 (two) times daily. Use in each nostril as directed   Calcium Carb-Cholecalciferol (OYSTER SHELL CALCIUM W/D) 500-5 MG-MCG TABS TAKE 1 TABLET EVERY DAY   cloNIDine (CATAPRES) 0.1 MG tablet TAKE 1 TABLET THREE TIMES DAILY   fluticasone (FLONASE) 50 MCG/ACT nasal spray Place 2 sprays into both nostrils daily.   gabapentin (NEURONTIN) 600 MG tablet TAKE 1 TABLET AT BEDTIME   hydrochlorothiazide (HYDRODIURIL) 25 MG tablet Take 1 tablet (25 mg total) by mouth daily.   ketotifen (ZADITOR) 0.025 % ophthalmic solution Place 2 drops into both eyes daily as needed (for irritation).   losartan (COZAAR) 50 MG tablet TAKE 1 TABLET EVERY DAY   meloxicam (MOBIC) 15 MG tablet TAKE 1 TABLET EVERY DAY   metoprolol succinate (TOPROL-XL) 50 MG 24 hr tablet Take 1 tablet (50 mg total) by mouth  daily. (Patient taking differently: Take 50 mg by mouth 2 (two) times daily.)   montelukast (SINGULAIR) 10 MG tablet TAKE 1 TABLET AT BEDTIME   Multiple Vitamins-Minerals (ONE-A-DAY WOMENS 50+ ADVANTAGE PO) Take 1 tablet by mouth daily. Unknown strength   ofloxacin (FLOXIN OTIC) 0.3 % OTIC solution Place 5 drops into both ears daily.   omeprazole (PRILOSEC) 40 MG capsule TAKE 1 CAPSULE EVERY DAY   simvastatin (ZOCOR) 40 MG tablet TAKE 1 TABLET AT BEDTIME     Allergies:   Hydrochlorothiazide, Morphine, Erythromycin base, Erythromycin base, Vancomycin hcl, and Doxycycline   Social History   Socioeconomic History   Marital status: Widowed    Spouse name: [name]   Number of children: 2   Years of education: 12   Highest education level: Not on file  Occupational History    Employer: [employer]  Tobacco Use   Smoking status: Never    Passive exposure: Never   Smokeless tobacco: Never  Vaping Use   Vaping Use: Never used  Substance and Sexual Activity   Alcohol use: Not Currently    Alcohol/week: 0.0 standard drinks   Drug use: No   Sexual activity: Yes    Birth control/protection: Surgical    Comment: HYST-1st intercourse 56 yo-Fewer than 5 partners  Other Topics Concern   Not on file  Social History Narrative   [name]  and lives at home with her husband.   Patient has two children.   Patient works as a Mudlogger for [employer].   Patient drinks one cup of caffeine daily.   Patient is right-handed.   Patient has a high school education.   Social Determinants of Health   Financial Resource Strain: Low Risk    Difficulty of Paying Living Expenses: Not hard at all  Food Insecurity: No Food Insecurity   Worried About Charity fundraiser in the Last Year: Never true   [place] in the Last Year: Never true  Transportation Needs: No Transportation Needs   Lack of Transportation (Medical): No   Lack of Transportation (Non-Medical):  No  Physical Activity: Insufficiently Active   Days of Exercise per Week: 7 days   Minutes of Exercise per Session: 10 min  Stress: No Stress Concern Present   Feeling of Stress : Not at all  Social Connections: Moderately Integrated   Frequency of Communication with Friends and Family: More than three times a week   Frequency of Social Gatherings with Friends and Family: More than three times a week   Attends Religious Services: More than 4  times per year   Active Member of Clubs or Organizations: Yes   Attends Archivist Meetings: 1 to 4 times per year   Marital Status: Widowed     Family History: The patient's family history includes Breast cancer (age of onset: 44) in her paternal aunt; Breast cancer (age of onset: 45) in her paternal aunt; Cancer in her father; Colon cancer in her paternal aunt, paternal aunt, and paternal grandmother; Diabetes in her mother; Heart disease in her father and maternal grandfather; Heart failure in her maternal grandfather; Hyperlipidemia in her brother, father, and mother; Hypertension in her brother, brother, father, mother, and sister; Lymphoma in her maternal grandfather; Other in her father; Stroke in her paternal grandfather. ROS:   Please see the history of present illness.    All 14 point review of systems negative except as described per history of present illness  EKGs/Labs/Other Studies Reviewed:      Recent Labs: 03/14/2021: ALT 47; TSH 1.99 04/27/2021: BUN 7; Creatinine, Ser 0.64; Hemoglobin 11.5; Platelets 269; Potassium 3.3; Sodium 140  Recent Lipid Panel    Component Value Date/Time   CHOL 178 03/14/2021 0906   TRIG 134.0 03/14/2021 0906   HDL 66.20 03/14/2021 0906   CHOLHDL 3 03/14/2021 0906   VLDL 26.8 03/14/2021 0906   LDLCALC 85 03/14/2021 0906   LDLDIRECT 94.0 09/11/2020 1037    Physical Exam:    VS:  BP (!) 168/92 (BP Location: Right Arm)    Pulse 84    Ht 5\' 3"  (1.6 m)    Wt 265 lb (120.2 kg)    SpO2 97%     BMI 46.94 kg/m     Wt Readings from Last 3 Encounters:  06/19/21 265 lb (120.2 kg)  04/27/21 265 lb (120.2 kg)  03/23/21 268 lb (121.6 kg)     GEN:  Well nourished, well developed in no acute distress HEENT: Normal NECK: No JVD; No carotid bruits LYMPHATICS: No lymphadenopathy CARDIAC: RRR, no murmurs, no rubs, no gallops RESPIRATORY:  Clear to auscultation without rales, wheezing or rhonchi  ABDOMEN: Soft, non-tender, non-distended MUSCULOSKELETAL:  No edema; No deformity  SKIN: Warm and dry LOWER EXTREMITIES: no swelling NEUROLOGIC:  Alert and oriented x 3 PSYCHIATRIC:  Normal affect   ASSESSMENT:    1. Primary hypertension   2. OSA on CPAP   3. Atypical chest pain   4. Morbidly obese ([place])   5. Dependence on CPAP ventilation   6. Factor 5 Leiden mutation, heterozygous ([place])    PLAN:    In order of problems listed above:  Essential hypertension which seems to be uncontrolled.  I will increase dose of losartan to 50 twice daily rest of the medication will be the same Atypical chest pain: Stress test done in summer of last year was negative.  Continue present management. Dyspnea on exertion multifactorial but now hopefully she will be more mobile after surgery of her ankle and she will be able to get more conditioning. Dyslipidemia, I did review her K PN which showed LDL of 85 HDL 66 this is done in November of last year.  It is appropriate cholesterol for her risk factors.   Medication Adjustments/Labs and Tests Ordered: Current medicines are reviewed at length with the patient today.  Concerns regarding medicines are outlined above.  No orders of the defined types were placed in this encounter.  Medication changes: No orders of the defined types were placed in this encounter.   Signed, Park Liter, MD,  [facility] 06/19/2021 4:45 PM    [facility]

## 2021-06-24 ENCOUNTER — Other Ambulatory Visit: Payer: Self-pay | Admitting: Cardiology

## 2021-06-24 DIAGNOSIS — I270 Primary pulmonary hypertension: Secondary | ICD-10-CM

## 2021-07-03 DIAGNOSIS — M19072 Primary osteoarthritis, left ankle and foot: Secondary | ICD-10-CM | POA: Diagnosis not present

## 2021-07-03 DIAGNOSIS — Z9889 Other specified postprocedural states: Secondary | ICD-10-CM | POA: Diagnosis not present

## 2021-07-07 ENCOUNTER — Other Ambulatory Visit: Payer: Self-pay | Admitting: Family Medicine

## 2021-07-07 DIAGNOSIS — M5416 Radiculopathy, lumbar region: Secondary | ICD-10-CM

## 2021-07-08 ENCOUNTER — Telehealth: Payer: Self-pay | Admitting: Pharmacist

## 2021-07-08 NOTE — Progress Notes (Signed)
? ? ?Chronic Care Management ?Pharmacy Assistant  ? ?Name: Renee Flores  MRN: [id number] DOB: January 04, 1966 ? ?Renee Flores is an 56 y.o. year old female who presents for her initial CCM visit with the clinical pharmacist. ? ?Reason for Encounter: Chart Prep for Initial Visit with CPP ? ? ?Recent office visits:  ?04/02/21 Colin Benton, DO - Family Medicine - Influenza - promethazine-dextromethorphan (PROMETHAZINE-DM) 6.25-15 MG/5ML syrup prescribed. Follow up as needed.  ? ?03/01/21 Maximiano Coss, NP - Family Medicine - Acute left sided low back pain -  ?cyclobenzaprine (FLEXERIL) 5 MG tablet and diclofenac (VOLTAREN) 75 MG EC tablet prescribed. methylPREDNISolone acetate (DEPO-MEDROL) injection 80 mg administered in office. Follow up as needed. ? ?02/25/21 Maximiano Coss, NP - Recurrent acute otis media - amoxicillin-clavulanate (AUGMENTIN) 875-125 MG tablet, ofloxacin (FLOXIN OTIC) 0.3 % OTIC solution, and ondansetron (ZOFRAN) 4 MG tablet prescribed. Follow up as needed. ? ?01/29/21 [name] MD - Cardiology - Hypertension - metoprolol succinate (TOPROL-XL) 50 MG 24 hr tablet prescribed. (Increased from '25mg'$  to '50mg'$ ). Follow up as needed.  ? ? ?Recent consult visits:  ?06/19/21 [name], MD - Cardiology - Hypertension - Labs were ordered. losartan (COZAAR) 50 MG tablet 1 tablet twice daily prescribed. Follow up as scheduled.  ? ?06/11/21 Pietro Cassis, MD - ENT - Otalgia - Follow up in in 1 year.  ? ?04/25/21 [place] - Osteoarthritis - No notes available.  ? ?04/16/21 [place] or right ear - Continue medications and return to clinic in 6 weeks.  ? ?03/14/21 Annye Asa, MD - Family Medicine - Hypertension - Labs were ordered. Follow up in 6 months for Physical.  ? ?03/13/21 Teodoro Spray - Myopia left eye - No notes available.  ? ?03/13/21 Melony Overly - Osteoarthritis - No notes available.  ? ?03/01/21 Asencion Partridge Dohmeier - Neurology - Dependence on CPA  - CPAP ordered placed. Follow up in 12 months.  ? ?01/25/21 Melony Overly - Osteoarthritis - No notes available.  ? ? ?Hospital visits: 04/27/21 ?Medication Reconciliation was completed by comparing discharge summary, patient?s EMR and Pharmacy list, and upon discussion with patient. ? ?Admitted to the hospital on 04/27/21 due to Post Op Pain. Discharge date was 04/28/21. Discharged from [facility].   ? ?New?Medications Started at [facility] Discharge:?? ?None noted.  ? ?Medication Changes at Hospital Discharge: ?None noted.  ? ?Medications Discontinued at Hospital Discharge: ?None noted.  ? ?Medications that remain the same after Hospital Discharge:??  ?All other medications will remain the same.   ? ?Hospital visits: 03/23/21 ?Medication Reconciliation was completed by comparing discharge summary, patient?s EMR and Pharmacy list, and upon discussion with patient. ? ?Admitted to the hospital on 03/23/21 due to Acute cough. Discharge date was 03/23/21. Discharged from [facility] [place].   ? ?New?Medications Started at [facility] Discharge:?? ?albuterol (VENTOLIN HFA) 108 (90 Base) MCG/ACT inhaler ? ?Medication Changes at Hospital Discharge: ?None noted.  ? ?Medications Discontinued at Hospital Discharge: ?None noted.  ? ?Medications that remain the same after Hospital Discharge:??  ?All other medications will remain the same.   ? ?Medications: ?Outpatient Encounter Medications as of 07/08/2021  ?Medication Sig  ? albuterol (VENTOLIN HFA) 108 (90 Base) MCG/ACT inhaler Inhale 2 puffs into the lungs every 6 (six) hours as needed for wheezing or shortness of breath.  ? aspirin EC 81 MG tablet Take 81 mg by mouth daily.  ? azelastine (ASTELIN) 0.1 % nasal spray Place 1 spray into both nostrils 2 (two)  times daily. Use in each nostril as directed  ? Calcium Carb-Cholecalciferol (OYSTER SHELL CALCIUM W/D) 500-5 MG-MCG TABS TAKE 1 TABLET EVERY DAY  ? cloNIDine (CATAPRES) 0.1 MG tablet TAKE 1 TABLET THREE  TIMES DAILY  ? cyclobenzaprine (FLEXERIL) 5 MG tablet Take 1 tablet (5 mg total) by mouth 3 (three) times daily as needed for muscle spasms. (Patient not taking: Reported on 06/19/2021)  ? diclofenac (VOLTAREN) 75 MG EC tablet Take 1 tablet (75 mg total) by mouth 2 (two) times daily. (Patient not taking: Reported on 06/19/2021)  ? fluticasone (FLONASE) 50 MCG/ACT nasal spray Place 2 sprays into both nostrils daily.  ? gabapentin (NEURONTIN) 600 MG tablet TAKE 1 TABLET AT BEDTIME  ? hydrochlorothiazide (HYDRODIURIL) 25 MG tablet Take 1 tablet (25 mg total) by mouth daily.  ? ketotifen (ZADITOR) 0.025 % ophthalmic solution Place 2 drops into both eyes daily as needed (for irritation).  ? losartan (COZAAR) 50 MG tablet Take 1 tablet (50 mg total) by mouth 2 (two) times daily.  ? meloxicam (MOBIC) 15 MG tablet TAKE 1 TABLET EVERY DAY  ? metoprolol succinate (TOPROL-XL) 50 MG 24 hr tablet Take 1 tablet (50 mg total) by mouth daily.  ? montelukast (SINGULAIR) 10 MG tablet TAKE 1 TABLET AT BEDTIME  ? Multiple Vitamins-Minerals (ONE-A-DAY WOMENS 50+ ADVANTAGE PO) Take 1 tablet by mouth daily. Unknown strength  ? ofloxacin (FLOXIN OTIC) 0.3 % OTIC solution Place 5 drops into both ears daily.  ? omeprazole (PRILOSEC) 40 MG capsule TAKE 1 CAPSULE EVERY DAY  ? ondansetron (ZOFRAN) 4 MG tablet Take 1 tablet (4 mg total) by mouth every 8 (eight) hours as needed for nausea or vomiting. (Patient not taking: Reported on 06/19/2021)  ? promethazine-dextromethorphan (PROMETHAZINE-DM) 6.25-15 MG/5ML syrup Take 5 mLs by mouth 3 (three) times daily as needed for cough. (Patient not taking: Reported on 06/19/2021)  ? simvastatin (ZOCOR) 40 MG tablet TAKE 1 TABLET AT BEDTIME  ? ?No facility-administered encounter medications on file as of 07/08/2021.  ? ? ?Have you seen any other providers since your last visit? no ? ?Any changes in your medications or health? Yes. Metoprolol was recently increased from 25 ?mg to 50 mg ? ?Any side effects from  any medications? no ? ?Do you have an symptoms or problems not managed by your medications? No ? ?Any concerns about your health right now? Yes patient has been having issues with a recurrent ear infection and allergies with the weather change lately.  ? ?Has your provider asked that you check blood pressure, blood sugar, or follow special diet at home? yes ? ?Do you get any type of exercise on a regular basis? Yes patient has been doing PT since her surgery. ? ?Can you think of a goal you would like to reach for your health? Patient reported she would like to work on weight loss has been in a walking boot after surgery for several months. ? ?Do you have any problems getting your medications? no ? ?Is there anything that you would like to discuss during the appointment? Patient would like to discuss her currently taking her previous rx cough medicine and maybe getting a refill for this at this appointment.  ? ?Please bring medications and supplements to appointment . Patient confirmed appointment date and time.  ? ? ?Care Gaps ? ?AWV: done 07/30/20 ?Colonoscopy: due 08/04/29 ?DM Eye Exam: N/A ?DM Foot Exam: N/A ?Microalbumin: N/A ?HbgAIC: done 09/12/20 (6.1) ?DEXA: done 05/12/16 ?Mammogram: done 11/30/13 ? ? ?Star Rating  Drugs: ?losartan (COZAAR) 50 MG tablet - last filled 06/20/21 90 days  ? ? ?Future Appointments  ?Date Time Provider [place]  ?07/10/2021  9:30 AM [facility] CCM PHARMACIST [facility]  ?09/10/2021 10:30 AM Ward Givens, NP [facility] None  ?09/12/2021  7:30 AM [name], MD [facility]  ?12/31/2021  9:20 AM Park Liter, MD [facility] None  ? ? ?Liza Showfety, CCMA ?Clinical Pharmacist Assistant  ?([phone] ? ?

## 2021-07-09 NOTE — Progress Notes (Signed)
? ?Chronic Care Management ?Pharmacy Note ? ?07/12/2021 ?Name:  Renee Flores MRN:  [id number] DOB:  [birth year] ? ?Summary: ?Initial visit with PharmD.  BP elevated at home, checking before meds.  Having allergy symptoms at home and lingering cough.  Also has not had DEXA since 2018. ? ?Recommendations/Changes made from today's visit: ?Updated DEXA ?Requesting rx for Promethazine-DM ?Monitor BP at home varying times of day ? ?Plan: ?FU 6 month PharmD ?BP check 1 month with CMA ? ? ?Subjective: ?Renee Flores is an 56 y.o. year old female who is a primary patient of Tabori, Aundra Millet, MD.  The CCM team was consulted for assistance with disease management and care coordination needs.   ? ?Engaged with patient by telephone for initial visit in response to provider referral for pharmacy case management and/or care coordination services.  ? ?Consent to Services:  ?The patient was given the following information about Chronic Care Management services today, agreed to services, and gave verbal consent: 1. CCM service includes personalized support from designated clinical staff supervised by the primary care provider, including individualized plan of care and coordination with other care providers 2. 24/7 contact phone numbers for assistance for urgent and routine care needs. 3. Service will only be billed when office clinical staff spend 20 minutes or more in a month to coordinate care. 4. Only one practitioner may furnish and bill the service in a calendar month. 5.The patient may stop CCM services at any time (effective at the end of the month) by phone call to the office staff. 6. The patient will be responsible for cost sharing (co-pay) of up to 20% of the service fee (after annual deductible is met). Patient agreed to services and consent obtained. ? ?Patient Care Team: ?[name], MD as PCP - General (Family Medicine) ?Park Liter, MD as PCP - Cardiology (Cardiology) ?Dohmeier, Asencion Partridge, MD as  Consulting Physician (Neurology) ?Wallene Huh, DPM as Consulting Physician (Podiatry) ?Erle Crocker, MD as Consulting Physician (Orthopedic Surgery) ?Edythe Clarity, [facility] as Pharmacist (Pharmacist) ? ?Recent office visits:  ?04/02/21 Colin Benton, DO - Family Medicine - Influenza - promethazine-dextromethorphan (PROMETHAZINE-DM) 6.25-15 MG/5ML syrup prescribed. Follow up as needed.  ?  ?03/01/21 Maximiano Coss, NP - Family Medicine - Acute left sided low back pain -  ?cyclobenzaprine (FLEXERIL) 5 MG tablet and diclofenac (VOLTAREN) 75 MG EC tablet prescribed. methylPREDNISolone acetate (DEPO-MEDROL) injection 80 mg administered in office. Follow up as needed. ?  ?02/25/21 Maximiano Coss, NP - Recurrent acute otis media - amoxicillin-clavulanate (AUGMENTIN) 875-125 MG tablet, ofloxacin (FLOXIN OTIC) 0.3 % OTIC solution, and ondansetron (ZOFRAN) 4 MG tablet prescribed. Follow up as needed. ?  ?01/29/21 [name] MD - Cardiology - Hypertension - metoprolol succinate (TOPROL-XL) 50 MG 24 hr tablet prescribed. (Increased from 62m to 556m. Follow up as needed.  ?  ?  ?Recent consult visits:  ?06/19/21 RoJenne CampusMD - Cardiology - Hypertension - Labs were ordered. losartan (COZAAR) 50 MG tablet 1 tablet twice daily prescribed. Follow up as scheduled.  ?  ?06/11/21 DaPietro CassisMD - ENT - Otalgia - Follow up in in 1 year.  ?  ?[place] Oste[place] - No notes available.  ?  ?04/16/21 DaAnnetta Northr right ear - Continue medications and return to clinic in 6 weeks.  ?  ?03/14/21 KaAnnye AsaMD - Family Medicine - Hypertension - Labs were ordered. Follow up in 6 months for Physical.  ?  ?  03/13/21 Teodoro Spray - Myopia left eye - No notes available.  ?  ?03/13/21 Melony Overly - Osteoarthritis - No notes available.  ?  ?03/01/21 Asencion Partridge Dohmeier - Neurology - Dependence on CPA - CPAP ordered placed. Follow up in 12 months.  ?  ?01/25/21  Melony Overly - Osteoarthritis - No notes available.  ?  ?  ?Hospital visits: 04/27/21 ?Medication Reconciliation was completed by comparing discharge summary, patient?s EMR and Pharmacy list, and upon discussion with patient. ?  ?Admitted to the hospital on 04/27/21 due to Post Op Pain. Discharge date was 04/28/21. Discharged from [facility].   ?  ?New?Medications Started at [facility] Discharge:?? ?None noted.  ?  ?Medication Changes at Hospital Discharge: ?None noted.  ?  ?Medications Discontinued at Hospital Discharge: ?None noted.  ?  ?Medications that remain the same after Hospital Discharge:??  ?All other medications will remain the same.   ?  ?Hospital visits: 03/23/21 ?Medication Reconciliation was completed by comparing discharge summary, patient?s EMR and Pharmacy list, and upon discussion with patient. ?  ?Admitted to the hospital on 03/23/21 due to Acute cough. Discharge date was 03/23/21. Discharged from [facility] [place].   ?  ?New?Medications Started at [facility] Discharge:?? ?albuterol (VENTOLIN HFA) 108 (90 Base) MCG/ACT inhaler ?  ?Medication Changes at Hospital Discharge: ?None noted.  ?  ?Medications Discontinued at Hospital Discharge: ?None noted.  ?  ?Medications that remain the same after Hospital Discharge:??  ?All other medications will remain the same.   ? ? ?Objective: ? ?Lab Results  ?Component Value Date  ? CREATININE 0.64 04/27/2021  ? BUN 7 04/27/2021  ? GFR 96.45 03/14/2021  ? GFRNONAA >60 04/27/2021  ? GFRAA >90 06/01/2013  ? NA 140 04/27/2021  ? K 3.3 (L) 04/27/2021  ? CALCIUM 9.4 04/27/2021  ? CO2 28 04/27/2021  ? GLUCOSE 112 (H) 04/27/2021  ? ? ?Lab Results  ?Component Value Date/Time  ? HGBA1C 6.1 09/12/2020 12:33 PM  ? GFR 96.45 03/14/2021 09:06 AM  ? GFR 85.12 09/26/2020 03:57 PM  ?  ?Last diabetic Eye exam: No results found for: HMDIABEYEEXA  ?Last diabetic Foot exam: No results found for: HMDIABFOOTEX  ? ?Lab Results  ?Component Value Date  ? CHOL 178  03/14/2021  ? HDL 66.20 03/14/2021  ? [place] 85 03/14/2021  ? LDLDIRECT 94.0 09/11/2020  ? TRIG 134.0 03/14/2021  ? CHOLHDL 3 03/14/2021  ? ? ?Hepatic Function Latest Ref Rng & Units 03/14/2021 09/26/2020 09/11/2020  ?Total Protein 6.0 - 8.3 g/dL 7.5 6.9 7.5  ?Albumin 3.5 - 5.2 g/dL 4.6 4.2 4.6  ?AST 0 - 37 U/L 35 48(H) 65(H)  ?ALT 0 - 35 U/L 47(H) 56(H) 67(H)  ?Alk Phosphatase 39 - 117 U/L 99 86 100  ?Total Bilirubin 0.2 - 1.2 mg/dL 0.4 0.5 0.5  ?Bilirubin, Direct 0.0 - 0.3 mg/dL 0.1 0.1 0.1  ? ? ?Lab Results  ?Component Value Date/Time  ? TSH 1.99 03/14/2021 09:06 AM  ? TSH 1.57 09/11/2020 10:37 AM  ? ? ?CBC Latest Ref Rng & Units 04/27/2021 03/14/2021 09/11/2020  ?WBC 4.0 - 10.5 K/uL 10.2 7.1 7.0  ?Hemoglobin 12.0 - 15.0 g/dL 11.5(L) 12.9 12.6  ?Hematocrit 36.0 - 46.0 % 36.1 40.2 37.9  ?Platelets 150 - 400 K/uL 269 236.0 268.0  ? ? ?Lab Results  ?Component Value Date/Time  ? VD25OH 28.82 (L) 09/11/2020 10:37 AM  ? ? ?Clinical ASCVD: No  ?The 10-year ASCVD risk score (Arnett DK, et al., 2019) is: 5.8% ?  Values used to calculate the score: ?    Age: 45 years ?    Sex: Female ?    Is Non-Hispanic African American: No ?    Diabetic: Yes ?    Tobacco smoker: No ?    Systolic Blood Pressure: 161 mmHg ?    Is BP treated: Yes ?    HDL Cholesterol: 66.2 mg/dL ?    Total Cholesterol: 178 mg/dL   ? ?Depression screen [facility] 2/9 03/14/2021 02/25/2021 09/11/2020  ?Decreased Interest 0 0 0  ?Down, Depressed, Hopeless 0 0 0  ?PHQ - 2 Score 0 0 0  ?Altered sleeping 2 2 0  ?Tired, decreased energy 1 1 0  ?Change in appetite 0 0 0  ?Feeling bad or failure about yourself  0 0 0  ?Trouble concentrating 0 0 0  ?Moving slowly or fidgety/restless 0 0 0  ?Suicidal thoughts 0 0 0  ?PHQ-9 Score 3 3 0  ?Difficult doing work/chores Not difficult at all Not difficult at all Not difficult at all  ?  ? ?Social History  ? ?Tobacco Use  ?Smoking Status Never  ? Passive exposure: Never  ?Smokeless Tobacco Never  ? ?BP Readings from Last 3 Encounters:   ?06/19/21 (!) 168/92  ?04/28/21 (!) 168/88  ?03/23/21 (!) 161/87  ? ?Pulse Readings from Last 3 Encounters:  ?06/19/21 84  ?04/28/21 (!) 101  ?03/23/21 78  ? ?Wt Readings from Last 3 Encounters:  ?06/19/21 265 lb

## 2021-07-10 ENCOUNTER — Ambulatory Visit (INDEPENDENT_AMBULATORY_CARE_PROVIDER_SITE_OTHER): Payer: Medicare HMO | Admitting: Pharmacist

## 2021-07-10 ENCOUNTER — Other Ambulatory Visit: Payer: Self-pay | Admitting: Family Medicine

## 2021-07-10 DIAGNOSIS — E7800 Pure hypercholesterolemia, unspecified: Secondary | ICD-10-CM

## 2021-07-10 DIAGNOSIS — M8580 Other specified disorders of bone density and structure, unspecified site: Secondary | ICD-10-CM

## 2021-07-10 DIAGNOSIS — T7840XA Allergy, unspecified, initial encounter: Secondary | ICD-10-CM

## 2021-07-10 DIAGNOSIS — I10 Essential (primary) hypertension: Secondary | ICD-10-CM

## 2021-07-10 DIAGNOSIS — J301 Allergic rhinitis due to pollen: Secondary | ICD-10-CM

## 2021-07-11 DIAGNOSIS — M19072 Primary osteoarthritis, left ankle and foot: Secondary | ICD-10-CM | POA: Diagnosis not present

## 2021-07-11 DIAGNOSIS — Z9889 Other specified postprocedural states: Secondary | ICD-10-CM | POA: Diagnosis not present

## 2021-07-11 DIAGNOSIS — R2689 Other abnormalities of gait and mobility: Secondary | ICD-10-CM | POA: Diagnosis not present

## 2021-07-11 DIAGNOSIS — M6281 Muscle weakness (generalized): Secondary | ICD-10-CM | POA: Diagnosis not present

## 2021-07-12 MED ORDER — PROMETHAZINE-DM 6.25-15 MG/5ML PO SYRP: 5.0000 mL | ORAL_SOLUTION | Freq: Three times a day (TID) | ORAL | 0 refills | Status: DC | PRN

## 2021-07-12 NOTE — Addendum Note (Signed)
Addended by: Edythe Clarity on: 07/12/2021 10:55 AM ? ? Modules accepted: Orders ? ?

## 2021-07-12 NOTE — Patient Instructions (Addendum)
Visit Information ? ? Goals Addressed   ? ?  ?  ?  ?  ? This Visit's Progress  ?  Track and Manage My Blood Pressure-Hypertension     ?  Timeframe:  Long-Range Goal ?Priority:  High ?Start Date: 07/12/21                            ?Expected End Date:  01/12/22                    ? ?Follow Up Date 10/12/21  ?  ?- check blood pressure weekly ?- choose a place to take my blood pressure (home, clinic or office, retail store) ?- write blood pressure results in a log or diary  ?  ?Why is this important?   ?You won't feel high blood pressure, but it can still hurt your blood vessels.  ?High blood pressure can cause heart or kidney problems. It can also cause a stroke.  ?Making lifestyle changes like losing a little weight or eating less salt will help.  ?Checking your blood pressure at home and at different times of the day can help to control blood pressure.  ?If the doctor prescribes medicine remember to take it the way the doctor ordered.  ?Call the office if you cannot afford the medicine or if there are questions about it.   ?  ?Notes:  ?  ? ?  ? ?Patient Care Plan: General Pharmacy (Adult)  ?  ? ?Problem Identified: HTN, GERD, Osteopenia, HLD, Factor V Leiden   ?Priority: High  ?Onset Date: 07/12/2021  ?  ? ?Long-Range Goal: Patient-Specific Goal   ?Start Date: 07/12/2021  ?Expected End Date: 01/12/2022  ?This Visit's Progress: On track  ?Priority: High  ?Note:   ?Current Barriers:  ?Elevated BP at home ? ?Pharmacist Clinical Goal(s):  ?Patient will achieve control of BP as evidenced by monitoring through collaboration with PharmD and provider.  ? ?Interventions: ?1:1 collaboration with [name], MD regarding development and update of comprehensive plan of care as evidenced by provider attestation and co-signature ?Inter-disciplinary care team collaboration (see longitudinal plan of care) ?Comprehensive medication review performed; medication list updated in electronic medical record ? ?Hypertension (BP goal  <140/90) ?-Uncontrolled ?-Current treatment: ?Clonidone 0.'1mg'$  three times per day Appropriate, Effective, Safe, Accessible ?Losartan '50mg'$  twice daily Appropriate, Effective, Safe, Accessible ?Metoprolol XL  '50mg'$  daily Appropriate, Effective, Safe, Accessible ?-Medications previously tried: HCTZ  ?-Current home readings: 154/93 P 82 (without medication),  ?-Denies hypotensive/hypertensive symptoms ?-Educated on BP goals and benefits of medications for prevention of heart attack, stroke and kidney damage; ?Daily salt intake goal < 2300 mg; ?Exercise goal of 150 minutes per week; ?Importance of home blood pressure monitoring; ?-Counseled to monitor BP at home a few times per week before and after medications, document, and provide log at future appointments ?-Recommended to continue current medication ?CMA BP check next month - can adjust meds at that time if needed ? ?Hyperlipidemia: (LDL goal < 100) ?-Controlled ?-Current treatment: ?Simvastatin '40mg'$  Appropriate, Effective, Safe, Accessible ?-Medications previously tried: none noted  ?-Educated on Cholesterol goals;  ?Benefits of statin for ASCVD risk reduction; ?Importance of limiting foods high in cholesterol; ?-Recommended to continue current medication ?LDL controlled at this time - no adverse effects with medication ? ? Osteopenia (Goal Maintain bone density, prevent fractures/falls) ?-Controlled ?-Last DEXA Scan: 2018  ? T-Score total hip: -1.9 ? T-Score lumbar spine: 0.4 ?-Patient is not  a candidate for pharmacologic treatment ?-Current treatment  ?Oyster [place] Calcium + Vitamin D Appropriate, Effective, Safe, Accessible ?-Medications previously tried: none noted ?-Recommend [phone] units of vitamin D daily. Recommend 1200 mg of calcium daily from dietary and supplemental sources. Recommend weight-bearing and muscle strengthening exercises for building and maintaining bone density. Will need repeat DEXA scan. ?-Recommended to continue current  medication ?Recommended repeat DEXA scan to determine if bone density has progressed or not. ? ?Allergies (Goal: Minimize symptoms) ?-Controlled ?-Current treatment  ?Montelukast '10mg'$  hs Appropriate, Effective, Safe, Accessible ?Flonase 65mg prn Appropriate, Effective, Safe, Accessible ?-Medications previously tried: none noted ?-Having some allergies as the seasons change.  Reports Singulair as effective for symptoms.  ?She requests a refill on her Promethazine-DM syrup for a lingering cough she is having. ?-Recommended to continue current medication ?Will consult with PCP on appropriateness of Promethazine-DM refill. ? ?Patient Goals/Self-Care Activities ?Patient will:  ?- take medications as prescribed as evidenced by patient report and record review ?check blood pressure a few times per week, document, and provide at future appointments ? ?Follow Up Plan: The care management team will reach out to the patient again over the next 180 days.  ? ?  ? ? ?Ms. Bashore was given information about Chronic Care Management services today including:  ?CCM service includes personalized support from designated clinical staff supervised by her physician, including individualized plan of care and coordination with other care providers ?24/7 contact phone numbers for assistance for urgent and routine care needs. ?Standard insurance, coinsurance, copays and deductibles apply for chronic care management only during months in which we provide at least 20 minutes of these services. Most insurances cover these services at 100%, however patients may be responsible for any copay, coinsurance and/or deductible if applicable. This service may help you avoid the need for more expensive face-to-face services. ?Only one practitioner may furnish and bill the service in a calendar month. ?The patient may stop CCM services at any time (effective at the end of the month) by phone call to the office staff. ? ?Patient agreed to services and  verbal consent obtained.  ? ?The patient verbalized understanding of instructions, educational materials, and care plan provided today and agreed to receive a mailed copy of patient instructions, educational materials, and care plan.  ?Telephone follow up appointment with pharmacy team member scheduled for: 6 months ? ?CEdythe Clarity [facility] ?CBeverly Milch PharmD ?Clinical Pharmacist  ?[place]?([phone]? ?

## 2021-07-15 ENCOUNTER — Other Ambulatory Visit: Payer: Self-pay | Admitting: Family Medicine

## 2021-07-15 ENCOUNTER — Encounter: Payer: Self-pay | Admitting: Registered Nurse

## 2021-07-15 ENCOUNTER — Ambulatory Visit (INDEPENDENT_AMBULATORY_CARE_PROVIDER_SITE_OTHER): Payer: Medicare HMO | Admitting: Registered Nurse

## 2021-07-15 VITALS — BP 164/91 | HR 74 | Temp 98.0°F | Resp 18 | Ht 63.0 in | Wt 268.0 lb

## 2021-07-15 DIAGNOSIS — J22 Unspecified acute lower respiratory infection: Secondary | ICD-10-CM

## 2021-07-15 DIAGNOSIS — M6281 Muscle weakness (generalized): Secondary | ICD-10-CM | POA: Diagnosis not present

## 2021-07-15 DIAGNOSIS — M19072 Primary osteoarthritis, left ankle and foot: Secondary | ICD-10-CM | POA: Diagnosis not present

## 2021-07-15 DIAGNOSIS — R2689 Other abnormalities of gait and mobility: Secondary | ICD-10-CM | POA: Diagnosis not present

## 2021-07-15 DIAGNOSIS — R52 Pain, unspecified: Secondary | ICD-10-CM

## 2021-07-15 DIAGNOSIS — Z9889 Other specified postprocedural states: Secondary | ICD-10-CM | POA: Diagnosis not present

## 2021-07-15 MED ORDER — PREDNISONE 20 MG PO TABS: 20.0000 mg | ORAL_TABLET | Freq: Every day | ORAL | 0 refills | Status: DC

## 2021-07-15 MED ORDER — AZITHROMYCIN 250 MG PO TABS: ORAL_TABLET | ORAL | 0 refills | Status: AC

## 2021-07-15 MED ORDER — FLUTICASONE-SALMETEROL 250-50 MCG/ACT IN AEPB: 1.0000 | INHALATION_SPRAY | Freq: Two times a day (BID) | RESPIRATORY_TRACT | 2 refills | Status: DC

## 2021-07-15 NOTE — Progress Notes (Signed)
? ?Established Patient Office Visit ? ?Subjective:  ?Patient ID: Renee Flores, female    DOB: December 01, 1965  Age: 56 y.o. MRN: [id number] ? ?CC:  ?Chief Complaint  ?Patient presents with  ? Cough  ?  Patient states she is having a cough and congestion. Patient states she has been coughing up yellow phlegm. Per patient she has been using inhaler , and cough syrup with no relief  ? ? ?HPI ?Renee Flores presents for cough ? ?Onset over 1 week ago ?Has had allergies ongoing for a few weeks ?Now developing into chest congestion with productive cough. ?Has been using albuterol inhaler and promethazine syrup, some relief, but incomplete.  ? ?Denies nvd, fevers, chills, fatigue, sick contacts.  ? ?Had used advair in the past, good effect  ? ?Past Medical History:  ?Diagnosis Date  ? Allergy   ? Arthritis   ? Bulging lumbar disc   ? L4-5  ? Complication of anesthesia   ? anxiety attack after waking up from anesthesia  ? Factor 5 Leiden mutation, heterozygous ([place])   ? Factor V deficiency, congenital ([place]) 03/23/2012  ? GERD (gastroesophageal reflux disease)   ? Heart murmur   ? Heart palpitations   ? Heart valve problem   ? mild- moderate mitral valve leakage   ? High cholesterol   ? HYPERCHOLESTEROLEMIA 02/27/2010  ? Qualifier: Diagnosis of  By: Gustavo Lah    ? Hypersomnia with sleep apnea, unspecified 11/03/2013  ? Hypertension   ? Lumbar radiculopathy 08/12/2018  ? MS (multiple sclerosis) ([place])   ? questionable  ? Neuromuscular disorder ([place])   ? OSA on CPAP 03/25/2017  ? Osteopenia 04/2016  ? T score -1.9  ? Osteoporosis   ? Preop cardiovascular exam 03/23/2012  ? Retrognathia 11/03/2013  ? Right knee DJD 07/08/2012  ? Severe obesity (BMI >= 40) ([place]) 11/03/2013  ? Shortness of breath 06/03/2013  ? Sleep apnea   ? Vitamin D deficiency 2014  ? 17  ? ? ?Past Surgical History:  ?Procedure Laterality Date  ? ABDOMINAL HYSTERECTOMY    ? ANKLE FUSION Left 04/25/2021  ? APPENDECTOMY    ? arthroscopic knee surgery    ?  CESAREAN SECTION    ? x2  ? chloecystectomy    ? CHOLECYSTECTOMY    ? ESOPHAGEAL DILATION  04/09/2012  ? KNEE LIGAMENT RECONSTRUCTION    ? laser throat polyps    ? Left ankle reconstructed    ? NECK LESION BIOPSY    ? PATELLA FRACTURE SURGERY    ? PELVIC LAPAROSCOPY    ? LSO  ? REPLACEMENT TOTAL KNEE Left 2012  ? REPLACEMENT TOTAL KNEE Right 2014  ? TOE SURGERY    ? TONSILLECTOMY    ? TONSILLECTOMY  1979  ? TOTAL ABDOMINAL HYSTERECTOMY W/ BILATERAL SALPINGOOPHORECTOMY  2000  ? TOTAL KNEE ARTHROPLASTY Left 2012  ? TOTAL KNEE ARTHROPLASTY Right 07/08/2012  ? Procedure: TOTAL KNEE ARTHROPLASTY with revision components ;  Surgeon: Hessie Dibble, MD;  Location: [place];  Service: Orthopedics;  Laterality: Right;  PATIENT HAS HX. OF FACTOR IV DISORFER  ? TUBAL LIGATION    ? ? ?Family History  ?Problem Relation Age of Onset  ? Heart disease Father   ? Hypertension Father   ? Hyperlipidemia Father   ? Cancer Father   ? Other Father   ?     Respiratory problems/Factor 5 disorder/1 bypass, 2 valve replacement (mitral and aortic)   ? Hypertension Mother   ?  Diabetes Mother   ? Hyperlipidemia Mother   ? Hypertension Sister   ? Colon cancer Paternal Aunt   ? Breast cancer Paternal Aunt 61  ? Heart disease Maternal Grandfather   ? Lymphoma Maternal Grandfather   ? Heart failure Maternal Grandfather   ? Colon cancer Paternal Grandmother   ? Hyperlipidemia Brother   ? Hypertension Brother   ? Hypertension Brother   ? Colon cancer Paternal Aunt   ? Breast cancer Paternal Aunt 40  ? Stroke Paternal Grandfather   ? ? ?Social History  ? ?Socioeconomic History  ? Marital status: Widowed  ?  Spouse name: Jenny Reichmann  ? Number of children: 2  ? Years of education: 24  ? Highest education level: Not on file  ?Occupational History  ?  Employer: [employer]  ?Tobacco Use  ? Smoking status: Never  ?  Passive exposure: Never  ? Smokeless tobacco: Never  ?Vaping Use  ? Vaping Use: Never used  ?Substance and Sexual Activity  ? Alcohol use:  Not Currently  ?  Alcohol/week: 0.0 standard drinks  ? Drug use: No  ? Sexual activity: Yes  ?  Birth control/protection: Surgical  ?  Comment: HYST-1st intercourse 56 yo-Fewer than 5 partners  ?Other Topics Concern  ? Not on file  ?Social History Narrative  ? [name]  and lives at home with her husband.  ? Patient has two children.  ? Patient works as a Mudlogger for [employer].  ? Patient drinks one cup of caffeine daily.  ? Patient is right-handed.  ? Patient has a high school education.  ? ?Social Determinants of Health  ? ?Financial Resource Strain: Low Risk   ? Difficulty of Paying Living Expenses: Not hard at all  ?Food Insecurity: No Food Insecurity  ? Worried About Charity fundraiser in the Last Year: Never true  ? Ran Out of Food in the Last Year: Never true  ?Transportation Needs: No Transportation Needs  ? Lack of Transportation (Medical): No  ? Lack of Transportation (Non-Medical): No  ?Physical Activity: Insufficiently Active  ? Days of Exercise per Week: 7 days  ? Minutes of Exercise per Session: 10 min  ?Stress: No Stress Concern Present  ? Feeling of Stress : Not at all  ?Social Connections: Moderately Integrated  ? Frequency of Communication with Friends and Family: More than three times a week  ? Frequency of Social Gatherings with Friends and Family: More than three times a week  ? Attends Religious Services: More than 4 times per year  ? Active Member of Clubs or Organizations: Yes  ? Attends Archivist Meetings: 1 to 4 times per year  ? Marital Status: Widowed  ?Intimate Partner Violence: Not At Risk  ? Fear of Current or Ex-Partner: No  ? Emotionally Abused: No  ? Physically Abused: No  ? Sexually Abused: No  ? ? ?Outpatient Medications Prior to Visit  ?Medication Sig Dispense Refill  ? albuterol (VENTOLIN HFA) 108 (90 Base) MCG/ACT inhaler Inhale 2 puffs into the lungs every 6 (six) hours as needed for wheezing or shortness of breath. 8 g 2  ? aspirin  EC 81 MG tablet Take 81 mg by mouth daily.    ? azelastine (ASTELIN) 0.1 % nasal spray Place 1 spray into both nostrils 2 (two) times daily. Use in each nostril as directed 30 mL 12  ? Calcium Carb-Cholecalciferol (OYSTER SHELL CALCIUM W/D) 500-5 MG-MCG TABS TAKE 1 TABLET  EVERY DAY 90 tablet 0  ? cloNIDine (CATAPRES) 0.1 MG tablet TAKE 1 TABLET THREE TIMES DAILY 270 tablet 0  ? fluticasone (FLONASE) 50 MCG/ACT nasal spray Place 2 sprays into both nostrils daily. 16 g 6  ? gabapentin (NEURONTIN) 600 MG tablet TAKE 1 TABLET AT BEDTIME 30 tablet 0  ? ketotifen (ZADITOR) 0.025 % ophthalmic solution Place 2 drops into both eyes daily as needed (for irritation). 5 mL 0  ? losartan (COZAAR) 50 MG tablet Take 1 tablet (50 mg total) by mouth 2 (two) times daily. 180 tablet 3  ? meloxicam (MOBIC) 15 MG tablet TAKE 1 TABLET EVERY DAY 90 tablet 0  ? metoprolol succinate (TOPROL-XL) 50 MG 24 hr tablet Take 1 tablet (50 mg total) by mouth daily. 90 tablet 3  ? montelukast (SINGULAIR) 10 MG tablet TAKE 1 TABLET AT BEDTIME 90 tablet 0  ? Multiple Vitamins-Minerals (ONE-A-DAY WOMENS 50+ ADVANTAGE PO) Take 1 tablet by mouth daily. Unknown strength    ? ofloxacin (FLOXIN OTIC) 0.3 % OTIC solution Place 5 drops into both ears daily. 5 mL 0  ? omeprazole (PRILOSEC) 40 MG capsule TAKE 1 CAPSULE EVERY DAY 90 capsule 0  ? ondansetron (ZOFRAN) 4 MG tablet Take 1 tablet (4 mg total) by mouth every 8 (eight) hours as needed for nausea or vomiting. 20 tablet 0  ? promethazine-dextromethorphan (PROMETHAZINE-DM) 6.25-15 MG/5ML syrup Take 5 mLs by mouth 3 (three) times daily as needed for cough. 118 mL 0  ? simvastatin (ZOCOR) 40 MG tablet TAKE 1 TABLET AT BEDTIME 90 tablet 0  ? cyclobenzaprine (FLEXERIL) 5 MG tablet Take 1 tablet (5 mg total) by mouth 3 (three) times daily as needed for muscle spasms. (Patient not taking: Reported on 07/10/2021) 30 tablet 1  ? diclofenac (VOLTAREN) 75 MG EC tablet Take 1 tablet (75 mg total) by mouth 2 (two)  times daily. (Patient not taking: Reported on 06/19/2021) 30 tablet 0  ? hydrochlorothiazide (HYDRODIURIL) 25 MG tablet Take 1 tablet (25 mg total) by mouth daily. (Patient not taking: Reported on 07/10/2021) 90 tabl

## 2021-07-15 NOTE — Patient Instructions (Addendum)
Ms. Haydu- ? ?Always a pleasure.  ? ?Advair twice daily. ? ?Z pack and prednisone.  ? ?Call if worsening or failing to improve ? ?Thank you ? ?Rich  ? ? ? ?If you have lab work done today you will be contacted with your lab results within the next 2 weeks.  If you have not heard from [place] then please contact us. The fastest way to get your results is to register for My Chart. ? ? ?IF you received an x-ray today, you will receive an invoice from [facility]. Please contact [facility] at [phone] with questions or concerns regarding your invoice.  ? ?IF you received labwork today, you will receive an invoice from [place]. Please contact [facility] at [phone] with questions or concerns regarding your invoice.  ? ?Our billing staff will not be able to assist you with questions regarding bills from these companies. ? ?You will be contacted with the lab results as soon as they are available. The fastest way to get your results is to activate your My Chart account. Instructions are located on the last page of this paperwork. If you have not heard from [place] regarding the results in 2 weeks, please contact this office. ?  ? ? ?

## 2021-07-26 DIAGNOSIS — E7800 Pure hypercholesterolemia, unspecified: Secondary | ICD-10-CM

## 2021-07-26 DIAGNOSIS — I10 Essential (primary) hypertension: Secondary | ICD-10-CM

## 2021-07-29 DIAGNOSIS — R2689 Other abnormalities of gait and mobility: Secondary | ICD-10-CM | POA: Diagnosis not present

## 2021-07-29 DIAGNOSIS — M19072 Primary osteoarthritis, left ankle and foot: Secondary | ICD-10-CM | POA: Diagnosis not present

## 2021-07-29 DIAGNOSIS — Z9889 Other specified postprocedural states: Secondary | ICD-10-CM | POA: Diagnosis not present

## 2021-07-29 DIAGNOSIS — M6281 Muscle weakness (generalized): Secondary | ICD-10-CM | POA: Diagnosis not present

## 2021-07-30 ENCOUNTER — Telehealth: Payer: Self-pay | Admitting: Pharmacist

## 2021-07-30 NOTE — Progress Notes (Signed)
? ? ?Chronic Care Management ?Pharmacy Assistant  ? ?Name: Renee Flores  MRN: [id number] DOB: 14-May-1965 ? ? ?Reason for Encounter: Disease State - Hypertension Call  ?  ? ?Recent office visits:  ?07/15/21 Maximiano Coss, NP - Lower respiratory infection - azithromycin (ZITHROMAX) 250 MG tablet,  ?fluticasone-salmeterol (ADVAIR DISKUS) 250-50 MCG/ACT AEPB, and predniSONE (DELTASONE) 20 MG tablet prescribed. Follow up if no improvement.  ? ?Recent consult visits:  ?None noted.  ? ?Hospital visits: 04/27/21 ?Medication Reconciliation was completed by comparing discharge summary, patient?s EMR and Pharmacy list, and upon discussion with patient. ?  ?Admitted to the hospital on 04/27/21 due to Post Op Pain. Discharge date was 04/28/21. Discharged from [facility].   ?  ?New?Medications Started at [facility] Discharge:?? ?None noted.  ?  ?Medication Changes at Hospital Discharge: ?None noted.  ?  ?Medications Discontinued at Hospital Discharge: ?None noted.  ?  ?Medications that remain the same after Hospital Discharge:??  ?All other medications will remain the same.   ?  ?Hospital visits: 03/23/21 ?Medication Reconciliation was completed by comparing discharge summary, patient?s EMR and Pharmacy list, and upon discussion with patient. ?  ?Admitted to the hospital on 03/23/21 due to Acute cough. Discharge date was 03/23/21. Discharged from [facility] [place].   ?  ?New?Medications Started at [facility] Discharge:?? ?albuterol (VENTOLIN HFA) 108 (90 Base) MCG/ACT inhaler ?  ?Medication Changes at Hospital Discharge: ?None noted.  ?  ?Medications Discontinued at Hospital Discharge: ?None noted.  ?  ?Medications that remain the same after Hospital Discharge:??  ?All other medications will remain the same.   ? ?Medications: ?Outpatient Encounter Medications as of 07/30/2021  ?Medication Sig  ? albuterol (VENTOLIN HFA) 108 (90 Base) MCG/ACT inhaler Inhale 2 puffs into the lungs every 6 (six) hours as needed for  wheezing or shortness of breath.  ? aspirin EC 81 MG tablet Take 81 mg by mouth daily.  ? azelastine (ASTELIN) 0.1 % nasal spray Place 1 spray into both nostrils 2 (two) times daily. Use in each nostril as directed  ? Calcium Carb-Cholecalciferol (OYSTER SHELL CALCIUM W/D) 500-5 MG-MCG TABS TAKE 1 TABLET EVERY DAY  ? cloNIDine (CATAPRES) 0.1 MG tablet TAKE 1 TABLET THREE TIMES DAILY  ? cyclobenzaprine (FLEXERIL) 5 MG tablet Take 1 tablet (5 mg total) by mouth 3 (three) times daily as needed for muscle spasms. (Patient not taking: Reported on 07/10/2021)  ? diclofenac (VOLTAREN) 75 MG EC tablet Take 1 tablet (75 mg total) by mouth 2 (two) times daily. (Patient not taking: Reported on 06/19/2021)  ? fluticasone (FLONASE) 50 MCG/ACT nasal spray Place 2 sprays into both nostrils daily.  ? fluticasone-salmeterol (ADVAIR DISKUS) 250-50 MCG/ACT AEPB Inhale 1 puff into the lungs in the morning and at bedtime.  ? gabapentin (NEURONTIN) 600 MG tablet TAKE 1 TABLET AT BEDTIME  ? hydrochlorothiazide (HYDRODIURIL) 25 MG tablet Take 1 tablet (25 mg total) by mouth daily. (Patient not taking: Reported on 07/10/2021)  ? ketotifen (ZADITOR) 0.025 % ophthalmic solution Place 2 drops into both eyes daily as needed (for irritation).  ? losartan (COZAAR) 50 MG tablet Take 1 tablet (50 mg total) by mouth 2 (two) times daily.  ? meloxicam (MOBIC) 15 MG tablet TAKE 1 TABLET EVERY DAY  ? metoprolol succinate (TOPROL-XL) 50 MG 24 hr tablet Take 1 tablet (50 mg total) by mouth daily.  ? montelukast (SINGULAIR) 10 MG tablet TAKE 1 TABLET AT BEDTIME  ? Multiple Vitamins-Minerals (ONE-A-DAY WOMENS 50+ ADVANTAGE PO) Take 1 tablet by  mouth daily. Unknown strength  ? ofloxacin (FLOXIN OTIC) 0.3 % OTIC solution Place 5 drops into both ears daily.  ? omeprazole (PRILOSEC) 40 MG capsule TAKE 1 CAPSULE EVERY DAY  ? ondansetron (ZOFRAN) 4 MG tablet Take 1 tablet (4 mg total) by mouth every 8 (eight) hours as needed for nausea or vomiting.  ? predniSONE  (DELTASONE) 20 MG tablet Take 1 tablet (20 mg total) by mouth daily with breakfast.  ? promethazine-dextromethorphan (PROMETHAZINE-DM) 6.25-15 MG/5ML syrup Take 5 mLs by mouth 3 (three) times daily as needed for cough.  ? simvastatin (ZOCOR) 40 MG tablet TAKE 1 TABLET AT BEDTIME  ? ?No facility-administered encounter medications on file as of 07/30/2021.  ? ?Current antihypertensive regimen:  ?Clonidone 0.'1mg'$  three times per day  ?Losartan '50mg'$  twice daily  ?Metoprolol XL  '50mg'$  daily ? ?How often are you checking your Blood Pressure?  ?Patient reported she is checking her blood pressure daily.  ? ? ?Current home BP readings: 140/0 (2 days ago) feels this is still slightly elevated due to her foot and doing therapy on her foot ? ? ?What recent interventions/DTPs have been made by any provider to improve Blood Pressure control since last CPP Visit:  ?Patient reported her Losartan is now twice daily from 1 tablet daily.  ? ? ?Any recent hospitalizations or ED visits since last visit with CPP?  ?Patient did have hospital visits on 04/27/21 and 03/23/22. ? ? ?What diet changes have been made to improve Blood Pressure Control?  ?Patient reported she tries to limit her salt intake in her diet. She continue to work on this.  ? ? ?What exercise is being done to improve your Blood Pressure Control?  ?Patient reported she does therapy on her foot and is no longer on her crutches and she tries to go to the gym atleast 3 days a week.  ? ? ? ?Adherence Review: ?Is the patient currently on ACE/ARB medication? Yes ?Does the patient have >5 day gap between last estimated fill dates? No  ? ? ?Care Gaps ?  ?AWV: done 07/30/20 ?Colonoscopy: due 08/04/29 ?DM Eye Exam: N/A ?DM Foot Exam: N/A ?Microalbumin: N/A ?HbgAIC: done 09/12/20 (6.1) ?DEXA: done 05/12/16 ?Mammogram: done 11/30/13 ?  ?  ?Star Rating Drugs: ?Losartan (COZAAR) 50 MG tablet - last filled 06/20/21 90 days  ? ? ?Future Appointments  ?Date Time Provider [place]  ?09/10/2021  10:30 AM Ward Givens, NP [facility] None  ?09/12/2021  7:40 AM [name], MD [facility]  ?12/31/2021  9:20 AM Park Liter, MD [facility] None  ?01/08/2022  3:45 PM [facility] PHARMACIST [facility]  ? ? ?Liza Showfety, CCMA ?Clinical Pharmacist Assistant  ?[phone] ? ?

## 2021-08-05 DIAGNOSIS — R2689 Other abnormalities of gait and mobility: Secondary | ICD-10-CM | POA: Diagnosis not present

## 2021-08-05 DIAGNOSIS — M6281 Muscle weakness (generalized): Secondary | ICD-10-CM | POA: Diagnosis not present

## 2021-08-05 DIAGNOSIS — Z9889 Other specified postprocedural states: Secondary | ICD-10-CM | POA: Diagnosis not present

## 2021-08-05 DIAGNOSIS — M19072 Primary osteoarthritis, left ankle and foot: Secondary | ICD-10-CM | POA: Diagnosis not present

## 2021-08-11 ENCOUNTER — Other Ambulatory Visit: Payer: Self-pay | Admitting: Family Medicine

## 2021-08-11 DIAGNOSIS — E1169 Type 2 diabetes mellitus with other specified complication: Secondary | ICD-10-CM

## 2021-08-16 DIAGNOSIS — Z9889 Other specified postprocedural states: Secondary | ICD-10-CM | POA: Diagnosis not present

## 2021-08-16 DIAGNOSIS — R2689 Other abnormalities of gait and mobility: Secondary | ICD-10-CM | POA: Diagnosis not present

## 2021-08-16 DIAGNOSIS — M6281 Muscle weakness (generalized): Secondary | ICD-10-CM | POA: Diagnosis not present

## 2021-08-16 DIAGNOSIS — M19072 Primary osteoarthritis, left ankle and foot: Secondary | ICD-10-CM | POA: Diagnosis not present

## 2021-08-21 DIAGNOSIS — M19072 Primary osteoarthritis, left ankle and foot: Secondary | ICD-10-CM | POA: Diagnosis not present

## 2021-08-21 DIAGNOSIS — R2689 Other abnormalities of gait and mobility: Secondary | ICD-10-CM | POA: Diagnosis not present

## 2021-08-21 DIAGNOSIS — Z9889 Other specified postprocedural states: Secondary | ICD-10-CM | POA: Diagnosis not present

## 2021-08-21 DIAGNOSIS — M6281 Muscle weakness (generalized): Secondary | ICD-10-CM | POA: Diagnosis not present

## 2021-08-22 ENCOUNTER — Other Ambulatory Visit: Payer: Self-pay

## 2021-08-22 ENCOUNTER — Ambulatory Visit (INDEPENDENT_AMBULATORY_CARE_PROVIDER_SITE_OTHER): Payer: Medicare HMO | Admitting: Family Medicine

## 2021-08-22 ENCOUNTER — Encounter: Payer: Self-pay | Admitting: Family Medicine

## 2021-08-22 VITALS — BP 158/74 | HR 79 | Temp 97.8°F | Resp 18 | Ht 63.0 in | Wt 268.0 lb

## 2021-08-22 DIAGNOSIS — R062 Wheezing: Secondary | ICD-10-CM

## 2021-08-22 DIAGNOSIS — R0981 Nasal congestion: Secondary | ICD-10-CM | POA: Diagnosis not present

## 2021-08-22 DIAGNOSIS — J22 Unspecified acute lower respiratory infection: Secondary | ICD-10-CM | POA: Diagnosis not present

## 2021-08-22 MED ORDER — AMOXICILLIN-POT CLAVULANATE 875-125 MG PO TABS: 1.0000 | ORAL_TABLET | Freq: Two times a day (BID) | ORAL | 0 refills | Status: DC

## 2021-08-22 MED ORDER — PREDNISONE 20 MG PO TABS: 40.0000 mg | ORAL_TABLET | Freq: Every day | ORAL | 0 refills | Status: DC

## 2021-08-22 NOTE — Progress Notes (Signed)
? ?Subjective:  ?Patient ID: Renee Flores, female    DOB: [birth year]  Age: 56 y.o. MRN: [id number] ? ?CC:  ?Chief Complaint  ?Patient presents with  ? Allergies  ?  Patient states she is having trouble with her allergies. Patient states she has some yellow mucus coming up , congested and fells like head is about to pop off.patient has been taking otc medication  ? ? ?HPI ?[name] presents for  ? ?Allergies, congestion: ?Chest congestion past 8 days. [place]ve [place] yesterday. Sneezing, increased nasal congestion yesterday. Sore throat today with drainage. Some chronic ear pain, worse in R ear past week.  ?No fever.  ?Discolored phlegm.  ?No home covid testing.  ?Singulair at night.  ?Otc sinus allergy med in am-acetaminophen 325 mg, diphenhydramine hydrochloride 12.5 mg, phenylephrine hydrochloride 5 mg.  promethazine cough syrup last nt. ?Using flonase daily.  ?Advair too costly. 2 puffs albuterol BID past week, not needed typically prior to past week.  ?Azithromycin last month.  ? ? ?History ?Patient Active Problem List  ? Diagnosis Date Noted  ? Morbidly obese ([place]) 03/01/2021  ? Dependence on CPAP ventilation 03/01/2021  ? Atypical chest pain 09/19/2020  ? Sleep apnea   ? Palpitations   ? Osteoporosis   ? Neuromuscular disorder ([place])   ? MS (multiple sclerosis) ([place])   ? Heart valve problem   ? Heart murmur   ? GERD (gastroesophageal reflux disease)   ? Factor 5 Leiden mutation, heterozygous ([place])   ? Complication of anesthesia   ? Bulging lumbar disc   ? Arthritis   ? Allergy   ? Hypertension 04/13/2020  ? Lumbar radiculopathy 08/12/2018  ? OSA on CPAP 03/25/2017  ? Hypersomnia with sleep apnea, unspecified 11/03/2013  ? Severe obesity (BMI >= 40) ([place]) 11/03/2013  ? Retrognathia 11/03/2013  ? Shortness of breath 06/03/2013  ? Right knee DJD 07/08/2012  ?  Class: Chronic  ? Physical exam 03/23/2012  ? Preop cardiovascular exam 03/23/2012  ? Factor V deficiency, congenital ([place]) 03/23/2012  ?  Osteopenia   ? Vitamin D deficiency   ? HYPERCHOLESTEROLEMIA 02/27/2010  ? ?Past Medical History:  ?Diagnosis Date  ? Allergy   ? Arthritis   ? Bulging lumbar disc   ? L4-5  ? Complication of anesthesia   ? anxiety attack after waking up from anesthesia  ? Factor 5 Leiden mutation, heterozygous ([place])   ? Factor V deficiency, congenital ([place]) 03/23/2012  ? GERD (gastroesophageal reflux disease)   ? Heart murmur   ? Heart palpitations   ? Heart valve problem   ? mild- moderate mitral valve leakage   ? High cholesterol   ? HYPERCHOLESTEROLEMIA 02/27/2010  ? Qualifier: Diagnosis of  By: Gustavo Lah    ? Hypersomnia with sleep apnea, unspecified 11/03/2013  ? Hypertension   ? Lumbar radiculopathy 08/12/2018  ? MS (multiple sclerosis) ([place])   ? questionable  ? Neuromuscular disorder ([place])   ? OSA on CPAP 03/25/2017  ? Osteopenia 04/2016  ? T score -1.9  ? Osteoporosis   ? Preop cardiovascular exam 03/23/2012  ? Retrognathia 11/03/2013  ? Right knee DJD 07/08/2012  ? Severe obesity (BMI >= 40) ([place]) 11/03/2013  ? Shortness of breath 06/03/2013  ? Sleep apnea   ? Vitamin D deficiency 2014  ? 17  ? ?Past Surgical History:  ?Procedure Laterality Date  ? ABDOMINAL HYSTERECTOMY    ? ANKLE FUSION Left 04/25/2021  ? APPENDECTOMY    ? arthroscopic  knee surgery    ? CESAREAN SECTION    ? x2  ? chloecystectomy    ? CHOLECYSTECTOMY    ? ESOPHAGEAL DILATION  04/09/2012  ? KNEE LIGAMENT RECONSTRUCTION    ? laser throat polyps    ? Left ankle reconstructed    ? NECK LESION BIOPSY    ? PATELLA FRACTURE SURGERY    ? PELVIC LAPAROSCOPY    ? LSO  ? REPLACEMENT TOTAL KNEE Left 2012  ? REPLACEMENT TOTAL KNEE Right 2014  ? TOE SURGERY    ? TONSILLECTOMY    ? TONSILLECTOMY  1979  ? TOTAL ABDOMINAL HYSTERECTOMY W/ BILATERAL SALPINGOOPHORECTOMY  2000  ? TOTAL KNEE ARTHROPLASTY Left 2012  ? TOTAL KNEE ARTHROPLASTY Right 07/08/2012  ? Procedure: TOTAL KNEE ARTHROPLASTY with revision components ;  Surgeon: Hessie Dibble, MD;  Location: [facility];   Service: Orthopedics;  Laterality: Right;  PATIENT HAS HX. OF FACTOR IV DISORFER  ? TUBAL LIGATION    ? ?Allergies  ?Allergen Reactions  ? Hydrochlorothiazide   ?  Other reaction(s): Other (See Comments) ?Leg Cramps  ? Morphine Other (See Comments) and Rash  ?  Hallucinating ? ?Other reaction(s): Other (See Comments) ?Hallucinating ?Hallucinating  ? Erythromycin Base Hives  ? Erythromycin Base Hives  ? Vancomycin Hcl Other (See Comments)  ?  Ringing in ears  ? Doxycycline Nausea Only and Rash  ? ?Prior to Admission medications   ?Medication Sig Start Date End Date Taking? Authorizing Provider  ?albuterol (VENTOLIN HFA) 108 (90 Base) MCG/ACT inhaler Inhale 2 puffs into the lungs every 6 (six) hours as needed for wheezing or shortness of breath. 03/23/21  Yes Fransico Meadow, PA-C  ?aspirin EC 81 MG tablet Take 81 mg by mouth daily.   Yes [provider]  ?azelastine (ASTELIN) 0.1 % nasal spray Place 1 spray into both nostrils 2 (two) times daily. Use in each nostril as directed 09/28/20  Yes Maximiano Coss, NP  ?Calcium Carb-Cholecalciferol (OYSTER SHELL CALCIUM W/D) 500-5 MG-MCG TABS TAKE 1 TABLET EVERY DAY 03/25/21  Yes [name], MD  ?cloNIDine (CATAPRES) 0.1 MG tablet TAKE 1 TABLET THREE TIMES DAILY 03/25/21  Yes [name], MD  ?fluticasone (FLONASE) 50 MCG/ACT nasal spray Place 2 sprays into both nostrils daily. 08/06/20  Yes [name], MD  ?fluticasone-salmeterol (ADVAIR DISKUS) 250-50 MCG/ACT AEPB Inhale 1 puff into the lungs in the morning and at bedtime. 07/15/21  Yes Maximiano Coss, NP  ?gabapentin (NEURONTIN) 600 MG tablet TAKE 1 TABLET AT BEDTIME 07/16/21  Yes [name], MD  ?hydrochlorothiazide (HYDRODIURIL) 25 MG tablet Take 1 tablet (25 mg total) by mouth daily. 04/16/20  Yes [name], MD  ?ketotifen (ZADITOR) 0.025 % ophthalmic solution Place 2 drops into both eyes daily as needed (for irritation). 04/16/20  Yes [name], MD   ?losartan (COZAAR) 50 MG tablet Take 1 tablet (50 mg total) by mouth 2 (two) times daily. 06/19/21  Yes Park Liter, MD  ?meloxicam (MOBIC) 15 MG tablet TAKE 1 TABLET EVERY DAY 07/08/21  Yes [name], MD  ?metoprolol succinate (TOPROL-XL) 50 MG 24 hr tablet Take 1 tablet (50 mg total) by mouth daily. 06/24/21  Yes Park Liter, MD  ?montelukast (SINGULAIR) 10 MG tablet TAKE 1 TABLET AT BEDTIME 07/10/21  Yes [name], MD  ?Multiple Vitamins-Minerals (ONE-A-DAY WOMENS 50+ ADVANTAGE PO) Take 1 tablet by mouth daily. Unknown strength   Yes [provider]  ?ofloxacin (FLOXIN  OTIC) 0.3 % OTIC solution Place 5 drops into both ears daily. 02/25/21  Yes Maximiano Coss, NP  ?omeprazole (PRILOSEC) 40 MG capsule TAKE 1 CAPSULE EVERY DAY 06/05/21  Yes [name], MD  ?ondansetron (ZOFRAN) 4 MG tablet Take 1 tablet (4 mg total) by mouth every 8 (eight) hours as needed for nausea or vomiting. 02/25/21  Yes Maximiano Coss, NP  ?predniSONE (DELTASONE) 20 MG tablet Take 1 tablet (20 mg total) by mouth daily with breakfast. 07/15/21  Yes Maximiano Coss, NP  ?promethazine-dextromethorphan (PROMETHAZINE-DM) 6.25-15 MG/5ML syrup Take 5 mLs by mouth 3 (three) times daily as needed for cough. 07/12/21  Yes [name], MD  ?simvastatin (ZOCOR) 40 MG tablet TAKE 1 TABLET AT BEDTIME 08/11/21  Yes [name], MD  ?cyclobenzaprine (FLEXERIL) 5 MG tablet Take 1 tablet (5 mg total) by mouth 3 (three) times daily as needed for muscle spasms. ?Patient not taking: Reported on 07/10/2021 03/01/21   Maximiano Coss, NP  ?diclofenac (VOLTAREN) 75 MG EC tablet Take 1 tablet (75 mg total) by mouth 2 (two) times daily. ?Patient not taking: Reported on 06/19/2021 03/01/21   Maximiano Coss, NP  ? ?Social History  ? ?Socioeconomic History  ? Marital status: Widowed  ?  Spouse name: Jenny Reichmann  ? Number of children: 2  ? Years of education: 32  ? Highest education level: Not on file  ?Occupational  History  ?  Employer: [employer]  ?Tobacco Use  ? Smoking status: Never  ?  Passive exposure: Never  ? Smokeless tobacco: Never  ?Vaping Use  ? Vaping Use: Never used  ?Substance and Sexual Activity  ? Alco

## 2021-08-22 NOTE — Patient Instructions (Addendum)
Covid test at home tonight, tomorrow - let me know if positive. ?Stop cold medicine as it has a decongestant that may be affecting your blood pressure.  ?Mucinex or mucinex DM if needed for now.  ?Start prednisone and augmentin  for sinus and chest symptoms. Albuterol need should lessen. Return to the clinic or go to the nearest emergency room if any of your symptoms worsen or new symptoms occur. ? ?Cough, Adult ?Coughing is a reflex that clears your throat and your airways (respiratory system). Coughing helps to heal and protect your lungs. It is normal to cough occasionally, but a cough that happens with other symptoms or lasts a long time may be a sign of a condition that needs treatment. An acute cough may only last 2-3 weeks, while a chronic cough may last 8 or more weeks. ?Coughing is commonly caused by: ?Infection of the respiratory systemby viruses or bacteria. ?Breathing in substances that irritate your lungs. ?Allergies. ?Asthma. ?Mucus that runs down the back of your throat (postnasal drip). ?Smoking. ?Acid backing up from the stomach into the esophagus (gastroesophageal reflux). ?Certain medicines. ?Chronic lung problems. ?Other medical conditions such as heart failure or a blood clot in the lung (pulmonary embolism). ?Follow these instructions at home: ?Medicines ?Take over-the-counter and prescription medicines only as told by your health care provider. ?Talk with your health care provider before you take a cough suppressant medicine. ?Lifestyle ? ?Avoid cigarette smoke. Do not use any products that contain nicotine or tobacco, such as cigarettes, e-cigarettes, and chewing tobacco. If you need help quitting, ask your health care provider. ?Drink enough fluid to keep your urine pale yellow. ?Avoid caffeine. ?Do not drink alcohol if your health care provider tells you not to drink. ?General instructions ? ?Pay close attention to changes in your cough. Tell your health care provider about them. ?Always  cover your mouth when you cough. ?Avoid things that make you cough, such as perfume, candles, cleaning products, or campfire or tobacco smoke. ?If the air is dry, use a cool mist vaporizer or humidifier in your bedroom or your home to help loosen secretions. ?If your cough is worse at night, try to sleep in a semi-upright position. ?Rest as needed. ?Keep all follow-up visits as told by your health care provider. This is important. ?Contact a health care provider if you: ?Have new symptoms. ?Cough up pus. ?Have a cough that does not get better after 2-3 weeks or gets worse. ?Cannot control your cough with cough suppressant medicines and you are losing sleep. ?Have pain that gets worse or pain that is not helped with medicine. ?Have a fever. ?Have unexplained weight loss. ?Have night sweats. ?Get help right away if: ?You cough up blood. ?You have difficulty breathing. ?Your heartbeat is very fast. ?These symptoms may represent a serious problem that is an emergency. Do not wait to see if the symptoms will go away. Get medical help right away. Call your local emergency services (911 in the [place] Do not drive yourself to the hospital. ?Summary ?Coughing is a reflex that clears your throat and your airways. It is normal to cough occasionally, but a cough that happens with other symptoms or lasts a long time may be a sign of a condition that needs treatment. ?Take over-the-counter and prescription medicines only as told by your health care provider. ?Always cover your mouth when you cough. ?Contact a health care provider if you have new symptoms or a cough that does not get better after 2-3 weeks  or gets worse. ?This information is not intended to replace advice given to you by your health care provider. Make sure you discuss any questions you have with your health care provider. ?Document Revised: 05/03/2018 Document Reviewed: 05/03/2018 ?Elsevier Patient Education ? [place]. ? ? ? ?If you have lab work done  today you will be contacted with your lab results within the next 2 weeks.  If you have not heard from [place] then please contact us. The fastest way to get your results is to register for My Chart. ? ? ?IF you received an x-ray today, you will receive an invoice from [facility]. Please contact [facility] at [phone] with questions or concerns regarding your invoice.  ? ?IF you received labwork today, you will receive an invoice from [place]. Please contact [facility] at [phone] with questions or concerns regarding your invoice.  ? ?Our billing staff will not be able to assist you with questions regarding bills from these companies. ? ?You will be contacted with the lab results as soon as they are available. The fastest way to get your results is to activate your My Chart account. Instructions are located on the last page of this paperwork. If you have not heard from [place] regarding the results in 2 weeks, please contact this office. ?  ? ?\ ?

## 2021-09-10 ENCOUNTER — Telehealth: Payer: Medicare HMO | Admitting: Adult Health

## 2021-09-12 ENCOUNTER — Telehealth: Payer: Self-pay

## 2021-09-12 ENCOUNTER — Ambulatory Visit (INDEPENDENT_AMBULATORY_CARE_PROVIDER_SITE_OTHER): Payer: Medicare HMO | Admitting: Family Medicine

## 2021-09-12 ENCOUNTER — Other Ambulatory Visit: Payer: Self-pay

## 2021-09-12 ENCOUNTER — Encounter: Payer: Self-pay | Admitting: Family Medicine

## 2021-09-12 ENCOUNTER — Other Ambulatory Visit (INDEPENDENT_AMBULATORY_CARE_PROVIDER_SITE_OTHER): Payer: Medicare HMO

## 2021-09-12 VITALS — BP 138/82 | HR 79 | Temp 97.6°F | Resp 18 | Ht 63.0 in | Wt 265.4 lb

## 2021-09-12 DIAGNOSIS — K760 Fatty (change of) liver, not elsewhere classified: Secondary | ICD-10-CM

## 2021-09-12 DIAGNOSIS — R748 Abnormal levels of other serum enzymes: Secondary | ICD-10-CM

## 2021-09-12 DIAGNOSIS — Z0000 Encounter for general adult medical examination without abnormal findings: Secondary | ICD-10-CM

## 2021-09-12 DIAGNOSIS — R7309 Other abnormal glucose: Secondary | ICD-10-CM | POA: Diagnosis not present

## 2021-09-12 DIAGNOSIS — R52 Pain, unspecified: Secondary | ICD-10-CM | POA: Diagnosis not present

## 2021-09-12 DIAGNOSIS — E559 Vitamin D deficiency, unspecified: Secondary | ICD-10-CM

## 2021-09-12 DIAGNOSIS — Z23 Encounter for immunization: Secondary | ICD-10-CM | POA: Diagnosis not present

## 2021-09-12 LAB — VITAMIN D 25 HYDROXY (VIT D DEFICIENCY, FRACTURES): VITD: 28.44 ng/mL — ABNORMAL LOW (ref 30.00–100.00)

## 2021-09-12 LAB — HEMOGLOBIN A1C: Hgb A1c MFr Bld: 6.2 % (ref 4.6–6.5)

## 2021-09-12 LAB — CBC WITH DIFFERENTIAL/PLATELET
Basophils Absolute: 0.1 10*3/uL (ref 0.0–0.1)
Basophils Relative: 1.4 % (ref 0.0–3.0)
Eosinophils Absolute: 0.4 10*3/uL (ref 0.0–0.7)
Eosinophils Relative: 6 % — ABNORMAL HIGH (ref 0.0–5.0)
HCT: 38.3 % (ref 36.0–46.0)
Hemoglobin: 12.2 g/dL (ref 12.0–15.0)
Lymphocytes Relative: 36.6 % (ref 12.0–46.0)
Lymphs Abs: 2.4 10*3/uL (ref 0.7–4.0)
MCHC: 32 g/dL (ref 30.0–36.0)
MCV: 84.8 fl (ref 78.0–100.0)
Monocytes Absolute: 0.5 10*3/uL (ref 0.1–1.0)
Monocytes Relative: 8.2 % (ref 3.0–12.0)
Neutro Abs: 3.2 10*3/uL (ref 1.4–7.7)
Neutrophils Relative %: 47.8 % (ref 43.0–77.0)
Platelets: 249 10*3/uL (ref 150.0–400.0)
RBC: 4.52 Mil/uL (ref 3.87–5.11)
RDW: 14.8 % (ref 11.5–15.5)
WBC: 6.6 10*3/uL (ref 4.0–10.5)

## 2021-09-12 LAB — LIPID PANEL
Cholesterol: 170 mg/dL (ref 0–200)
HDL: 51.1 mg/dL (ref >39.00)
NonHDL: 119.18
Total CHOL/HDL Ratio: 3
Triglycerides: 241 mg/dL — ABNORMAL HIGH (ref 0.0–149.0)
VLDL: 48.2 mg/dL — ABNORMAL HIGH (ref 0.0–40.0)

## 2021-09-12 LAB — BASIC METABOLIC PANEL
BUN: 11 mg/dL (ref 6–23)
CO2: 29 mEq/L (ref 19–32)
Calcium: 9.5 mg/dL (ref 8.4–10.5)
Chloride: 102 mEq/L (ref 96–112)
Creatinine, Ser: 0.74 mg/dL (ref 0.40–1.20)
GFR: 91.45 mL/min (ref >60.00)
Glucose, Bld: 117 mg/dL — ABNORMAL HIGH (ref 70–99)
Potassium: 4.7 mEq/L (ref 3.5–5.1)
Sodium: 139 mEq/L (ref 135–145)

## 2021-09-12 LAB — LDL CHOLESTEROL, DIRECT: Direct LDL: 81 mg/dL

## 2021-09-12 LAB — HEPATIC FUNCTION PANEL
ALT: 44 U/L — ABNORMAL HIGH (ref 0–35)
AST: 35 U/L (ref 0–37)
Albumin: 4.4 g/dL (ref 3.5–5.2)
Alkaline Phosphatase: 124 U/L — ABNORMAL HIGH (ref 39–117)
Bilirubin, Direct: 0.1 mg/dL (ref 0.0–0.3)
Total Bilirubin: 0.5 mg/dL (ref 0.2–1.2)
Total Protein: 7.1 g/dL (ref 6.0–8.3)

## 2021-09-12 LAB — GAMMA GT: GGT: 144 U/L — ABNORMAL HIGH (ref 7–51)

## 2021-09-12 LAB — TSH: TSH: 1.81 u[IU]/mL (ref 0.35–5.50)

## 2021-09-12 MED ORDER — VITAMIN D (ERGOCALCIFEROL) 1.25 MG (50000 UNIT) PO CAPS: 50000.0000 [IU] | ORAL_CAPSULE | ORAL | 0 refills | Status: DC

## 2021-09-12 MED ORDER — GABAPENTIN 600 MG PO TABS: 600.0000 mg | ORAL_TABLET | Freq: Every day | ORAL | 1 refills | Status: DC

## 2021-09-12 NOTE — Telephone Encounter (Signed)
-----  Message from [name], MD sent at 09/12/2021  3:57 PM EDT ----- Vit D is low.  Based on this, we need to start prescription 50,000 units weekly x12 weeks in addition to daily OTC supplement of at least 2000 units.   Triglycerides- fatty part of blood- are high.  This will improve w/ healthy diet and regular exercise  Your sugar is elevated.  Since you said you were fasting we will add an A1C to assess for diabetes.  ALT (liver function is stable) but Alk Phos is mildly elevated.  We will add a GGT to see if this is coming from the liver

## 2021-09-12 NOTE — Progress Notes (Signed)
   Subjective:    Patient ID: Renee Flores, female    DOB: [birth year], 56 y.o.   MRN: [id number]  HPI CPE- due for Tdap and mammo [facility]).  UTD on colonoscopy.  No need for pap due to hysterectomy.  Patient Care Team    Relationship Specialty Notifications Start End  [name], MD PCP - General Family Medicine  04/13/20   Park Liter, MD PCP - Cardiology Cardiology  04/30/20   Dohmeier, Asencion Partridge, MD Consulting Physician Neurology  09/11/20   Wallene Huh, [place] Consulting Physician Podiatry  09/11/20   Erle Crocker, MD Consulting Physician Orthopedic Surgery  09/11/20   Edythe Clarity, [facility] Pharmacist Pharmacist  06/17/21    Comment: [phone]    Health Maintenance  Topic Date Due   TETANUS/TDAP  04/28/2016   Zoster Vaccines- Shingrix (1 of 2) Never done   COVID-19 Vaccine (4 - Booster for Moderna series) 05/31/2020   MAMMOGRAM  08/23/2022 (Originally 05/02/2021)   INFLUENZA VACCINE  11/26/2021   COLONOSCOPY (Pts 45-56yr Insurance coverage will need to be confirmed)  08/04/2029   Hepatitis C Screening  Completed   HIV Screening  Completed   HPV VACCINES  Aged Out      Review of Systems Patient reports no vision changes, adenopathy,fever, weight change,  persistant/recurrent hoarseness , swallowing issues, chest pain, palpitations, edema, persistant/recurrent cough, hemoptysis, dyspnea (rest/exertional/paroxysmal nocturnal), gastrointestinal bleeding (melena, rectal bleeding), abdominal pain, significant heartburn, bowel changes, GU symptoms (dysuria, hematuria, incontinence), Gyn symptoms (abnormal  bleeding, pain),  syncope, focal weakness, memory loss, skin/hair/nail changes, abnormal bruising or bleeding, anxiety, or depression.   + bilateral hearing loss, R>L + numbness L foot s/p ankle surgery    Objective:   Physical Exam General Appearance:    Alert, cooperative, no distress, appears stated age, obese  Head:    Normocephalic, without obvious  abnormality, atraumatic  Eyes:    PERRL, conjunctiva/corneas clear, EOM's intact both eyes  Ears:    Normal TM's and external ear canals, both ears  Nose:   Nares normal, septum midline, mucosa normal, no drainage    or sinus tenderness  Throat:   Lips, mucosa, and tongue normal; teeth and gums normal  Neck:   Supple, symmetrical, trachea midline, no adenopathy;    Thyroid: no enlargement/tenderness/nodules  Back:     Symmetric, no curvature, ROM normal, no CVA tenderness  Lungs:     Clear to auscultation bilaterally, respirations unlabored  Chest Wall:    No tenderness or deformity   Heart:    Regular rate and rhythm, S1 and S2 normal, no murmur, rub   or gallop  Breast Exam:    Deferred to mammo  Abdomen:     Soft, non-tender, bowel sounds active all four quadrants,    no masses, no organomegaly  Genitalia:    Deferred  Rectal:    Extremities:   Extremities normal, atraumatic, no cyanosis or edema  Pulses:   2+ and symmetric all extremities  Skin:   Skin color, texture, turgor normal, no rashes or lesions  Lymph nodes:   Cervical, supraclavicular, and axillary nodes normal  Neurologic:   CNII-XII intact, normal strength, sensation and reflexes    throughout          Assessment & Plan:

## 2021-09-12 NOTE — Telephone Encounter (Signed)
Spoke w/ pt and advised of lab results . Vitamin D has been  sent to pharmacy

## 2021-09-12 NOTE — Assessment & Plan Note (Signed)
Pt's BMI is 47.  Encouraged low carb diet and regular physical activity such as walking b/c her weight will pose more health problems for her as she ages.  Check labs to risk stratify.  Will follow.

## 2021-09-12 NOTE — Assessment & Plan Note (Signed)
Check labs and replete prn. 

## 2021-09-12 NOTE — Assessment & Plan Note (Signed)
Pt's PE WNL w/ exception of obesity.  Due for mammo- pt to call [facility].  Tdap given.  Check labs.  Anticipatory guidance provided.

## 2021-09-12 NOTE — Patient Instructions (Addendum)
Follow up in 6 months to recheck BP and cholesterol We'll notify you of your lab results and make any changes if needed Continue to work on low carb diet and regular physical activity- you can do it!! Call and schedule your mammogram at [facility] Call with any questions or concerns Have a great summer!!

## 2021-09-13 ENCOUNTER — Telehealth: Payer: Self-pay

## 2021-09-13 NOTE — Telephone Encounter (Signed)
Spoke w/ pt and advised of her lab results

## 2021-09-13 NOTE — Telephone Encounter (Signed)
-----   Message from [name], MD sent at 09/13/2021  7:31 AM EDT ----- Your A1C shows that you are in the pre-diabetes range.  This will improve w/ low sugar/low carb diet and regular exercise.  Your GGT is elevated which means we need to ultrasound your liver to see what is going on.  Radiology should call you to schedule.

## 2021-09-16 ENCOUNTER — Telehealth: Payer: Self-pay

## 2021-09-16 NOTE — Telephone Encounter (Signed)
I would do either hot or cold compresses to the area- whichever feels better.  Continue the meloxicam daily.  She can continue to take Tylenol as needed as long as she doesn't exceed 4 tabs/day.

## 2021-09-16 NOTE — Telephone Encounter (Signed)
Patient states she has has a Tetanus shot last week. She states she is still having some redness , soreness, and warn to touch with a knot. Her concern is that she is on Meloxicam and was taking tylenol , but looked at her labs and GGT is elevated and would like a call about what to take.

## 2021-09-16 NOTE — Telephone Encounter (Signed)
Spoke w/ pt and advised to try hot/cold compresses to the area . Continue to take Meloxicam tylenol as needed dont exceed 4 tab/day per DR Birdie Riddle. Pt expressed verbal understanding .

## 2021-09-17 DIAGNOSIS — Z1231 Encounter for screening mammogram for malignant neoplasm of breast: Secondary | ICD-10-CM | POA: Diagnosis not present

## 2021-09-18 ENCOUNTER — Other Ambulatory Visit: Payer: Self-pay | Admitting: Family Medicine

## 2021-09-18 DIAGNOSIS — I10 Essential (primary) hypertension: Secondary | ICD-10-CM

## 2021-09-26 ENCOUNTER — Ambulatory Visit (INDEPENDENT_AMBULATORY_CARE_PROVIDER_SITE_OTHER): Payer: Medicare HMO

## 2021-09-26 DIAGNOSIS — Z0000 Encounter for general adult medical examination without abnormal findings: Secondary | ICD-10-CM

## 2021-09-26 NOTE — Progress Notes (Signed)
Subjective:   Renee Flores is a 57 y.o. female who presents for Medicare Annual (Subsequent) preventive examination.   I connected with Lorana Maffeo  today by telephone and verified that I am speaking with the correct person using two identifiers. Location patient: home Location provider: work Persons participating in the virtual visit: patient, provider.   I discussed the limitations, risks, security and privacy concerns of performing an evaluation and management service by telephone and the availability of in person appointments. I also discussed with the patient that there may be a patient responsible charge related to this service. The patient expressed understanding and verbally consented to this telephonic visit.    Interactive audio and video telecommunications were attempted between this provider and patient, however failed, due to patient having technical difficulties OR patient did not have access to video capability.  We continued and completed visit with audio only.    Review of Systems     Cardiac Risk Factors include: advanced age (>80mn, >>79women)     Objective:    Today's Vitals   There is no height or weight on file to calculate BMI.     09/26/2021    2:08 PM 04/27/2021   10:22 PM 07/30/2020    9:10 AM 07/01/2012    3:41 PM  Advanced Directives  Does Patient Have a Medical Advance Directive? No No No Patient does not have advance directive;Patient would not like information  Does patient want to make changes to medical advance directive?   Yes (MAU/Ambulatory/Procedural Areas - Information given)   Would patient like information on creating a medical advance directive? No - Patient declined Yes (ED - Information included in AVS)      Current Medications (verified) Outpatient Encounter Medications as of 09/26/2021  Medication Sig   albuterol (VENTOLIN HFA) 108 (90 Base) MCG/ACT inhaler Inhale 2 puffs into the lungs every 6 (six) hours as needed for wheezing  or shortness of breath.   aspirin EC 81 MG tablet Take 81 mg by mouth daily.   azelastine (ASTELIN) 0.1 % nasal spray Place 1 spray into both nostrils 2 (two) times daily. Use in each nostril as directed   Calcium Carb-Cholecalciferol (OYSTER SHELL CALCIUM W/D) 500-5 MG-MCG TABS TAKE 1 TABLET EVERY DAY   cloNIDine (CATAPRES) 0.1 MG tablet TAKE 1 TABLET THREE TIMES DAILY   cyclobenzaprine (FLEXERIL) 5 MG tablet Take 1 tablet (5 mg total) by mouth 3 (three) times daily as needed for muscle spasms.   diclofenac (VOLTAREN) 75 MG EC tablet Take 1 tablet (75 mg total) by mouth 2 (two) times daily.   fluticasone (FLONASE) 50 MCG/ACT nasal spray Place 2 sprays into both nostrils daily.   fluticasone-salmeterol (ADVAIR DISKUS) 250-50 MCG/ACT AEPB Inhale 1 puff into the lungs in the morning and at bedtime.   gabapentin (NEURONTIN) 600 MG tablet Take 1 tablet (600 mg total) by mouth at bedtime.   hydrochlorothiazide (HYDRODIURIL) 25 MG tablet Take 1 tablet (25 mg total) by mouth daily.   ketotifen (ZADITOR) 0.025 % ophthalmic solution Place 2 drops into both eyes daily as needed (for irritation).   losartan (COZAAR) 50 MG tablet Take 1 tablet (50 mg total) by mouth 2 (two) times daily.   meloxicam (MOBIC) 15 MG tablet TAKE 1 TABLET EVERY DAY   metoprolol succinate (TOPROL-XL) 50 MG 24 hr tablet Take 1 tablet (50 mg total) by mouth daily.   montelukast (SINGULAIR) 10 MG tablet TAKE 1 TABLET AT BEDTIME   Multiple Vitamins-Minerals (ONE-A-DAY  WOMENS 50+ ADVANTAGE PO) Take 1 tablet by mouth daily. Unknown strength   ofloxacin (FLOXIN OTIC) 0.3 % OTIC solution Place 5 drops into both ears daily.   omeprazole (PRILOSEC) 40 MG capsule TAKE 1 CAPSULE EVERY DAY   ondansetron (ZOFRAN) 4 MG tablet Take 1 tablet (4 mg total) by mouth every 8 (eight) hours as needed for nausea or vomiting.   promethazine-dextromethorphan (PROMETHAZINE-DM) 6.25-15 MG/5ML syrup Take 5 mLs by mouth 3 (three) times daily as needed for  cough.   simvastatin (ZOCOR) 40 MG tablet TAKE 1 TABLET AT BEDTIME   Vitamin D, Ergocalciferol, (DRISDOL) 1.25 MG (50000 UNIT) CAPS capsule Take 1 capsule (50,000 Units total) by mouth every 7 (seven) days.   No facility-administered encounter medications on file as of 09/26/2021.    Allergies (verified) Hydrochlorothiazide, Morphine, Erythromycin base, Erythromycin base, Vancomycin hcl, and Doxycycline   History: Past Medical History:  Diagnosis Date   Allergy    Arthritis    Bulging lumbar disc    L9-3   Complication of anesthesia    anxiety attack after waking up from anesthesia   Factor 5 Leiden mutation, heterozygous ([place])    Factor V deficiency, congenital ([place]) 03/23/2012   GERD (gastroesophageal reflux disease)    Heart murmur    Heart palpitations    Heart valve problem    mild- moderate mitral valve leakage    High cholesterol    HYPERCHOLESTEROLEMIA 02/27/2010   Qualifier: Diagnosis of  By: [name], Denise     Hypersomnia with sleep apnea, unspecified 11/03/2013   Hypertension    Lumbar radiculopathy 08/12/2018   MS (multiple sclerosis) (HCC)    questionable   Neuromuscular disorder (HCC)    OSA on CPAP 03/25/2017   Osteopenia 04/2016   T score -1.9   Osteoporosis    Preop cardiovascular exam 03/23/2012   Retrognathia 11/03/2013   Right knee DJD 07/08/2012   Severe obesity (BMI >= 40) ([place]) 11/03/2013   Shortness of breath 06/03/2013   Sleep apnea    Vitamin D deficiency 2014   17   Past Surgical History:  Procedure Laterality Date   ABDOMINAL HYSTERECTOMY     ANKLE FUSION Left 04/25/2021   APPENDECTOMY     arthroscopic knee surgery     CESAREAN SECTION     x2   chloecystectomy     CHOLECYSTECTOMY     ESOPHAGEAL DILATION  04/09/2012   KNEE LIGAMENT RECONSTRUCTION     laser throat polyps     Left ankle reconstructed     NECK LESION BIOPSY     PATELLA FRACTURE SURGERY     PELVIC LAPAROSCOPY     LSO   REPLACEMENT TOTAL KNEE Left 2012   REPLACEMENT  TOTAL KNEE Right 2014   TOE SURGERY     TONSILLECTOMY     TONSILLECTOMY  1979   TOTAL ABDOMINAL HYSTERECTOMY W/ BILATERAL SALPINGOOPHORECTOMY  2000   TOTAL KNEE ARTHROPLASTY Left 2012   TOTAL KNEE ARTHROPLASTY Right 07/08/2012   Procedure: TOTAL KNEE ARTHROPLASTY with revision components ;  Surgeon: Hessie Dibble, MD;  Location: [facility];  Service: Orthopedics;  Laterality: Right;  PATIENT HAS HX. OF FACTOR IV DISORFER   TUBAL LIGATION     Family History  Problem Relation Age of Onset   Heart disease Father    Hypertension Father    Hyperlipidemia Father    Cancer Father    Other Father        Respiratory problems/Factor 5 disorder/1 bypass, 2  valve replacement (mitral and aortic)    Hypertension Mother    Diabetes Mother    Hyperlipidemia Mother    Hypertension Sister    Colon cancer Paternal Aunt    Breast cancer Paternal Aunt 77   Heart disease Maternal Grandfather    Lymphoma Maternal Grandfather    Heart failure Maternal Grandfather    Colon cancer Paternal Grandmother    Hyperlipidemia Brother    Hypertension Brother    Hypertension Brother    Colon cancer Paternal 62    Breast cancer Paternal Aunt 33   Stroke Paternal Grandfather    Social History   Socioeconomic History   Marital status: Widowed    Spouse name: [name]   Number of children: 2   Years of education: 12   Highest education level: Not on file  Occupational History    Employer: [employer]  Tobacco Use   Smoking status: Never    Passive exposure: Never   Smokeless tobacco: Never  Vaping Use   Vaping Use: Never used  Substance and Sexual Activity   Alcohol use: Not Currently    Alcohol/week: 0.0 standard drinks   Drug use: No   Sexual activity: Yes    Birth control/protection: Surgical    Comment: HYST-1st intercourse 56 yo-Fewer than 5 partners  Other Topics Concern   Not on file  Social History Narrative   [name]  and lives at home with her husband.    Patient has two children.   Patient works as a Mudlogger for [employer].   Patient drinks one cup of caffeine daily.   Patient is right-handed.   Patient has a high school education.   Social Determinants of Health   Financial Resource Strain: Low Risk    Difficulty of Paying Living Expenses: Not hard at all  Food Insecurity: No Food Insecurity   Worried About Charity fundraiser in the Last Year: Never true   [place] in the Last Year: Never true  Transportation Needs: No Transportation Needs   Lack of Transportation (Medical): No   Lack of Transportation (Non-Medical): No  Physical Activity: Insufficiently Active   Days of Exercise per Week: 3 days   Minutes of Exercise per Session: 30 min  Stress: No Stress Concern Present   Feeling of Stress : Not at all  Social Connections: Moderately Integrated   Frequency of Communication with Friends and Family: Three times a week   Frequency of Social Gatherings with Friends and Family: Three times a week   Attends Religious Services: More than 4 times per year   Active Member of Clubs or Organizations: Yes   Attends Archivist Meetings: More than 4 times per year   Marital Status: Widowed    Tobacco Counseling Counseling given: Not Answered   Clinical Intake:  Pre-visit preparation completed: Yes  Pain : No/denies pain     Nutritional Risks: None Diabetes: No  How often do you need to have someone help you when you read instructions, pamphlets, or other written materials from your doctor or pharmacy?: 1 - Never What is the last grade level you completed in school?: High School  Diabetic?no   Interpreter Needed?: No  Information entered by :: L.Breck [place],LPN   Activities of Daily Living    09/26/2021    2:13 PM  In your present state of health, do you have any difficulty performing the following activities:  Hearing? 0  Vision? 0  Difficulty concentrating or making decisions? 0  Walking or  climbing stairs? 0  Dressing or bathing? 0  Doing errands, shopping? 0  Preparing Food and eating ? N  Using the Toilet? N  In the past six months, have you accidently leaked urine? N  Do you have problems with loss of bowel control? N  Managing your Medications? N  Managing your Finances? N  Housekeeping or managing your Housekeeping? N    Patient Care Team: [name], MD as PCP - General (Family Medicine) Park Liter, MD as PCP - Cardiology (Cardiology) Dohmeier, Asencion Partridge, MD as Consulting Physician (Neurology) Paulla Dolly Tamala Fothergill, DPM as Consulting Physician (Podiatry) Erle Crocker, MD as Consulting Physician (Orthopedic Surgery) Edythe Clarity, [facility] as Pharmacist (Pharmacist)  Indicate any recent Medical Services you may have received from other than [facility] providers in the past year (date may be approximate).     Assessment:   This is a routine wellness examination for Kiesha.  Hearing/Vision screen Vision Screening - Comments:: Annual eye exams wear glasses   Dietary issues and exercise activities discussed: Current Exercise Habits: Home exercise routine, Type of exercise: walking, Time (Minutes): 30, Frequency (Times/Week): 3, Weekly Exercise (Minutes/Week): 90, Intensity: Mild, Exercise limited by: None identified   Goals Addressed   None    Depression Screen    09/26/2021    2:05 PM 09/26/2021    2:02 PM 09/12/2021    7:51 AM 08/22/2021    3:29 PM 07/15/2021    3:13 PM 03/14/2021    8:39 AM 02/25/2021   12:33 PM  PHQ 2/9 Scores  PHQ - 2 Score 0 0 1 0 0 0 0  PHQ- 9 Score   7 0 0 3 3    Fall Risk    09/26/2021    2:09 PM 09/12/2021    7:50 AM 08/22/2021    3:29 PM 07/15/2021    3:13 PM 03/14/2021    8:39 AM  Fall Risk   Falls in the past year? 0 0 0 0 0  Number falls in past yr: 0 0 0 0   Injury with Fall? 0 0 0 0   Risk for fall due to :   No Fall Risks History of fall(s) No Fall Risks  Follow up Falls evaluation completed;Education  provided Falls evaluation completed Falls evaluation completed Falls evaluation completed Falls evaluation completed    [place]:  Any stairs in or around the home? Yes  If so, are there any without handrails? No  Home free of loose throw rugs in walkways, pet beds, electrical cords, etc? Yes  Adequate lighting in your home to reduce risk of falls? Yes   ASSISTIVE DEVICES UTILIZED TO PREVENT FALLS:  Life alert? No  Use of a cane, walker or w/c? No  Grab bars in the bathroom? Yes  Shower chair or bench in shower? Yes  Elevated toilet seat or a handicapped toilet? Yes    Cognitive Function:  Normal cognitive status assessed by telephone conversation by this Nurse Health Advisor. No abnormalities found.        Immunizations Immunization History  Administered Date(s) Administered   [employer] Vaccination 08/05/2019, 09/01/2019, 04/05/2020   Tdap 04/28/2006, 09/12/2021    TDAP status: Up to date  Flu Vaccine status: Declined, Education has been provided regarding the importance of this vaccine but patient still declined. Advised may receive this vaccine at local pharmacy or Health Dept. Aware to provide a  copy of the vaccination record if obtained from local pharmacy or Health Dept. Verbalized acceptance and understanding.  Pneumococcal vaccine status: Due, Education has been provided regarding the importance of this vaccine. Advised may receive this vaccine at local pharmacy or Health Dept. Aware to provide a copy of the vaccination record if obtained from local pharmacy or Health Dept. Verbalized acceptance and understanding.  Covid-19 vaccine status: Completed vaccines  Qualifies for Shingles Vaccine? Yes   Zostavax completed No   Shingrix Completed?: No.    Education has been provided regarding the importance of this vaccine. Patient has been advised to call insurance company to determine out of pocket expense if they have not yet  received this vaccine. Advised may also receive vaccine at local pharmacy or Health Dept. Verbalized acceptance and understanding.  Screening Tests Health Maintenance  Topic Date Due   COVID-19 Vaccine (4 - Booster for Moderna series) 09/28/2021 (Originally 05/31/2020)   Zoster Vaccines- Shingrix (1 of 2) 12/13/2021 (Originally 11/09/2016)   MAMMOGRAM  08/23/2022 (Originally 05/02/2020)   INFLUENZA VACCINE  11/26/2021   COLONOSCOPY (Pts 45-56yr Insurance coverage will need to be confirmed)  08/04/2029   TETANUS/TDAP  09/13/2031   Hepatitis C Screening  Completed   HIV Screening  Completed   HPV VACCINES  Aged Out    Health Maintenance  There are no preventive care reminders to display for this patient.  Colorectal cancer screening: Type of screening: Colonoscopy. Completed 08/05/2019. Repeat every 10 years  Mammogram status: Completed 09/17/2021. Repeat every year  Bone Density status: Ordered not of age . Pt provided with contact info and advised to call to schedule appt.  Lung Cancer Screening: (Low Dose CT Chest recommended if Age 55-80years, 30 pack-year currently smoking OR have quit w/in 15years.) does not qualify.   Lung Cancer Screening Referral: n/a  Additional Screening:  Hepatitis C Screening: does not qualify; Completed 09/11/2020  Vision Screening: Recommended annual ophthalmology exams for early detection of glaucoma and other disorders of the eye. Is the patient up to date with their annual eye exam?  Yes  Who is the provider or what is the name of the office in which the patient attends annual eye exams? Dr.Miller  If pt is not established with a provider, would they like to be referred to a provider to establish care? No .   Dental Screening: Recommended annual dental exams for proper oral hygiene  Community Resource Referral / Chronic Care Management: CRR required this visit?  No   CCM required this visit?  No      Plan:     I have personally reviewed  and noted the following in the patient's chart:   Medical and social history Use of alcohol, tobacco or illicit drugs  Current medications and supplements including opioid prescriptions.  Functional ability and status Nutritional status Physical activity Advanced directives List of other physicians Hospitalizations, surgeries, and ER visits in previous 12 months Vitals Screenings to include cognitive, depression, and falls Referrals and appointments  In addition, I have reviewed and discussed with patient certain preventive protocols, quality metrics, and best practice recommendations. A written personalized care plan for preventive services as well as general preventive health recommendations were provided to patient.     LRandel Pigg LPN   [id number]/12/[id number]  Nurse Notes: none

## 2021-09-26 NOTE — Patient Instructions (Signed)
Renee Flores , Thank you for taking time to come for your Medicare Wellness Visit. I appreciate your ongoing commitment to your health goals. Please review the following plan we discussed and let me know if I can assist you in the future.   Screening recommendations/referrals: Colonoscopy: 08/05/2019 Mammogram: 09/17/2021 Bone Density: not of age  Recommended yearly ophthalmology/optometry visit for glaucoma screening and checkup Recommended yearly dental visit for hygiene and checkup  Vaccinations: Influenza vaccine: declined  Pneumococcal vaccine: due  Tdap vaccine: 09/12/2021 Shingles vaccine: will consider     Advanced directives: none   Conditions/risks identified: none   Next appointment: none   Preventive Care 40-64 Years, Female Preventive care refers to lifestyle choices and visits with your health care provider that can promote health and wellness. What does preventive care include? A yearly physical exam. This is also called an annual well check. Dental exams once or twice a year. Routine eye exams. Ask your health care provider how often you should have your eyes checked. Personal lifestyle choices, including: Daily care of your teeth and gums. Regular physical activity. Eating a healthy diet. Avoiding tobacco and drug use. Limiting alcohol use. Practicing safe sex. Taking low-dose aspirin daily starting at age 74. Taking vitamin and mineral supplements as recommended by your health care provider. What happens during an annual well check? The services and screenings done by your health care provider during your annual well check will depend on your age, overall health, lifestyle risk factors, and family history of disease. Counseling  Your health care provider may ask you questions about your: Alcohol use. Tobacco use. Drug use. Emotional well-being. Home and relationship well-being. Sexual activity. Eating habits. Work and work Statistician. Method of  birth control. Menstrual cycle. Pregnancy history. Screening  You may have the following tests or measurements: Height, weight, and BMI. Blood pressure. Lipid and cholesterol levels. These may be checked every 5 years, or more frequently if you are over 46 years old. Skin check. Lung cancer screening. You may have this screening every year starting at age 28 if you have a 30-pack-year history of smoking and currently smoke or have quit within the past 15 years. Fecal occult blood test (FOBT) of the stool. You may have this test every year starting at age 74. Flexible sigmoidoscopy or colonoscopy. You may have a sigmoidoscopy every 5 years or a colonoscopy every 10 years starting at age 73. Hepatitis C blood test. Hepatitis B blood test. Sexually transmitted disease (STD) testing. Diabetes screening. This is done by checking your blood sugar (glucose) after you have not eaten for a while (fasting). You may have this done every 1-3 years. Mammogram. This may be done every 1-2 years. Talk to your health care provider about when you should start having regular mammograms. This may depend on whether you have a family history of breast cancer. BRCA-related cancer screening. This may be done if you have a family history of breast, ovarian, tubal, or peritoneal cancers. Pelvic exam and Pap test. This may be done every 3 years starting at age 70. Starting at age 13, this may be done every 5 years if you have a Pap test in combination with an HPV test. Bone density scan. This is done to screen for osteoporosis. You may have this scan if you are at high risk for osteoporosis. Discuss your test results, treatment options, and if necessary, the need for more tests with your health care provider. Vaccines  Your health care provider may recommend certain  vaccines, such as: Influenza vaccine. This is recommended every year. Tetanus, diphtheria, and acellular pertussis (Tdap, Td) vaccine. You may need a Td  booster every 10 years. Zoster vaccine. You may need this after age 54. Pneumococcal 13-valent conjugate (PCV13) vaccine. You may need this if you have certain conditions and were not previously vaccinated. Pneumococcal polysaccharide (PPSV23) vaccine. You may need one or two doses if you smoke cigarettes or if you have certain conditions. Talk to your health care provider about which screenings and vaccines you need and how often you need them. This information is not intended to replace advice given to you by your health care provider. Make sure you discuss any questions you have with your health care provider. Document Released: 05/11/2015 Document Revised: 01/02/2016 Document Reviewed: 02/13/2015 Elsevier Interactive Patient Education  2017 [facility] [facility] Prevention in the Home Falls can cause injuries. They can happen to people of all ages. There are many things you can do to make your home safe and to help prevent falls. What can I do on the outside of my home? Regularly fix the edges of walkways and driveways and fix any cracks. Remove anything that might make you trip as you walk through a door, such as a raised step or threshold. Trim any bushes or trees on the path to your home. Use bright outdoor lighting. Clear any walking paths of anything that might make someone trip, such as rocks or tools. Regularly check to see if handrails are loose or broken. Make sure that both sides of any steps have handrails. Any raised decks and porches should have guardrails on the edges. Have any leaves, snow, or ice cleared regularly. Use sand or salt on walking paths during winter. Clean up any spills in your garage right away. This includes oil or grease spills. What can I do in the bathroom? Use night lights. Install grab bars by the toilet and in the tub and shower. Do not use towel bars as grab bars. Use non-skid mats or decals in the tub or shower. If you need to sit down in the  shower, use a plastic, non-slip stool. Keep the floor dry. Clean up any water that spills on the floor as soon as it happens. Remove soap buildup in the tub or shower regularly. Attach bath mats securely with double-sided non-slip rug tape. Do not have throw rugs and other things on the floor that can make you trip. What can I do in the bedroom? Use night lights. Make sure that you have a light by your bed that is easy to reach. Do not use any sheets or blankets that are too big for your bed. They should not hang down onto the floor. Have a firm chair that has side arms. You can use this for support while you get dressed. Do not have throw rugs and other things on the floor that can make you trip. What can I do in the kitchen? Clean up any spills right away. Avoid walking on wet floors. Keep items that you use a lot in easy-to-reach places. If you need to reach something above you, use a strong step stool that has a grab bar. Keep electrical cords out of the way. Do not use floor polish or wax that makes floors slippery. If you must use wax, use non-skid floor wax. Do not have throw rugs and other things on the floor that can make you trip. What can I do with my stairs? Do  not leave any items on the stairs. Make sure that there are handrails on both sides of the stairs and use them. Fix handrails that are broken or loose. Make sure that handrails are as long as the stairways. Check any carpeting to make sure that it is firmly attached to the stairs. Fix any carpet that is loose or worn. Avoid having throw rugs at the top or bottom of the stairs. If you do have throw rugs, attach them to the floor with carpet tape. Make sure that you have a light switch at the top of the stairs and the bottom of the stairs. If you do not have them, ask someone to add them for you. What else can I do to help prevent falls? Wear shoes that: Do not have high heels. Have rubber bottoms. Are comfortable and  fit you well. Are closed at the toe. Do not wear sandals. If you use a stepladder: Make sure that it is fully opened. Do not climb a closed stepladder. Make sure that both sides of the stepladder are locked into place. Ask someone to hold it for you, if possible. Clearly mark and make sure that you can see: Any grab bars or handrails. First and last steps. Where the edge of each step is. Use tools that help you move around (mobility aids) if they are needed. These include: Canes. Walkers. Scooters. Crutches. Turn on the lights when you go into a dark area. Replace any light bulbs as soon as they burn out. Set up your furniture so you have a clear path. Avoid moving your furniture around. If any of your floors are uneven, fix them. If there are any pets around you, be aware of where they are. Review your medicines with your doctor. Some medicines can make you feel dizzy. This can increase your chance of falling. Ask your doctor what other things that you can do to help prevent falls. This information is not intended to replace advice given to you by your health care provider. Make sure you discuss any questions you have with your health care provider. Document Released: 02/08/2009 Document Revised: 09/20/2015 Document Reviewed: 05/19/2014 Elsevier Interactive Patient Education  2017 [employer].

## 2021-09-27 ENCOUNTER — Telehealth: Payer: Self-pay

## 2021-09-27 NOTE — Telephone Encounter (Signed)
Given to Dr. Birdie Riddle to review. Once reviewed the screening mammogram will be scanned into the chart.

## 2021-10-01 ENCOUNTER — Other Ambulatory Visit (HOSPITAL_BASED_OUTPATIENT_CLINIC_OR_DEPARTMENT_OTHER): Payer: Medicare HMO

## 2021-10-03 ENCOUNTER — Ambulatory Visit (HOSPITAL_BASED_OUTPATIENT_CLINIC_OR_DEPARTMENT_OTHER)
Admission: RE | Admit: 2021-10-03 | Discharge: 2021-10-03 | Disposition: A | Discharge status: Home / Self Care | Payer: Medicare HMO | Source: Ambulatory Visit | Attending: Family Medicine | Admitting: Family Medicine

## 2021-10-03 ENCOUNTER — Telehealth: Payer: Self-pay

## 2021-10-03 DIAGNOSIS — R945 Abnormal results of liver function studies: Secondary | ICD-10-CM | POA: Diagnosis not present

## 2021-10-03 DIAGNOSIS — R748 Abnormal levels of other serum enzymes: Secondary | ICD-10-CM | POA: Diagnosis not present

## 2021-10-03 DIAGNOSIS — R932 Abnormal findings on diagnostic imaging of liver and biliary tract: Secondary | ICD-10-CM | POA: Diagnosis not present

## 2021-10-03 NOTE — Telephone Encounter (Signed)
Spoke w/ pt and advised of [place] results and that we have referred her to a liver specialist .

## 2021-10-03 NOTE — Telephone Encounter (Signed)
-----   Message from [name], MD sent at 10/03/2021  1:33 PM EDT ----- Your ultrasound shows fatty liver.  This could be the cause of your elevated liver enzymes.  Since your labs are high and your ultrasound shows fatty liver, I'm going to refer you to a liver specialist to establish care and follow with over time (referral placed)

## 2021-10-03 NOTE — Addendum Note (Signed)
Addended by: [name] on: 10/03/2021 01:34 PM   Modules accepted: Orders

## 2021-10-09 ENCOUNTER — Telehealth: Payer: Self-pay | Admitting: Family Medicine

## 2021-10-22 ENCOUNTER — Telehealth: Payer: Medicare HMO | Admitting: Adult Health

## 2021-10-22 DIAGNOSIS — G4733 Obstructive sleep apnea (adult) (pediatric): Secondary | ICD-10-CM

## 2021-10-22 DIAGNOSIS — Z9989 Dependence on other enabling machines and devices: Secondary | ICD-10-CM

## 2021-10-23 DIAGNOSIS — M19072 Primary osteoarthritis, left ankle and foot: Secondary | ICD-10-CM | POA: Diagnosis not present

## 2021-10-24 ENCOUNTER — Other Ambulatory Visit: Payer: Self-pay | Admitting: Family Medicine

## 2021-10-24 DIAGNOSIS — K219 Gastro-esophageal reflux disease without esophagitis: Secondary | ICD-10-CM

## 2021-11-12 ENCOUNTER — Other Ambulatory Visit: Payer: Self-pay | Admitting: Family Medicine

## 2021-11-18 DIAGNOSIS — K760 Fatty (change of) liver, not elsewhere classified: Secondary | ICD-10-CM | POA: Diagnosis not present

## 2021-11-18 DIAGNOSIS — R748 Abnormal levels of other serum enzymes: Secondary | ICD-10-CM | POA: Diagnosis not present

## 2021-11-30 ENCOUNTER — Other Ambulatory Visit: Payer: Self-pay | Admitting: Family Medicine

## 2021-11-30 DIAGNOSIS — M5416 Radiculopathy, lumbar region: Secondary | ICD-10-CM

## 2021-11-30 DIAGNOSIS — T7840XA Allergy, unspecified, initial encounter: Secondary | ICD-10-CM

## 2021-12-18 DIAGNOSIS — Z1283 Encounter for screening for malignant neoplasm of skin: Secondary | ICD-10-CM | POA: Diagnosis not present

## 2021-12-18 DIAGNOSIS — D225 Melanocytic nevi of trunk: Secondary | ICD-10-CM | POA: Diagnosis not present

## 2021-12-18 DIAGNOSIS — L820 Inflamed seborrheic keratosis: Secondary | ICD-10-CM | POA: Diagnosis not present

## 2021-12-24 ENCOUNTER — Ambulatory Visit (INDEPENDENT_AMBULATORY_CARE_PROVIDER_SITE_OTHER): Payer: Medicare HMO | Admitting: Family Medicine

## 2021-12-24 ENCOUNTER — Encounter: Payer: Self-pay | Admitting: Family Medicine

## 2021-12-24 VITALS — BP 132/80 | HR 75 | Temp 98.0°F | Resp 18 | Ht 63.0 in | Wt 259.4 lb

## 2021-12-24 DIAGNOSIS — R052 Subacute cough: Secondary | ICD-10-CM | POA: Diagnosis not present

## 2021-12-24 MED ORDER — ALBUTEROL SULFATE HFA 108 (90 BASE) MCG/ACT IN AERS: 2.0000 | INHALATION_SPRAY | Freq: Four times a day (QID) | RESPIRATORY_TRACT | 2 refills | Status: DC | PRN

## 2021-12-24 MED ORDER — AMOXICILLIN 875 MG PO TABS: 875.0000 mg | ORAL_TABLET | Freq: Two times a day (BID) | ORAL | 0 refills | Status: AC

## 2021-12-24 MED ORDER — PROMETHAZINE-DM 6.25-15 MG/5ML PO SYRP
5.0000 mL | ORAL_SOLUTION | Freq: Three times a day (TID) | ORAL | 0 refills | Status: DC | PRN
Start: 2021-12-24 — End: 2022-09-16

## 2021-12-24 NOTE — Patient Instructions (Signed)
Follow up as needed or as scheduled START the Amoxicillin twice daily- take w/ food CONTINUE the Montelukast nightly ADD Claritin or Zyrtec daily in the morning Make sure you are using your Flonase every day Use the albuterol as needed for wheezing Use the cough syrup as needed- may cause drowsiness Call with any questions or concerns Hang in there!!

## 2021-12-24 NOTE — Progress Notes (Signed)
   Subjective:    Patient ID: Renee Flores, female    DOB: [birth year], 56 y.o.   MRN: [id number]  HPI Cough- pt reports she has had sxs for 'about 2 months'.  Cough is wet and intermittently productive.  No fevers.  Pt is taking Singulair at night.  Not taking any antihistamines at this time.  Has had intermittent wheezing- worse in the past week.  Using Albuterol prn.  Denies sinus pain/pressure.  R ear pain.   Review of Systems For ROS see HPI     Objective:   Physical Exam Vitals reviewed.  Constitutional:      General: She is not in acute distress.    Appearance: Normal appearance. She is well-developed. She is obese. She is not ill-appearing.  HENT:     Head: Normocephalic and atraumatic.     Right Ear: Tympanic membrane normal.     Left Ear: Tympanic membrane normal.     Nose: Mucosal edema and congestion present. No rhinorrhea.     Right Sinus: No maxillary sinus tenderness or frontal sinus tenderness.     Left Sinus: No maxillary sinus tenderness or frontal sinus tenderness.     Mouth/Throat:     Mouth: Mucous membranes are moist.     Pharynx: Posterior oropharyngeal erythema (w/ PND) present.  Eyes:     Conjunctiva/sclera: Conjunctivae normal.     Pupils: Pupils are equal, round, and reactive to light.  Cardiovascular:     Rate and Rhythm: Normal rate and regular rhythm.     Heart sounds: Normal heart sounds.  Pulmonary:     Effort: Pulmonary effort is normal. No respiratory distress.     Breath sounds: Normal breath sounds. No wheezing or rales.  Musculoskeletal:     Cervical back: Normal range of motion and neck supple.  Lymphadenopathy:     Cervical: No cervical adenopathy.  Skin:    General: Skin is warm and dry.  Neurological:     General: No focal deficit present.     Mental Status: She is alert and oriented to person, place, and time.  Psychiatric:        Mood and Affect: Mood normal.        Behavior: Behavior normal.        Thought Content: Thought  content normal.           Assessment & Plan:   Subacute cough- new.  Sxs started ~2 months ago but pt feels they have been worsening in the last week.  Now having increased wheezing- using Albuterol inhaler.  Refill sent.  Given underlying lung disease will start Amoxicillin to tx any possible bacterial infxn.  Encouraged addition of daily antihistamine.  Cough meds prn.  Reviewed supportive care and red flags that should prompt return.  Pt expressed understanding and is in agreement w/ plan.

## 2021-12-31 ENCOUNTER — Ambulatory Visit: Payer: Medicare HMO | Attending: Cardiology | Admitting: Cardiology

## 2021-12-31 ENCOUNTER — Encounter: Payer: Self-pay | Admitting: Cardiology

## 2021-12-31 VITALS — BP 140/72 | HR 71 | Ht 63.0 in | Wt 259.0 lb

## 2021-12-31 DIAGNOSIS — I10 Essential (primary) hypertension: Secondary | ICD-10-CM | POA: Diagnosis not present

## 2021-12-31 DIAGNOSIS — R002 Palpitations: Secondary | ICD-10-CM

## 2021-12-31 DIAGNOSIS — G4733 Obstructive sleep apnea (adult) (pediatric): Secondary | ICD-10-CM

## 2021-12-31 DIAGNOSIS — Z9989 Dependence on other enabling machines and devices: Secondary | ICD-10-CM | POA: Diagnosis not present

## 2021-12-31 DIAGNOSIS — K760 Fatty (change of) liver, not elsewhere classified: Secondary | ICD-10-CM

## 2021-12-31 DIAGNOSIS — R0789 Other chest pain: Secondary | ICD-10-CM

## 2021-12-31 DIAGNOSIS — R0602 Shortness of breath: Secondary | ICD-10-CM | POA: Diagnosis not present

## 2021-12-31 DIAGNOSIS — R748 Abnormal levels of other serum enzymes: Secondary | ICD-10-CM | POA: Diagnosis not present

## 2021-12-31 NOTE — Addendum Note (Signed)
Addended by: Jacobo Forest D on: 12/31/2021 09:47 AM   Modules accepted: Orders

## 2021-12-31 NOTE — Patient Instructions (Signed)
Medication Instructions:  Your physician recommends that you continue on your current medications as directed. Please refer to the Current Medication list given to you today.  *If you need a refill on your cardiac medications before your next appointment, please call your pharmacy*   Lab Work: BMP- today [address] [place] [address] If you have labs (blood work) drawn today and your tests are completely normal, you will receive your results only by: [place] (if you have MyChart) OR A paper copy in the mail If you have any lab test that is abnormal or we need to change your treatment, we will call you to review the results.   Testing/Procedures: None Ordered   Follow-Up: At [facility], you and your health needs are our priority.  As part of our continuing mission to provide you with exceptional heart care, we have created designated Provider Care Teams.  These Care Teams include your primary Cardiologist (physician) and Advanced Practice Providers (APPs -  Physician Assistants and Nurse Practitioners) who all work together to provide you with the care you need, when you need it.  We recommend signing up for the patient portal called "MyChart".  Sign up information is provided on this After Visit Summary.  MyChart is used to connect with patients for Virtual Visits (Telemedicine).  Patients are able to view lab/test results, encounter notes, upcoming appointments, etc.  Non-urgent messages can be sent to your provider as well.   To learn more about what you can do with MyChart, go to [email or web address].    Your next appointment:   6 month(s)  The format for your next appointment:   In Person  Provider:   [name], MD    Other Instructions NA

## 2021-12-31 NOTE — Progress Notes (Signed)
Cardiology Office Note:    Date:  12/31/2021   ID:  Renee Flores, DOB 08/25/1965, MRN [id number]  PCP:  Renee Minium, MD  Cardiologist:  Renee Campus, MD    Referring MD: Renee Minium, MD   Chief Complaint  Patient presents with   Follow-up    History of Present Illness:    Renee Flores is a 56 y.o. female with past medical history significant for essential hypertension, dyslipidemia, mitral regurgitation which is only trivial, palpitations, atypical chest pain, allergies.  She is in my office today for follow-up.  Cardiac wise doing well.  Denies have any chest pain tightness squeezing pressure burning chest, biggest issue is the fact that she had a lot of chronic cough she think she is allergic to multiple things.  Denies having any worsening of shortness of breath or swelling of lower extremities.  Past Medical History:  Diagnosis Date   Allergy    Arthritis    Bulging lumbar disc    P5-3   Complication of anesthesia    anxiety attack after waking up from anesthesia   Factor 5 Leiden mutation, heterozygous ([place])    Factor V deficiency, congenital ([place]) 03/23/2012   GERD (gastroesophageal reflux disease)    Heart murmur    Heart palpitations    Heart valve problem    mild- moderate mitral valve leakage    High cholesterol    HYPERCHOLESTEROLEMIA 02/27/2010   Qualifier: Diagnosis of  By: [name], Denise     Hypersomnia with sleep apnea, unspecified 11/03/2013   Hypertension    Lumbar radiculopathy 08/12/2018   MS (multiple sclerosis) (HCC)    questionable   Neuromuscular disorder (HCC)    OSA on CPAP 03/25/2017   Osteopenia 04/2016   T score -1.9   Osteoporosis    Preop cardiovascular exam 03/23/2012   Retrognathia 11/03/2013   Right knee DJD 07/08/2012   Severe obesity (BMI >= 40) ([place]) 11/03/2013   Shortness of breath 06/03/2013   Sleep apnea    Vitamin D deficiency 2014   17    Past Surgical History:  Procedure Laterality Date    ABDOMINAL HYSTERECTOMY     ANKLE FUSION Left 04/25/2021   APPENDECTOMY     arthroscopic knee surgery     CESAREAN SECTION     x2   chloecystectomy     CHOLECYSTECTOMY     ESOPHAGEAL DILATION  04/09/2012   KNEE LIGAMENT RECONSTRUCTION     laser throat polyps     Left ankle reconstructed     NECK LESION BIOPSY     PATELLA FRACTURE SURGERY     PELVIC LAPAROSCOPY     LSO   REPLACEMENT TOTAL KNEE Left 2012   REPLACEMENT TOTAL KNEE Right 2014   TOE SURGERY     TONSILLECTOMY     TONSILLECTOMY  1979   TOTAL ABDOMINAL HYSTERECTOMY W/ BILATERAL SALPINGOOPHORECTOMY  2000   TOTAL KNEE ARTHROPLASTY Left 2012   TOTAL KNEE ARTHROPLASTY Right 07/08/2012   Procedure: TOTAL KNEE ARTHROPLASTY with revision components ;  Surgeon: Hessie Dibble, MD;  Location: [facility];  Service: Orthopedics;  Laterality: Right;  PATIENT HAS HX. OF FACTOR IV DISORFER   TUBAL LIGATION      Current Medications: Current Meds  Medication Sig   albuterol (VENTOLIN HFA) 108 (90 Base) MCG/ACT inhaler Inhale 2 puffs into the lungs every 6 (six) hours as needed for wheezing or shortness of breath.   amoxicillin (AMOXIL) 875 MG tablet Take  1 tablet (875 mg total) by mouth 2 (two) times daily for 10 days.   aspirin EC 81 MG tablet Take 81 mg by mouth daily.   azelastine (ASTELIN) 0.1 % nasal spray Place 1 spray into both nostrils 2 (two) times daily. Use in each nostril as directed (Patient taking differently: Place 1 spray into both nostrils daily as needed for rhinitis or allergies. Use in each nostril as directed)   Calcium Carb-Cholecalciferol (OYSTER SHELL CALCIUM W/D) 500-5 MG-MCG TABS TAKE 1 TABLET EVERY DAY (Patient taking differently: Take 1 tablet by mouth daily.)   cetirizine (ZYRTEC) 10 MG tablet Take 10 mg by mouth in the morning.   cloNIDine (CATAPRES) 0.1 MG tablet TAKE 1 TABLET THREE TIMES DAILY (Patient taking differently: Take 0.1 mg by mouth 2 (two) times daily.)   cyclobenzaprine (FLEXERIL) 5 MG tablet Take  1 tablet (5 mg total) by mouth 3 (three) times daily as needed for muscle spasms.   fluticasone (FLONASE) 50 MCG/ACT nasal spray Place 2 sprays into both nostrils daily. (Patient taking differently: Place 2 sprays into both nostrils daily as needed for allergies.)   gabapentin (NEURONTIN) 600 MG tablet Take 1 tablet (600 mg total) by mouth at bedtime.   ketotifen (ZADITOR) 0.025 % ophthalmic solution Place 2 drops into both eyes daily as needed (for irritation).   losartan (COZAAR) 50 MG tablet Take 1 tablet (50 mg total) by mouth 2 (two) times daily.   meloxicam (MOBIC) 15 MG tablet TAKE 1 TABLET EVERY DAY (Patient taking differently: Take 15 mg by mouth daily.)   metoprolol succinate (TOPROL-XL) 50 MG 24 hr tablet Take 1 tablet (50 mg total) by mouth daily.   montelukast (SINGULAIR) 10 MG tablet TAKE 1 TABLET AT BEDTIME (Patient taking differently: Take 10 mg by mouth at bedtime.)   Multiple Vitamins-Minerals (ONE-A-DAY WOMENS 50+ ADVANTAGE PO) Take 1 tablet by mouth daily. Unknown strength   ofloxacin (FLOXIN OTIC) 0.3 % OTIC solution Place 5 drops into both ears daily. (Patient taking differently: Place 5 drops into both ears daily as needed (pain).)   omeprazole (PRILOSEC) 40 MG capsule TAKE 1 CAPSULE EVERY DAY (Patient taking differently: Take 40 mg by mouth daily.)   ondansetron (ZOFRAN) 4 MG tablet Take 1 tablet (4 mg total) by mouth every 8 (eight) hours as needed for nausea or vomiting.   promethazine-dextromethorphan (PROMETHAZINE-DM) 6.25-15 MG/5ML syrup Take 5 mLs by mouth 3 (three) times daily as needed for cough.   simvastatin (ZOCOR) 40 MG tablet TAKE 1 TABLET AT BEDTIME     Allergies:   Hydrochlorothiazide, Morphine, Vancomycin, Erythromycin base, Erythromycin base, Vancomycin hcl, and Doxycycline   Social History   Socioeconomic History   Marital status: Widowed    Spouse name: [name]   Number of children: 2   Years of education: 12   Highest education level: Not on file   Occupational History    Employer: [employer]  Tobacco Use   Smoking status: Never    Passive exposure: Never   Smokeless tobacco: Never  Vaping Use   Vaping Use: Never used  Substance and Sexual Activity   Alcohol use: Not Currently    Alcohol/week: 0.0 standard drinks of alcohol   Drug use: No   Sexual activity: Yes    Birth control/protection: Surgical    Comment: HYST-1st intercourse 56 yo-Fewer than 5 partners  Other Topics Concern   Not on file  Social History Narrative   [name]  and lives at  home with her husband.   Patient has two children.   Patient works as a Mudlogger for [employer].   Patient drinks one cup of caffeine daily.   Patient is right-handed.   Patient has a high school education.   Social Determinants of Health   Financial Resource Strain: Low Risk  (09/26/2021)   Overall Financial Resource Strain (CARDIA)    Difficulty of Paying Living Expenses: Not hard at all  Food Insecurity: No Food Insecurity (09/26/2021)   Hunger Vital Sign    Worried About Running Out of Food in the Last Year: Never true    Ran Out of Food in the Last Year: Never true  Transportation Needs: No Transportation Needs (09/26/2021)   PRAPARE - Hydrologist (Medical): No    Lack of Transportation (Non-Medical): No  Physical Activity: Insufficiently Active (09/26/2021)   Exercise Vital Sign    Days of Exercise per Week: 3 days    Minutes of Exercise per Session: 30 min  Stress: No Stress Concern Present (09/26/2021)   [place]    Feeling of Stress : Not at all  Social Connections: Moderately Integrated (09/26/2021)   Social Connection and Isolation Panel [NHANES]    Frequency of Communication with Friends and Family: Three times a week    Frequency of Social Gatherings with Friends and Family: Three times a week    Attends Religious Services: More than 4  times per year    Active Member of Clubs or Organizations: Yes    Attends Archivist Meetings: More than 4 times per year    Marital Status: Widowed     Family History: The patient's family history includes Breast cancer (age of onset: 40) in her paternal aunt; Breast cancer (age of onset: 76) in her paternal aunt; Cancer in her father; Colon cancer in her paternal aunt, paternal aunt, and paternal grandmother; Diabetes in her mother; Heart disease in her father and maternal grandfather; Heart failure in her maternal grandfather; Hyperlipidemia in her brother, father, and mother; Hypertension in her brother, brother, father, mother, and sister; Lymphoma in her maternal grandfather; Other in her father; Stroke in her paternal grandfather. ROS:   Please see the history of present illness.    All 14 point review of systems negative except as described per history of present illness  EKGs/Labs/Other Studies Reviewed:      Recent Labs: 09/12/2021: ALT 44; BUN 11; Creatinine, Ser 0.74; Hemoglobin 12.2; Platelets 249.0; Potassium 4.7; Sodium 139; TSH 1.81  Recent Lipid Panel    Component Value Date/Time   CHOL 170 09/12/2021 0828   TRIG 241.0 (H) 09/12/2021 0828   HDL 51.10 09/12/2021 0828   CHOLHDL 3 09/12/2021 0828   VLDL 48.2 (H) 09/12/2021 0828   LDLCALC 85 03/14/2021 0906   LDLDIRECT 81.0 09/12/2021 0828    Physical Exam:    VS:  BP (!) 140/72 (BP Location: Left Arm, Patient Position: Sitting)   Pulse 71   Ht '5\' 3"'$  (1.6 m)   Wt 259 lb (117.5 kg)   SpO2 93%   BMI 45.88 kg/m     Wt Readings from Last 3 Encounters:  12/31/21 259 lb (117.5 kg)  12/24/21 259 lb 6 oz (117.7 kg)  09/12/21 265 lb 6.4 oz (120.4 kg)     GEN:  Well nourished, well developed in no acute distress HEENT: Normal NECK: No JVD; No carotid bruits LYMPHATICS: No lymphadenopathy CARDIAC:  RRR, no murmurs, no rubs, no gallops RESPIRATORY:  Clear to auscultation without rales, wheezing or rhonchi   ABDOMEN: Soft, non-tender, non-distended MUSCULOSKELETAL:  No edema; No deformity  SKIN: Warm and dry LOWER EXTREMITIES: no swelling NEUROLOGIC:  Alert and oriented x 3 PSYCHIATRIC:  Normal affect   ASSESSMENT:    1. Primary hypertension   2. Elevated liver enzymes   3. Fatty liver   4. OSA on CPAP   5. Atypical chest pain   6. Palpitations   7. Shortness of breath   8. Severe obesity (BMI >= 40) (HCC)    PLAN:    In order of problems listed above:  Essential hypertension, uncontrolled.  I will ask her to have Chem-7 done with Chem-7 is fine we will decide about diuretics either hydrochlorothiazide or Aldactone depends what potassium and kidney function will be. Very liver function is only minimally that being followed by internal medicine team feeling is that this is related to fatty liver Obstructive sleep apnea managed by primary care physician Atypical chest pain denies having any, stress test recently was done negative. Obesity: Noted.  She lost few pounds I congratulated her for it. Dyslipidemia I did review K PN which show me LDL 85 HDL 51.  Decent cholesterol profile continue present management   Medication Adjustments/Labs and Tests Ordered: Current medicines are reviewed at length with the patient today.  Concerns regarding medicines are outlined above.  No orders of the defined types were placed in this encounter.  Medication changes: No orders of the defined types were placed in this encounter.   Signed, Park Liter, MD, [facility] 12/31/2021 9:39 AM    [place]

## 2022-01-01 LAB — BASIC METABOLIC PANEL
BUN/Creatinine Ratio: 12 (ref 9–23)
BUN: 9 mg/dL (ref 6–24)
CO2: 23 mmol/L (ref 20–29)
Calcium: 9.8 mg/dL (ref 8.7–10.2)
Chloride: 102 mmol/L (ref 96–106)
Creatinine, Ser: 0.76 mg/dL (ref 0.57–1.00)
Glucose: 116 mg/dL — ABNORMAL HIGH (ref 70–99)
Potassium: 4.5 mmol/L (ref 3.5–5.2)
Sodium: 141 mmol/L (ref 134–144)
eGFR: 92 mL/min/{1.73_m2} (ref >59)

## 2022-01-02 NOTE — Progress Notes (Unsigned)
Chronic Care Management Pharmacy Note  01/02/2022 Name:  Renee Flores MRN:  [id number] DOB:  [birth year]  Summary: Initial visit with PharmD.  BP elevated at home, checking before meds.  Having allergy symptoms at home and lingering cough.  Also has not had DEXA since 2018.  Recommendations/Changes made from today's visit: Updated DEXA Requesting rx for Promethazine-DM Monitor BP at home varying times of day  Plan: FU 6 month PharmD BP check 1 month with CMA   Subjective: Renee Flores is an 56 y.o. year old female who is a primary patient of Tabori, Aundra Millet, MD.  The CCM team was consulted for assistance with disease management and care coordination needs.    Engaged with patient by telephone for initial visit in response to provider referral for pharmacy case management and/or care coordination services.   Consent to Services:  The patient was given the following information about Chronic Care Management services today, agreed to services, and gave verbal consent: 1. CCM service includes personalized support from designated clinical staff supervised by the primary care provider, including individualized plan of care and coordination with other care providers 2. 24/7 contact phone numbers for assistance for urgent and routine care needs. 3. Service will only be billed when office clinical staff spend 20 minutes or more in a month to coordinate care. 4. Only one practitioner may furnish and bill the service in a calendar month. 5.The patient may stop CCM services at any time (effective at the end of the month) by phone call to the office staff. 6. The patient will be responsible for cost sharing (co-pay) of up to 20% of the service fee (after annual deductible is met). Patient agreed to services and consent obtained.  Patient Care Team: [name], MD as PCP - General (Family Medicine) Park Liter, MD as PCP - Cardiology (Cardiology) Dohmeier, Asencion Partridge, MD as  Consulting Physician (Neurology) Paulla Dolly Tamala Fothergill, DPM as Consulting Physician (Podiatry) Erle Crocker, MD as Consulting Physician (Orthopedic Surgery) Edythe Clarity, [facility] as Pharmacist (Pharmacist)  Recent office visits:  07/15/21 Renee Coss, NP - Lower respiratory infection - azithromycin (ZITHROMAX) 250 MG tablet,  fluticasone-salmeterol (ADVAIR DISKUS) 250-50 MCG/ACT AEPB, and predniSONE (DELTASONE) 20 MG tablet prescribed. Follow up if no improvement.    Recent consult visits:  12/31/21 Agustin Cree, Cardiology) - planned to start HCTZ or spironolactone depending on potassium levels and kidney function.   Hospital visits: 04/27/21 Medication Reconciliation was completed by comparing discharge summary, patient's EMR and Pharmacy list, and upon discussion with patient.   Admitted to the hospital on 04/27/21 due to Post Op Pain. Discharge date was 04/28/21. Discharged from [facility].     New?Medications Started at [facility] Discharge:?? None noted.    Medication Changes at Hospital Discharge: None noted.    Medications Discontinued at Hospital Discharge: None noted.    Medications that remain the same after Hospital Discharge:??  All other medications will remain the same.     Hospital visits: 03/23/21 Medication Reconciliation was completed by comparing discharge summary, patient's EMR and Pharmacy list, and upon discussion with patient.   Admitted to the hospital on 03/23/21 due to Acute cough. Discharge date was 03/23/21. Discharged from [facility].     New?Medications Started at [facility] Discharge:?? albuterol (VENTOLIN HFA) 108 (90 Base) MCG/ACT inhaler   Medication Changes at Hospital Discharge: None noted.    Medications Discontinued at Hospital Discharge: None noted.    Medications that remain the same  after Hospital Discharge:??  All other medications will remain the same.  Objective:  Lab Results  Component Value Date    CREATININE 0.76 12/31/2021   BUN 9 12/31/2021   GFR 91.45 09/12/2021   EGFR 92 12/31/2021   GFRNONAA >60 04/27/2021   GFRAA >90 06/01/2013   NA 141 12/31/2021   K 4.5 12/31/2021   CALCIUM 9.8 12/31/2021   CO2 23 12/31/2021   GLUCOSE 116 (H) 12/31/2021    Lab Results  Component Value Date/Time   HGBA1C 6.2 09/12/2021 04:22 PM   HGBA1C 6.1 09/12/2020 12:33 PM   GFR 91.45 09/12/2021 08:28 AM   GFR 96.45 03/14/2021 09:06 AM    Last diabetic Eye exam: No results found for: "HMDIABEYEEXA"  Last diabetic Foot exam: No results found for: "HMDIABFOOTEX"   Lab Results  Component Value Date   CHOL 170 09/12/2021   HDL 51.10 09/12/2021   LDLCALC 85 03/14/2021   LDLDIRECT 81.0 09/12/2021   TRIG 241.0 (H) 09/12/2021   CHOLHDL 3 09/12/2021       Latest Ref Rng & Units 09/12/2021    8:28 AM 03/14/2021    9:06 AM 09/26/2020    8:17 AM  Hepatic Function  Total Protein 6.0 - 8.3 g/dL 7.1  7.5  6.9   Albumin 3.5 - 5.2 g/dL 4.4  4.6  4.2   AST 0 - 37 U/L 35  35  48   ALT 0 - 35 U/L 44  47  56   Alk Phosphatase 39 - 117 U/L 124  99  86   Total Bilirubin 0.2 - 1.2 mg/dL 0.5  0.4  0.5   Bilirubin, Direct 0.0 - 0.3 mg/dL 0.1  0.1  0.1     Lab Results  Component Value Date/Time   TSH 1.81 09/12/2021 08:28 AM   TSH 1.99 03/14/2021 09:06 AM       Latest Ref Rng & Units 09/12/2021    8:28 AM 04/27/2021   10:40 PM 03/14/2021    9:06 AM  CBC  WBC 4.0 - 10.5 K/uL 6.6  10.2  7.1   Hemoglobin 12.0 - 15.0 g/dL 12.2  11.5  12.9   Hematocrit 36.0 - 46.0 % 38.3  36.1  40.2   Platelets 150.0 - 400.0 K/uL 249.0  269  236.0     Lab Results  Component Value Date/Time   VD25OH 28.44 (L) 09/12/2021 08:28 AM   VD25OH 28.82 (L) 09/11/2020 10:37 AM    Clinical ASCVD: No  The 10-year ASCVD risk score (Arnett DK, et al., 2019) is: 5.5%   Values used to calculate the score:     Age: 84 years     Sex: Female     Is Non-Hispanic African American: No     Diabetic: Yes     Tobacco smoker: No      Systolic Blood Pressure: 989 mmHg     Is BP treated: Yes     HDL Cholesterol: 51.1 mg/dL     Total Cholesterol: 170 mg/dL       12/24/2021    8:39 AM 09/26/2021    2:05 PM 09/26/2021    2:02 PM  Depression screen PHQ 2/9  Decreased Interest 0 0 0  Down, Depressed, Hopeless 3 0 0  PHQ - 2 Score 3 0 0  Altered sleeping 1    Tired, decreased energy 0    Change in appetite 0    Feeling bad or failure about yourself  0    Trouble  concentrating 0    Moving slowly or fidgety/restless 0    Suicidal thoughts 0    PHQ-9 Score 4    Difficult doing work/chores Not difficult at all       Social History   Tobacco Use  Smoking Status Never   Passive exposure: Never  Smokeless Tobacco Never   BP Readings from Last 3 Encounters:  12/31/21 (!) 140/72  12/24/21 132/80  09/12/21 138/82   Pulse Readings from Last 3 Encounters:  12/31/21 71  12/24/21 75  09/12/21 79   Wt Readings from Last 3 Encounters:  12/31/21 259 lb (117.5 kg)  12/24/21 259 lb 6 oz (117.7 kg)  09/12/21 265 lb 6.4 oz (120.4 kg)   BMI Readings from Last 3 Encounters:  12/31/21 45.88 kg/m  12/24/21 45.95 kg/m  09/12/21 47.01 kg/m    Assessment/Interventions: Review of patient past medical history, allergies, medications, health status, including review of consultants reports, laboratory and other test data, was performed as part of comprehensive evaluation and provision of chronic care management services.   SDOH:  (Social Determinants of Health) assessments and interventions performed: Yes SDOH Interventions    Flowsheet Row Clinical Support from 09/26/2021 in [place] from 07/30/2020 in [place]  SDOH Interventions    Food Insecurity Interventions Intervention Not Indicated --  Housing Interventions Intervention Not Indicated --  Transportation Interventions Intervention Not Indicated --  Financial Strain  Interventions Intervention Not Indicated --  Physical Activity Interventions Intervention Not Indicated Intervention Not Indicated  [pt has set a goal to increase activity]  Stress Interventions Intervention Not Indicated --  Social Connections Interventions Intervention Not Indicated --      Financial Resource Strain: Low Risk  (09/26/2021)   Overall Financial Resource Strain (CARDIA)    Difficulty of Paying Living Expenses: Not hard at all   Food Insecurity: No Food Insecurity (09/26/2021)   Hunger Vital Sign    Worried About Running Out of Food in the Last Year: Never true    [place] in the Last Year: Never true    [place]: No Food Insecurity (09/26/2021)  Housing: Low Risk  (09/26/2021)  Transportation Needs: No Transportation Needs (09/26/2021)  Alcohol Screen: Low Risk  (09/26/2021)  Depression (PHQ2-9): Low Risk  (12/24/2021)  Financial Resource Strain: Low Risk  (09/26/2021)  Physical Activity: Insufficiently Active (09/26/2021)  Social Connections: Moderately Integrated (09/26/2021)  Stress: No Stress Concern Present (09/26/2021)  Tobacco Use: Low Risk  (12/31/2021)    CCM Care Plan  Allergies  Allergen Reactions   Hydrochlorothiazide Nausea And Vomiting    Other reaction(s): Other (See Comments) Leg Cramps Leg Cramps  Other reaction(s): Other (See Comments) Leg Cramps   Morphine Other (See Comments), Rash and Nausea And Vomiting    Hallucinating  Other reaction(s): Other (See Comments) Hallucinating Hallucinating Hallucinating Hallucinating  Hallucinating  Hallucinating  Other reaction(s): Other (See Comments) Hallucinating Hallucinating   Vancomycin Nausea And Vomiting    Ringing in ears   Erythromycin Base Hives   Erythromycin Base Hives   Vancomycin Hcl Other (See Comments)    Ringing in ears   Doxycycline Nausea Only and Rash    Medications Reviewed Today     Reviewed by Darrel Reach, CMA (Certified Medical Assistant) on  12/31/21 at 0910  Med List Status: <None>   Medication Order Taking? Sig Documenting Provider Last Dose Status Informant  albuterol (VENTOLIN HFA) 108 (90 Base)  MCG/ACT inhaler [id number]  Inhale 2 puffs into the lungs every 6 (six) hours as needed for wheezing or shortness of breath. [name], MD  Active   amoxicillin (AMOXIL) 875 MG tablet [id number]  Take 1 tablet (875 mg total) by mouth 2 (two) times daily for 10 days. [name], MD  Active   aspirin EC 81 MG tablet [id number]  Take 81 mg by mouth daily. [provider]  Active   azelastine (ASTELIN) 0.1 % nasal spray [id number]  Place 1 spray into both nostrils 2 (two) times daily. Use in each nostril as directed Renee Coss, NP  Active   Calcium Carb-Cholecalciferol (OYSTER SHELL CALCIUM W/D) 500-5 MG-MCG TABS [id number]  TAKE 1 TABLET EVERY DAY [name], MD  Active   cloNIDine (CATAPRES) 0.1 MG tablet [id number]  TAKE 1 TABLET THREE TIMES DAILY [name], MD  Active   cyclobenzaprine (FLEXERIL) 5 MG tablet [id number]  Take 1 tablet (5 mg total) by mouth 3 (three) times daily as needed for muscle spasms. Renee Coss, NP  Active   fluticasone [facility]) 50 MCG/ACT nasal spray [id number]  Place 2 sprays into both nostrils daily. [name], MD  Active   gabapentin (NEURONTIN) 600 MG tablet [id number]  Take 1 tablet (600 mg total) by mouth at bedtime. [name], MD  Active   ketotifen (ZADITOR) 0.025 % ophthalmic solution [id number]  Place 2 drops into both eyes daily as needed (for irritation). [name], MD  Active   losartan (COZAAR) 50 MG tablet [id number]  Take 1 tablet (50 mg total) by mouth 2 (two) times daily. Park Liter, MD  Active   meloxicam [facility]) 15 MG tablet [id number]  TAKE 1 TABLET EVERY DAY [name], MD  Active   metoprolol succinate (TOPROL-XL) 50 MG 24 hr tablet [id number]  Take 1 tablet (50 mg total) by mouth daily. Park Liter, MD  Active   montelukast (SINGULAIR) 10 MG tablet [id number]  TAKE 1 TABLET AT BEDTIME [name], MD  Active   Multiple Vitamins-Minerals (ONE-A-DAY WOMENS 50+ ADVANTAGE PO) [id number]  Take 1 tablet by mouth daily. Unknown strength [provider]  Active   ofloxacin (FLOXIN OTIC) 0.3 % OTIC solution [id number]  Place 5 drops into both ears daily. Renee Coss, NP  Active   omeprazole (PRILOSEC) 40 MG capsule [id number]  TAKE 1 CAPSULE EVERY DAY [name], MD  Active   ondansetron (ZOFRAN) 4 MG tablet [id number]  Take 1 tablet (4 mg total) by mouth every 8 (eight) hours as needed for nausea or vomiting. Renee Coss, NP  Active   promethazine-dextromethorphan (PROMETHAZINE-DM) 6.25-15 MG/5ML syrup [id number]  Take 5 mLs by mouth 3 (three) times daily as needed for cough. [name], MD  Active   simvastatin (ZOCOR) 40 MG tablet [id number]  TAKE 1 TABLET AT BEDTIME [name], MD  Active             Patient Active Problem List   Diagnosis Date Noted   Elevated liver enzymes 12/31/2021   Fatty liver 12/31/2021   Morbidly obese ([place]) 03/01/2021   Dependence on CPAP ventilation 03/01/2021   Atypical chest pain 09/19/2020   Palpitations    Osteoporosis    Neuromuscular disorder ([place])    MS (multiple sclerosis) (HCC)    Heart valve problem    Heart murmur    GERD (gastroesophageal reflux disease)    Factor 5 Leiden mutation, heterozygous ([place])  Complication of anesthesia    Bulging lumbar disc    Arthritis    Hypertension 04/13/2020   Lumbar radiculopathy 08/12/2018   OSA on CPAP 03/25/2017   Hypersomnia with sleep apnea, unspecified 11/03/2013   Severe obesity (BMI >= 40) ([place]) 11/03/2013   Retrognathia 11/03/2013   Shortness of breath 06/03/2013   Right knee DJD 07/08/2012    Class: Chronic   Physical exam 03/23/2012   Factor V deficiency, 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     Medication Assistance: None required.  Patient affirms current coverage meets needs.  Compliance/Adherence/Medication fill history: Care Gaps: DEXA scan/mammogram  Star-Rating Drugs: losartan (COZAAR) 50 MG tablet - last filled 06/20/21 90 days   Patient's preferred pharmacy is:  [id number] [place], [place] - [address] [place] [address]Phone: [phone]: [phone] [id number]: [phone]: [phone] Uses pill box? Yes Pt endorses 100% compliance  We discussed: Benefits of medication synchronization, packaging and delivery as well as enhanced pharmacist oversight with [employer]. Patient decided to: Continue current medication management strategy  Care Plan and Follow Up Patient Decision:  Patient agrees to Care Plan and Follow-up.  Plan: The care management team will reach out to the patient again over the next 180 days.  CBeverly Milch PharmD Clinical Pharmacist  [facility]([phone]  Current Barriers:  Elevated BP at home  Pharmacist Clinical Goal(s):  Patient will achieve control of BP as evidenced by monitoring through collaboration with PharmD and provider.   Interventions: 1:1 collaboration with TMidge Minium MD regarding development and update of comprehensive plan of care as evidenced by provider attestation and co-signature Inter-disciplinary care team collaboration (see longitudinal plan of  care) Comprehensive medication review performed; medication list updated in electronic medical record  Hypertension (BP goal <140/90) -Uncontrolled -Current treatment: Clonidone 0.152mthree times per day Appropriate, Effective, Safe, Accessible Losartan 5047mwice daily Appropriate, Effective, Safe, Accessible Metoprolol XL  69m28mily Appropriate, Effective, Safe, Accessible -Medications previously tried: HCTZ  -Current home readings: 154/93 P 82 (without medication),  -Denies hypotensive/hypertensive symptoms -Educated on BP goals and benefits of medications for prevention of heart attack, stroke and kidney damage; Daily salt intake goal < 2300 mg; Exercise goal of 150 minutes per week; Importance of home blood pressure monitoring; -Counseled to monitor BP at home a few times per week before and after medications, document, and provide log at future appointments -Recommended to continue current medication CMA BP check next month - can adjust meds at that time if needed  Hyperlipidemia: (LDL goal < 100) -Controlled -Current treatment: Simvastatin 40mg61mropriate, Effective, Safe, Accessible -Medications previously tried: none noted  -Educated on Cholesterol goals;  Benefits of statin for ASCVD risk reduction; Importance of limiting foods high in cholesterol; -Recommended to continue current medication LDL controlled at this time - no adverse effects with medication   Osteopenia (Goal Maintain bone density, prevent fractures/falls) -Controlled -Last DEXA Scan: 2018  T-Score total hip: -1.9  T-Score lumbar spine: 0.4 -Patient is not a candidate for pharmacologic treatment -Current treatment  Oyster Shell Calcium + Vitamin D Appropriate, Effective, Safe, Accessible -Medications previously tried: none noted -Recommend [phone] units of vitamin D daily. Recommend 1200 mg of calcium daily from dietary and supplemental sources. Recommend weight-bearing and muscle strengthening  exercises for building and maintaining bone density. Will need repeat DEXA scan. -Recommended to continue current medication Recommended repeat DEXA scan to determine if bone density has progressed or not.  Allergies (Goal: Minimize symptoms) -Controlled -Current treatment  Montelukast 31m hs Appropriate, Effective, Safe, Accessible Flonase 527m prn Appropriate, Effective, Safe, Accessible -Medications previously tried: none noted -Having some allergies as the seasons change.  Reports Singulair as effective for symptoms.  She requests a refill on her Promethazine-DM syrup for a lingering cough she is having. -Recommended to continue current medication Will consult with PCP on appropriateness of Promethazine-DM refill.  Patient Goals/Self-Care Activities Patient will:  - take medications as prescribed as evidenced by patient report and record review check blood pressure a few times per week, document, and provide at future appointments  Follow Up Plan: The care management team will reach out to the patient again over the next 180 days.

## 2022-01-03 ENCOUNTER — Telehealth: Payer: Self-pay

## 2022-01-03 DIAGNOSIS — I10 Essential (primary) hypertension: Secondary | ICD-10-CM

## 2022-01-03 MED ORDER — SPIRONOLACTONE 25 MG PO TABS: 12.5000 mg | ORAL_TABLET | Freq: Every day | ORAL | 3 refills | Status: DC

## 2022-01-03 NOTE — Telephone Encounter (Signed)
Spoke with pt about lab results per Dr. Lydiana Poet notes. Will call in Aldactone '25mg'$  q d to pharmacy. Pt agreed to get BMP in 1 week. She had no further questions.

## 2022-01-03 NOTE — Telephone Encounter (Signed)
error 

## 2022-01-05 ENCOUNTER — Other Ambulatory Visit: Payer: Self-pay | Admitting: Family Medicine

## 2022-01-05 DIAGNOSIS — E1169 Type 2 diabetes mellitus with other specified complication: Secondary | ICD-10-CM

## 2022-01-06 ENCOUNTER — Ambulatory Visit (INDEPENDENT_AMBULATORY_CARE_PROVIDER_SITE_OTHER)
Admission: RE | Admit: 2022-01-06 | Discharge: 2022-01-06 | Disposition: A | Discharge status: Home / Self Care | Payer: Medicare HMO | Source: Ambulatory Visit | Attending: Family Medicine | Admitting: Family Medicine

## 2022-01-06 ENCOUNTER — Encounter: Payer: Self-pay | Admitting: Family Medicine

## 2022-01-06 ENCOUNTER — Ambulatory Visit (INDEPENDENT_AMBULATORY_CARE_PROVIDER_SITE_OTHER): Payer: Medicare HMO | Admitting: Family Medicine

## 2022-01-06 VITALS — BP 128/78 | HR 74 | Temp 98.1°F | Ht 63.0 in | Wt 258.2 lb

## 2022-01-06 DIAGNOSIS — J22 Unspecified acute lower respiratory infection: Secondary | ICD-10-CM

## 2022-01-06 DIAGNOSIS — R052 Subacute cough: Secondary | ICD-10-CM

## 2022-01-06 DIAGNOSIS — R1110 Vomiting, unspecified: Secondary | ICD-10-CM | POA: Diagnosis not present

## 2022-01-06 DIAGNOSIS — R062 Wheezing: Secondary | ICD-10-CM

## 2022-01-06 DIAGNOSIS — R059 Cough, unspecified: Secondary | ICD-10-CM | POA: Diagnosis not present

## 2022-01-06 MED ORDER — PREDNISONE 20 MG PO TABS: 40.0000 mg | ORAL_TABLET | Freq: Every day | ORAL | 0 refills | Status: DC

## 2022-01-06 MED ORDER — AZITHROMYCIN 250 MG PO TABS: ORAL_TABLET | ORAL | 0 refills | Status: AC

## 2022-01-06 NOTE — Progress Notes (Signed)
Subjective:  Patient ID: Renee Flores, female    DOB: [birth year]  Age: 56 y.o. MRN: [id number]  CC:  Chief Complaint  Patient presents with   Cough    Pt states she is wheezing and seen Dr Birdie Riddle 2 weeks ago and she finished the antibiotics took allergy meds and cough syrup and still no relief, when she coughs up it is yellow/green , pt states she has had this for 2.5 months and is getting worse with wheezing also    HPI Renee Flores presents for   Cough, wheezing Office visit reviewed from 12/24/2021 with Dr. Birdie Riddle.  Cough for about 2 months at that time.  Was taking Singulair at night but no antihistamines.  Intermittent wheeze.  Albuterol as needed.  Worse in the prior week.  Started on amoxicillin 875 mg twice daily and daily antihistamine with cough meds as needed.  Continue Singulair nightly, Flonase daily - has been consistent with these meds. Appt with cardiology 9/5 noted, added spirinolactone few days ago.   Finished antibiotic. No change in symptoms. Still coughing up yellow- green phlegm. No cough syncope, but coughing fits to point of emesis few times. Worse at night. Wheezing persists. Using albuterol every 6 hours. Promethazine at night.  No fever.  No sick contacts.  Hives with erythromycin, but has taken Zpak in past without issue.  Hx of prediabetes, not diabetes. Glucose 116 on 12/31/21.     History Patient Active Problem List   Diagnosis Date Noted   Elevated liver enzymes 12/31/2021   Fatty liver 12/31/2021   Morbidly obese ([place]) 03/01/2021   Dependence on CPAP ventilation 03/01/2021   Atypical chest pain 09/19/2020   Palpitations    Osteoporosis    Neuromuscular disorder ([place])    MS (multiple sclerosis) ([place])    Heart valve problem    Heart murmur    GERD (gastroesophageal reflux disease)    Factor 5 Leiden mutation, heterozygous ([place])    Complication of anesthesia    Bulging lumbar disc    Arthritis    Hypertension 04/13/2020   Lumbar  radiculopathy 08/12/2018   OSA on CPAP 03/25/2017   Hypersomnia with sleep apnea, unspecified 11/03/2013   Severe obesity (BMI >= 40) ([place]) 11/03/2013   Retrognathia 11/03/2013   Shortness of breath 06/03/2013   Right knee DJD 07/08/2012    Class: Chronic   Physical exam 03/23/2012   Factor V deficiency, congenital ([place]) 03/23/2012   Vitamin D deficiency    HYPERCHOLESTEROLEMIA 02/27/2010   Past Medical History:  Diagnosis Date   Allergy    Arthritis    Bulging lumbar disc    I1-4   Complication of anesthesia    anxiety attack after waking up from anesthesia   Factor 5 Leiden mutation, heterozygous ([place])    Factor V deficiency, congenital ([place]) 03/23/2012   GERD (gastroesophageal reflux disease)    Heart murmur    Heart palpitations    Heart valve problem    mild- moderate mitral valve leakage    High cholesterol    HYPERCHOLESTEROLEMIA 02/27/2010   Qualifier: Diagnosis of  By: [name], Denise     Hypersomnia with sleep apnea, unspecified 11/03/2013   Hypertension    Lumbar radiculopathy 08/12/2018   MS (multiple sclerosis) ([place])    questionable   Neuromuscular disorder ([place])    OSA on CPAP 03/25/2017   Osteopenia 04/2016   T score -1.9   Osteoporosis    Preop cardiovascular exam 03/23/2012   Retrognathia  11/03/2013   Right knee DJD 07/08/2012   Severe obesity (BMI >= 40) ([place]) 11/03/2013   Shortness of breath 06/03/2013   Sleep apnea    Vitamin D deficiency 2014   17   Past Surgical History:  Procedure Laterality Date   ABDOMINAL HYSTERECTOMY     ANKLE FUSION Left 04/25/2021   APPENDECTOMY     arthroscopic knee surgery     CESAREAN SECTION     x2   chloecystectomy     CHOLECYSTECTOMY     ESOPHAGEAL DILATION  04/09/2012   KNEE LIGAMENT RECONSTRUCTION     laser throat polyps     Left ankle reconstructed     NECK LESION BIOPSY     PATELLA FRACTURE SURGERY     PELVIC LAPAROSCOPY     LSO   REPLACEMENT TOTAL KNEE Left 2012   REPLACEMENT TOTAL KNEE Right 2014    TOE SURGERY     TONSILLECTOMY     TONSILLECTOMY  1979   TOTAL ABDOMINAL HYSTERECTOMY W/ BILATERAL SALPINGOOPHORECTOMY  2000   TOTAL KNEE ARTHROPLASTY Left 2012   TOTAL KNEE ARTHROPLASTY Right 07/08/2012   Procedure: TOTAL KNEE ARTHROPLASTY with revision components ;  Surgeon: Hessie Dibble, MD;  Location: [facility];  Service: Orthopedics;  Laterality: Right;  PATIENT HAS HX. OF FACTOR IV DISORFER   TUBAL LIGATION     Allergies  Allergen Reactions   Hydrochlorothiazide Nausea And Vomiting    Other reaction(s): Other (See Comments) Leg Cramps Leg Cramps  Other reaction(s): Other (See Comments) Leg Cramps   Morphine Other (See Comments), Rash and Nausea And Vomiting    Hallucinating  Other reaction(s): Other (See Comments) Hallucinating Hallucinating Hallucinating Hallucinating  Hallucinating  Hallucinating  Other reaction(s): Other (See Comments) Hallucinating Hallucinating   Vancomycin Nausea And Vomiting    Ringing in ears   Erythromycin Base Hives   Erythromycin Base Hives   Vancomycin Hcl Other (See Comments)    Ringing in ears   Doxycycline Nausea Only and Rash   Prior to Admission medications   Medication Sig Start Date End Date Taking? Authorizing Provider  albuterol (VENTOLIN HFA) 108 (90 Base) MCG/ACT inhaler Inhale 2 puffs into the lungs every 6 (six) hours as needed for wheezing or shortness of breath. 12/24/21  Yes [name], MD  aspirin EC 81 MG tablet Take 81 mg by mouth daily.   Yes [provider]  azelastine (ASTELIN) 0.1 % nasal spray Place 1 spray into both nostrils 2 (two) times daily. Use in each nostril as directed Patient taking differently: Place 1 spray into both nostrils daily as needed for rhinitis or allergies. Use in each nostril as directed 09/28/20  Yes Maximiano Coss, NP  Calcium Carb-Cholecalciferol (OYSTER SHELL CALCIUM W/D) 500-5 MG-MCG TABS TAKE 1 TABLET EVERY DAY Patient taking differently: Take 1 tablet by mouth daily.  11/13/21  Yes [name], MD  cetirizine (ZYRTEC) 10 MG tablet Take 10 mg by mouth in the morning.   Yes [provider]  cloNIDine (CATAPRES) 0.1 MG tablet TAKE 1 TABLET THREE TIMES DAILY Patient taking differently: Take 0.1 mg by mouth 2 (two) times daily. 09/18/21  Yes [name], MD  cyclobenzaprine (FLEXERIL) 5 MG tablet Take 1 tablet (5 mg total) by mouth 3 (three) times daily as needed for muscle spasms. 03/01/21  Yes Maximiano Coss, NP  fluticasone (FLONASE) 50 MCG/ACT nasal spray Place 2 sprays into both nostrils daily. Patient taking differently: Place 2 sprays into both nostrils daily  as needed for allergies. 08/06/20  Yes [name], MD  gabapentin (NEURONTIN) 600 MG tablet Take 1 tablet (600 mg total) by mouth at bedtime. 09/12/21  Yes [name], MD  ketotifen (ZADITOR) 0.025 % ophthalmic solution Place 2 drops into both eyes daily as needed (for irritation). 04/16/20  Yes [name], MD  losartan (COZAAR) 50 MG tablet Take 1 tablet (50 mg total) by mouth 2 (two) times daily. 06/19/21  Yes Park Liter, MD  meloxicam (MOBIC) 15 MG tablet TAKE 1 TABLET EVERY DAY Patient taking differently: Take 15 mg by mouth daily. 12/02/21  Yes [name], MD  metoprolol succinate (TOPROL-XL) 50 MG 24 hr tablet Take 1 tablet (50 mg total) by mouth daily. 06/24/21  Yes Park Liter, MD  montelukast (SINGULAIR) 10 MG tablet TAKE 1 TABLET AT BEDTIME Patient taking differently: Take 10 mg by mouth at bedtime. 12/02/21  Yes [name], MD  Multiple Vitamins-Minerals (ONE-A-DAY WOMENS 50+ ADVANTAGE PO) Take 1 tablet by mouth daily. Unknown strength   Yes [provider]  omeprazole (PRILOSEC) 40 MG capsule TAKE 1 CAPSULE EVERY DAY Patient taking differently: Take 40 mg by mouth daily. 10/24/21  Yes [name], MD  ondansetron (ZOFRAN) 4 MG tablet Take 1 tablet (4 mg total) by mouth every 8 (eight) hours as needed  for nausea or vomiting. 02/25/21  Yes Maximiano Coss, NP  promethazine-dextromethorphan (PROMETHAZINE-DM) 6.25-15 MG/5ML syrup Take 5 mLs by mouth 3 (three) times daily as needed for cough. 12/24/21  Yes [name], MD  simvastatin (ZOCOR) 40 MG tablet TAKE 1 TABLET AT BEDTIME 08/11/21  Yes [name], MD  spironolactone (ALDACTONE) 25 MG tablet Take 0.5 tablets (12.5 mg total) by mouth daily. 01/03/22  Yes Park Liter, MD  ofloxacin (FLOXIN OTIC) 0.3 % OTIC solution Place 5 drops into both ears daily. Patient not taking: Reported on 01/06/2022 02/25/21   Maximiano Coss, NP   Social History   Socioeconomic History   Marital status: Widowed    Spouse name: [name]   Number of children: 2   Years of education: 12   Highest education level: Not on file  Occupational History    Employer: [employer]  Tobacco Use   Smoking status: Never    Passive exposure: Never   Smokeless tobacco: Never  Vaping Use   Vaping Use: Never used  Substance and Sexual Activity   Alcohol use: Not Currently    Alcohol/week: 0.0 standard drinks of alcohol   Drug use: No   Sexual activity: Yes    Birth control/protection: Surgical    Comment: HYST-1st intercourse 56 yo-Fewer than 5 partners  Other Topics Concern   Not on file  Social History Narrative   [name]  and lives at home with her husband.   Patient has two children.   Patient works as a Mudlogger for [employer].   Patient drinks one cup of caffeine daily.   Patient is right-handed.   Patient has a high school education.   Social Determinants of Health   Financial Resource Strain: Low Risk  (09/26/2021)   Overall Financial Resource Strain (CARDIA)    Difficulty of Paying Living Expenses: Not hard at all  Food Insecurity: No Food Insecurity (09/26/2021)   Hunger Vital Sign    Worried About Running Out of Food in the Last Year: Never true    Ran Out of Food in the Last Year:  Never true   Transportation Needs: No Transportation Needs (09/26/2021)   PRAPARE - Hydrologist (Medical): No    Lack of Transportation (Non-Medical): No  Physical Activity: Insufficiently Active (09/26/2021)   Exercise Vital Sign    Days of Exercise per Week: 3 days    Minutes of Exercise per Session: 30 min  Stress: No Stress Concern Present (09/26/2021)   [place]    Feeling of Stress : Not at all  Social Connections: Moderately Integrated (09/26/2021)   Social Connection and Isolation Panel [NHANES]    Frequency of Communication with Friends and Family: Three times a week    Frequency of Social Gatherings with Friends and Family: Three times a week    Attends Religious Services: More than 4 times per year    Active Member of Clubs or Organizations: Yes    Attends Archivist Meetings: More than 4 times per year    Marital Status: Widowed  Intimate Partner Violence: Not At Risk (09/26/2021)   Humiliation, Afraid, Rape, and Kick questionnaire    Fear of Current or Ex-Partner: No    Emotionally Abused: No    Physically Abused: No    Sexually Abused: No    Review of Systems Per HPI.   Objective:   Vitals:   01/06/22 0834 01/06/22 0958  BP: 128/78   Pulse: 74   Temp: 98.1 F (36.7 C)   SpO2: 91% 93%  Weight: 258 lb 3.2 oz (117.1 kg)   Height: '5\' 3"'$  (1.6 m)     Physical Exam Vitals reviewed.  Constitutional:      General: She is not in acute distress.    Appearance: She is well-developed.  HENT:     Head: Normocephalic and atraumatic.     Right Ear: Hearing, tympanic membrane, ear canal and external ear normal.     Left Ear: Hearing, tympanic membrane, ear canal and external ear normal.     Nose: Nose normal.     Mouth/Throat:     Pharynx: No posterior oropharyngeal erythema.  Eyes:     Conjunctiva/sclera: Conjunctivae normal.     Pupils: Pupils are equal, round, and reactive to  light.  Cardiovascular:     Rate and Rhythm: Normal rate and regular rhythm.     Heart sounds: Normal heart sounds. No murmur heard. Pulmonary:     Effort: Pulmonary effort is normal. No respiratory distress.     Breath sounds: Wheezing (Few faint rhonchi, primarily end expiratory wheeze bilateral, bases.) and rhonchi present.  Musculoskeletal:     Right lower leg: No edema.     Left lower leg: No edema.  Skin:    General: Skin is warm and dry.     Findings: No rash.  Neurological:     Mental Status: She is alert and oriented to person, place, and time.  Psychiatric:        Mood and Affect: Mood normal.        Behavior: Behavior normal.        Assessment & Plan:  Renee Flores is a 56 y.o. female . Subacute cough - Plan: DG Chest 2 View, predniSONE (DELTASONE) 20 MG tablet, azithromycin (ZITHROMAX) 250 MG tablet Wheezing - Plan: DG Chest 2 View, predniSONE (DELTASONE) 20 MG tablet, azithromycin (ZITHROMAX) 250 MG tablet LRTI (lower respiratory tract infection) - Plan: DG Chest 2 View, predniSONE (DELTASONE) 20 MG tablet, azithromycin (ZITHROMAX) 250 MG tablet Post-tussive  emesis  -Persistent cough past few months with some worsening, worsening wheeze.  Expiratory wheeze noted throughout as above, but no respiratory distress, nontoxic appearance on exam.  With coughing fits, posttussive emesis differential includes atypical including pertussis.  Reactive airway/asthmatic bronchitis also possible however persistent discolored phlegm.  -Start prednisone 40 mg daily for next 5 days, potential side effects discussed.  Okay to continue albuterol if needed for wheeze  -Start azithromycin for atypicals, erythromycin allergy noted, clarified that she has taken azithromycin multiple times previously without difficulty.  -Continue allergy meds, other meds as above with RTC/ER precautions given.   Meds ordered this encounter  Medications   predniSONE (DELTASONE) 20 MG tablet    Sig:  Take 2 tablets (40 mg total) by mouth daily with breakfast.    Dispense:  10 tablet    Refill:  0   azithromycin (ZITHROMAX) 250 MG tablet    Sig: Take 2 tablets on day 1, then 1 tablet daily on days 2 through 5    Dispense:  6 tablet    Refill:  0   Patient Instructions  Please have x-ray obtained at the [facility] location:  [place] [facility]  Walk in 8:30-4:30 during weekdays, no appointment needed [address]  [place], [place] [address]  Start azithromycin for possible atypical infection or pertussis as we discussed.  Prednisone 2 pills/day for the next 5 days for the wheezing.  Okay to use albuterol as needed but I expect that need to decrease.  No other change in medications today.  If not improving through the week or into next week at the latest, be seen to discuss next step. Return to the clinic or go to the nearest emergency room if any of your symptoms worsen or new symptoms occur.     Signed,   Merri Ray, MD Guadalupe Guerra, [facility] 01/06/22 10:49 AM

## 2022-01-06 NOTE — Patient Instructions (Signed)
Please have x-ray obtained at the [facility] location:  [place] [facility]  Walk in 8:30-4:30 during weekdays, no appointment needed [place]enee [place].  [address], [place] [id number]  Start azithromycin for possible atypical infection or pertussis as we discussed.  Prednisone 2 pills/day for the next 5 days for the wheezing.  Okay to use albuterol as needed but I expect that need to decrease.  No other change in medications today.  If not improving through the week or into next week at the latest, be seen to discuss next step. Return to the clinic or go to the nearest emergency room if any of your symptoms worsen or new symptoms occur.

## 2022-01-08 ENCOUNTER — Ambulatory Visit: Payer: Medicare HMO | Admitting: Pharmacist

## 2022-01-08 DIAGNOSIS — I10 Essential (primary) hypertension: Secondary | ICD-10-CM

## 2022-01-08 DIAGNOSIS — M8580 Other specified disorders of bone density and structure, unspecified site: Secondary | ICD-10-CM

## 2022-01-08 NOTE — Patient Instructions (Addendum)
Visit Information   Goals Addressed             This Visit's Progress    Track and Manage My Blood Pressure-Hypertension   On track    Timeframe:  Long-Range Goal Priority:  High Start Date: 07/12/21                            Expected End Date:  01/12/22                     Follow Up Date 10/12/21    - check blood pressure weekly - choose a place to take my blood pressure (home, clinic or office, retail store) - write blood pressure results in a log or diary    Why is this important?   You won't feel high blood pressure, but it can still hurt your blood vessels.  High blood pressure can cause heart or kidney problems. It can also cause a stroke.  Making lifestyle changes like losing a little weight or eating less salt will help.  Checking your blood pressure at home and at different times of the day can help to control blood pressure.  If the doctor prescribes medicine remember to take it the way the doctor ordered.  Call the office if you cannot afford the medicine or if there are questions about it.     Notes:        Patient Care Plan: General Pharmacy (Adult)     Problem Identified: HTN, GERD, Osteopenia, HLD, Factor V Leiden   Priority: High  Onset Date: 07/12/2021     Long-Range Goal: Patient-Specific Goal   Start Date: 07/12/2021  Expected End Date: 01/12/2022  Recent Progress: On track  Priority: High  Note:   Current Barriers:  Elevated BP   Pharmacist Clinical Goal(s):  Patient will achieve control of BP as evidenced by monitoring through collaboration with PharmD and provider.   Interventions: 1:1 collaboration with [name], MD regarding development and update of comprehensive plan of care as evidenced by provider attestation and co-signature Inter-disciplinary care team collaboration (see longitudinal plan of care) Comprehensive medication review performed; medication list updated in electronic medical record  Hypertension (BP goal  <140/90) 01/08/22 -Uncontrolled -Current treatment: Clonidone 0.'1mg'$  three times per day Appropriate, Effective, Safe, Accessible Losartan '50mg'$  twice daily Appropriate, Effective, Safe, Accessible Metoprolol XL  '50mg'$  daily Appropriate, Effective, Safe, Accessible Spironolactone '25mg'$  daily Appropriate, Query effective, ,  -Medications previously tried: HCTZ -Current home readings:  -Denies hypotensive/hypertensive symptoms -Educated on BP goals and benefits of medications for prevention of heart attack, stroke and kidney damage; Daily salt intake goal < 2300 mg; Exercise goal of 150 minutes per week; Importance of home blood pressure monitoring; -Counseled to monitor BP at home a few times per week before and after medications, document, and provide log at future appointments -BP was at goal last office visit.  Unsure what readings have been at home.  She has FU upcoming with cardio for labs. -Needs to monitor at home, will have CMA check in 30 days to assess BP  Hyperlipidemia: (LDL goal < 100) -Controlled, not assessed today -Current treatment: Simvastatin '40mg'$  Appropriate, Effective, Safe, Accessible -Medications previously tried: none noted  -Educated on Cholesterol goals;  Benefits of statin for ASCVD risk reduction; Importance of limiting foods high in cholesterol; -Recommended to continue current medication LDL controlled at this time - no adverse effects with medication   Osteopenia (  Goal Maintain bone density, prevent fractures/falls) 01/08/22 -Controlled, due for updated DEXA -Last DEXA Scan: 2018   T-Score total hip: -1.9  T-Score lumbar spine: 0.4 -Patient is not a candidate for pharmacologic treatment -Current treatment  Oyster Shell Calcium + Vitamin D Appropriate, Effective, Safe, Accessible -Medications previously tried: none noted -Recommend [phone] units of vitamin D daily. Recommend 1200 mg of calcium daily from dietary and supplemental sources. Recommend  weight-bearing and muscle strengthening exercises for building and maintaining bone density.  -Patient expressed concern for DEXA due to family hx. She is due for updated DEXA, has FU with Dr. Birdie Riddle in November - will coordinate to get scheduled at that time. Last showed osteopenia.  Allergies (Goal: Minimize symptoms) -Controlled, not assessed today -Current treatment  Montelukast '10mg'$  hs Appropriate, Effective, Safe, Accessible Flonase 35mg prn Appropriate, Effective, Safe, Accessible -Medications previously tried: none noted -Having some allergies as the seasons change.  Reports Singulair as effective for symptoms.  She requests a refill on her Promethazine-DM syrup for a lingering cough she is having. -Recommended to continue current medication Will consult with PCP on appropriateness of Promethazine-DM refill.  Patient Goals/Self-Care Activities Patient will:  - take medications as prescribed as evidenced by patient report and record review check blood pressure a few times per week, document, and provide at future appointments  Follow Up Plan: The care management team will reach out to the patient again over the next 180 days.           The patient verbalized understanding of instructions, educational materials, and care plan provided today and DECLINED offer to receive copy of patient instructions, educational materials, and care plan.  Telephone follow up appointment with pharmacy team member scheduled for: 6 months  CEdythe Clarity RTrinity PharmD Clinical Pharmacist  [phone]

## 2022-01-10 DIAGNOSIS — I10 Essential (primary) hypertension: Secondary | ICD-10-CM | POA: Diagnosis not present

## 2022-01-11 LAB — BASIC METABOLIC PANEL
BUN/Creatinine Ratio: 15 (ref 9–23)
BUN: 12 mg/dL (ref 6–24)
CO2: 25 mmol/L (ref 20–29)
Calcium: 9.4 mg/dL (ref 8.7–10.2)
Chloride: 101 mmol/L (ref 96–106)
Creatinine, Ser: 0.79 mg/dL (ref 0.57–1.00)
Glucose: 114 mg/dL — ABNORMAL HIGH (ref 70–99)
Potassium: 3.8 mmol/L (ref 3.5–5.2)
Sodium: 141 mmol/L (ref 134–144)
eGFR: 88 mL/min/{1.73_m2} (ref >59)

## 2022-01-17 ENCOUNTER — Encounter: Payer: Self-pay | Admitting: Family Medicine

## 2022-01-17 ENCOUNTER — Telehealth: Payer: Self-pay

## 2022-01-17 ENCOUNTER — Ambulatory Visit (INDEPENDENT_AMBULATORY_CARE_PROVIDER_SITE_OTHER): Payer: Medicare HMO | Admitting: Family Medicine

## 2022-01-17 VITALS — BP 116/70 | HR 73 | Temp 98.9°F | Resp 16 | Ht 63.0 in | Wt 260.4 lb

## 2022-01-17 DIAGNOSIS — R062 Wheezing: Secondary | ICD-10-CM

## 2022-01-17 MED ORDER — FLUTICASONE PROPIONATE HFA 110 MCG/ACT IN AERO: 2.0000 | INHALATION_SPRAY | Freq: Two times a day (BID) | RESPIRATORY_TRACT | 12 refills | Status: DC

## 2022-01-17 MED ORDER — PREDNISONE 10 MG PO TABS: ORAL_TABLET | ORAL | 0 refills | Status: DC

## 2022-01-17 NOTE — Telephone Encounter (Signed)
-----   Message from Park Liter, MD sent at 01/17/2022  9:34 AM EDT ----- Chem-7 looks good, please start Aldactone 12.5 daily, if it was not done already Chem-7 need to be done within next week

## 2022-01-17 NOTE — Telephone Encounter (Signed)
Patient has been informed of results and recommendation on 01/03/22

## 2022-01-17 NOTE — Patient Instructions (Signed)
Follow up as needed or as scheduled START the Prednisone as directed- 3 pills at the same time x3 days, then 2 pills x3 days, and then 1 pill daily ADD the Flovent- 2 puffs twice daily until feeling better USE the Albuterol inhaler as needed Continue your allergy medications daily Call with any questions or concerns Hang in there!!

## 2022-01-17 NOTE — Progress Notes (Signed)
   Subjective:    Patient ID: Renee Flores, female    DOB: [birth year], 56 y.o.   MRN: [id number]  HPI Cough- pt was seen on 9/11 and treated w/ Zpack and Prednisone x5 days.  CXR was WNL.  Pt reports ongoing cough and wheezing.  Pt completed round of Amoxicillin prior to [place].  Pt is using albuterol inhaler but feels this worsens her cough.  No fever.  Pt reports she will have coughing spells where she will either gag or have trouble breathing.       Review of Systems For ROS see HPI     Objective:   Physical Exam Vitals reviewed.  Constitutional:      General: She is not in acute distress.    Appearance: Normal appearance. She is well-developed. She is obese. She is not ill-appearing.  HENT:     Head: Normocephalic and atraumatic.  Eyes:     Conjunctiva/sclera: Conjunctivae normal.     Pupils: Pupils are equal, round, and reactive to light.  Cardiovascular:     Rate and Rhythm: Normal rate and regular rhythm.     Heart sounds: Normal heart sounds. No murmur heard. Pulmonary:     Effort: Pulmonary effort is normal. No respiratory distress.     Breath sounds: Wheezing (end expiratory wheezes diffusely throughout) present.  Musculoskeletal:     Cervical back: Normal range of motion and neck supple.  Lymphadenopathy:     Cervical: No cervical adenopathy.  Skin:    General: Skin is warm and dry.  Neurological:     General: No focal deficit present.     Mental Status: She is alert and oriented to person, place, and time.           Assessment & Plan:   Wheezing- new.  Pt has had 2 rounds of abx over the last month and normal CXR.  No need for additional abx or repeat CXR.  Suspect airway inflammation or cough variant asthma.  Will do longer prednisone taper and add ICS.  Pt to continue Albuterol.  If no improvement, will need pulmonary referral.  Pt expressed understanding and is in agreement w/ plan.

## 2022-02-13 ENCOUNTER — Other Ambulatory Visit: Payer: Self-pay | Admitting: Family Medicine

## 2022-02-13 DIAGNOSIS — I10 Essential (primary) hypertension: Secondary | ICD-10-CM

## 2022-03-06 ENCOUNTER — Ambulatory Visit (INDEPENDENT_AMBULATORY_CARE_PROVIDER_SITE_OTHER): Payer: Medicare HMO | Admitting: Family Medicine

## 2022-03-06 ENCOUNTER — Encounter: Payer: Self-pay | Admitting: Family Medicine

## 2022-03-06 VITALS — BP 130/82 | HR 75 | Temp 99.0°F | Resp 18 | Ht 63.0 in | Wt 258.5 lb

## 2022-03-06 DIAGNOSIS — J329 Chronic sinusitis, unspecified: Secondary | ICD-10-CM

## 2022-03-06 DIAGNOSIS — B9689 Other specified bacterial agents as the cause of diseases classified elsewhere: Secondary | ICD-10-CM | POA: Diagnosis not present

## 2022-03-06 MED ORDER — AMOXICILLIN 875 MG PO TABS: 875.0000 mg | ORAL_TABLET | Freq: Two times a day (BID) | ORAL | 0 refills | Status: DC

## 2022-03-06 NOTE — Patient Instructions (Signed)
Follow up as needed or as scheduled START the Amoxicillin twice daily- w/ food- for your sinuses Drink LOTS of fluids Continue the Singulair and Zyrtec daily RESTART the nasal spray regularly Call with any questions or concerns Hang in there!!!

## 2022-03-06 NOTE — Progress Notes (Signed)
   Subjective:    Patient ID: Renee Flores, female    DOB: [birth year], 56 y.o.   MRN: [id number]  HPI Congestion- pt reports sxs started earlier this week.  + bilateral ear fullness and popping.  Pt states she has a 'croupy cough' productive of 'green chunks'.  + sinus pressure.  No tooth pain.  No fevers.  Taking Singulair, Zyrtec, and nasal sprays 'when I have to'.     Review of Systems For ROS see HPI     Objective:   Physical Exam Vitals reviewed.  Constitutional:      General: She is not in acute distress.    Appearance: Normal appearance. She is well-developed. She is obese.  HENT:     Head: Normocephalic and atraumatic.     Right Ear: Tympanic membrane normal.     Left Ear: Tympanic membrane normal.     Nose: Mucosal edema and congestion present. No rhinorrhea.     Right Sinus: Maxillary sinus tenderness present. No frontal sinus tenderness.     Left Sinus: Maxillary sinus tenderness present. No frontal sinus tenderness.     Mouth/Throat:     Pharynx: Uvula midline. Posterior oropharyngeal erythema present. No oropharyngeal exudate.  Eyes:     Conjunctiva/sclera: Conjunctivae normal.     Pupils: Pupils are equal, round, and reactive to light.  Cardiovascular:     Rate and Rhythm: Normal rate and regular rhythm.     Heart sounds: Normal heart sounds.  Pulmonary:     Effort: Pulmonary effort is normal. No respiratory distress.     Breath sounds: Normal breath sounds. No wheezing.  Musculoskeletal:     Cervical back: Normal range of motion and neck supple.  Lymphadenopathy:     Cervical: No cervical adenopathy.  Skin:    General: Skin is warm and dry.  Neurological:     General: No focal deficit present.     Mental Status: She is alert and oriented to person, place, and time.     Cranial Nerves: No cranial nerve deficit.     Motor: No weakness.     Coordination: Coordination normal.  Psychiatric:        Mood and Affect: Mood normal.        Behavior: Behavior  normal.        Thought Content: Thought content normal.           Assessment & Plan:  Bacterial sinusitis- new.  Pt has hx of similar.  Star Amoxicillin BID.  Reviewed supportive care and red flags that should prompt return.  Pt expressed understanding and is in agreement w/ plan.

## 2022-03-12 ENCOUNTER — Other Ambulatory Visit: Payer: Self-pay | Admitting: Family Medicine

## 2022-03-12 DIAGNOSIS — R52 Pain, unspecified: Secondary | ICD-10-CM

## 2022-03-12 DIAGNOSIS — M25571 Pain in right ankle and joints of right foot: Secondary | ICD-10-CM | POA: Diagnosis not present

## 2022-03-13 ENCOUNTER — Telehealth: Payer: Self-pay

## 2022-03-13 MED ORDER — DOXYCYCLINE HYCLATE 100 MG PO TABS: 100.0000 mg | ORAL_TABLET | Freq: Two times a day (BID) | ORAL | 0 refills | Status: DC

## 2022-03-13 NOTE — Addendum Note (Signed)
Addended by: [name] on: 03/13/2022 12:25 PM   Modules accepted: Orders

## 2022-03-13 NOTE — Telephone Encounter (Signed)
Patient has been informed.

## 2022-03-13 NOTE — Telephone Encounter (Signed)
Spoke to the pt and she states she only gets the nausea with Doxycycline not a rash .

## 2022-03-13 NOTE — Telephone Encounter (Signed)
I sent in a 10 day course of Doxycycline since she confirmed she only has nausea w/ the medication.  She should take this w/ food to minimize nausea.  If no improvement w/ Doxycycline, needs repeat visit for evaluation/assessment

## 2022-03-13 NOTE — Telephone Encounter (Signed)
Patient called in stating her coughing has worsened and feels congested in her chest notes has been taking abx since Friday and still not better   Please advise

## 2022-03-13 NOTE — Telephone Encounter (Signed)
It sounds like she needs additional antibiotics.  Unfortunately she is allergic to multiple meds.  Her chart is a bit confusing b/c it says she has 'nausea only' w/ Doxycycline but then also lists a rash.  Could you please clarify with patient what happens if she takes Doxy?  I'm trying to use the most effective (both infection and cost) medication but obviously don't want her to have side effects/allergies

## 2022-03-17 ENCOUNTER — Encounter: Payer: Self-pay | Admitting: Family Medicine

## 2022-03-17 ENCOUNTER — Ambulatory Visit (INDEPENDENT_AMBULATORY_CARE_PROVIDER_SITE_OTHER): Payer: Medicare HMO | Admitting: Family Medicine

## 2022-03-17 VITALS — BP 126/84 | HR 73 | Temp 98.4°F | Resp 17 | Ht 63.0 in | Wt 260.2 lb

## 2022-03-17 DIAGNOSIS — I10 Essential (primary) hypertension: Secondary | ICD-10-CM | POA: Diagnosis not present

## 2022-03-17 DIAGNOSIS — E7800 Pure hypercholesterolemia, unspecified: Secondary | ICD-10-CM

## 2022-03-17 LAB — CBC WITH DIFFERENTIAL/PLATELET
Basophils Absolute: 0.1 10*3/uL (ref 0.0–0.1)
Basophils Relative: 1 % (ref 0.0–3.0)
Eosinophils Absolute: 0.9 10*3/uL — ABNORMAL HIGH (ref 0.0–0.7)
Eosinophils Relative: 10.4 % — ABNORMAL HIGH (ref 0.0–5.0)
HCT: 39.2 % (ref 36.0–46.0)
Hemoglobin: 12.7 g/dL (ref 12.0–15.0)
Lymphocytes Relative: 36.6 % (ref 12.0–46.0)
Lymphs Abs: 3.1 10*3/uL (ref 0.7–4.0)
MCHC: 32.3 g/dL (ref 30.0–36.0)
MCV: 85.8 fl (ref 78.0–100.0)
Monocytes Absolute: 0.6 10*3/uL (ref 0.1–1.0)
Monocytes Relative: 6.5 % (ref 3.0–12.0)
Neutro Abs: 3.9 10*3/uL (ref 1.4–7.7)
Neutrophils Relative %: 45.5 % (ref 43.0–77.0)
Platelets: 284 10*3/uL (ref 150.0–400.0)
RBC: 4.57 Mil/uL (ref 3.87–5.11)
RDW: 13.8 % (ref 11.5–15.5)
WBC: 8.6 10*3/uL (ref 4.0–10.5)

## 2022-03-17 LAB — HEPATIC FUNCTION PANEL
ALT: 49 U/L — ABNORMAL HIGH (ref 0–35)
AST: 36 U/L (ref 0–37)
Albumin: 4.3 g/dL (ref 3.5–5.2)
Alkaline Phosphatase: 101 U/L (ref 39–117)
Bilirubin, Direct: 0.1 mg/dL (ref 0.0–0.3)
Total Bilirubin: 0.4 mg/dL (ref 0.2–1.2)
Total Protein: 7.5 g/dL (ref 6.0–8.3)

## 2022-03-17 LAB — LIPID PANEL
Cholesterol: 158 mg/dL (ref 0–200)
HDL: 48.8 mg/dL (ref >39.00)
LDL Cholesterol: 72 mg/dL (ref 0–99)
NonHDL: 108.76
Total CHOL/HDL Ratio: 3
Triglycerides: 186 mg/dL — ABNORMAL HIGH (ref 0.0–149.0)
VLDL: 37.2 mg/dL (ref 0.0–40.0)

## 2022-03-17 LAB — HEMOGLOBIN A1C: Hgb A1c MFr Bld: 6.4 % (ref 4.6–6.5)

## 2022-03-17 LAB — BASIC METABOLIC PANEL
BUN: 11 mg/dL (ref 6–23)
CO2: 29 mEq/L (ref 19–32)
Calcium: 9.6 mg/dL (ref 8.4–10.5)
Chloride: 102 mEq/L (ref 96–112)
Creatinine, Ser: 0.73 mg/dL (ref 0.40–1.20)
GFR: 92.63 mL/min (ref >60.00)
Glucose, Bld: 109 mg/dL — ABNORMAL HIGH (ref 70–99)
Potassium: 4.1 mEq/L (ref 3.5–5.1)
Sodium: 138 mEq/L (ref 135–145)

## 2022-03-17 LAB — TSH: TSH: 2.16 u[IU]/mL (ref 0.35–5.50)

## 2022-03-17 NOTE — Assessment & Plan Note (Signed)
Ongoing issue for pt.  She is down 6 lbs since May.  She is going to the gym 1-2x/week.  I encouraged her to try and go more regularly or add walking on the days she does not go to the gym.  Will continue to follow.

## 2022-03-17 NOTE — Progress Notes (Signed)
   Subjective:    Patient ID: DONYAE Flores, female    DOB: [birth year], 56 y.o.   MRN: [id number]  HPI HTN- chronic problem, on Clonidine 0.'1mg'$  TID, Losartan '50mg'$  daily, metoprolol XL '50mg'$  daily, Spironolactone 12.'5mg'$  daily.  No CP, SOB, HA's, visual changes, edema  Hyperlipidemia- chronic problem, on Simvastatin '40mg'$  daily.  No abd pain, N/V.  Obesity- ongoing issue for pt.  BMI 46.1  Pt is down 6 lbs since May.  Pt is going to the gym 1-2x/week.  Plans to start walking more regularly   Review of Systems For ROS see HPI     Objective:   Physical Exam Vitals reviewed.  Constitutional:      General: She is not in acute distress.    Appearance: Normal appearance. She is well-developed. She is obese. She is not ill-appearing.  HENT:     Head: Normocephalic and atraumatic.  Eyes:     Conjunctiva/sclera: Conjunctivae normal.     Pupils: Pupils are equal, round, and reactive to light.  Neck:     Thyroid: No thyromegaly.  Cardiovascular:     Rate and Rhythm: Normal rate and regular rhythm.     Pulses: Normal pulses.     Heart sounds: Normal heart sounds. No murmur heard. Pulmonary:     Effort: Pulmonary effort is normal. No respiratory distress.     Breath sounds: Normal breath sounds.  Abdominal:     General: There is no distension.     Palpations: Abdomen is soft.     Tenderness: There is no abdominal tenderness.  Musculoskeletal:     Cervical back: Normal range of motion and neck supple.     Right lower leg: No edema.     Left lower leg: No edema.  Lymphadenopathy:     Cervical: No cervical adenopathy.  Skin:    General: Skin is warm and dry.  Neurological:     General: No focal deficit present.     Mental Status: She is alert and oriented to person, place, and time.  Psychiatric:        Behavior: Behavior normal.           Assessment & Plan:

## 2022-03-17 NOTE — Assessment & Plan Note (Signed)
Chronic problem.  Currently on Simvastatin '40mg'$  daily w/o difficulty.  Check labs.  Adjust meds prn

## 2022-03-17 NOTE — Patient Instructions (Signed)
Schedule your complete physical in 6 months We'll notify you of your lab results and make any changes if needed Continue to work on healthy diet and regular exercise- you can do it! Call with any questions or concerns Stay Safe!  Stay Healthy! Happy Holidays!! 

## 2022-03-17 NOTE — Assessment & Plan Note (Signed)
Chronic problem.  Currently adequate control on Clonidine 0.'1mg'$  TID, Losartan '50mg'$  daily, Metoprolol XL '50mg'$  daily, and Spironolactone 12.'5mg'$  daily.  Currently asymptomatic.  Check labs due to ARB and diuretic.  No anticipated med changes.  Will follow.

## 2022-03-18 NOTE — Telephone Encounter (Signed)
Informed pt of lab results  

## 2022-03-18 NOTE — Telephone Encounter (Signed)
-----   Message from [name], MD sent at 03/18/2022  7:28 AM EST ----- Labs are stable and look good.  A1C is right on the cusp of diabetes and will improve w/ low carb/low sugar diet and regular exercise.  We will continue to follow closely at future visits.

## 2022-03-19 ENCOUNTER — Other Ambulatory Visit: Payer: Self-pay | Admitting: Family Medicine

## 2022-03-19 DIAGNOSIS — K219 Gastro-esophageal reflux disease without esophagitis: Secondary | ICD-10-CM

## 2022-03-31 DIAGNOSIS — H5201 Hypermetropia, right eye: Secondary | ICD-10-CM | POA: Diagnosis not present

## 2022-04-09 ENCOUNTER — Other Ambulatory Visit: Payer: Self-pay | Admitting: Cardiology

## 2022-05-08 ENCOUNTER — Ambulatory Visit (INDEPENDENT_AMBULATORY_CARE_PROVIDER_SITE_OTHER): Payer: PPO | Admitting: Family Medicine

## 2022-05-08 ENCOUNTER — Encounter: Payer: Self-pay | Admitting: Family Medicine

## 2022-05-08 VITALS — BP 120/70 | HR 77 | Temp 98.4°F | Resp 18 | Ht 63.0 in | Wt 254.1 lb

## 2022-05-08 DIAGNOSIS — J329 Chronic sinusitis, unspecified: Secondary | ICD-10-CM | POA: Diagnosis not present

## 2022-05-08 DIAGNOSIS — B9689 Other specified bacterial agents as the cause of diseases classified elsewhere: Secondary | ICD-10-CM

## 2022-05-08 DIAGNOSIS — J069 Acute upper respiratory infection, unspecified: Secondary | ICD-10-CM

## 2022-05-08 MED ORDER — AMOXICILLIN-POT CLAVULANATE 875-125 MG PO TABS: 1.0000 | ORAL_TABLET | Freq: Two times a day (BID) | ORAL | 0 refills | Status: DC

## 2022-05-08 NOTE — Progress Notes (Signed)
   Subjective:    Patient ID: Renee Flores, female    DOB: Apr 16, 1966, 57 y.o.   MRN: [id number]  HPI URI- pt went to the beach last weekend and came home w/ sinus pressure, sore throat, chest congestion, productive cough (productive of green sputum).  + HA, R ear pain.  No fevers.  Home COVID test was negative.  No known sick contacts.  + wheezing.   Review of Systems For ROS see HPI     Objective:   Physical Exam Vitals reviewed.  Constitutional:      General: She is not in acute distress.    Appearance: Normal appearance. She is obese. She is not ill-appearing.  HENT:     Head: Normocephalic and atraumatic.     Right Ear: Tympanic membrane and ear canal normal.     Left Ear: Tympanic membrane and ear canal normal.     Nose: Congestion present. No rhinorrhea.     Comments: No TTP over frontal sinuses + TTP over R maxillary sinus w/ swelling present    Mouth/Throat:     Mouth: Mucous membranes are moist.     Pharynx: No oropharyngeal exudate or posterior oropharyngeal erythema.  Eyes:     Extraocular Movements: Extraocular movements intact.     Conjunctiva/sclera: Conjunctivae normal.     Pupils: Pupils are equal, round, and reactive to light.  Cardiovascular:     Rate and Rhythm: Normal rate and regular rhythm.     Pulses: Normal pulses.  Pulmonary:     Effort: Pulmonary effort is normal. No respiratory distress.     Breath sounds: Wheezing (diffuse expiratory wheezes) present. No rhonchi.  Musculoskeletal:     Cervical back: Normal range of motion.  Lymphadenopathy:     Cervical: No cervical adenopathy.  Skin:    General: Skin is warm and dry.  Neurological:     General: No focal deficit present.     Mental Status: She is alert and oriented to person, place, and time.  Psychiatric:        Mood and Affect: Mood normal.        Behavior: Behavior normal.        Thought Content: Thought content normal.           Assessment & Plan:  Bacterial sinusitis- new.   Pt w/ TTP over R maxillary sinus and swelling is present.  Start Augmentin to tx infxn.  Reviewed supportive care and red flags that should prompt return.  Pt expressed understanding and is in agreement w/ plan.   Recurrent URI- new.  Pt has had multiple URIs and seems to develop wheezing or SOB w/ each one.  She has albuterol and Flovent to use prn but has never had a formal pulmonary evaluation.  Referral placed.

## 2022-05-08 NOTE — Patient Instructions (Addendum)
Follow up as needed or as scheduled START the Augmentin twice daily- take w/ food Continue Robitussin, Delsym, or Mucinex DM for cough and congestion USE your Albuterol inhaler as needed for wheezing or shortness of breath VOCAL REST!!! Drink LOTS of fluids Call with any questions or concerns Hang in there!!!

## 2022-05-27 ENCOUNTER — Encounter: Payer: Self-pay | Admitting: Family Medicine

## 2022-05-27 ENCOUNTER — Telehealth: Payer: Self-pay | Admitting: Family Medicine

## 2022-05-27 ENCOUNTER — Ambulatory Visit (INDEPENDENT_AMBULATORY_CARE_PROVIDER_SITE_OTHER): Payer: PPO | Admitting: Family Medicine

## 2022-05-27 VITALS — BP 132/84 | HR 86 | Temp 98.9°F | Resp 17 | Ht 63.0 in | Wt 256.0 lb

## 2022-05-27 DIAGNOSIS — R052 Subacute cough: Secondary | ICD-10-CM

## 2022-05-27 DIAGNOSIS — R062 Wheezing: Secondary | ICD-10-CM | POA: Diagnosis not present

## 2022-05-27 MED ORDER — PREDNISONE 10 MG PO TABS: ORAL_TABLET | ORAL | 0 refills | Status: DC

## 2022-05-27 MED ORDER — ARNUITY ELLIPTA 100 MCG/ACT IN AEPB: 1.0000 | INHALATION_SPRAY | Freq: Every day | RESPIRATORY_TRACT | 1 refills | Status: DC

## 2022-05-27 NOTE — Telephone Encounter (Signed)
error 

## 2022-05-27 NOTE — Patient Instructions (Addendum)
Follow up as needed or as scheduled START the Prednisone as directed- take w/ food USE the Arnuity Ellipta 1 puff daily as a controller medication (if too expensive, don't pick it up) CONTINUE the Albuterol as needed for wheezing or shortness of breath Call with any questions or concerns Hang in there!

## 2022-05-27 NOTE — Progress Notes (Signed)
   Subjective:    Patient ID: Renee Flores, female    DOB: [birth year], 57 y.o.   MRN: [id number]  HPI Cough/wheezing- pt was seen on 1/11 and tx'd w/ Augmentin for sinusitis.  Sinus sxs somewhat improved but cough and wheezing persist.  She has albuterol to use as needed but not using Flovent due to cost.  Has appt w/ pulmonary next week w/ Dr Renee Flores.  Pt reports 'cough is so deep it's like I'm throwing up the phlegm'.  No relief w/ Albuterol or Mucinex DM.  + SOB.  + wheezing.  No fevers.   Review of Systems For ROS see HPI     Objective:   Physical Exam Vitals reviewed.  Constitutional:      General: She is not in acute distress.    Appearance: Normal appearance. She is well-developed. She is obese. She is not ill-appearing.  HENT:     Head: Normocephalic and atraumatic.     Right Ear: Tympanic membrane and ear canal normal.     Left Ear: Tympanic membrane and ear canal normal.     Nose: Congestion present.     Mouth/Throat:     Mouth: Mucous membranes are moist.     Pharynx: No oropharyngeal exudate or posterior oropharyngeal erythema.  Eyes:     Conjunctiva/sclera: Conjunctivae normal.     Pupils: Pupils are equal, round, and reactive to light.  Cardiovascular:     Rate and Rhythm: Normal rate and regular rhythm.     Heart sounds: Normal heart sounds. No murmur heard. Pulmonary:     Effort: Pulmonary effort is normal. No respiratory distress.     Breath sounds: Wheezing present.     Comments: + hacking cough Musculoskeletal:     Cervical back: Normal range of motion and neck supple.  Lymphadenopathy:     Cervical: No cervical adenopathy.  Skin:    General: Skin is warm and dry.  Neurological:     General: No focal deficit present.     Mental Status: She is alert and oriented to person, place, and time.  Psychiatric:        Mood and Affect: Mood normal.        Behavior: Behavior normal.        Thought Content: Thought content normal.           Assessment  & Plan:  Subacute cough w/ wheezing- new.  Suspect reactive airway.  Pt is no longer ill appearing or feeling sick.  Start Prednisone taper and switch to Arnuity Ellipta as Flovent is too expensive.  Continue Albuterol prn.  Follow up w/ Pulmonary as scheduled.  Reviewed supportive care and red flags that should prompt return.  Pt expressed understanding and is in agreement w/ plan.

## 2022-05-28 DIAGNOSIS — K760 Fatty (change of) liver, not elsewhere classified: Secondary | ICD-10-CM | POA: Diagnosis not present

## 2022-06-04 ENCOUNTER — Ambulatory Visit (INDEPENDENT_AMBULATORY_CARE_PROVIDER_SITE_OTHER): Payer: PPO | Admitting: Pulmonary Disease

## 2022-06-04 ENCOUNTER — Encounter: Payer: Self-pay | Admitting: Pulmonary Disease

## 2022-06-04 VITALS — BP 140/80 | HR 73 | Temp 98.2°F | Ht 63.0 in | Wt 256.6 lb

## 2022-06-04 DIAGNOSIS — D721 Eosinophilia, unspecified: Secondary | ICD-10-CM | POA: Diagnosis not present

## 2022-06-04 DIAGNOSIS — J309 Allergic rhinitis, unspecified: Secondary | ICD-10-CM | POA: Diagnosis not present

## 2022-06-04 DIAGNOSIS — R0600 Dyspnea, unspecified: Secondary | ICD-10-CM

## 2022-06-04 DIAGNOSIS — R062 Wheezing: Secondary | ICD-10-CM | POA: Diagnosis not present

## 2022-06-04 DIAGNOSIS — R9389 Abnormal findings on diagnostic imaging of other specified body structures: Secondary | ICD-10-CM | POA: Diagnosis not present

## 2022-06-04 DIAGNOSIS — J069 Acute upper respiratory infection, unspecified: Secondary | ICD-10-CM | POA: Diagnosis not present

## 2022-06-04 MED ORDER — FLUTICASONE FUROATE-VILANTEROL 200-25 MCG/ACT IN AEPB: 1.0000 | INHALATION_SPRAY | Freq: Every day | RESPIRATORY_TRACT | Status: DC

## 2022-06-04 NOTE — Patient Instructions (Signed)
Shortness of breath with wheezing: Stop Arnuity Start Breo 200, 1 puff daily no matter how you feel Lung function testing Check serum RAST profile to look for evidence of underlying allergy We may need to refer you to an allergist at some point Use albuterol as needed for chest tightness wheezing or shortness of breath  Abnormal chest x-ray with chronic bronchitis symptoms: High-resolution CT scan of the chest to look for bronchiectasis  We will see you back in 3 to 4 weeks to go over these results, sooner if needed

## 2022-06-04 NOTE — Progress Notes (Signed)
Synopsis: Referred in 05/2022 for Recurrent URI, has a history of allergic rhinitis and elevated serum eosinophils.  Subjective:   PATIENT ID: Renee Flores GENDER: female DOB: [birth year], MRN: [id number]   HPI  Chief Complaint  Patient presents with   Consult    Renee Flores has been struggling with a deep cough for months > says she can feel wheezing and mucus in her chest > she's had it for months, she thinks perhaps over a year > she has had allergy issues for most of her adult life > pollen season is worse,  > typically fall and spring are worse for her in terms of respiratory symptoms > she takes montelukast and an antihistamine > she says she coughs up yellow to green chunks of mucus > she'll cough deeply, gags, vomits from time to time  She'll get shortness of breath and wheezing: > last week she said if felt like a weight bearing down on her lungs > she's tried Advair, flovent over the years > she's now on Arnuity, has been on it for 1 weeks, it's helped with the wheezing > she completed prednisone this morning > she's using albuterol about 2x/day > she'll wake up coughing at night  She developed mucus build up again.  Childhood normal, no respiratory infections or asthma Lived with a cigarettes smoker 2013-2021  She has never smoked, currently on disability.  Previously worked for a [employer], broadcasting/film/video.  She used to work in a Horticulturist, commercial for years  Living environment: > has lived in a house with asbestos shingle > the last house she lived in had black mold in it (2014-2021)   OSA > uses CPAP nightly > sleeps with TV on in the bedroom at night to drown out her wheezing  Record review: 04/2022 visit with primary care (Dr. Birdie Riddle) reviewed where she was treated with arnuity and prednisont for URI.  Eosinophils 900k cell/Ul  Past Medical History:  Diagnosis Date   Allergy    Arthritis    Bulging lumbar disc    R5-1    Complication of anesthesia    anxiety attack after waking up from anesthesia   Factor 5 Leiden mutation, heterozygous ([place])    Factor V deficiency, congenital ([place]) 03/23/2012   GERD (gastroesophageal reflux disease)    Heart murmur    Heart palpitations    Heart valve problem    mild- moderate mitral valve leakage    High cholesterol    HYPERCHOLESTEROLEMIA 02/27/2010   Qualifier: Diagnosis of  By: [name], Denise     Hypersomnia with sleep apnea, unspecified 11/03/2013   Hypertension    Lumbar radiculopathy 08/12/2018   MS (multiple sclerosis) ([place])    questionable   Neuromuscular disorder (HCC)    OSA on CPAP 03/25/2017   Osteopenia 04/2016   T score -1.9   Osteoporosis    Preop cardiovascular exam 03/23/2012   Retrognathia 11/03/2013   Right knee DJD 07/08/2012   Severe obesity (BMI >= 40) ([place]) 11/03/2013   Shortness of breath 06/03/2013   Sleep apnea    Vitamin D deficiency 2014   17     Family History  Problem Relation Age of Onset   Heart disease Father    Hypertension Father    Hyperlipidemia Father    Cancer Father    Other Father        Respiratory problems/Factor 5 disorder/1 bypass, 2 valve replacement (mitral and aortic)  Hypertension Mother    Diabetes Mother    Hyperlipidemia Mother    Hypertension Sister    Colon cancer Paternal Aunt    Breast cancer Paternal Aunt 28   Heart disease Maternal Grandfather    Lymphoma Maternal Grandfather    Heart failure Maternal Grandfather    Colon cancer Paternal Grandmother    Hyperlipidemia Brother    Hypertension Brother    Hypertension Brother    Colon cancer Paternal Aunt    Breast cancer Paternal Aunt 79   Stroke Paternal Grandfather      Social History   Socioeconomic History   Marital status: Widowed    Spouse name: [name]   Number of children: 2   Years of education: 12   Highest education level: Not on file  Occupational History    Employer: [employer]  Tobacco Use   Smoking status:  Never    Passive exposure: Never   Smokeless tobacco: Never  Vaping Use   Vaping Use: Never used  Substance and Sexual Activity   Alcohol use: Not Currently    Alcohol/week: 0.0 standard drinks of alcohol   Drug use: No   Sexual activity: Yes    Birth control/protection: Surgical    Comment: HYST-1st intercourse 57 yo-Fewer than 5 partners  Other Topics Concern   Not on file  Social History Narrative   [name]  and lives at home with her husband.   Patient has two children.   Patient works as a Mudlogger for [employer].   Patient drinks one cup of caffeine daily.   Patient is right-handed.   Patient has a high school education.   Social Determinants of Health   Financial Resource Strain: Low Risk  (09/26/2021)   Overall Financial Resource Strain (CARDIA)    Difficulty of Paying Living Expenses: Not hard at all  Food Insecurity: No Food Insecurity (09/26/2021)   Hunger Vital Sign    Worried About Running Out of Food in the Last Year: Never true    Ran Out of Food in the Last Year: Never true  Transportation Needs: No Transportation Needs (09/26/2021)   PRAPARE - Hydrologist (Medical): No    Lack of Transportation (Non-Medical): No  Physical Activity: Insufficiently Active (09/26/2021)   Exercise Vital Sign    Days of Exercise per Week: 3 days    Minutes of Exercise per Session: 30 min  Stress: No Stress Concern Present (09/26/2021)   [place]    Feeling of Stress : Not at all  Social Connections: Moderately Integrated (09/26/2021)   Social Connection and Isolation Panel [NHANES]    Frequency of Communication with Friends and Family: Three times a week    Frequency of Social Gatherings with Friends and Family: Three times a week    Attends Religious Services: More than 4 times per year    Active Member of Clubs or Organizations: Yes    Attends Theatre manager Meetings: More than 4 times per year    Marital Status: Widowed  Intimate Partner Violence: Not At Risk (09/26/2021)   Humiliation, Afraid, Rape, and Kick questionnaire    Fear of Current or Ex-Partner: No    Emotionally Abused: No    Physically Abused: No    Sexually Abused: No     Allergies  Allergen Reactions   Hydrochlorothiazide Nausea And Vomiting    Other reaction(s): Other (See Comments)  Leg Cramps Leg Cramps  Other reaction(s): Other (See Comments) Leg Cramps   Morphine Other (See Comments), Rash and Nausea And Vomiting    Hallucinating  Other reaction(s): Other (See Comments) Hallucinating Hallucinating Hallucinating Hallucinating  Hallucinating  Hallucinating  Other reaction(s): Other (See Comments) Hallucinating Hallucinating   Vancomycin Nausea And Vomiting    Ringing in ears   Erythromycin Base Hives   Erythromycin Base Hives   Vancomycin Hcl Other (See Comments)    Ringing in ears   Doxycycline Nausea Only and Rash     Outpatient Medications Prior to Visit  Medication Sig Dispense Refill   albuterol (VENTOLIN HFA) 108 (90 Base) MCG/ACT inhaler Inhale 2 puffs into the lungs every 6 (six) hours as needed for wheezing or shortness of breath. 8 g 2   aspirin EC 81 MG tablet Take 81 mg by mouth daily.     azelastine (ASTELIN) 0.1 % nasal spray Place 1 spray into both nostrils 2 (two) times daily. Use in each nostril as directed (Patient taking differently: Place 1 spray into both nostrils daily as needed for rhinitis or allergies. Use in each nostril as directed) 30 mL 12   Calcium Carb-Cholecalciferol (OYSTER SHELL CALCIUM W/D) 500-5 MG-MCG TABS TAKE 1 TABLET EVERY DAY 90 tablet 10   cloNIDine (CATAPRES) 0.1 MG tablet TAKE 1 TABLET THREE TIMES DAILY 270 tablet 10   cyclobenzaprine (FLEXERIL) 5 MG tablet Take 1 tablet (5 mg total) by mouth 3 (three) times daily as needed for muscle spasms. 30 tablet 1   fluticasone (FLONASE) 50 MCG/ACT nasal spray  Place 2 sprays into both nostrils daily. (Patient taking differently: Place 2 sprays into both nostrils daily as needed for allergies.) 16 g 6   Fluticasone Furoate (ARNUITY ELLIPTA) 100 MCG/ACT AEPB Inhale 1 Inhalation into the lungs daily. 30 each 1   gabapentin (NEURONTIN) 600 MG tablet TAKE 1 TABLET AT BEDTIME 90 tablet 10   ketotifen (ZADITOR) 0.025 % ophthalmic solution Place 2 drops into both eyes daily as needed (for irritation). 5 mL 0   losartan (COZAAR) 50 MG tablet Take 1 tablet (50 mg total) by mouth 2 (two) times daily. 180 tablet 2   meloxicam (MOBIC) 15 MG tablet TAKE 1 TABLET EVERY DAY (Patient taking differently: Take 15 mg by mouth daily.) 90 tablet 0   metoprolol succinate (TOPROL-XL) 50 MG 24 hr tablet Take 1 tablet (50 mg total) by mouth daily. 90 tablet 3   montelukast (SINGULAIR) 10 MG tablet TAKE 1 TABLET AT BEDTIME (Patient taking differently: Take 10 mg by mouth at bedtime.) 90 tablet 0   Multiple Vitamins-Minerals (ONE-A-DAY WOMENS 50+ ADVANTAGE PO) Take 1 tablet by mouth daily. Unknown strength     ofloxacin (FLOXIN OTIC) 0.3 % OTIC solution Place 5 drops into both ears daily. 5 mL 0   omeprazole (PRILOSEC) 40 MG capsule TAKE 1 CAPSULE EVERY DAY 90 capsule 3   ondansetron (ZOFRAN) 4 MG tablet Take 1 tablet (4 mg total) by mouth every 8 (eight) hours as needed for nausea or vomiting. 20 tablet 0   promethazine-dextromethorphan (PROMETHAZINE-DM) 6.25-15 MG/5ML syrup Take 5 mLs by mouth 3 (three) times daily as needed for cough. 118 mL 0   simvastatin (ZOCOR) 40 MG tablet TAKE 1 TABLET AT BEDTIME 90 tablet 0   spironolactone (ALDACTONE) 25 MG tablet Take 0.5 tablets (12.5 mg total) by mouth daily. 45 tablet 3   predniSONE (DELTASONE) 10 MG tablet 3 tabs x3 days and then 2 tabs x3 days and  then 1 tab x3 days.  Take w/ food. (Patient not taking: Reported on 06/04/2022) 18 tablet 0   No facility-administered medications prior to visit.    Review of Systems  Constitutional:   Negative for chills, fever, malaise/fatigue and weight loss.  HENT:  Negative for congestion, nosebleeds, sinus pain and sore throat.   Eyes:  Negative for photophobia, pain and discharge.  Respiratory:  Positive for cough, sputum production, shortness of breath and wheezing. Negative for hemoptysis.   Cardiovascular:  Negative for chest pain, palpitations, orthopnea and leg swelling.  Gastrointestinal:  Negative for abdominal pain, constipation, diarrhea, nausea and vomiting.  Genitourinary:  Negative for dysuria, frequency, hematuria and urgency.  Musculoskeletal:  Negative for back pain, joint pain, myalgias and neck pain.  Skin:  Negative for itching and rash.  Neurological:  Negative for tingling, tremors, sensory change, speech change, focal weakness, seizures, weakness and headaches.  Psychiatric/Behavioral:  Negative for memory loss, substance abuse and suicidal ideas. The patient is not nervous/anxious.       Objective:  Physical Exam   Vitals:   06/04/22 1003  BP: (!) 140/80  Pulse: 73  Temp: 98.2 F (36.8 C)  TempSrc: Oral  SpO2: 97%  Weight: 256 lb 9.6 oz (116.4 kg)  Height: '5\' 3"'$  (1.6 m)    Gen: well appearing, no acute distress HENT: NCAT, OP clear, neck supple without masses Eyes: PERRL, EOMi Lymph: no cervical lymphadenopathy PULM: CTA B CV: RRR, no mgr, no JVD GI: BS+, soft, nontender, no hsm Derm: no rash or skin breakdown MSK: normal bulk and tone Neuro: A&Ox4, CN II-XII intact, strength 5/5 in all 4 extremities Psyche: normal mood and affect   CBC    Component Value Date/Time   WBC 8.6 03/17/2022 0854   RBC 4.57 03/17/2022 0854   HGB 12.7 03/17/2022 0854   HCT 39.2 03/17/2022 0854   PLT 284.0 03/17/2022 0854   MCV 85.8 03/17/2022 0854   MCH 28.3 04/27/2021 2240   MCHC 32.3 03/17/2022 0854   RDW 13.8 03/17/2022 0854   LYMPHSABS 3.1 03/17/2022 0854   MONOABS 0.6 03/17/2022 0854   EOSABS 0.9 (H) 03/17/2022 0854   BASOSABS 0.1 03/17/2022 0854      Chest imaging: 12/2021 CXR 2 view personally reviewed: questionable interstitial infiltrate in bases of lungs, borderline cardiomegally  PFT:  Labs: 02/2022  Eosinophils 900k cell/uL  Path:  Echo: 2021 TTE> LVEF 60-65%, RV size and function normal, valves OK  Heart Catheterization:       Assessment & Plan:   Abnormal CXR - Plan: CT Chest High Resolution  Viral upper respiratory tract infection - Plan: Perennial allergen profile IgE  Dyspnea, unspecified type - Plan: Pulmonary function test, fluticasone furoate-vilanterol (BREO ELLIPTA) 200-25 MCG/ACT 1 puff  Wheezing  Allergic rhinitis, unspecified seasonality, unspecified trigger  Eosinophilia, unspecified type  Discussion: 57 year old female presents with symptoms consistent with asthma as she has recurrent bronchitis, wheezing, mucus production in the context of lifelong allergic rhinitis and elevated serum eosinophils.  Differential diagnosis includes bronchiectasis versus recurrent sinopulmonary infections.  I think given the eosinophilia and the allergic rhinitis asthma as a leading diagnosis in the differential right now.  Plan: Shortness of breath with wheezing: Stop Arnuity Start Breo 200, 1 puff daily no matter how you feel Lung function testing Check serum RAST profile to look for evidence of underlying allergy We may need to refer you to an allergist at some point Use albuterol as needed for chest tightness wheezing or shortness  of breath  Abnormal chest x-ray with chronic bronchitis symptoms: High-resolution CT scan of the chest to look for bronchiectasis  We will see you back in 3 to 4 weeks to go over these results, sooner if needed  Immunizations: Immunization History  Administered Date(s) Administered   Moderna Sars-Covid-2 Vaccination 08/05/2019, 09/01/2019, 04/05/2020   Tdap 04/28/2006, 09/12/2021     Current Outpatient Medications:    albuterol (VENTOLIN HFA) 108 (90 Base) MCG/ACT  inhaler, Inhale 2 puffs into the lungs every 6 (six) hours as needed for wheezing or shortness of breath., Disp: 8 g, Rfl: 2   aspirin EC 81 MG tablet, Take 81 mg by mouth daily., Disp: , Rfl:    azelastine (ASTELIN) 0.1 % nasal spray, Place 1 spray into both nostrils 2 (two) times daily. Use in each nostril as directed (Patient taking differently: Place 1 spray into both nostrils daily as needed for rhinitis or allergies. Use in each nostril as directed), Disp: 30 mL, Rfl: 12   Calcium Carb-Cholecalciferol (OYSTER SHELL CALCIUM W/D) 500-5 MG-MCG TABS, TAKE 1 TABLET EVERY DAY, Disp: 90 tablet, Rfl: 10   cloNIDine (CATAPRES) 0.1 MG tablet, TAKE 1 TABLET THREE TIMES DAILY, Disp: 270 tablet, Rfl: 10   cyclobenzaprine (FLEXERIL) 5 MG tablet, Take 1 tablet (5 mg total) by mouth 3 (three) times daily as needed for muscle spasms., Disp: 30 tablet, Rfl: 1   fluticasone (FLONASE) 50 MCG/ACT nasal spray, Place 2 sprays into both nostrils daily. (Patient taking differently: Place 2 sprays into both nostrils daily as needed for allergies.), Disp: 16 g, Rfl: 6   Fluticasone Furoate (ARNUITY ELLIPTA) 100 MCG/ACT AEPB, Inhale 1 Inhalation into the lungs daily., Disp: 30 each, Rfl: 1   gabapentin (NEURONTIN) 600 MG tablet, TAKE 1 TABLET AT BEDTIME, Disp: 90 tablet, Rfl: 10   ketotifen (ZADITOR) 0.025 % ophthalmic solution, Place 2 drops into both eyes daily as needed (for irritation)., Disp: 5 mL, Rfl: 0   losartan (COZAAR) 50 MG tablet, Take 1 tablet (50 mg total) by mouth 2 (two) times daily., Disp: 180 tablet, Rfl: 2   meloxicam (MOBIC) 15 MG tablet, TAKE 1 TABLET EVERY DAY (Patient taking differently: Take 15 mg by mouth daily.), Disp: 90 tablet, Rfl: 0   metoprolol succinate (TOPROL-XL) 50 MG 24 hr tablet, Take 1 tablet (50 mg total) by mouth daily., Disp: 90 tablet, Rfl: 3   montelukast (SINGULAIR) 10 MG tablet, TAKE 1 TABLET AT BEDTIME (Patient taking differently: Take 10 mg by mouth at bedtime.), Disp: 90  tablet, Rfl: 0   Multiple Vitamins-Minerals (ONE-A-DAY WOMENS 50+ ADVANTAGE PO), Take 1 tablet by mouth daily. Unknown strength, Disp: , Rfl:    ofloxacin (FLOXIN OTIC) 0.3 % OTIC solution, Place 5 drops into both ears daily., Disp: 5 mL, Rfl: 0   omeprazole (PRILOSEC) 40 MG capsule, TAKE 1 CAPSULE EVERY DAY, Disp: 90 capsule, Rfl: 3   ondansetron (ZOFRAN) 4 MG tablet, Take 1 tablet (4 mg total) by mouth every 8 (eight) hours as needed for nausea or vomiting., Disp: 20 tablet, Rfl: 0   promethazine-dextromethorphan (PROMETHAZINE-DM) 6.25-15 MG/5ML syrup, Take 5 mLs by mouth 3 (three) times daily as needed for cough., Disp: 118 mL, Rfl: 0   simvastatin (ZOCOR) 40 MG tablet, TAKE 1 TABLET AT BEDTIME, Disp: 90 tablet, Rfl: 0   spironolactone (ALDACTONE) 25 MG tablet, Take 0.5 tablets (12.5 mg total) by mouth daily., Disp: 45 tablet, Rfl: 3   predniSONE (DELTASONE) 10 MG tablet, 3 tabs x3 days and  then 2 tabs x3 days and then 1 tab x3 days.  Take w/ food. (Patient not taking: Reported on 06/04/2022), Disp: 18 tablet, Rfl: 0  Current Facility-Administered Medications:    fluticasone furoate-vilanterol (BREO ELLIPTA) 200-25 MCG/ACT 1 puff, 1 puff, Inhalation, Daily, Juanito Doom, MD

## 2022-06-05 ENCOUNTER — Encounter: Payer: Self-pay | Admitting: Pulmonary Disease

## 2022-06-05 MED ORDER — FLUTICASONE FUROATE-VILANTEROL 200-25 MCG/ACT IN AEPB: 1.0000 | INHALATION_SPRAY | Freq: Every day | RESPIRATORY_TRACT | 5 refills | Status: DC

## 2022-06-06 ENCOUNTER — Ambulatory Visit (HOSPITAL_BASED_OUTPATIENT_CLINIC_OR_DEPARTMENT_OTHER)
Admission: RE | Admit: 2022-06-06 | Discharge: 2022-06-06 | Disposition: A | Discharge status: Home / Self Care | Payer: PPO | Source: Ambulatory Visit | Attending: Pulmonary Disease | Admitting: Pulmonary Disease

## 2022-06-06 DIAGNOSIS — R9389 Abnormal findings on diagnostic imaging of other specified body structures: Secondary | ICD-10-CM | POA: Diagnosis not present

## 2022-06-06 DIAGNOSIS — J398 Other specified diseases of upper respiratory tract: Secondary | ICD-10-CM | POA: Diagnosis not present

## 2022-06-08 LAB — ALLERGEN PROFILE, PERENNIAL ALLERGEN IGE
Alternaria Alternata IgE: <0.1 kU/L
Aspergillus Fumigatus IgE: <0.1 kU/L
Aureobasidi Pullulans IgE: <0.1 kU/L
Candida Albicans IgE: 0.1 kU/L — AB
Cat Dander IgE: <0.1 kU/L
Chicken Feathers IgE: <0.1 kU/L
Cladosporium Herbarum IgE: <0.1 kU/L
Cow Dander IgE: <0.1 kU/L
D Farinae IgE: <0.1 kU/L
D Pteronyssinus IgE: <0.1 kU/L
Dog Dander IgE: <0.1 kU/L
Duck Feathers IgE: <0.1 kU/L
Goose Feathers IgE: <0.1 kU/L
Mouse Urine IgE: <0.1 kU/L
Mucor Racemosus IgE: <0.1 kU/L
Penicillium Chrysogen IgE: <0.1 kU/L
Phoma Betae IgE: <0.1 kU/L
Setomelanomma Rostrat: <0.1 kU/L
Stemphylium Herbarum IgE: <0.1 kU/L

## 2022-06-24 ENCOUNTER — Ambulatory Visit (INDEPENDENT_AMBULATORY_CARE_PROVIDER_SITE_OTHER): Payer: PPO | Admitting: Pulmonary Disease

## 2022-06-24 DIAGNOSIS — R0600 Dyspnea, unspecified: Secondary | ICD-10-CM

## 2022-06-24 LAB — PULMONARY FUNCTION TEST
DL/VA % pred: 127 %
DL/VA: 5.47 ml/min/mmHg/L
DLCO cor % pred: 114 %
DLCO cor: 22.88 ml/min/mmHg
DLCO unc % pred: 114 %
DLCO unc: 22.88 ml/min/mmHg
FEF 25-75 Post: 3.49 L/sec
FEF 25-75 Pre: 1.91 L/sec
FEF2575-%Change-Post: 82 %
FEF2575-%Pred-Post: 139 %
FEF2575-%Pred-Pre: 76 %
FEV1-%Change-Post: 13 %
FEV1-%Pred-Post: 91 %
FEV1-%Pred-Pre: 80 %
FEV1-Post: 2.38 L
FEV1-Pre: 2.1 L
FEV1FVC-%Change-Post: 16 %
FEV1FVC-%Pred-Pre: 97 %
FEV6-%Change-Post: 0 %
FEV6-%Pred-Post: 82 %
FEV6-%Pred-Pre: 82 %
FEV6-Post: 2.64 L
FEV6-Pre: 2.66 L
FEV6FVC-%Change-Post: 0 %
FEV6FVC-%Pred-Post: 103 %
FEV6FVC-%Pred-Pre: 103 %
FVC-%Change-Post: -2 %
FVC-%Pred-Post: 79 %
FVC-%Pred-Pre: 81 %
FVC-Post: 2.64 L
FVC-Pre: 2.71 L
Post FEV1/FVC ratio: 90 %
Post FEV6/FVC ratio: 100 %
Pre FEV1/FVC ratio: 77 %
Pre FEV6/FVC Ratio: 100 %
RV % pred: 81 %
RV: 1.49 L
TLC % pred: 92 %
TLC: 4.53 L

## 2022-06-24 NOTE — Progress Notes (Signed)
Full PFT performed today. °

## 2022-06-24 NOTE — Patient Instructions (Signed)
Full PFT performed today. °

## 2022-06-25 ENCOUNTER — Telehealth: Payer: Self-pay

## 2022-06-25 NOTE — Patient Outreach (Signed)
  Care Coordination   06/25/2022 Name: DEBORAHA OTWELL MRN: [id number] DOB: December 05, 1965   Care Coordination Outreach Attempts:  An unsuccessful telephone outreach was attempted today to offer the patient information about available care coordination services as a benefit of their health plan.   Follow Up Plan:  Additional outreach attempts will be made to offer the patient care coordination information and services.   Encounter Outcome:  No Answer   Care Coordination Interventions:  No, not indicated    Jone Baseman, RN, MSN [facility] Care Management Coordinator Direct Line [phone]

## 2022-07-02 ENCOUNTER — Ambulatory Visit: Payer: PPO | Admitting: Pulmonary Disease

## 2022-07-08 ENCOUNTER — Ambulatory Visit: Payer: Medicare HMO | Admitting: Cardiology

## 2022-07-10 DIAGNOSIS — M79672 Pain in left foot: Secondary | ICD-10-CM | POA: Diagnosis not present

## 2022-07-14 ENCOUNTER — Other Ambulatory Visit: Payer: Self-pay | Admitting: *Deleted

## 2022-07-15 ENCOUNTER — Ambulatory Visit (INDEPENDENT_AMBULATORY_CARE_PROVIDER_SITE_OTHER): Payer: PPO | Admitting: Adult Health

## 2022-07-15 ENCOUNTER — Encounter: Payer: Self-pay | Admitting: Adult Health

## 2022-07-15 VITALS — BP 120/86 | HR 80 | Temp 97.7°F | Ht 63.0 in | Wt 260.0 lb

## 2022-07-15 DIAGNOSIS — J309 Allergic rhinitis, unspecified: Secondary | ICD-10-CM | POA: Diagnosis not present

## 2022-07-15 DIAGNOSIS — J45909 Unspecified asthma, uncomplicated: Secondary | ICD-10-CM | POA: Insufficient documentation

## 2022-07-15 DIAGNOSIS — J4530 Mild persistent asthma, uncomplicated: Secondary | ICD-10-CM | POA: Diagnosis not present

## 2022-07-15 DIAGNOSIS — G4733 Obstructive sleep apnea (adult) (pediatric): Secondary | ICD-10-CM

## 2022-07-15 NOTE — Assessment & Plan Note (Signed)
Continue on CPAP at bedtime.  Continue follow-up with neurology °

## 2022-07-15 NOTE — Progress Notes (Signed)
@Patient  ID: Renee Flores, female    DOB: 05-05-1965, 57 y.o.   MRN: [id number]  Chief Complaint  Patient presents with   Follow-up    Referring provider: [name], MD  HPI: 57 year old female seen for pulmonary consult June 04, 2022 for cough, shortness of breath wheezing and elevated eosinophil count -felt to have underlying asthma and allergic rhinitis  TEST/EVENTS :  RAST panel June 04, 2022 essentially negative Absolute eosinophil count March 17, 2022 900  Asthma workup:  Previous exposure to mold in home and possible asbestos.(Shingles)     07/15/2022 Follow up : Asthma and Allergic Rhinitis  Patient presents for a 6-week follow-up.  Patient was seen last visit for a pulmonary consult for cough shortness of breath and wheezing.  Previous Lab work had showed an elevated eosinophil count.  Symptoms were consistent with asthma with allergic phenotype she was previously tried on Advair and Arnuity without perceived benefit.  Last visit patient was started on Breo daily.  Continued on Singulair, Zyrtec , Astelin and Flonase.  She says since last visit she is feeling much better.  Feels that [facility] is really helping quite a bit.  She says her cough had substantially improved.  She did run out of Breo the last couple days.  Says she will not be able to get it till tomorrow due to finances.  She has been using her albuterol but doing okay.  She does have some increased nasal congestion and drainage feels it is from the seasonal changes and pollen outside. PFTs done on June 24, 2022 showed no significant airflow obstruction or restriction.  FEV1 at 91%, ratio 90, FVC 79%, positive bronchodilator response, DLCO 114%.  We discussed her PFT results. High-resolution CT chest done on June 06, 2022 showed minimum appearing scarring in the right base, no evidence of fibrotic lung disease.  And mild tracheobronchial [place] on expiratory phase imaging.  Lobular air  trapping consistent with small airways disease.  We reviewed her CT scan results in detail. Patient does have sleep apnea is on nocturnal CPAP followed by neurology.  Says she wears her CPAP every single night does not miss the night.    Allergies  Allergen Reactions   Hydrochlorothiazide Nausea And Vomiting    Other reaction(s): Other (See Comments) Leg Cramps Leg Cramps  Other reaction(s): Other (See Comments) Leg Cramps   Morphine Other (See Comments), Rash and Nausea And Vomiting    Hallucinating  Other reaction(s): Other (See Comments) Hallucinating Hallucinating Hallucinating Hallucinating  Hallucinating  Hallucinating  Other reaction(s): Other (See Comments) Hallucinating Hallucinating   Vancomycin Nausea And Vomiting    Ringing in ears   Erythromycin Base Hives   Erythromycin Base Hives   Vancomycin Hcl Other (See Comments)    Ringing in ears   Doxycycline Nausea Only and Rash    Immunization History  Administered Date(s) Administered   Moderna Sars-Covid-2 Vaccination 08/05/2019, 09/01/2019, 04/05/2020   Tdap 04/28/2006, 09/12/2021    Past Medical History:  Diagnosis Date   Allergy    Arthritis    Bulging lumbar disc    [id number]   Complication of anesthesia    anxiety attack after waking up from anesthesia   Factor 5 Leiden mutation, heterozygous ([place])    Factor V deficiency, congenital ([place]) 03/23/2012   GERD (gastroesophageal reflux disease)    Heart murmur    Heart palpitations    Heart valve problem    mild- moderate mitral valve leakage    High  cholesterol    HYPERCHOLESTEROLEMIA 02/27/2010   Qualifier: Diagnosis of  By: [name], Denise     Hypersomnia with sleep apnea, unspecified 11/03/2013   Hypertension    Lumbar radiculopathy 08/12/2018   MS (multiple sclerosis) ([place])    questionable   Neuromuscular disorder ([place])    OSA on CPAP 03/25/2017   Osteopenia 04/2016   T score -1.9   Osteoporosis    Preop cardiovascular exam 03/23/2012    Retrognathia 11/03/2013   Right knee DJD 07/08/2012   Severe obesity (BMI >= 40) ([facility]) 11/03/2013   Shortness of breath 06/03/2013   Sleep apnea    Vitamin D deficiency 2014   17    Tobacco History: Social History   Tobacco Use  Smoking Status Never   Passive exposure: Never  Smokeless Tobacco Never   Counseling given: Not Answered   Outpatient Medications Prior to Visit  Medication Sig Dispense Refill   albuterol (VENTOLIN HFA) 108 (90 Base) MCG/ACT inhaler Inhale 2 puffs into the lungs every 6 (six) hours as needed for wheezing or shortness of breath. 8 g 2   aspirin EC 81 MG tablet Take 81 mg by mouth daily.     azelastine (ASTELIN) 0.1 % nasal spray Place 1 spray into both nostrils 2 (two) times daily. Use in each nostril as directed (Patient taking differently: Place 1 spray into both nostrils daily as needed for rhinitis or allergies. Use in each nostril as directed) 30 mL 12   Calcium Carb-Cholecalciferol (OYSTER SHELL CALCIUM W/D) 500-5 MG-MCG TABS TAKE 1 TABLET EVERY DAY 90 tablet 10   cloNIDine (CATAPRES) 0.1 MG tablet TAKE 1 TABLET THREE TIMES DAILY 270 tablet 10   cyclobenzaprine (FLEXERIL) 5 MG tablet Take 1 tablet (5 mg total) by mouth 3 (three) times daily as needed for muscle spasms. 30 tablet 1   fluticasone (FLONASE) 50 MCG/ACT nasal spray Place 2 sprays into both nostrils daily. (Patient taking differently: Place 2 sprays into both nostrils daily as needed for allergies.) 16 g 6   gabapentin (NEURONTIN) 600 MG tablet TAKE 1 TABLET AT BEDTIME 90 tablet 10   ketotifen (ZADITOR) 0.025 % ophthalmic solution Place 2 drops into both eyes daily as needed (for irritation). 5 mL 0   losartan (COZAAR) 50 MG tablet Take 1 tablet (50 mg total) by mouth 2 (two) times daily. 180 tablet 2   meloxicam (MOBIC) 15 MG tablet TAKE 1 TABLET EVERY DAY (Patient taking differently: Take 15 mg by mouth daily.) 90 tablet 0   metoprolol succinate (TOPROL-XL) 50 MG 24 hr tablet Take 1 tablet (50 mg  total) by mouth daily. 90 tablet 3   montelukast (SINGULAIR) 10 MG tablet TAKE 1 TABLET AT BEDTIME (Patient taking differently: Take 10 mg by mouth at bedtime.) 90 tablet 0   Multiple Vitamins-Minerals (ONE-A-DAY WOMENS 50+ ADVANTAGE PO) Take 1 tablet by mouth daily. Unknown strength     ofloxacin (FLOXIN OTIC) 0.3 % OTIC solution Place 5 drops into both ears daily. 5 mL 0   omeprazole (PRILOSEC) 40 MG capsule TAKE 1 CAPSULE EVERY DAY 90 capsule 3   ondansetron (ZOFRAN) 4 MG tablet Take 1 tablet (4 mg total) by mouth every 8 (eight) hours as needed for nausea or vomiting. 20 tablet 0   promethazine-dextromethorphan (PROMETHAZINE-DM) 6.25-15 MG/5ML syrup Take 5 mLs by mouth 3 (three) times daily as needed for cough. 118 mL 0   simvastatin (ZOCOR) 40 MG tablet TAKE 1 TABLET AT BEDTIME 90 tablet 0   spironolactone (  ALDACTONE) 25 MG tablet Take 0.5 tablets (12.5 mg total) by mouth daily. 45 tablet 3   fluticasone furoate-vilanterol (BREO ELLIPTA) 200-25 MCG/ACT AEPB Inhale 1 puff into the lungs daily. (Patient not taking: Reported on 07/15/2022) 60 each 5   Fluticasone Furoate (ARNUITY ELLIPTA) 100 MCG/ACT AEPB Inhale 1 Inhalation into the lungs daily. (Patient not taking: Reported on 07/15/2022) 30 each 1   predniSONE (DELTASONE) 10 MG tablet 3 tabs x3 days and then 2 tabs x3 days and then 1 tab x3 days.  Take w/ food. (Patient not taking: Reported on 06/04/2022) 18 tablet 0   No facility-administered medications prior to visit.     Review of Systems:   Constitutional:   No  weight loss, night sweats,  Fevers, chills, fatigue, or  lassitude.  HEENT:   No headaches,  Difficulty swallowing,  Tooth/dental problems, or  Sore throat,                No sneezing, itching, ear ache, nasal congestion, post nasal drip,   CV:  No chest pain,  Orthopnea, PND, swelling in lower extremities, anasarca, dizziness, palpitations, syncope.   GI  No heartburn, indigestion, abdominal pain, nausea, vomiting, diarrhea,  change in bowel habits, loss of appetite, bloody stools.   Resp: No shortness of breath with exertion or at rest.  No excess mucus, no productive cough,  No non-productive cough,  No coughing up of blood.  No change in color of mucus.  No wheezing.  No chest wall deformity  Skin: no rash or lesions.  GU: no dysuria, change in color of urine, no urgency or frequency.  No flank pain, no hematuria   MS:  No joint pain or swelling.  No decreased range of motion.  No back pain.    Physical Exam  BP 120/86 (BP Location: Left Arm, Patient Position: Sitting, Cuff Size: Large)   Pulse 80   Temp 97.7 F (36.5 C) (Oral)   Ht 5\' 3"  (1.6 m)   Wt 260 lb (117.9 kg)   SpO2 94%   BMI 46.06 kg/m   GEN: A/Ox3; pleasant , NAD, well nourished    HEENT:  [place]/AT,  EACs-clear, TMs-wnl, NOSE-clear, THROAT-clear, no lesions, no postnasal drip or exudate noted.   NECK:  Supple w/ fair ROM; no JVD; normal carotid impulses w/o bruits; no thyromegaly or nodules palpated; no lymphadenopathy.    RESP  Clear  P & A; w/o, wheezes/ rales/ or rhonchi. no accessory muscle use, no dullness to percussion  CARD:  RRR, no m/r/g, no peripheral edema, pulses intact, no cyanosis or clubbing.  GI:   Soft & nt; nml bowel sounds; no organomegaly or masses detected.   Musco: Warm bil, no deformities or joint swelling noted.   Neuro: alert, no focal deficits noted.    Skin: Warm, no lesions or rashes    Lab Results:  CBC    Component Value Date/Time   WBC 8.6 03/17/2022 0854   RBC 4.57 03/17/2022 0854   HGB 12.7 03/17/2022 0854   HCT 39.2 03/17/2022 0854   PLT 284.0 03/17/2022 0854   MCV 85.8 03/17/2022 0854   MCH 28.3 04/27/2021 2240   MCHC 32.3 03/17/2022 0854   RDW 13.8 03/17/2022 0854   LYMPHSABS 3.1 03/17/2022 0854   MONOABS 0.6 03/17/2022 0854   EOSABS 0.9 (H) 03/17/2022 0854   BASOSABS 0.1 03/17/2022 0854    BMET    Component Value Date/Time   NA 138 03/17/2022 0854   NA 141  01/10/2022 1054    K 4.1 03/17/2022 0854   CL 102 03/17/2022 0854   CO2 29 03/17/2022 0854   GLUCOSE 109 (H) 03/17/2022 0854   BUN 11 03/17/2022 0854   BUN 12 01/10/2022 1054   CREATININE 0.73 03/17/2022 0854   CALCIUM 9.6 03/17/2022 0854   GFRNONAA >60 04/27/2021 2240   GFRAA >90 06/01/2013 0925    BNP No results found for: "BNP"  ProBNP No results found for: "PROBNP"  Imaging: No results found.       Latest Ref Rng & Units 06/24/2022    2:45 PM  PFT Results  FVC-Pre L 2.71   FVC-Predicted Pre % 81   FVC-Post L 2.64   FVC-Predicted Post % 79   Pre FEV1/FVC % % 77   Post FEV1/FCV % % 90   FEV1-Pre L 2.10   FEV1-Predicted Pre % 80   FEV1-Post L 2.38   DLCO uncorrected ml/min/mmHg 22.88   DLCO UNC% % 114   DLCO corrected ml/min/mmHg 22.88   DLCO COR %Predicted % 114   DLVA Predicted % 127   TLC L 4.53   TLC % Predicted % 92   RV % Predicted % 81     No results found for: "NITRICOXIDE"      Assessment & Plan:   Asthma Mild persistent asthma with allergic phenotype.  Patient is improved on current maintenance regimen with Breo, Singulair, Zyrtec, Flonase and Astelin.  Continue with trigger prevention.  Also would continue on PPI therapy for underlying reflux. PFTs showed no significant airflow obstruction however had significant bronchodilator response.  Consistent with asthmatic component. CT chest was negative for ILD.  Did show some mild tracheobronchomalacia on expiratory phase.  Would recommend trigger prevention also is on CPAP at bedtime.  Continue with cough control regimen.  Plan  Patient Instructions  Restart BREO 1 puff daily, rinse after use.  Continue on Zyrtec and Singulair daily  Continue Astelin and Flonase daily  Albuterol inhaler As needed   Asthma action plan as discussed.  Continue on Omeprazole daily .  Continue on CPAP At bedtime   Follow up with Dr. Erin Fulling or Malky Rudzinski NP in 3 months (30 min slot ) .  Please contact office for sooner follow up if  symptoms do not improve or worsen or seek emergency care        Allergic rhinitis Continue on current maintenance regimen.  Trigger prevention.  OSA on CPAP Continue on CPAP at bedtime.  Continue follow-up with neurology     Rexene Edison, NP 07/15/2022

## 2022-07-15 NOTE — Assessment & Plan Note (Signed)
Continue on current maintenance regimen.  Trigger prevention.

## 2022-07-15 NOTE — Patient Instructions (Addendum)
Restart BREO 1 puff daily, rinse after use.  Continue on Zyrtec and Singulair daily  Continue Astelin and Flonase daily  Albuterol inhaler As needed   Asthma action plan as discussed.  Continue on Omeprazole daily .  Continue on CPAP At bedtime   Follow up with Dr. Erin Fulling or Kalix Meinecke NP in 3 months (30 min slot ) .  Please contact office for sooner follow up if symptoms do not improve or worsen or seek emergency care

## 2022-07-15 NOTE — Assessment & Plan Note (Addendum)
Mild persistent asthma with allergic phenotype.  Patient is improved on current maintenance regimen with Breo, Singulair, Zyrtec, Flonase and Astelin.  Continue with trigger prevention.  Also would continue on PPI therapy for underlying reflux. PFTs showed no significant airflow obstruction however had significant bronchodilator response.  Consistent with asthmatic component. CT chest was negative for ILD.  Did show some mild tracheobronchomalacia on expiratory phase.  Would recommend trigger prevention also is on CPAP at bedtime.  Continue with cough control regimen.  Plan  Patient Instructions  Restart BREO 1 puff daily, rinse after use.  Continue on Zyrtec and Singulair daily  Continue Astelin and Flonase daily  Albuterol inhaler As needed   Asthma action plan as discussed.  Continue on Omeprazole daily .  Continue on CPAP At bedtime   Follow up with Renee Flores or Renee Macdowell NP in 3 months (30 min slot ) .  Please contact office for sooner follow up if symptoms do not improve or worsen or seek emergency care

## 2022-07-22 ENCOUNTER — Telehealth: Payer: Self-pay

## 2022-07-22 NOTE — Telephone Encounter (Signed)
Pt needs an order for a mammogram [facility]  ok to place order ?

## 2022-07-23 ENCOUNTER — Ambulatory Visit: Payer: PPO | Admitting: Pharmacist

## 2022-07-23 NOTE — Progress Notes (Signed)
Care Management & Coordination Services Pharmacy Note  07/24/2022 Name:  Renee Flores MRN:  [id number] DOB:  May 19, 1965  Summary: Pharmd FU visit.  Recently started [facility] for asthma and is tolerating and affordable.  Last DEXA 2018 - she is due for mammogram so consider DEXA at the same time.  Recommendations/Changes made from today's visit: Order DEXA with mammogram  Follow up plan: Fu 6 months   Subjective: Renee Flores is an 57 y.o. year old female who is a primary patient of Birdie Riddle, Aundra Millet, MD.  The care coordination team was consulted for assistance with disease management and care coordination needs.    Engaged with patient by telephone for follow up visit.  Recent office visits:  07/15/21 Maximiano Coss, NP - Lower respiratory infection - azithromycin (ZITHROMAX) 250 MG tablet,  fluticasone-salmeterol (ADVAIR DISKUS) 250-50 MCG/ACT AEPB, and predniSONE (DELTASONE) 20 MG tablet prescribed. Follow up if no improvement.    Recent consult visits:  12/31/21 Agustin Cree, Cardiology) - planned to start HCTZ or spironolactone depending on potassium levels and kidney function.   Hospital visits: 04/27/21 Medication Reconciliation was completed by comparing discharge summary, patient's EMR and Pharmacy list, and upon discussion with patient.   Admitted to the hospital on 04/27/21 due to Post Op Pain. Discharge date was 04/28/21. Discharged from [facility].     New?Medications Started at [facility] Discharge:?? None noted.    Medication Changes at Hospital Discharge: None noted.    Medications Discontinued at Hospital Discharge: None noted.    Medications that remain the same after Hospital Discharge:??  All other medications will remain the same.     Hospital visits: 03/23/21 Medication Reconciliation was completed by comparing discharge summary, patient's EMR and Pharmacy list, and upon discussion with patient.   Admitted to the hospital on 03/23/21 due to  Acute cough. Discharge date was 03/23/21. Discharged from [facility] [place].     New?Medications Started at [facility] Discharge:?? albuterol (VENTOLIN HFA) 108 (90 Base) MCG/ACT inhaler   Medication Changes at Hospital Discharge: None noted.    Medications Discontinued at Hospital Discharge: None noted.    Objective:  Lab Results  Component Value Date   CREATININE 0.73 03/17/2022   BUN 11 03/17/2022   GFR 92.63 03/17/2022   EGFR 88 01/10/2022   GFRNONAA >60 04/27/2021   GFRAA >90 06/01/2013   NA 138 03/17/2022   K 4.1 03/17/2022   CALCIUM 9.6 03/17/2022   CO2 29 03/17/2022   GLUCOSE 109 (H) 03/17/2022    Lab Results  Component Value Date/Time   HGBA1C 6.4 03/17/2022 08:54 AM   HGBA1C 6.2 09/12/2021 04:22 PM   GFR 92.63 03/17/2022 08:54 AM   GFR 91.45 09/12/2021 08:28 AM    Last diabetic Eye exam: No results found for: "HMDIABEYEEXA"  Last diabetic Foot exam: No results found for: "HMDIABFOOTEX"   Lab Results  Component Value Date   CHOL 158 03/17/2022   HDL 48.80 03/17/2022   LDLCALC 72 03/17/2022   LDLDIRECT 81.0 09/12/2021   TRIG 186.0 (H) 03/17/2022   CHOLHDL 3 03/17/2022       Latest Ref Rng & Units 03/17/2022    8:54 AM 09/12/2021    8:28 AM 03/14/2021    9:06 AM  Hepatic Function  Total Protein 6.0 - 8.3 g/dL 7.5  7.1  7.5   Albumin 3.5 - 5.2 g/dL 4.3  4.4  4.6   AST 0 - 37 U/L 36  35  35   ALT 0 - 35  U/L 49  44  47   Alk Phosphatase 39 - 117 U/L 101  124  99   Total Bilirubin 0.2 - 1.2 mg/dL 0.4  0.5  0.4   Bilirubin, Direct 0.0 - 0.3 mg/dL 0.1  0.1  0.1     Lab Results  Component Value Date/Time   TSH 2.16 03/17/2022 08:54 AM   TSH 1.81 09/12/2021 08:28 AM       Latest Ref Rng & Units 03/17/2022    8:54 AM 09/12/2021    8:28 AM 04/27/2021   10:40 PM  CBC  WBC 4.0 - 10.5 K/uL 8.6  6.6  10.2   Hemoglobin 12.0 - 15.0 g/dL 12.7  12.2  11.5   Hematocrit 36.0 - 46.0 % 39.2  38.3  36.1   Platelets 150.0 - 400.0 K/uL 284.0  249.0   269     Lab Results  Component Value Date/Time   VD25OH 28.44 (L) 09/12/2021 08:28 AM   VD25OH 28.82 (L) 09/11/2020 10:37 AM    Clinical ASCVD: No  The 10-year ASCVD risk score (Arnett DK, et al., 2019) is: 3.9%   Values used to calculate the score:     Age: 38 years     Sex: Female     Is Non-Hispanic African American: No     Diabetic: Yes     Tobacco smoker: No     Systolic Blood Pressure: 123456 mmHg     Is BP treated: Yes     HDL Cholesterol: 48.8 mg/dL     Total Cholesterol: 158 mg/dL       05/08/2022   11:22 AM 03/06/2022   11:18 AM 01/06/2022    8:33 AM  Depression screen PHQ 2/9  Decreased Interest 0 0 0  Down, Depressed, Hopeless 0 0 0  PHQ - 2 Score 0 0 0  Altered sleeping 0 3 0  Tired, decreased energy 0 1 0  Change in appetite 0 0 0  Feeling bad or failure about yourself  0 0 0  Trouble concentrating 0 0 0  Moving slowly or fidgety/restless 0 1 0  Suicidal thoughts 0 0 0  PHQ-9 Score 0 5 0  Difficult doing work/chores Not difficult at all Not difficult at all      Social History   Tobacco Use  Smoking Status Never   Passive exposure: Never  Smokeless Tobacco Never   BP Readings from Last 3 Encounters:  07/15/22 120/86  06/04/22 (!) 140/80  05/27/22 132/84   Pulse Readings from Last 3 Encounters:  07/15/22 80  06/04/22 73  05/27/22 86   Wt Readings from Last 3 Encounters:  07/15/22 260 lb (117.9 kg)  06/04/22 256 lb 9.6 oz (116.4 kg)  05/27/22 256 lb (116.1 kg)   BMI Readings from Last 3 Encounters:  07/15/22 46.06 kg/m  06/04/22 45.45 kg/m  05/27/22 45.35 kg/m    Allergies  Allergen Reactions   Hydrochlorothiazide Nausea And Vomiting    Other reaction(s): Other (See Comments) Leg Cramps Leg Cramps  Other reaction(s): Other (See Comments) Leg Cramps   Morphine Other (See Comments), Rash and Nausea And Vomiting    Hallucinating  Other reaction(s): Other (See Comments) Hallucinating Hallucinating Hallucinating  Hallucinating  Hallucinating  Hallucinating  Other reaction(s): Other (See Comments) Hallucinating Hallucinating   Vancomycin Nausea And Vomiting    Ringing in ears   Erythromycin Base Hives   Erythromycin Base Hives   Vancomycin Hcl Other (See Comments)    Ringing in ears  Doxycycline Nausea Only and Rash    Medications Reviewed Today     Reviewed by Edythe Clarity, [facility] (Pharmacist) on 07/24/22 at [place] List Status: <None>   Medication Order Taking? Sig Documenting Provider Last Dose Status Informant  albuterol (VENTOLIN HFA) 108 (90 Base) MCG/ACT inhaler [id number] Yes Inhale 2 puffs into the lungs every 6 (six) hours as needed for wheezing or shortness of breath. [name], MD Taking Active   aspirin EC 81 MG tablet [id number] Yes Take 81 mg by mouth daily. [provider] Taking Active   azelastine (ASTELIN) 0.1 % nasal spray [id number] Yes Place 1 spray into both nostrils 2 (two) times daily. Use in each nostril as directed  Patient taking differently: Place 1 spray into both nostrils daily as needed for rhinitis or allergies. Use in each nostril as directed   Maximiano Coss, NP Taking Active   Calcium Carb-Cholecalciferol (OYSTER SHELL CALCIUM W/D) 500-5 MG-MCG TABS [id number] Yes TAKE 1 TABLET EVERY DAY [name], MD Taking Active   cloNIDine (CATAPRES) 0.1 MG tablet [id number] Yes TAKE 1 TABLET THREE TIMES DAILY [name], MD Taking Active   cyclobenzaprine (FLEXERIL) 5 MG tablet [id number] Yes Take 1 tablet (5 mg total) by mouth 3 (three) times daily as needed for muscle spasms. Maximiano Coss, NP Taking Active   fluticasone [facility]) 50 MCG/ACT nasal spray [id number] Yes Place 2 sprays into both nostrils daily.  Patient taking differently: Place 2 sprays into both nostrils daily as needed for allergies.   [name], MD Taking Active   fluticasone furoate-vilanterol (BREO ELLIPTA) 200-25 MCG/ACT AEPB [id number] Yes Inhale 1  puff into the lungs daily. Juanito Doom, MD Taking Active   gabapentin (NEURONTIN) 600 MG tablet [id number] Yes TAKE 1 TABLET AT BEDTIME [name], MD Taking Active   ketotifen (ZADITOR) 0.025 % ophthalmic solution [id number] Yes Place 2 drops into both eyes daily as needed (for irritation). [name], MD Taking Active   losartan (COZAAR) 50 MG tablet [id number] Yes Take 1 tablet (50 mg total) by mouth 2 (two) times daily. Park Liter, MD Taking Active   meloxicam [facility]) 15 MG tablet [id number] Yes TAKE 1 TABLET EVERY DAY  Patient taking differently: Take 15 mg by mouth daily.   [name], MD Taking Active   metoprolol succinate (TOPROL-XL) 50 MG 24 hr tablet [id number] Yes Take 1 tablet (50 mg total) by mouth daily. Park Liter, MD Taking Active   montelukast (SINGULAIR) 10 MG tablet [id number] Yes TAKE 1 TABLET AT BEDTIME  Patient taking differently: Take 10 mg by mouth at bedtime.   [name], MD Taking Active   Multiple Vitamins-Minerals (ONE-A-DAY WOMENS 50+ ADVANTAGE PO) [id number] Yes Take 1 tablet by mouth daily. Unknown strength [provider] Taking Active   ofloxacin (FLOXIN OTIC) 0.3 % OTIC solution [id number] Yes Place 5 drops into both ears daily. Maximiano Coss, NP Taking Active   omeprazole (PRILOSEC) 40 MG capsule [id number] Yes TAKE 1 CAPSULE EVERY DAY [name], MD Taking Active   ondansetron [facility]) 4 MG tablet [id number] Yes Take 1 tablet (4 mg total) by mouth every 8 (eight) hours as needed for nausea or vomiting. Maximiano Coss, NP Taking Active   promethazine-dextromethorphan (PROMETHAZINE-DM) 6.25-15 MG/5ML syrup [id number] Yes Take 5 mLs by mouth 3 (three) times daily as needed for cough. [name], MD Taking Active   simvastatin (ZOCOR) 40 MG tablet [id number] Yes TAKE 1 TABLET  AT BEDTIME [name], MD Taking Active   spironolactone (ALDACTONE) 25 MG tablet [id number] Yes Take 0.5  tablets (12.5 mg total) by mouth daily. Park Liter, MD Taking Active             SDOH:  (Social Determinants of Health) assessments and interventions performed: No , done within year Financial Resource Strain: Low Risk  (09/26/2021)   Overall Financial Resource Strain (CARDIA)    Difficulty of Paying Living Expenses: Not hard at all   Food Insecurity: No Food Insecurity (09/26/2021)   Hunger Vital Sign    Worried About Running Out of Food in the Last Year: Never true    Ran Out of Food in the Last Year: Never true    SDOH Interventions    Flowsheet Row Clinical Support from 09/26/2021 in [place] at [place] from 07/30/2020 in [place] at [place] Interventions Intervention Not Indicated --  Housing Interventions Intervention Not Indicated --  Transportation Interventions Intervention Not Indicated --  Financial Strain Interventions Intervention Not Indicated --  Physical Activity Interventions Intervention Not Indicated Intervention Not Indicated  [pt has set a goal to increase activity]  Stress Interventions Intervention Not Indicated --  Social Connections Interventions Intervention Not Indicated --       Medication Assistance: None required.  Patient affirms current coverage meets needs.  Medication Access: Within the past 30 days, how often has patient missed a dose of medication? 0 Is a pillbox or other method used to improve adherence? Yes  Factors that may affect medication adherence? no barriers identified Are meds synced by current pharmacy? No  Are meds delivered by current pharmacy? No  Does patient experience delays in picking up medications due to transportation concerns? No   [facility] Services Reviewed: Is patient disadvantaged to use [facility]?: Yes  Current Rx insurance plan: HTA Name and location of Current pharmacy:   [id number] [place] - [address] [place] [address] Phone: [phone] Fax: [phone]  [facility] services reviewed with patient today?: Yes  Patient requests to transfer care to [facility]?: No  Reason patient declined to change pharmacies: Disadvantaged due to insurance/mail order  Compliance/Adherence/Medication fill history: Care Gaps: None  Star-Rating Drugs: Simvastatin 40mg  03/19/22 90ds - reports some on hand Losartan 04/09/22 90ds - reports some on hand   Assessment/Plan     Hypertension (BP goal <140/90) 07/24/22 Controlled, not assessed -Current treatment: Clonidone 0.1mg  three times per day Appropriate, Effective, Safe, Accessible Losartan 50mg  twice daily Appropriate, Effective, Safe, Accessible Metoprolol XL  50mg  daily Appropriate, Effective, Safe, Accessible Spironolactone 12.5mg  daily Appropriate, Query effective, ,  -Medications previously tried: HCTZ -Current home readings:  -Denies hypotensive/hypertensive symptoms -Educated on BP goals and benefits of medications for prevention of heart attack, stroke and kidney damage; Daily salt intake goal < 2300 mg; Exercise goal of 150 minutes per week; Importance of home blood pressure monitoring; -Counseled to monitor BP at home a few times per week before and after medications, document, and provide log at future appointments -BP was at goal last office visit.  Unsure what readings have been at home.  She has FU upcoming with cardio for labs. -Needs to monitor at home, will have CMA check in 30 days to assess BP  Hyperlipidemia: (LDL goal < 100) -Controlled, not assessed today -  Current treatment: Simvastatin 40mg  Appropriate, Effective, Safe, Accessible -Medications previously tried: none noted  -Educated on Cholesterol goals;  Benefits of statin for ASCVD risk reduction; Importance of limiting foods high  in cholesterol; -Recommended to continue current medication LDL controlled at this time - no adverse effects with medication   Osteopenia (Goal Maintain bone density, prevent fractures/falls) 07/23/22 -Controlled, due for updated DEXA -Last DEXA Scan: 2018   T-Score total hip: -1.9  T-Score lumbar spine: 0.4 -Patient is not a candidate for pharmacologic treatment -Current treatment  Oyster Shell Calcium + Vitamin D Appropriate, Effective, Safe, Accessible -Medications previously tried: none noted -Recommend [phone] units of vitamin D daily. Recommend 1200 mg of calcium daily from dietary and supplemental sources. Recommend weight-bearing and muscle strengthening exercises for building and maintaining bone density.  -Patient expressed concern for DEXA due to family hx. -She discussed needing a mammogram coming up.   Also due for DEXA, would just make sense to get these done at the same time.  Will discuss order with PCP so we can coordinate them together. No changes to treatment, fall precautions noted!  Asthma (Goal: Control symtpoms) 07/23/22 -Controlled  -Current treatment  Breo 1 puff daily Appropriate, Effective, Safe, Accessible Albuterol HFA 22mcg prn Appropriate, Effective, Safe, Accessible -Medications previously tried: None noted -Recently started on Breo and she says this is working well to control her breathing.  She is only using her rescue inhaler a few times per week during high exertion periods.  Cop[employer] is affordable.  She like the once daily inhalation.    -Recommended to continue current medication No changes needed, continue to monitor symptoms.  Allergies (Goal: Minimize symptoms) -Controlled, not assessed today -Current treatment  Montelukast 10mg  hs Appropriate, Effective, Safe, Accessible Flonase 53mcg prn Appropriate, Effective, Safe, Accessible -Medications previously tried: none noted -Having some allergies as the seasons change.  Reports Singulair as  effective for symptoms.  She requests a refill on her Promethazine-DM syrup for a lingering cough she is having. -Recommended to continue current medication Will consult with PCP on appropriateness of Promethazine-DM refill.          Beverly Milch, PharmD Clinical Pharmacist  [facility] [phone]

## 2022-07-28 NOTE — Telephone Encounter (Signed)
Ok to order mammogram at [facility]- dx Z12.31.  Also [facility]- dx post menopausal estrogen deficiency

## 2022-07-29 ENCOUNTER — Other Ambulatory Visit: Payer: Self-pay

## 2022-07-29 DIAGNOSIS — Z780 Asymptomatic menopausal state: Secondary | ICD-10-CM

## 2022-07-29 DIAGNOSIS — Z1231 Encounter for screening mammogram for malignant neoplasm of breast: Secondary | ICD-10-CM

## 2022-07-29 NOTE — Telephone Encounter (Signed)
Orders have been placed.

## 2022-08-15 ENCOUNTER — Encounter: Payer: Self-pay | Admitting: Cardiology

## 2022-08-15 ENCOUNTER — Ambulatory Visit: Payer: PPO | Attending: Cardiology | Admitting: Cardiology

## 2022-08-15 VITALS — BP 170/100 | HR 72 | Ht 63.0 in | Wt 262.0 lb

## 2022-08-15 DIAGNOSIS — G4733 Obstructive sleep apnea (adult) (pediatric): Secondary | ICD-10-CM | POA: Diagnosis not present

## 2022-08-15 DIAGNOSIS — I10 Essential (primary) hypertension: Secondary | ICD-10-CM | POA: Diagnosis not present

## 2022-08-15 NOTE — Progress Notes (Signed)
Cardiology Office Note:    Date:  08/15/2022   ID:  Renee Flores, DOB May 03, 1965, MRN [id number]  PCP:  Sheliah Hatch, MD  Cardiologist:  Gypsy Balsam, MD    Referring MD: Sheliah Hatch, MD   Chief Complaint  Patient presents with   Follow-up  Doing well  History of Present Illness:    Renee Flores is a 57 y.o. female past medical history significant for essential hypertension, dyslipidemia mitral regurgitation was only trivial, palpitations, atypical chest pain, allergies.  Comes today to months for follow-up overall doing well.  Denies have any chest pain tightness squeezing pressure burning chest, she started walking and going to the gym.  Does it about twice a week.  She suffered tremendously from allergies there is a pollen season she does have a lot of congestion and cough and also asthma.  Denies having any worsening shortness of breath no swelling of lower extremities  Past Medical History:  Diagnosis Date   Allergy    Arthritis    Bulging lumbar disc    L4-5   Complication of anesthesia    anxiety attack after waking up from anesthesia   Factor 5 Leiden mutation, heterozygous    Factor V deficiency, congenital 03/23/2012   GERD (gastroesophageal reflux disease)    Heart murmur    Heart palpitations    Heart valve problem    mild- moderate mitral valve leakage    High cholesterol    HYPERCHOLESTEROLEMIA 02/27/2010   Qualifier: Diagnosis of  By: Lennox Laity RN, Denise     Hypersomnia with sleep apnea, unspecified 11/03/2013   Hypertension    Lumbar radiculopathy 08/12/2018   MS (multiple sclerosis)    questionable   Neuromuscular disorder    OSA on CPAP 03/25/2017   Osteopenia 04/2016   T score -1.9   Osteoporosis    Preop cardiovascular exam 03/23/2012   Retrognathia 11/03/2013   Right knee DJD 07/08/2012   Severe obesity (BMI >= 40) 11/03/2013   Shortness of breath 06/03/2013   Sleep apnea    Vitamin D deficiency 2014   17    Past Surgical  History:  Procedure Laterality Date   ABDOMINAL HYSTERECTOMY     ANKLE FUSION Left 04/25/2021   APPENDECTOMY     arthroscopic knee surgery     CESAREAN SECTION     x2   chloecystectomy     CHOLECYSTECTOMY     ESOPHAGEAL DILATION  04/09/2012   KNEE LIGAMENT RECONSTRUCTION     laser throat polyps     Left ankle reconstructed     NECK LESION BIOPSY     PATELLA FRACTURE SURGERY     PELVIC LAPAROSCOPY     LSO   REPLACEMENT TOTAL KNEE Left 2012   REPLACEMENT TOTAL KNEE Right 2014   TOE SURGERY     TONSILLECTOMY     TONSILLECTOMY  1979   TOTAL ABDOMINAL HYSTERECTOMY W/ BILATERAL SALPINGOOPHORECTOMY  2000   TOTAL KNEE ARTHROPLASTY Left 2012   TOTAL KNEE ARTHROPLASTY Right 07/08/2012   Procedure: TOTAL KNEE ARTHROPLASTY with revision components ;  Surgeon: Velna Ochs, MD;  Location: [facility] OR;  Service: Orthopedics;  Laterality: Right;  PATIENT HAS HX. OF FACTOR IV DISORFER   TUBAL LIGATION      Current Medications: Current Meds  Medication Sig   albuterol (VENTOLIN HFA) 108 (90 Base) MCG/ACT inhaler Inhale 2 puffs into the lungs every 6 (six) hours as needed for wheezing or shortness of breath.  aspirin EC 81 MG tablet Take 81 mg by mouth daily.   Calcium Carb-Cholecalciferol (OYSTER SHELL CALCIUM W/D) 500-5 MG-MCG TABS TAKE 1 TABLET EVERY DAY   cloNIDine (CATAPRES) 0.1 MG tablet TAKE 1 TABLET THREE TIMES DAILY   cyclobenzaprine (FLEXERIL) 5 MG tablet Take 1 tablet (5 mg total) by mouth 3 (three) times daily as needed for muscle spasms.   fluticasone (FLONASE) 50 MCG/ACT nasal spray Place 2 sprays into both nostrils daily. (Patient taking differently: Place 2 sprays into both nostrils daily as needed for allergies.)   fluticasone furoate-vilanterol (BREO ELLIPTA) 200-25 MCG/ACT AEPB Inhale 1 puff into the lungs daily.   gabapentin (NEURONTIN) 600 MG tablet TAKE 1 TABLET AT BEDTIME   ketotifen (ZADITOR) 0.025 % ophthalmic solution Place 2 drops into both eyes daily as needed (for  irritation).   losartan (COZAAR) 50 MG tablet Take 1 tablet (50 mg total) by mouth 2 (two) times daily.   meloxicam (MOBIC) 15 MG tablet TAKE 1 TABLET EVERY DAY (Patient taking differently: Take 15 mg by mouth daily.)   metoprolol succinate (TOPROL-XL) 50 MG 24 hr tablet Take 1 tablet (50 mg total) by mouth daily.   montelukast (SINGULAIR) 10 MG tablet TAKE 1 TABLET AT BEDTIME (Patient taking differently: Take 10 mg by mouth at bedtime.)   Multiple Vitamins-Minerals (ONE-A-DAY WOMENS 50+ ADVANTAGE PO) Take 1 tablet by mouth daily. Unknown strength   ofloxacin (FLOXIN OTIC) 0.3 % OTIC solution Place 5 drops into both ears daily.   omeprazole (PRILOSEC) 40 MG capsule TAKE 1 CAPSULE EVERY DAY   ondansetron (ZOFRAN) 4 MG tablet Take 1 tablet (4 mg total) by mouth every 8 (eight) hours as needed for nausea or vomiting.   promethazine-dextromethorphan (PROMETHAZINE-DM) 6.25-15 MG/5ML syrup Take 5 mLs by mouth 3 (three) times daily as needed for cough.   simvastatin (ZOCOR) 40 MG tablet TAKE 1 TABLET AT BEDTIME   spironolactone (ALDACTONE) 25 MG tablet Take 0.5 tablets (12.5 mg total) by mouth daily.     Allergies:   Hydrochlorothiazide, Morphine, Vancomycin, Erythromycin base, Erythromycin base, Vancomycin hcl, and Doxycycline   Social History   Socioeconomic History   Marital status: Widowed    Spouse name: [name]   Number of children: 2   Years of education: 12   Highest education level: Not on file  Occupational History    Employer: [employer]  Tobacco Use   Smoking status: Never    Passive exposure: Never   Smokeless tobacco: Never  Vaping Use   Vaping Use: Never used  Substance and Sexual Activity   Alcohol use: Not Currently    Alcohol/week: 0.0 standard drinks of alcohol   Drug use: No   Sexual activity: Yes    Birth control/protection: Surgical    Comment: HYST-1st intercourse 57 yo-Fewer than 5 partners  Other Topics Concern   Not on file  Social History Narrative    [name]  and lives at home with her husband.   Patient has two children.   Patient works as a Product/process development scientist for [employer].   Patient drinks one cup of caffeine daily.   Patient is right-handed.   Patient has a high school education.   Social Determinants of Health   Financial Resource Strain: Low Risk  (09/26/2021)   Overall Financial Resource Strain (CARDIA)    Difficulty of Paying Living Expenses: Not hard at all  Food Insecurity: No Food Insecurity (09/26/2021)   Hunger Vital Sign    Worried  About Running Out of Food in the Last Year: Never true    Ran Out of Food in the Last Year: Never true  Transportation Needs: No Transportation Needs (09/26/2021)   PRAPARE - Administrator, Civil Service (Medical): No    Lack of Transportation (Non-Medical): No  Physical Activity: Insufficiently Active (09/26/2021)   Exercise Vital Sign    Days of Exercise per Week: 3 days    Minutes of Exercise per Session: 30 min  Stress: No Stress Concern Present (09/26/2021)   [employer] of Occupational Health - Occupational Stress Questionnaire    Feeling of Stress : Not at all  Social Connections: Moderately Integrated (09/26/2021)   Social Connection and Isolation Panel [NHANES]    Frequency of Communication with Friends and Family: Three times a week    Frequency of Social Gatherings with Friends and Family: Three times a week    Attends Religious Services: More than 4 times per year    Active Member of Clubs or Organizations: Yes    Attends Banker Meetings: More than 4 times per year    Marital Status: Widowed     Family History: The patient's family history includes Breast cancer (age of onset: 42) in her paternal aunt; Breast cancer (age of onset: 42) in her paternal aunt; Cancer in her father; Colon cancer in her paternal aunt, paternal aunt, and paternal grandmother; Diabetes in her mother; Heart disease in her father and maternal grandfather;  Heart failure in her maternal grandfather; Hyperlipidemia in her brother, father, and mother; Hypertension in her brother, brother, father, mother, and sister; Lymphoma in her maternal grandfather; Other in her father; Stroke in her paternal grandfather. ROS:   Please see the history of present illness.    All 14 point review of systems negative except as described per history of present illness  EKGs/Labs/Other Studies Reviewed:      Recent Labs: 03/17/2022: ALT 49; BUN 11; Creatinine, Ser 0.73; Hemoglobin 12.7; Platelets 284.0; Potassium 4.1; Sodium 138; TSH 2.16  Recent Lipid Panel    Component Value Date/Time   CHOL 158 03/17/2022 0854   TRIG 186.0 (H) 03/17/2022 0854   HDL 48.80 03/17/2022 0854   CHOLHDL 3 03/17/2022 0854   VLDL 37.2 03/17/2022 0854   LDLCALC 72 03/17/2022 0854   LDLDIRECT 81.0 09/12/2021 0828    Physical Exam:    VS:  BP (!) 170/100 (BP Location: Right Arm, Patient Position: Sitting, Cuff Size: Normal)   Pulse 72   Ht  (1.6 m)   Wt 262 lb (118.8 kg)   SpO2 93%   BMI 46.41 kg/m     Wt Readings from Last 3 Encounters:  08/15/22 262 lb (118.8 kg)  07/15/22 260 lb (117.9 kg)  06/04/22 256 lb 9.6 oz (116.4 kg)     GEN:  Well nourished, well developed in no acute distress HEENT: Normal NECK: No JVD; No carotid bruits LYMPHATICS: No lymphadenopathy CARDIAC: RRR, no murmurs, no rubs, no gallops RESPIRATORY:  Clear to auscultation without rales, wheezing or rhonchi  ABDOMEN: Soft, non-tender, non-distended MUSCULOSKELETAL:  No edema; No deformity  SKIN: Warm and dry LOWER EXTREMITIES: no swelling NEUROLOGIC:  Alert and oriented x 3 PSYCHIATRIC:  Normal affect   ASSESSMENT:    1. Primary hypertension   2. OSA on CPAP   3. Morbidly obese    PLAN:    In order of problems listed above:  Essential hip tension seems to be uncontrolled today I wanted to  increase the dose of medication but she tells me she check it at home and she does have an  arm blood pressure monitor which has been verified and accurate she got blood pressure between 120 and 135 systolic no more than that.  Therefore we will continue monitoring. Obstructive sleep apnea is using CPAP mask. Morbidly obese obesity problem she understand that she is try to go to gym hopefully will build to lose some weight.   Medication Adjustments/Labs and Tests Ordered: Current medicines are reviewed at length with the patient today.  Concerns regarding medicines are outlined above.  No orders of the defined types were placed in this encounter.  Medication changes: No orders of the defined types were placed in this encounter.   Signed, Georgeanna Lea, MD, [facility] 08/15/2022 4:23 PM    [facility]

## 2022-08-15 NOTE — Patient Instructions (Signed)

## 2022-08-25 ENCOUNTER — Encounter: Payer: Self-pay | Admitting: Family Medicine

## 2022-08-25 MED ORDER — GABAPENTIN 300 MG PO CAPS: 600.0000 mg | ORAL_CAPSULE | Freq: Every day | ORAL | 1 refills | Status: DC

## 2022-08-26 ENCOUNTER — Telehealth: Payer: Self-pay

## 2022-08-26 ENCOUNTER — Other Ambulatory Visit: Payer: Self-pay

## 2022-08-26 MED ORDER — OYSTER SHELL CALCIUM W/D 500-5 MG-MCG PO TABS: 1.0000 | ORAL_TABLET | Freq: Every day | ORAL | 1 refills | Status: DC

## 2022-08-26 NOTE — Telephone Encounter (Signed)
Over looked sorry . Refill sent in

## 2022-08-26 NOTE — Telephone Encounter (Signed)
We received a request from [facility] for a refill on pt Rx Oyster Shell/ Vit d . I called pt to verify she was using them she states yes . Looking thru her med list this medication is not on here . She states Dr Beverely Low gave her the Rx . Can you advise ?  [facility]  [address] [facility] [place] ,[place] [address] Fax number [phone]

## 2022-08-26 NOTE — Telephone Encounter (Signed)
It's on her med list- 4th from the top- and ok to refill

## 2022-09-15 ENCOUNTER — Ambulatory Visit
Admission: RE | Admit: 2022-09-15 | Discharge: 2022-09-15 | Disposition: A | Discharge status: Home / Self Care | Payer: PPO | Source: Ambulatory Visit | Attending: Family Medicine | Admitting: Family Medicine

## 2022-09-15 DIAGNOSIS — Z1231 Encounter for screening mammogram for malignant neoplasm of breast: Secondary | ICD-10-CM

## 2022-09-16 ENCOUNTER — Encounter: Payer: Self-pay | Admitting: Family Medicine

## 2022-09-16 ENCOUNTER — Ambulatory Visit (INDEPENDENT_AMBULATORY_CARE_PROVIDER_SITE_OTHER): Payer: PPO | Admitting: Family Medicine

## 2022-09-16 VITALS — BP 128/82 | HR 82 | Temp 97.9°F | Resp 17 | Ht 63.0 in | Wt 263.5 lb

## 2022-09-16 DIAGNOSIS — Z0000 Encounter for general adult medical examination without abnormal findings: Secondary | ICD-10-CM

## 2022-09-16 DIAGNOSIS — Z13220 Encounter for screening for lipoid disorders: Secondary | ICD-10-CM | POA: Diagnosis not present

## 2022-09-16 DIAGNOSIS — Z6841 Body Mass Index (BMI) 40.0 and over, adult: Secondary | ICD-10-CM

## 2022-09-16 DIAGNOSIS — E559 Vitamin D deficiency, unspecified: Secondary | ICD-10-CM | POA: Diagnosis not present

## 2022-09-16 LAB — CBC WITH DIFFERENTIAL/PLATELET
Basophils Absolute: 0.1 10*3/uL (ref 0.0–0.1)
Basophils Relative: 1.2 % (ref 0.0–3.0)
Eosinophils Absolute: 0.4 10*3/uL (ref 0.0–0.7)
Eosinophils Relative: 5.6 % — ABNORMAL HIGH (ref 0.0–5.0)
HCT: 38.7 % (ref 36.0–46.0)
Hemoglobin: 12.1 g/dL (ref 12.0–15.0)
Lymphocytes Relative: 39.4 % (ref 12.0–46.0)
Lymphs Abs: 2.7 10*3/uL (ref 0.7–4.0)
MCHC: 31.4 g/dL (ref 30.0–36.0)
MCV: 85.5 fl (ref 78.0–100.0)
Monocytes Absolute: 0.6 10*3/uL (ref 0.1–1.0)
Monocytes Relative: 8.3 % (ref 3.0–12.0)
Neutro Abs: 3.1 10*3/uL (ref 1.4–7.7)
Neutrophils Relative %: 45.5 % (ref 43.0–77.0)
Platelets: 272 10*3/uL (ref 150.0–400.0)
RBC: 4.52 Mil/uL (ref 3.87–5.11)
RDW: 13.8 % (ref 11.5–15.5)
WBC: 6.8 10*3/uL (ref 4.0–10.5)

## 2022-09-16 LAB — VITAMIN D 25 HYDROXY (VIT D DEFICIENCY, FRACTURES): VITD: 29.72 ng/mL — ABNORMAL LOW (ref 30.00–100.00)

## 2022-09-16 LAB — LIPID PANEL
Cholesterol: 177 mg/dL (ref 0–200)
HDL: 57.8 mg/dL (ref >39.00)
NonHDL: 119.03
Total CHOL/HDL Ratio: 3
Triglycerides: 256 mg/dL — ABNORMAL HIGH (ref 0.0–149.0)
VLDL: 51.2 mg/dL — ABNORMAL HIGH (ref 0.0–40.0)

## 2022-09-16 LAB — LDL CHOLESTEROL, DIRECT: Direct LDL: 84 mg/dL

## 2022-09-16 LAB — HEPATIC FUNCTION PANEL
ALT: 29 U/L (ref 0–35)
AST: 24 U/L (ref 0–37)
Albumin: 4.3 g/dL (ref 3.5–5.2)
Alkaline Phosphatase: 81 U/L (ref 39–117)
Bilirubin, Direct: 0.1 mg/dL (ref 0.0–0.3)
Total Bilirubin: 0.4 mg/dL (ref 0.2–1.2)
Total Protein: 7.7 g/dL (ref 6.0–8.3)

## 2022-09-16 LAB — BASIC METABOLIC PANEL
BUN: 17 mg/dL (ref 6–23)
CO2: 28 mEq/L (ref 19–32)
Calcium: 9.7 mg/dL (ref 8.4–10.5)
Chloride: 99 mEq/L (ref 96–112)
Creatinine, Ser: 0.75 mg/dL (ref 0.40–1.20)
GFR: 89.36 mL/min (ref >60.00)
Glucose, Bld: 112 mg/dL — ABNORMAL HIGH (ref 70–99)
Potassium: 4.4 mEq/L (ref 3.5–5.1)
Sodium: 137 mEq/L (ref 135–145)

## 2022-09-16 LAB — TSH: TSH: 1.95 u[IU]/mL (ref 0.35–5.50)

## 2022-09-16 LAB — HEMOGLOBIN A1C: Hgb A1c MFr Bld: 6.1 % (ref 4.6–6.5)

## 2022-09-16 NOTE — Progress Notes (Signed)
   Subjective:    Patient ID: Renee Flores, female    DOB: 1966-01-01, 57 y.o.   MRN: [id number]  HPI CPE- UTD on mammo (done yesterday), colonoscopy, Tdap  Patient Care Team    Relationship Specialty Notifications Start End  Sheliah Hatch, MD PCP - General Family Medicine  04/13/20   Georgeanna Lea, MD PCP - Cardiology Cardiology  04/30/20   Dohmeier, Porfirio Mylar, MD Consulting Physician Neurology  09/11/20   Lenn Sink, [place] Consulting Physician Podiatry  09/11/20   Terance Hart, MD Consulting Physician Orthopedic Surgery  09/11/20   Erroll Luna, [facility] Pharmacist Pharmacist  06/17/21    Comment: [phone]    Health Maintenance  Topic Date Due   MAMMOGRAM  05/02/2020   Medicare Annual Wellness (AWV)  09/27/2022   INFLUENZA VACCINE  11/27/2022   COLONOSCOPY (Pts 45-48yrs Insurance coverage will need to be confirmed)  08/04/2029   DTaP/Tdap/Td (3 - Td or Tdap) 09/13/2031   Hepatitis C Screening  Completed   HIV Screening  Completed   HPV VACCINES  Aged Out   COVID-19 Vaccine  Discontinued   Zoster Vaccines- Shingrix  Discontinued      Review of Systems Patient reports no vision changes, adenopathy,fever, weight change,  persistant/recurrent hoarseness , swallowing issues, chest pain, palpitations, edema, persistant/recurrent cough, hemoptysis, dyspnea (rest/exertional/paroxysmal nocturnal), gastrointestinal bleeding (melena, rectal bleeding), abdominal pain, significant heartburn, bowel changes, GU symptoms (dysuria, hematuria, incontinence), Gyn symptoms (abnormal  bleeding, pain),  syncope, focal weakness, memory loss, numbness & tingling, skin/hair/nail changes, abnormal bruising or bleeding, anxiety, or depression.   + bilateral hearing loss, R>L    Objective:   Physical Exam General Appearance:    Alert, cooperative, no distress, appears stated age, obese  Head:    Normocephalic, without obvious abnormality, atraumatic  Eyes:    PERRL,  conjunctiva/corneas clear, EOM's intact both eyes  Ears:    Normal TM's and external ear canals, both ears  Nose:   Nares normal, septum midline, mucosa normal, no drainage    or sinus tenderness  Throat:   Lips, mucosa, and tongue normal; teeth and gums normal  Neck:   Supple, symmetrical, trachea midline, no adenopathy;    Thyroid: no enlargement/tenderness/nodules  Back:     Symmetric, no curvature, ROM normal, no CVA tenderness  Lungs:     Clear to auscultation bilaterally, respirations unlabored  Chest Wall:    No tenderness or deformity   Heart:    Regular rate and rhythm, S1 and S2 normal, no murmur, rub   or gallop  Breast Exam:    Deferred to GYN  Abdomen:     Soft, non-tender, bowel sounds active all four quadrants,    no masses, no organomegaly  Genitalia:    Deferred to GYN  Rectal:    Extremities:   Extremities normal, atraumatic, no cyanosis or edema  Pulses:   2+ and symmetric all extremities  Skin:   Skin color, texture, turgor normal, no rashes or lesions  Lymph nodes:   Cervical, supraclavicular, and axillary nodes normal  Neurologic:   CNII-XII intact, normal strength, sensation and reflexes    throughout          Assessment & Plan:

## 2022-09-16 NOTE — Assessment & Plan Note (Signed)
Ongoing issue for pt.  Current BMI 46.68  She has started going to the gym regularly- applauded her efforts.  Encouraged low carb diet.  Check labs to risk stratify.  Will follow.

## 2022-09-16 NOTE — Patient Instructions (Signed)
Follow up in 6 months to recheck blood pressure, cholesterol, and weight loss progress We'll notify you of your lab results and make any changes if needed Continue to work on healthy diet and regular exercise- you can do it! Call with any questions or concerns Stay Safe!  Stay Healthy! Have a great summer!!!

## 2022-09-16 NOTE — Assessment & Plan Note (Signed)
Pt's PE WNL w/ exception of BMI.  UTD on mammo, colonoscopy, Tdap.  Check labs.  Anticipatory guidance provided.  

## 2022-09-16 NOTE — Assessment & Plan Note (Signed)
Check labs and replete prn. 

## 2022-09-17 ENCOUNTER — Other Ambulatory Visit: Payer: Self-pay

## 2022-09-17 ENCOUNTER — Telehealth: Payer: Self-pay

## 2022-09-17 DIAGNOSIS — E559 Vitamin D deficiency, unspecified: Secondary | ICD-10-CM

## 2022-09-17 MED ORDER — VITAMIN D (ERGOCALCIFEROL) 1.25 MG (50000 UNIT) PO CAPS: 50000.0000 [IU] | ORAL_CAPSULE | ORAL | 12 refills | Status: DC

## 2022-09-17 NOTE — Telephone Encounter (Signed)
Duplicate

## 2022-09-30 ENCOUNTER — Telehealth: Payer: Self-pay

## 2022-09-30 ENCOUNTER — Other Ambulatory Visit: Payer: Self-pay

## 2022-09-30 DIAGNOSIS — M5416 Radiculopathy, lumbar region: Secondary | ICD-10-CM

## 2022-09-30 DIAGNOSIS — T7840XA Allergy, unspecified, initial encounter: Secondary | ICD-10-CM

## 2022-09-30 MED ORDER — MONTELUKAST SODIUM 10 MG PO TABS: 10.0000 mg | ORAL_TABLET | Freq: Every day | ORAL | 0 refills | Status: DC

## 2022-09-30 MED ORDER — MELOXICAM 15 MG PO TABS: 15.0000 mg | ORAL_TABLET | Freq: Every day | ORAL | 0 refills | Status: DC

## 2022-09-30 NOTE — Telephone Encounter (Signed)
Refilled and sent to mail in pharmacy.

## 2022-09-30 NOTE — Telephone Encounter (Signed)
Pt needs refill on Meloxicam 15 mg tablet as well as Montelukast 10 mg please note pt has a new mail order pharmacy  [facility] mail order pharmacy

## 2022-09-30 NOTE — Telephone Encounter (Signed)
Ok to send 90 days of each w/ 1 refill to new mail order pharmacy

## 2022-10-02 ENCOUNTER — Ambulatory Visit (INDEPENDENT_AMBULATORY_CARE_PROVIDER_SITE_OTHER): Payer: PPO | Admitting: *Deleted

## 2022-10-02 DIAGNOSIS — Z0000 Encounter for general adult medical examination without abnormal findings: Secondary | ICD-10-CM

## 2022-10-02 NOTE — Patient Instructions (Signed)
Renee Flores , Thank you for taking time to come for your Medicare Wellness Visit. I appreciate your ongoing commitment to your health goals. Please review the following plan we discussed and let me know if I can assist you in the future.   Screening recommendations/referrals: Colonoscopy: up to date Mammogram: up to date  Recommended yearly ophthalmology/optometry visit for glaucoma screening and checkup Recommended yearly dental visit for hygiene and checkup  Vaccinations: Influenza vaccine: up to date Tdap vaccine: up to date Shingles vaccine: Education provided    Advanced directives: Education provided    Preventive Care , Female Preventive care refers to lifestyle choices and visits with your health care provider that can promote health and wellness. What does preventive care include? A yearly physical exam. This is also called an annual well check. Dental exams once or twice a year. Routine eye exams. Ask your health care provider how often you should have your eyes checked. Personal lifestyle choices, including: Daily care of your teeth and gums. Regular physical activity. Eating a healthy diet. Avoiding tobacco and drug use. Limiting alcohol use. Practicing safe sex. Taking low-dose aspirin every day. Taking vitamin and mineral supplements as recommended by your health care provider. What happens during an annual well check? The services and screenings done by your health care provider during your annual well check will depend on your age, overall health, lifestyle risk factors, and family history of disease. Counseling  Your health care provider may ask you questions about your: Alcohol use. Tobacco use. Drug use. Emotional well-being. Home and relationship well-being. Sexual activity. Eating habits. History of falls. Memory and ability to understand (cognition). Work and work Astronomer. Reproductive health. Screening  You may have the following tests or  measurements: Height, weight, and BMI. Blood pressure. Lipid and cholesterol levels. These may be checked every 5 years, or more frequently if you are over 33 years old. Skin check. Lung cancer screening. You may have this screening every year starting at age 46 if you have a 30-pack-year history of smoking and currently smoke or have quit within the past 15 years. Fecal occult blood test (FOBT) of the stool. You may have this test every year starting at age 100. Flexible sigmoidoscopy or colonoscopy. You may have a sigmoidoscopy every 5 years or a colonoscopy every 10 years starting at age 73. Hepatitis C blood test. Hepatitis B blood test. Sexually transmitted disease (STD) testing. Diabetes screening. This is done by checking your blood sugar (glucose) after you have not eaten for a while (fasting). You may have this done every 1-3 years. Bone density scan. This is done to screen for osteoporosis. You may have this done starting at age 39. Mammogram. This may be done every 1-2 years. Talk to your health care provider about how often you should have regular mammograms. Talk with your health care provider about your test results, treatment options, and if necessary, the need for more tests. Vaccines  Your health care provider may recommend certain vaccines, such as: Influenza vaccine. This is recommended every year. Tetanus, diphtheria, and acellular pertussis (Tdap, Td) vaccine. You may need a Td booster every 10 years. Zoster vaccine. You may need this after age 73. Pneumococcal 13-valent conjugate (PCV13) vaccine. One dose is recommended after age 60. Pneumococcal polysaccharide (PPSV23) vaccine. One dose is recommended after age 35. Talk to your health care provider about which screenings and vaccines you need and how often you need them. This information is not intended to replace advice given  to you by your health care provider. Make sure you discuss any questions you have with your  health care provider. Document Released: 05/11/2015 Document Revised: 01/02/2016 Document Reviewed: 02/13/2015 Elsevier Interactive Patient Education  2017 [employer].  Fall Prevention in the Home Falls can cause injuries. They can happen to people of all ages. There are many things you can do to make your home safe and to help prevent falls. What can I do on the outside of my home? Regularly fix the edges of walkways and driveways and fix any cracks. Remove anything that might make you trip as you walk through a door, such as a raised step or threshold. Trim any bushes or trees on the path to your home. Use bright outdoor lighting. Clear any walking paths of anything that might make someone trip, such as rocks or tools. Regularly check to see if handrails are loose or broken. Make sure that both sides of any steps have handrails. Any raised decks and porches should have guardrails on the edges. Have any leaves, snow, or ice cleared regularly. Use sand or salt on walking paths during winter. Clean up any spills in your garage right away. This includes oil or grease spills. What can I do in the bathroom? Use night lights. Install grab bars by the toilet and in the tub and shower. Do not use towel bars as grab bars. Use non-skid mats or decals in the tub or shower. If you need to sit down in the shower, use a plastic, non-slip stool. Keep the floor dry. Clean up any water that spills on the floor as soon as it happens. Remove soap buildup in the tub or shower regularly. Attach bath mats securely with double-sided non-slip rug tape. Do not have throw rugs and other things on the floor that can make you trip. What can I do in the bedroom? Use night lights. Make sure that you have a light by your bed that is easy to reach. Do not use any sheets or blankets that are too big for your bed. They should not hang down onto the floor. Have a firm chair that has side arms. You can use this for  support while you get dressed. Do not have throw rugs and other things on the floor that can make you trip. What can I do in the kitchen? Clean up any spills right away. Avoid walking on wet floors. Keep items that you use a lot in easy-to-reach places. If you need to reach something above you, use a strong step stool that has a grab bar. Keep electrical cords out of the way. Do not use floor polish or wax that makes floors slippery. If you must use wax, use non-skid floor wax. Do not have throw rugs and other things on the floor that can make you trip. What can I do with my stairs? Do not leave any items on the stairs. Make sure that there are handrails on both sides of the stairs and use them. Fix handrails that are broken or loose. Make sure that handrails are as long as the stairways. Check any carpeting to make sure that it is firmly attached to the stairs. Fix any carpet that is loose or worn. Avoid having throw rugs at the top or bottom of the stairs. If you do have throw rugs, attach them to the floor with carpet tape. Make sure that you have a light switch at the top of the stairs and the bottom of the  stairs. If you do not have them, ask someone to add them for you. What else can I do to help prevent falls? Wear shoes that: Do not have high heels. Have rubber bottoms. Are comfortable and fit you well. Are closed at the toe. Do not wear sandals. If you use a stepladder: Make sure that it is fully opened. Do not climb a closed stepladder. Make sure that both sides of the stepladder are locked into place. Ask someone to hold it for you, if possible. Clearly mark and make sure that you can see: Any grab bars or handrails. First and last steps. Where the edge of each step is. Use tools that help you move around (mobility aids) if they are needed. These include: Canes. Walkers. Scooters. Crutches. Turn on the lights when you go into a dark area. Replace any light bulbs as soon  as they burn out. Set up your furniture so you have a clear path. Avoid moving your furniture around. If any of your floors are uneven, fix them. If there are any pets around you, be aware of where they are. Review your medicines with your doctor. Some medicines can make you feel dizzy. This can increase your chance of falling. Ask your doctor what other things that you can do to help prevent falls. This information is not intended to replace advice given to you by your health care provider. Make sure you discuss any questions you have with your health care provider. Document Released: 02/08/2009 Document Revised: 09/20/2015 Document Reviewed: 05/19/2014 Elsevier Interactive Patient Education  2017 [employer].

## 2022-10-02 NOTE — Progress Notes (Signed)
Subjective:   Renee Flores is a 57 y.o. female who presents for Medicare Annual (Subsequent) preventive examination.  I connected with  AZENETH KOST on 10/02/22 by a telephone enabled telemedicine application and verified that I am speaking with the correct person using two identifiers.   I discussed the limitations of evaluation and management by telemedicine. The patient expressed understanding and agreed to proceed.  Patient location: home  Provider location: telephone/home    Review of Systems     Cardiac Risk Factors include: advanced age (>52men, >71 women);family history of premature cardiovascular disease;hypertension;obesity (BMI >30kg/m2)     Objective:    Today's Vitals   10/02/22 1302  PainSc: 4    There is no height or weight on file to calculate BMI.     10/02/2022    1:05 PM 09/26/2021    2:08 PM 04/27/2021   10:22 PM 07/30/2020    9:10 AM 07/01/2012    3:41 PM  Advanced Directives  Does Patient Have a Medical Advance Directive? No No No No Patient does not have advance directive;Patient would not like information  Does patient want to make changes to medical advance directive?    Yes (MAU/Ambulatory/Procedural Areas - Information given)   Would patient like information on creating a medical advance directive? No - Patient declined No - Patient declined Yes (ED - Information included in AVS)      Current Medications (verified) Outpatient Encounter Medications as of 10/02/2022  Medication Sig   aspirin EC 81 MG tablet Take 81 mg by mouth daily.   Calcium Carb-Cholecalciferol (OYSTER SHELL CALCIUM W/D) 500-5 MG-MCG TABS Take 1 tablet by mouth daily.   cloNIDine (CATAPRES) 0.1 MG tablet TAKE 1 TABLET THREE TIMES DAILY   cyclobenzaprine (FLEXERIL) 5 MG tablet Take 1 tablet (5 mg total) by mouth 3 (three) times daily as needed for muscle spasms.   fluticasone (FLONASE) 50 MCG/ACT nasal spray Place 2 sprays into both nostrils daily. (Patient taking differently:  Place 2 sprays into both nostrils daily as needed for allergies.)   fluticasone furoate-vilanterol (BREO ELLIPTA) 200-25 MCG/ACT AEPB Inhale 1 puff into the lungs daily.   gabapentin (NEURONTIN) 300 MG capsule Take 2 capsules (600 mg total) by mouth at bedtime.   ketotifen (ZADITOR) 0.025 % ophthalmic solution Place 2 drops into both eyes daily as needed (for irritation).   losartan (COZAAR) 50 MG tablet Take 1 tablet (50 mg total) by mouth 2 (two) times daily.   meloxicam (MOBIC) 15 MG tablet Take 1 tablet (15 mg total) by mouth daily.   metoprolol succinate (TOPROL-XL) 50 MG 24 hr tablet Take 1 tablet (50 mg total) by mouth daily.   montelukast (SINGULAIR) 10 MG tablet Take 1 tablet (10 mg total) by mouth at bedtime.   Multiple Vitamins-Minerals (ONE-A-DAY WOMENS 50+ ADVANTAGE PO) Take 1 tablet by mouth daily. Unknown strength   omeprazole (PRILOSEC) 40 MG capsule TAKE 1 CAPSULE EVERY DAY   ondansetron (ZOFRAN) 4 MG tablet Take 1 tablet (4 mg total) by mouth every 8 (eight) hours as needed for nausea or vomiting.   simvastatin (ZOCOR) 40 MG tablet TAKE 1 TABLET AT BEDTIME   spironolactone (ALDACTONE) 25 MG tablet Take 0.5 tablets (12.5 mg total) by mouth daily.   No facility-administered encounter medications on file as of 10/02/2022.    Allergies (verified) Hydrochlorothiazide, Morphine, Vancomycin, Erythromycin base, Erythromycin base, Vancomycin hcl, and Doxycycline   History: Past Medical History:  Diagnosis Date   Allergy  Arthritis    Bulging lumbar disc    L4-5   Complication of anesthesia    anxiety attack after waking up from anesthesia   Factor 5 Leiden mutation, heterozygous (HCC)    Factor V deficiency, congenital (HCC) 03/23/2012   GERD (gastroesophageal reflux disease)    Heart murmur    Heart palpitations    Heart valve problem    mild- moderate mitral valve leakage    High cholesterol    HYPERCHOLESTEROLEMIA 02/27/2010   Qualifier: Diagnosis of  By: Lennox Laity RN,  Denise     Hypersomnia with sleep apnea, unspecified 11/03/2013   Hypertension    Lumbar radiculopathy 08/12/2018   MS (multiple sclerosis) (HCC)    questionable   Neuromuscular disorder (HCC)    OSA on CPAP 03/25/2017   Osteopenia 04/2016   T score -1.9   Osteoporosis    Preop cardiovascular exam 03/23/2012   Retrognathia 11/03/2013   Right knee DJD 07/08/2012   Severe obesity (BMI >= 40) (HCC) 11/03/2013   Shortness of breath 06/03/2013   Sleep apnea    Vitamin D deficiency 2014   17   Past Surgical History:  Procedure Laterality Date   ABDOMINAL HYSTERECTOMY     ANKLE FUSION Left 04/25/2021   APPENDECTOMY     arthroscopic knee surgery     CESAREAN SECTION     x2   chloecystectomy     CHOLECYSTECTOMY     ESOPHAGEAL DILATION  04/09/2012   KNEE LIGAMENT RECONSTRUCTION     laser throat polyps     Left ankle reconstructed     NECK LESION BIOPSY     PATELLA FRACTURE SURGERY     PELVIC LAPAROSCOPY     LSO   REPLACEMENT TOTAL KNEE Left 2012   REPLACEMENT TOTAL KNEE Right 2014   TOE SURGERY     TONSILLECTOMY     TONSILLECTOMY  1979   TOTAL ABDOMINAL HYSTERECTOMY W/ BILATERAL SALPINGOOPHORECTOMY  2000   TOTAL KNEE ARTHROPLASTY Left 2012   TOTAL KNEE ARTHROPLASTY Right 07/08/2012   Procedure: TOTAL KNEE ARTHROPLASTY with revision components ;  Surgeon: Velna Ochs, MD;  Location: [facility] OR;  Service: Orthopedics;  Laterality: Right;  PATIENT HAS HX. OF FACTOR IV DISORFER   TUBAL LIGATION     Family History  Problem Relation Age of Onset   Heart disease Father    Hypertension Father    Hyperlipidemia Father    Cancer Father    Other Father        Respiratory problems/Factor 5 disorder/1 bypass, 2 valve replacement (mitral and aortic)    Hypertension Mother    Diabetes Mother    Hyperlipidemia Mother    Hypertension Sister    Colon cancer Paternal Aunt    Breast cancer Paternal Aunt 50   Heart disease Maternal Grandfather    Lymphoma Maternal Grandfather    Heart failure  Maternal Grandfather    Colon cancer Paternal Grandmother    Hyperlipidemia Brother    Hypertension Brother    Hypertension Brother    Colon cancer Paternal Aunt    Breast cancer Paternal Aunt 82   Stroke Paternal Grandfather    Social History   Socioeconomic History   Marital status: Widowed    Spouse name: [name]   Number of children: 2   Years of education: 12   Highest education level: Not on file  Occupational History    Employer: [employer]  Tobacco Use   Smoking status: Never  Passive exposure: Never   Smokeless tobacco: Never  Vaping Use   Vaping Use: Never used  Substance and Sexual Activity   Alcohol use: Not Currently    Alcohol/week: 0.0 standard drinks of alcohol   Drug use: No   Sexual activity: Yes    Birth control/protection: Surgical    Comment: HYST-1st intercourse 57 yo-Fewer than 5 partners  Other Topics Concern   Not on file  Social History Narrative   [name]  and lives at home with her husband.   Patient has two children.   Patient works as a Product/process development scientist for [employer].   Patient drinks one cup of caffeine daily.   Patient is right-handed.   Patient has a high school education.   Social Determinants of Health   Financial Resource Strain: Low Risk  (10/02/2022)   Overall Financial Resource Strain (CARDIA)    Difficulty of Paying Living Expenses: Not hard at all  Food Insecurity: No Food Insecurity (10/02/2022)   Hunger Vital Sign    Worried About Running Out of Food in the Last Year: Never true    Ran Out of Food in the Last Year: Never true  Transportation Needs: No Transportation Needs (10/02/2022)   PRAPARE - Administrator, Civil Service (Medical): No    Lack of Transportation (Non-Medical): No  Physical Activity: Sufficiently Active (10/02/2022)   Exercise Vital Sign    Days of Exercise per Week: 4 days    Minutes of Exercise per Session: 60 min  Stress: No Stress Concern Present (10/02/2022)    [employer] of Occupational Health - Occupational Stress Questionnaire    Feeling of Stress : Not at all  Social Connections: Moderately Integrated (10/02/2022)   Social Connection and Isolation Panel [NHANES]    Frequency of Communication with Friends and Family: More than three times a week    Frequency of Social Gatherings with Friends and Family: More than three times a week    Attends Religious Services: More than 4 times per year    Active Member of [employer] or Organizations: No    Attends Engineer, structural: More than 4 times per year    Marital Status: Widowed    Tobacco Counseling Counseling given: Not Answered   Clinical Intake:  Pre-visit preparation completed: Yes  Pain : 0-10 Pain Score: 4  Pain Location: Hand Pain Descriptors / Indicators: Burning, Aching, Dull Pain Frequency: Constant Pain Relieving Factors: meloxicam  Pain Relieving Factors: meloxicam  Diabetes: No     Diabetic?  no  Interpreter Needed?: No  Information entered by :: Remi Haggard LPN   Activities of Daily Living    10/02/2022    1:07 PM 09/16/2022    8:16 AM  In your present state of health, do you have any difficulty performing the following activities:  Hearing? 1 0  Vision? 0 0  Difficulty concentrating or making decisions? 0 0  Walking or climbing stairs? 1 0  Dressing or bathing? 0 0  Doing errands, shopping? 0 0  Preparing Food and eating ? N   Using the Toilet? N   In the past six months, have you accidently leaked urine? N   Do you have problems with loss of bowel control? N   Managing your Medications? N   Managing your Finances? N   Housekeeping or managing your Housekeeping? N     Patient Care Team: Sheliah Hatch, MD as PCP - General (Family Medicine) Gypsy Balsam  J, MD as PCP - Cardiology (Cardiology) Dohmeier, Porfirio Mylar, MD as Consulting Physician (Neurology) Regal, Kirstie Peri, DPM as Consulting Physician (Podiatry) Terance Hart,  MD as Consulting Physician (Orthopedic Surgery) Erroll Luna, [facility] as Pharmacist (Pharmacist)  Indicate any recent Medical Services you may have received from other than [facility] providers in the past year (date may be approximate).     Assessment:   This is a routine wellness examination for Jacquette.  Hearing/Vision screen Hearing Screening - Comments:: Hearing loss both ears more in Right than Left No hearing aids Vision Screening - Comments:: Up to date [facility]  Dietary issues and exercise activities discussed: Current Exercise Habits: Structured exercise class, Type of exercise: treadmill;strength training/weights, Time (Minutes): 60, Frequency (Times/Week): 4, Weekly Exercise (Minutes/Week): 240, Intensity: Moderate, Exercise limited by: orthopedic condition(s)   Goals Addressed             This Visit's Progress    Weight (lb) < 200 lb (90.7 kg)         Depression Screen    10/02/2022    1:10 PM 09/16/2022    8:16 AM 05/08/2022   11:22 AM 03/06/2022   11:18 AM 01/06/2022    8:33 AM 12/24/2021    8:39 AM 09/26/2021    2:05 PM  PHQ 2/9 Scores  PHQ - 2 Score 0 0 0 0 0 3 0  PHQ- 9 Score 4 2 0 5 0 4     Fall Risk    10/02/2022    1:03 PM 09/16/2022    8:16 AM 05/08/2022   11:23 AM 03/06/2022   11:18 AM 01/06/2022    8:34 AM  Fall Risk   Falls in the past year? 0 0 0 0 0  Number falls in past yr: 0 0 0  0  Injury with Fall? 0 0 0  0  Risk for fall due to :  No Fall Risks No Fall Risks No Fall Risks No Fall Risks  Follow up Falls evaluation completed;Education provided;Falls prevention discussed Falls evaluation completed Falls evaluation completed Falls evaluation completed Falls evaluation completed    FALL RISK PREVENTION PERTAINING TO THE HOME:  Any stairs in or around the home? Yes  If so, are there any without handrails? No  Home free of loose throw rugs in walkways, pet beds, electrical cords, etc? Yes  Adequate lighting in your home to reduce risk of falls? Yes    ASSISTIVE DEVICES UTILIZED TO PREVENT FALLS:  Life alert? No  Use of a cane, walker or w/c? No  Grab bars in the bathroom? Yes  Shower chair or bench in shower? Yes  Elevated toilet seat or a handicapped toilet? No   TIMED UP AND GO:  Was the test performed? No .    Cognitive Function:        10/02/2022    1:06 PM  6CIT Screen  What Year? 0 points  What month? 0 points  What time? 0 points  Count back from 20 0 points  Months in reverse 0 points  Repeat phrase 0 points  Total Score 0 points    Immunizations Immunization History  Administered Date(s) Administered   Moderna Sars-Covid-2 Vaccination 08/05/2019, 09/01/2019, 04/05/2020   Tdap 04/28/2006, 09/12/2021    TDAP status: Up to date  Flu Vaccine status: Up to date    Covid-19 vaccine status: Information provided on how to obtain vaccines.   Qualifies for Shingles Vaccine? Yes   Zostavax completed No   Shingrix Completed?:  No.    Education has been provided regarding the importance of this vaccine. Patient has been advised to call insurance company to determine out of pocket expense if they have not yet received this vaccine. Advised may also receive vaccine at local pharmacy or Health Dept. Verbalized acceptance and understanding.  Screening Tests Health Maintenance  Topic Date Due   INFLUENZA VACCINE  11/27/2022   MAMMOGRAM  09/15/2023   Medicare Annual Wellness (AWV)  10/02/2023   Colonoscopy  08/04/2029   DTaP/Tdap/Td (3 - Td or Tdap) 09/13/2031   Hepatitis C Screening  Completed   HIV Screening  Completed   HPV VACCINES  Aged Out   COVID-19 Vaccine  Discontinued   Zoster Vaccines- Shingrix  Discontinued    Health Maintenance  There are no preventive care reminders to display for this patient.   Colorectal cancer screening: Type of screening: Colonoscopy. Completed 2021. Repeat every 10 years  Mammogram status: Completed  . Repeat every year    Lung Cancer Screening: (Low Dose CT  Chest recommended if Age 92-80 years, 30 pack-year currently smoking OR have quit w/in 15years.) does not qualify.   Lung Cancer Screening Referral:   Additional Screening:  Hepatitis C Screening: does not qualify; Completed 2022  Vision Screening: Recommended annual ophthalmology exams for early detection of glaucoma and other disorders of the eye. Is the patient up to date with their annual eye exam?  Yes  Who is the provider or what is the name of the office in which the patient attends annual eye exams? [facility] If pt is not established with a provider, would they like to be referred to a provider to establish care? No .   Dental Screening: Recommended annual dental exams for proper oral hygiene  Community Resource Referral / Chronic Care Management: CRR required this visit?  No   CCM required this visit?  No      Plan:     I have personally reviewed and noted the following in the patient's chart:   Medical and social history Use of alcohol, tobacco or illicit drugs  Current medications and supplements including opioid prescriptions. Patient is not currently taking opioid prescriptions. Functional ability and status Nutritional status Physical activity Advanced directives List of other physicians Hospitalizations, surgeries, and ER visits in previous 12 months Vitals Screenings to include cognitive, depression, and falls Referrals and appointments  In addition, I have reviewed and discussed with patient certain preventive protocols, quality metrics, and best practice recommendations. A written personalized care plan for preventive services as well as general preventive health recommendations were provided to patient.     Remi Haggard, LPN   09/28/6946   Nurse Notes:

## 2022-10-15 ENCOUNTER — Ambulatory Visit: Payer: PPO | Admitting: Adult Health

## 2022-10-15 ENCOUNTER — Encounter: Payer: Self-pay | Admitting: Adult Health

## 2022-10-15 VITALS — BP 128/86 | HR 79 | Temp 97.8°F | Ht 63.0 in | Wt 266.2 lb

## 2022-10-15 DIAGNOSIS — J309 Allergic rhinitis, unspecified: Secondary | ICD-10-CM | POA: Diagnosis not present

## 2022-10-15 DIAGNOSIS — J4530 Mild persistent asthma, uncomplicated: Secondary | ICD-10-CM

## 2022-10-15 NOTE — Patient Instructions (Addendum)
Continue on BREO 1 puff daily, rinse after use.  Continue on Zyrtec and Singulair daily  Continue Astelin and Flonase daily  Albuterol inhaler As needed   Asthma action plan as discussed.  Continue on Omeprazole daily .  Continue on CPAP At bedtime   Follow up with Dr. Francine Graven or Arwen Haseley NP in 6 months (30 min slot ) .  Please contact office for sooner follow up if symptoms do not improve or worsen or seek emergency care

## 2022-10-15 NOTE — Assessment & Plan Note (Signed)
Mild persistent asthma with excellent control on current maintenance regimen  Plan  Patient Instructions  Continue on BREO 1 puff daily, rinse after use.  Continue on Zyrtec and Singulair daily  Continue Astelin and Flonase daily  Albuterol inhaler As needed   Asthma action plan as discussed.  Continue on Omeprazole daily .  Continue on CPAP At bedtime   Follow up with Dr. Francine Graven or Madalynn Pickelsimer NP in 6 months (30 min slot ) .  Please contact office for sooner follow up if symptoms do not improve or worsen or seek emergency care

## 2022-10-15 NOTE — Assessment & Plan Note (Signed)
Controlled on current regimen no changes 

## 2022-10-15 NOTE — Progress Notes (Signed)
@Patient  ID: Renee Flores, female    DOB: 06-20-1965, 57 y.o.   MRN: [id number]  Chief Complaint  Patient presents with   Follow-up    Referring provider: Sheliah Hatch, MD  HPI: 57 year old female seen for pulmonary consult June 04, 2022 for shortness of breath wheezing and elevated eosinophil count felt to have underlying asthma with allergic phenotype OSA hx on CPAP managed by Neuro  TEST/EVENTS :  RAST panel June 04, 2022 essentially negative Absolute eosinophil count March 17, 2022 900   Asthma workup:  Previous exposure to mold in home and possible asbestos.(Shingles)   PFTs done on June 24, 2022 showed no significant airflow obstruction or restriction. FEV1 at 91%, ratio 90, FVC 79%, positive bronchodilator response, DLCO 114%   CT chest was negative for ILD. Did show some mild tracheobronchomalacia on expiratory phase.   10/15/2022 Follow up ; Asthma and AR  Patient returns for 80-month follow-up.  Patient is initially seen for cough and shortness of breath.  She was treated for suspected underlying asthma.  PFTs showed significant bronchodilator response with no airflow obstruction.  Absolute eosinophil count was elevated at 900.  CT chest was negative for ILD.  Patient was started on Breo.  Continued on Singulair, Zyrtec Astelin and Flonase nasal sprays.  Patient says she has been doing well.  Breathing has been under better control.  No significant cough.  Does have occasional drippy nose.     Allergies  Allergen Reactions   Hydrochlorothiazide Nausea And Vomiting    Other reaction(s): Other (See Comments) Leg Cramps Leg Cramps  Other reaction(s): Other (See Comments) Leg Cramps   Morphine Other (See Comments), Rash and Nausea And Vomiting    Hallucinating  Other reaction(s): Other (See Comments) Hallucinating Hallucinating Hallucinating Hallucinating  Hallucinating  Hallucinating  Other reaction(s): Other (See Comments) Hallucinating  Hallucinating   Vancomycin Nausea And Vomiting    Ringing in ears   Erythromycin Base Hives   Erythromycin Base Hives   Vancomycin Hcl Other (See Comments)    Ringing in ears   Doxycycline Nausea Only and Rash    Immunization History  Administered Date(s) Administered   Moderna Sars-Covid-2 Vaccination 08/05/2019, 09/01/2019, 04/05/2020   Tdap 04/28/2006, 09/12/2021    Past Medical History:  Diagnosis Date   Allergy    Arthritis    Bulging lumbar disc    L4-5   Complication of anesthesia    anxiety attack after waking up from anesthesia   Factor 5 Leiden mutation, heterozygous (HCC)    Factor V deficiency, congenital (HCC) 03/23/2012   GERD (gastroesophageal reflux disease)    Heart murmur    Heart palpitations    Heart valve problem    mild- moderate mitral valve leakage    High cholesterol    HYPERCHOLESTEROLEMIA 02/27/2010   Qualifier: Diagnosis of  By: Lennox Laity RN, Denise     Hypersomnia with sleep apnea, unspecified 11/03/2013   Hypertension    Lumbar radiculopathy 08/12/2018   MS (multiple sclerosis) (HCC)    questionable   Neuromuscular disorder (HCC)    OSA on CPAP 03/25/2017   Osteopenia 04/2016   T score -1.9   Osteoporosis    Preop cardiovascular exam 03/23/2012   Retrognathia 11/03/2013   Right knee DJD 07/08/2012   Severe obesity (BMI >= 40) (HCC) 11/03/2013   Shortness of breath 06/03/2013   Sleep apnea    Vitamin D deficiency 2014   17    Tobacco History: Social History   Tobacco  Use  Smoking Status Never   Passive exposure: Never  Smokeless Tobacco Never   Counseling given: Not Answered   Outpatient Medications Prior to Visit  Medication Sig Dispense Refill   aspirin EC 81 MG tablet Take 81 mg by mouth daily.     Calcium Carb-Cholecalciferol (OYSTER SHELL CALCIUM W/D) 500-5 MG-MCG TABS Take 1 tablet by mouth daily. 90 tablet 1   cloNIDine (CATAPRES) 0.1 MG tablet TAKE 1 TABLET THREE TIMES DAILY 270 tablet 10   cyclobenzaprine (FLEXERIL) 5 MG  tablet Take 1 tablet (5 mg total) by mouth 3 (three) times daily as needed for muscle spasms. 30 tablet 1   fluticasone (FLONASE) 50 MCG/ACT nasal spray Place 2 sprays into both nostrils daily. (Patient taking differently: Place 2 sprays into both nostrils daily as needed for allergies.) 16 g 6   fluticasone furoate-vilanterol (BREO ELLIPTA) 200-25 MCG/ACT AEPB Inhale 1 puff into the lungs daily. 60 each 5   gabapentin (NEURONTIN) 300 MG capsule Take 2 capsules (600 mg total) by mouth at bedtime. 180 capsule 1   ketotifen (ZADITOR) 0.025 % ophthalmic solution Place 2 drops into both eyes daily as needed (for irritation). 5 mL 0   losartan (COZAAR) 50 MG tablet Take 1 tablet (50 mg total) by mouth 2 (two) times daily. 180 tablet 2   meloxicam (MOBIC) 15 MG tablet Take 1 tablet (15 mg total) by mouth daily. 90 tablet 0   metoprolol succinate (TOPROL-XL) 50 MG 24 hr tablet Take 1 tablet (50 mg total) by mouth daily. 90 tablet 3   montelukast (SINGULAIR) 10 MG tablet Take 1 tablet (10 mg total) by mouth at bedtime. 90 tablet 0   Multiple Vitamins-Minerals (ONE-A-DAY WOMENS 50+ ADVANTAGE PO) Take 1 tablet by mouth daily. Unknown strength     omeprazole (PRILOSEC) 40 MG capsule TAKE 1 CAPSULE EVERY DAY 90 capsule 3   ondansetron (ZOFRAN) 4 MG tablet Take 1 tablet (4 mg total) by mouth every 8 (eight) hours as needed for nausea or vomiting. 20 tablet 0   simvastatin (ZOCOR) 40 MG tablet TAKE 1 TABLET AT BEDTIME 90 tablet 0   spironolactone (ALDACTONE) 25 MG tablet Take 0.5 tablets (12.5 mg total) by mouth daily. 45 tablet 3   No facility-administered medications prior to visit.     Review of Systems:   Constitutional:   No  weight loss, night sweats,  Fevers, chills, fatigue, or  lassitude.  HEENT:   No headaches,  Difficulty swallowing,  Tooth/dental problems, or  Sore throat,                No sneezing, itching, ear ache, +nasal congestion, post nasal drip,   CV:  No chest pain,  Orthopnea, PND,  swelling in lower extremities, anasarca, dizziness, palpitations, syncope.   GI  No heartburn, indigestion, abdominal pain, nausea, vomiting, diarrhea, change in bowel habits, loss of appetite, bloody stools.   Resp: No shortness of breath with exertion or at rest.  No excess mucus, no productive cough,  No non-productive cough,  No coughing up of blood.  No change in color of mucus.  No wheezing.  No chest wall deformity  Skin: no rash or lesions.  GU: no dysuria, change in color of urine, no urgency or frequency.  No flank pain, no hematuria   MS:  No joint pain or swelling.  No decreased range of motion.  No back pain.    Physical Exam  BP 128/86 (BP Location: Left Arm, Patient Position: Sitting,  Cuff Size: Large)   Pulse 79   Temp 97.8 F (36.6 C) (Oral)   Ht 5\' 3"  (1.6 m)   Wt 266 lb 3.2 oz (120.7 kg)   SpO2 95%   BMI 47.16 kg/m   GEN: A/Ox3; pleasant , NAD, well nourished    HEENT:  [place]/AT,  NOSE-clear, THROAT-clear, no lesions, no postnasal drip or exudate noted.   NECK:  Supple w/ fair ROM; no JVD; normal carotid impulses w/o bruits; no thyromegaly or nodules palpated; no lymphadenopathy.    RESP  Clear  P & A; w/o, wheezes/ rales/ or rhonchi. no accessory muscle use, no dullness to percussion  CARD:  RRR, no m/r/g, no peripheral edema, pulses intact, no cyanosis or clubbing.  GI:   Soft & nt; nml bowel sounds; no organomegaly or masses detected.   Musco: Warm bil, no deformities or joint swelling noted.   Neuro: alert, no focal deficits noted.    Skin: Warm, no lesions or rashes    Lab Results:   BNP No results found for: "BNP"  ProBNP No results found for: "PROBNP"  Imaging:        Latest Ref Rng & Units 06/24/2022    2:45 PM  PFT Results  FVC-Pre L 2.71   FVC-Predicted Pre % 81   FVC-Post L 2.64   FVC-Predicted Post % 79   Pre FEV1/FVC % % 77   Post FEV1/FCV % % 90   FEV1-Pre L 2.10   FEV1-Predicted Pre % 80   FEV1-Post L 2.38   DLCO  uncorrected ml/min/mmHg 22.88   DLCO UNC% % 114   DLCO corrected ml/min/mmHg 22.88   DLCO COR %Predicted % 114   DLVA Predicted % 127   TLC L 4.53   TLC % Predicted % 92   RV % Predicted % 81     No results found for: "NITRICOXIDE"      Assessment & Plan:   Asthma Mild persistent asthma with excellent control on current maintenance regimen  Plan  Patient Instructions  Continue on BREO 1 puff daily, rinse after use.  Continue on Zyrtec and Singulair daily  Continue Astelin and Flonase daily  Albuterol inhaler As needed   Asthma action plan as discussed.  Continue on Omeprazole daily .  Continue on CPAP At bedtime   Follow up with Dr. Francine Graven or Marcianna Daily NP in 6 months (30 min slot ) .  Please contact office for sooner follow up if symptoms do not improve or worsen or seek emergency care      Allergic rhinitis Controlled on current regimen no changes     Rubye Oaks, NP 10/15/2022

## 2022-10-23 ENCOUNTER — Other Ambulatory Visit: Payer: Self-pay

## 2022-10-23 ENCOUNTER — Telehealth: Payer: Self-pay | Admitting: Family Medicine

## 2022-10-23 ENCOUNTER — Ambulatory Visit: Payer: Medicare HMO | Admitting: Adult Health

## 2022-10-23 DIAGNOSIS — E1169 Type 2 diabetes mellitus with other specified complication: Secondary | ICD-10-CM

## 2022-10-23 MED ORDER — SIMVASTATIN 40 MG PO TABS: 40.0000 mg | ORAL_TABLET | Freq: Every day | ORAL | 1 refills | Status: DC

## 2022-10-23 NOTE — Telephone Encounter (Signed)
Sent refill into pharmacy

## 2022-10-23 NOTE — Telephone Encounter (Signed)
Encourage patient to contact the pharmacy for refills or they can request refills through [facility]   WHAT PHARMACY WOULD THEY LIKE THIS SENT TO:  [id number] [place], [place] - [address]   MEDICATION NAME & DOSE: simvastatin (ZOCOR) 40 MG tablet  NOTES/COMMENTS FROM PATIENT: [facility]     Front office please notify patient: It takes 48-72 hours to process rx refill requests Ask patient to call pharmacy to ensure rx is ready before heading there.

## 2022-11-17 ENCOUNTER — Encounter: Payer: Self-pay | Admitting: Family Medicine

## 2022-11-29 ENCOUNTER — Other Ambulatory Visit: Payer: Self-pay | Admitting: Cardiology

## 2022-12-03 ENCOUNTER — Ambulatory Visit (INDEPENDENT_AMBULATORY_CARE_PROVIDER_SITE_OTHER): Payer: PPO | Admitting: Family Medicine

## 2022-12-03 ENCOUNTER — Telehealth: Payer: Self-pay | Admitting: Family Medicine

## 2022-12-03 ENCOUNTER — Encounter: Payer: Self-pay | Admitting: Family Medicine

## 2022-12-03 VITALS — BP 120/84 | HR 79 | Temp 98.2°F | Resp 18 | Ht 63.0 in | Wt 263.1 lb

## 2022-12-03 DIAGNOSIS — U071 COVID-19: Secondary | ICD-10-CM | POA: Diagnosis not present

## 2022-12-03 DIAGNOSIS — Z1152 Encounter for screening for COVID-19: Secondary | ICD-10-CM

## 2022-12-03 LAB — POC COVID19 BINAXNOW: SARS Coronavirus 2 Ag: POSITIVE — AB

## 2022-12-03 MED ORDER — NIRMATRELVIR/RITONAVIR (PAXLOVID)TABLET: 3.0000 | ORAL_TABLET | Freq: Two times a day (BID) | ORAL | 0 refills | Status: AC

## 2022-12-03 NOTE — Progress Notes (Signed)
   Subjective:    Patient ID: Renee Flores, female    DOB: Dec 31, 1965, 57 y.o.   MRN: [id number]  HPI URI- sxs started Sunday w/ HA, nasal congestion.  Had sore throat.  Yesterday developed a cough.  Denies body aches.  No fever.  + sick contacts.  No SOB.  + sinus pressure.   Review of Systems For ROS see HPI     Objective:   Physical Exam Vitals reviewed.  Constitutional:      General: She is not in acute distress.    Appearance: Normal appearance. She is not ill-appearing.  HENT:     Head: Normocephalic and atraumatic.     Nose: Congestion present. No rhinorrhea.  Eyes:     Extraocular Movements: Extraocular movements intact.     Conjunctiva/sclera: Conjunctivae normal.  Cardiovascular:     Rate and Rhythm: Normal rate and regular rhythm.  Pulmonary:     Effort: Pulmonary effort is normal. No respiratory distress.     Breath sounds: No wheezing or rhonchi.     Comments: + hacking cough Musculoskeletal:     Cervical back: Neck supple.  Lymphadenopathy:     Cervical: No cervical adenopathy.  Skin:    General: Skin is warm and dry.  Neurological:     General: No focal deficit present.     Mental Status: She is alert and oriented to person, place, and time.  Psychiatric:        Mood and Affect: Mood normal.        Behavior: Behavior normal.        Thought Content: Thought content normal.           Assessment & Plan:  COVID- new.  Pt is on day 4 of sxs.  Will start Paxlovid.  She was advised to stop Simvastatin while taking antiviral medication.  Reviewed supportive care and red flags that should prompt return.  Pt expressed understanding and is in agreement w/ plan.

## 2022-12-03 NOTE — Telephone Encounter (Signed)
Pt reports she goes to bathroom every 15 mins Pt reports ankle pain on (Right)

## 2022-12-03 NOTE — Telephone Encounter (Signed)
Pt needs a detailed statement to excuse from jury duty

## 2022-12-03 NOTE — Patient Instructions (Addendum)
Follow up as needed or as scheduled START the Paxlovid as directed.  STOP the Simvastatin when you are on the antiviral treatment and then restart when you're done Drink LOTS of fluids REST!!! Treat any symptoms as they come- cough syrups, tylenol/ibuprofen, etc Isolate again tomorrow and then wear a mask for the next 5 days Call with any questions or concerns Hang in there!!!

## 2022-12-03 NOTE — Telephone Encounter (Signed)
Sent to Dr.Tabori. Pt reports she needs this by 12/21/2022

## 2022-12-11 ENCOUNTER — Encounter: Payer: Self-pay | Admitting: Family Medicine

## 2022-12-12 NOTE — Telephone Encounter (Signed)
noted 

## 2022-12-12 NOTE — Telephone Encounter (Signed)
Addressed via MyChart message

## 2022-12-24 ENCOUNTER — Telehealth: Payer: Self-pay | Admitting: Pulmonary Disease

## 2022-12-24 MED ORDER — FLUTICASONE FUROATE-VILANTEROL 200-25 MCG/ACT IN AEPB: 1.0000 | INHALATION_SPRAY | Freq: Every day | RESPIRATORY_TRACT | 5 refills | Status: DC

## 2022-12-24 NOTE — Telephone Encounter (Signed)
[facility] [place]  Please [facility] [place]. Thanks,

## 2022-12-24 NOTE — Telephone Encounter (Signed)
Breo prescription has been sent in. Patient informed. Nothing else needed

## 2023-01-05 ENCOUNTER — Telehealth: Payer: Self-pay | Admitting: Adult Health

## 2023-01-05 NOTE — Telephone Encounter (Signed)
LVM and sent mychart msg informing pt of need to reschedule 02/03/23 appt - NP out

## 2023-01-07 ENCOUNTER — Other Ambulatory Visit: Payer: Self-pay

## 2023-01-07 DIAGNOSIS — M5416 Radiculopathy, lumbar region: Secondary | ICD-10-CM

## 2023-01-07 MED ORDER — MELOXICAM 15 MG PO TABS
15.0000 mg | ORAL_TABLET | Freq: Every day | ORAL | 1 refills | Status: DC
Start: 2023-01-07 — End: 2023-06-11

## 2023-01-21 ENCOUNTER — Encounter: Payer: PPO | Admitting: Pharmacist

## 2023-02-03 ENCOUNTER — Ambulatory Visit: Payer: Medicare HMO | Admitting: Adult Health

## 2023-02-06 ENCOUNTER — Telehealth: Payer: Self-pay

## 2023-02-06 ENCOUNTER — Ambulatory Visit
Admission: RE | Admit: 2023-02-06 | Discharge: 2023-02-06 | Disposition: A | Discharge status: Home / Self Care | Payer: PPO | Source: Ambulatory Visit | Attending: Family Medicine | Admitting: Family Medicine

## 2023-02-06 ENCOUNTER — Telehealth: Payer: Self-pay | Admitting: Family Medicine

## 2023-02-06 DIAGNOSIS — Z90722 Acquired absence of ovaries, bilateral: Secondary | ICD-10-CM | POA: Diagnosis not present

## 2023-02-06 DIAGNOSIS — M8588 Other specified disorders of bone density and structure, other site: Secondary | ICD-10-CM | POA: Diagnosis not present

## 2023-02-06 DIAGNOSIS — Z780 Asymptomatic menopausal state: Secondary | ICD-10-CM

## 2023-02-06 DIAGNOSIS — N958 Other specified menopausal and perimenopausal disorders: Secondary | ICD-10-CM | POA: Diagnosis not present

## 2023-02-06 DIAGNOSIS — E349 Endocrine disorder, unspecified: Secondary | ICD-10-CM | POA: Diagnosis not present

## 2023-02-06 NOTE — Telephone Encounter (Signed)
Pt was informed of test results and was told to call back if she had any questions

## 2023-02-06 NOTE — Telephone Encounter (Signed)
-----   Message from Neena Rhymes sent at 02/06/2023  3:05 PM EDT ----- Your bone density shows mild osteopenia (thinning bone but not as severe as osteoporosis).  Make sure you are taking daily Calcium and Vit D.

## 2023-02-06 NOTE — Telephone Encounter (Signed)
Lvm for patient to return call about bone density results

## 2023-02-09 NOTE — Telephone Encounter (Signed)
Pt has been notified.

## 2023-02-13 ENCOUNTER — Telehealth: Payer: Self-pay | Admitting: Family Medicine

## 2023-02-13 DIAGNOSIS — I270 Primary pulmonary hypertension: Secondary | ICD-10-CM

## 2023-02-13 MED ORDER — METOPROLOL SUCCINATE ER 50 MG PO TB24: 50.0000 mg | ORAL_TABLET | Freq: Every day | ORAL | 3 refills | Status: DC

## 2023-02-13 NOTE — Telephone Encounter (Signed)
Prescription Request  02/13/2023  LOV: 12/03/2022  What is the name of the medication or equipment?  metoprolol succinate (TOPROL-XL) 50 MG 24 hr tablet   Have you contacted your pharmacy to request a refill? Yes   Which pharmacy would you like this sent to?  [facility]) [facility] [place], [place] - [address] [facility] [address] [place] [address] Phone: [phone] Fax: [phone]   Patient notified that their request is being sent to the clinical staff for review and that they should receive a response within 2 business days.   Please advise at Mobile [phone] (mobile)

## 2023-02-13 NOTE — Telephone Encounter (Signed)
Pt has been informed this was sent in for her

## 2023-02-18 ENCOUNTER — Other Ambulatory Visit: Payer: Self-pay | Admitting: Family Medicine

## 2023-02-18 DIAGNOSIS — E785 Hyperlipidemia, unspecified: Secondary | ICD-10-CM

## 2023-02-19 ENCOUNTER — Other Ambulatory Visit: Payer: Self-pay

## 2023-02-19 DIAGNOSIS — E785 Hyperlipidemia, unspecified: Secondary | ICD-10-CM

## 2023-02-19 DIAGNOSIS — T7840XA Allergy, unspecified, initial encounter: Secondary | ICD-10-CM

## 2023-02-19 MED ORDER — SIMVASTATIN 40 MG PO TABS: 40.0000 mg | ORAL_TABLET | Freq: Every day | ORAL | 0 refills | Status: DC

## 2023-02-19 MED ORDER — OYSTER SHELL CALCIUM W/D 500-5 MG-MCG PO TABS: 1.0000 | ORAL_TABLET | Freq: Every day | ORAL | 1 refills | Status: DC

## 2023-02-19 MED ORDER — SIMVASTATIN 40 MG PO TABS
40.0000 mg | ORAL_TABLET | Freq: Every day | ORAL | 1 refills | Status: DC
Start: 2023-02-19 — End: 2023-02-19

## 2023-02-19 MED ORDER — MONTELUKAST SODIUM 10 MG PO TABS
10.0000 mg | ORAL_TABLET | Freq: Every day | ORAL | 1 refills | Status: DC
Start: 2023-02-19 — End: 2023-07-21

## 2023-02-23 ENCOUNTER — Telehealth: Payer: Self-pay | Admitting: Family Medicine

## 2023-02-23 ENCOUNTER — Telehealth: Payer: Self-pay | Admitting: Neurology

## 2023-02-23 MED ORDER — OYSTER SHELL CALCIUM W/D 500-5 MG-MCG PO TABS: 1.0000 | ORAL_TABLET | Freq: Every day | ORAL | 1 refills | Status: DC

## 2023-02-23 NOTE — Telephone Encounter (Signed)
Sent to local pharmacy per request, pt informed

## 2023-02-23 NOTE — Telephone Encounter (Signed)
     Encourage patient to contact the pharmacy for refills or they can request refills through [facility]  (Please schedule appointment if patient has not been seen in over a year)    WHAT PHARMACY WOULD THEY LIKE THIS SENT TO: [id number] [place], [place] - [address]   MEDICATION NAME & DOSE:  Calcium Carb-Cholecalciferol (OYSTER SHELL CALCIUM W/D) 500-5 MG-MCG TABS  NOTES/COMMENTS FROM PATIENT: Prescription was sent to [facility], pharmacy stated insurance won't approve [facility] to fill it, the prescription needs to be sent to her local pharmacy to fill it, which is listed above.    Front office please notify patient: It takes 48-72 hours to process rx refill requests Ask patient to call pharmacy to ensure rx is ready before heading there.

## 2023-02-23 NOTE — Telephone Encounter (Signed)
Unable to reach pt over the phone, sent mychart msg informing pt of need to reschedule 03/19/23 appt - MD out

## 2023-03-05 ENCOUNTER — Other Ambulatory Visit: Payer: Self-pay

## 2023-03-05 MED ORDER — LOSARTAN POTASSIUM 50 MG PO TABS: 50.0000 mg | ORAL_TABLET | Freq: Two times a day (BID) | ORAL | 2 refills | Status: DC

## 2023-03-10 ENCOUNTER — Encounter: Payer: Self-pay | Admitting: Family Medicine

## 2023-03-11 MED ORDER — GABAPENTIN 300 MG PO CAPS: 600.0000 mg | ORAL_CAPSULE | Freq: Every day | ORAL | 1 refills | Status: DC

## 2023-03-17 IMAGING — DX DG CHEST 2V
2 series · 2 of 2 positions shown · non-contrast
Comparison: 07/01/2012

CLINICAL DATA: Cough, ongoing ear pain for 3-4 months, fever
overnight, some chest congestion and cough for 2 days

EXAM:
CHEST - 2 VIEW

[chest pa]
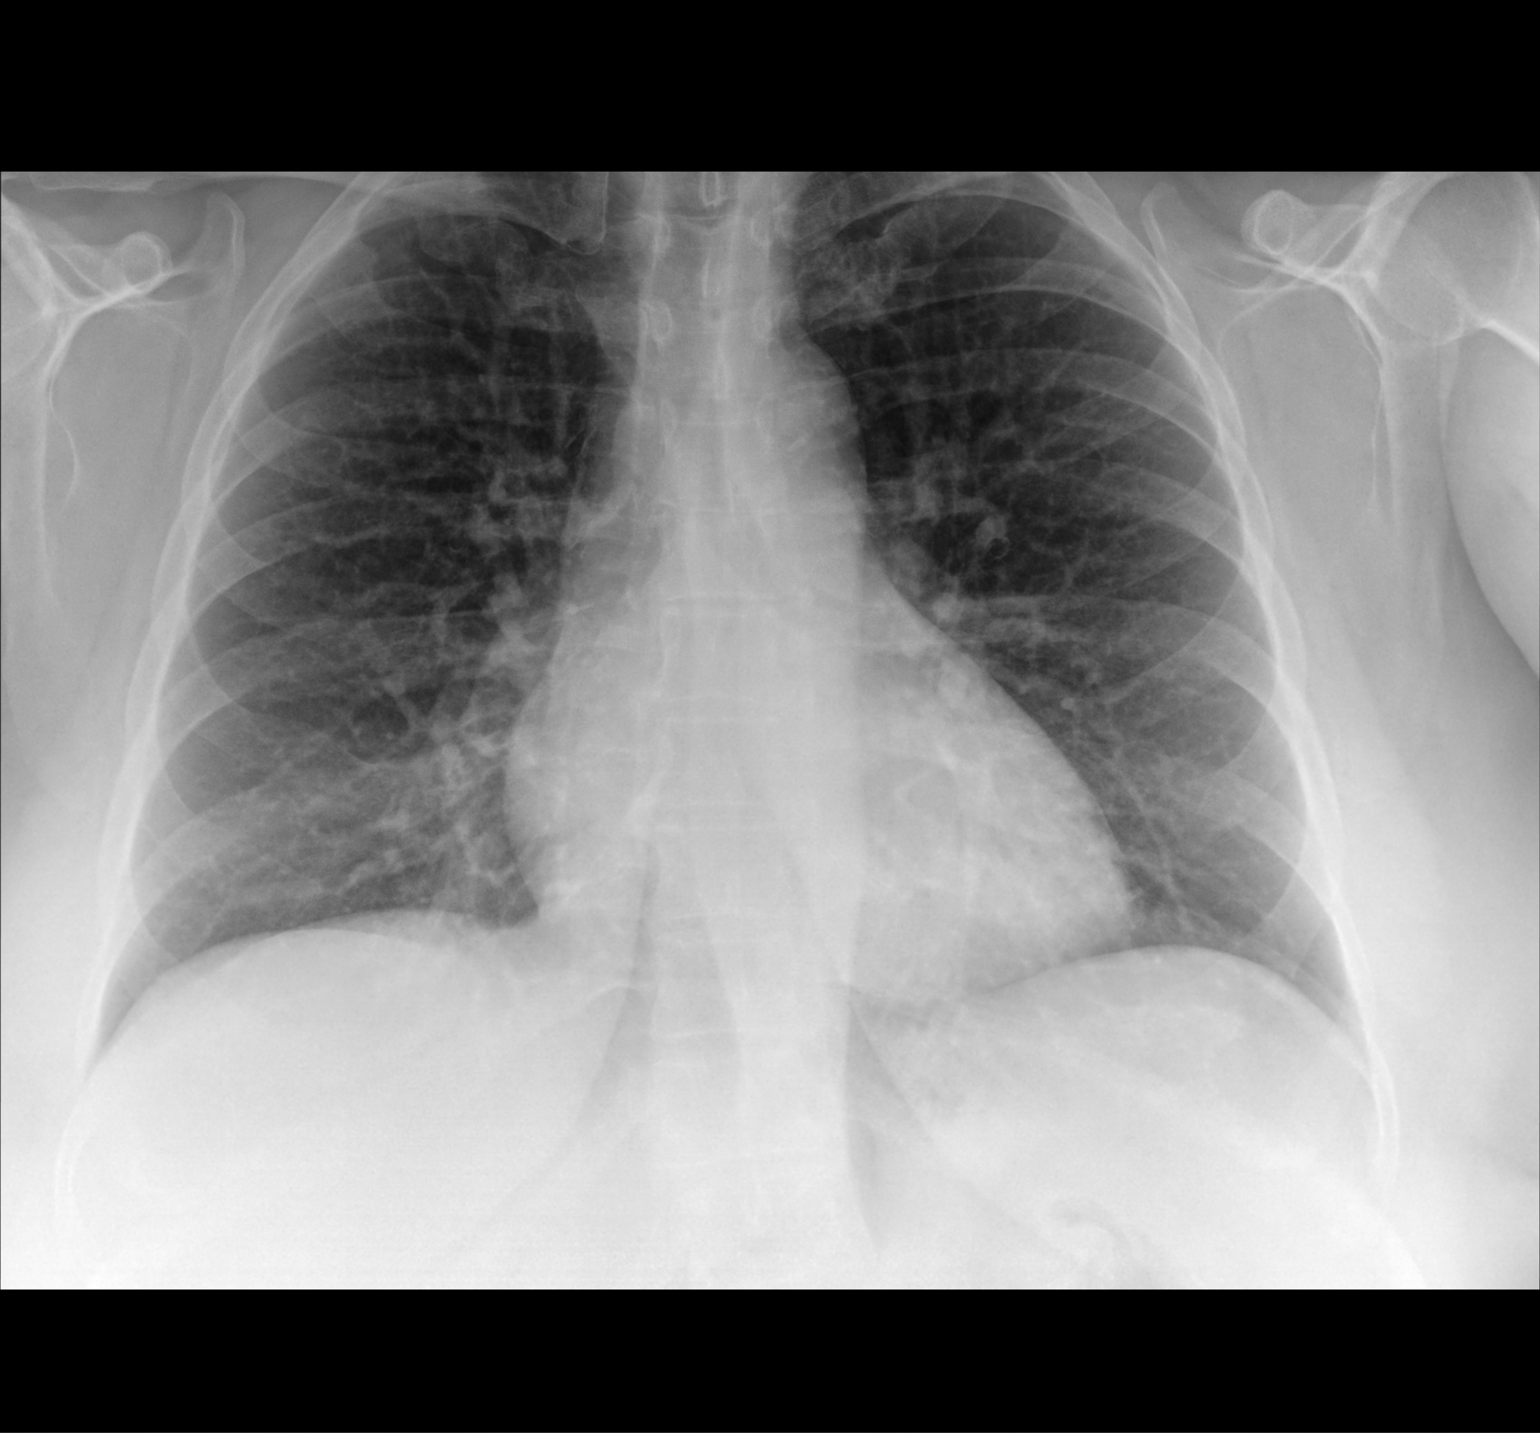

[chest lat]
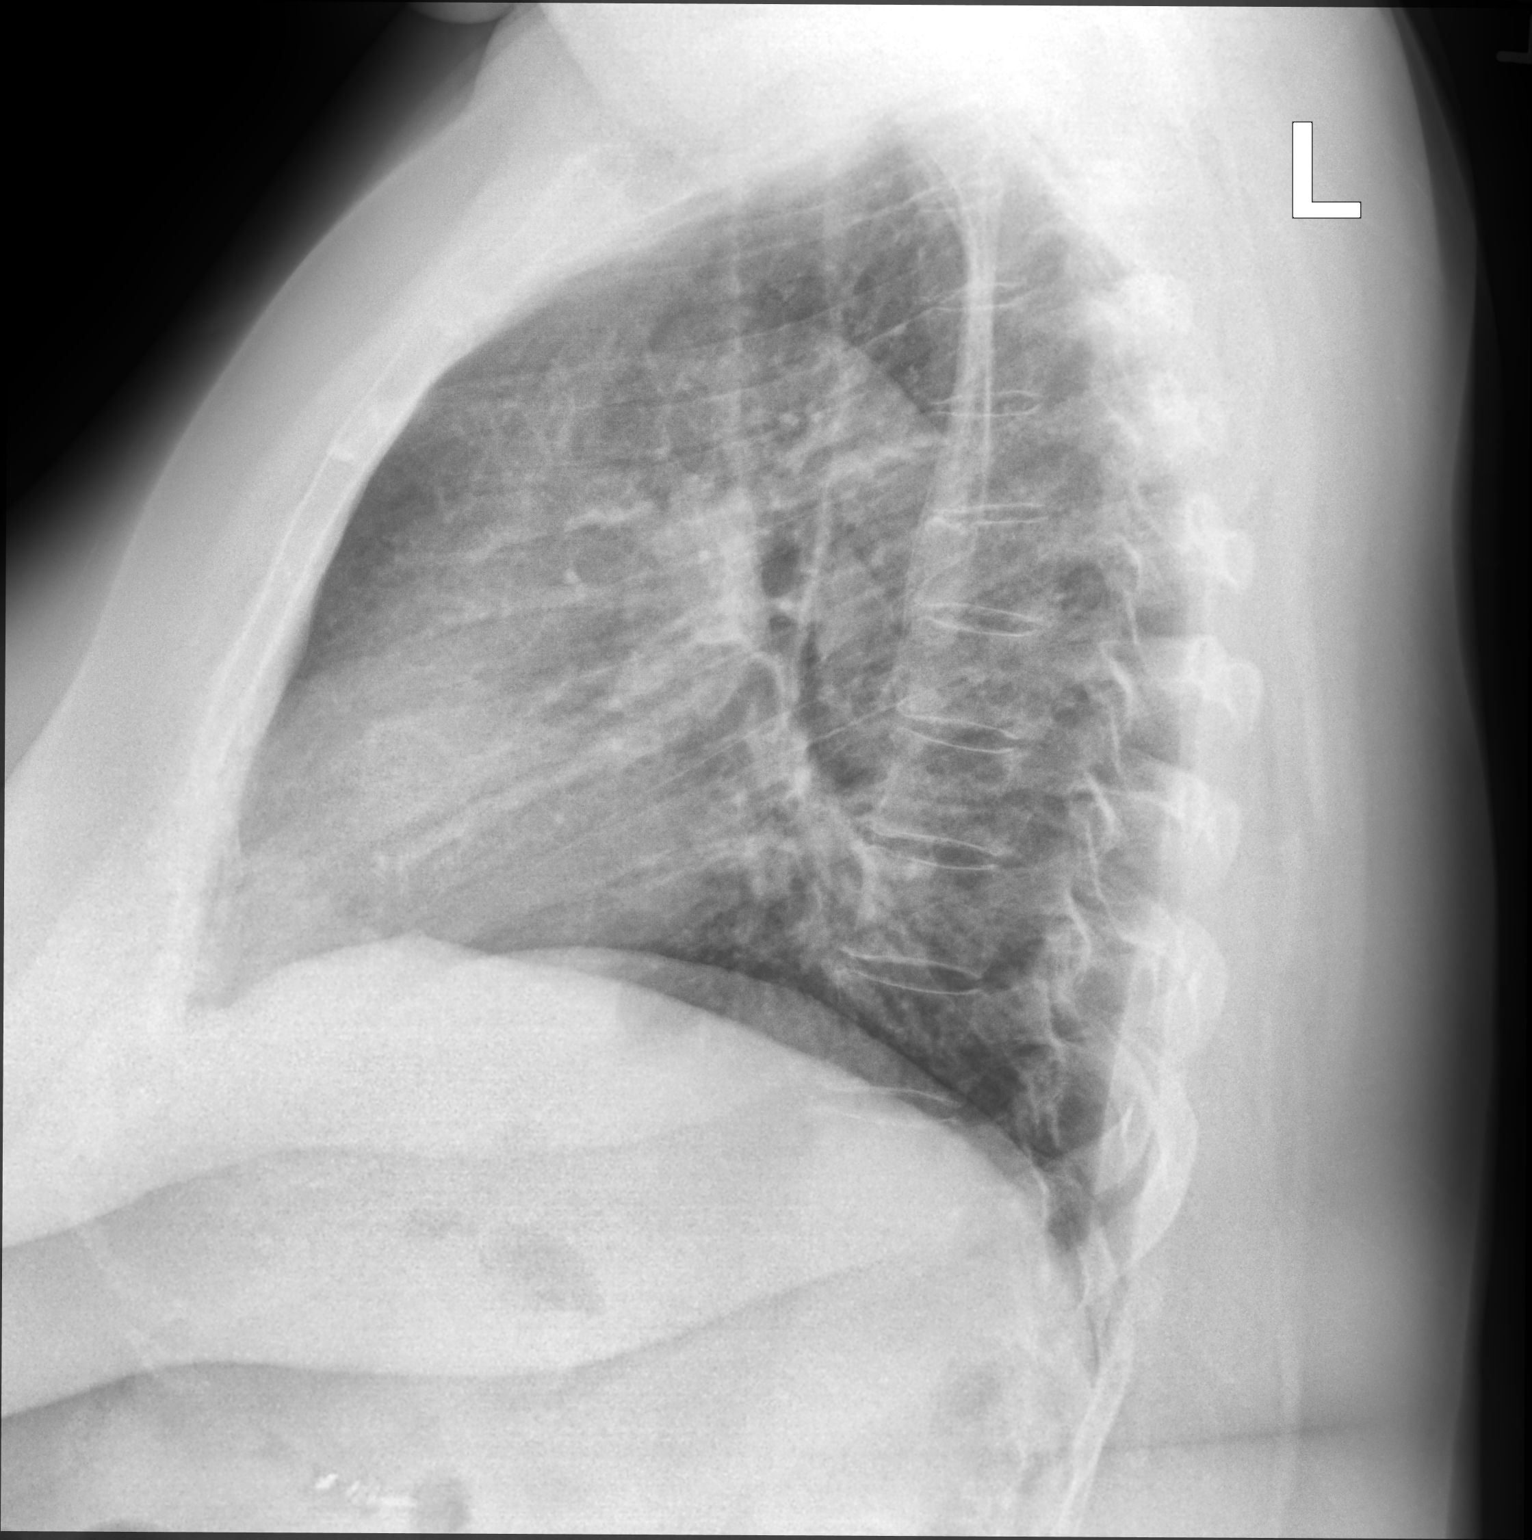

[2 of 2 positions shown; findings below may reference images not displayed]

FINDINGS: Normal heart size, mediastinal contours, and pulmonary vascularity.

Lungs clear.

No infiltrate, pleural effusion, or pneumothorax.

Osseous structures unremarkable.
IMPRESSION: No acute abnormalities.

## 2023-03-19 ENCOUNTER — Ambulatory Visit: Payer: Medicare HMO | Admitting: Neurology

## 2023-03-20 ENCOUNTER — Encounter: Payer: Self-pay | Admitting: Family Medicine

## 2023-03-20 ENCOUNTER — Ambulatory Visit (INDEPENDENT_AMBULATORY_CARE_PROVIDER_SITE_OTHER): Payer: PPO | Admitting: Family Medicine

## 2023-03-20 VITALS — BP 118/78 | HR 70 | Temp 97.8°F | Ht 63.0 in | Wt 269.4 lb

## 2023-03-20 DIAGNOSIS — Z23 Encounter for immunization: Secondary | ICD-10-CM | POA: Diagnosis not present

## 2023-03-20 DIAGNOSIS — I10 Essential (primary) hypertension: Secondary | ICD-10-CM

## 2023-03-20 DIAGNOSIS — E7800 Pure hypercholesterolemia, unspecified: Secondary | ICD-10-CM | POA: Diagnosis not present

## 2023-03-20 LAB — CBC WITH DIFFERENTIAL/PLATELET
Basophils Absolute: 0.1 10*3/uL (ref 0.0–0.1)
Basophils Relative: 1.1 % (ref 0.0–3.0)
Eosinophils Absolute: 0.6 10*3/uL (ref 0.0–0.7)
Eosinophils Relative: 7.5 % — ABNORMAL HIGH (ref 0.0–5.0)
HCT: 39.9 % (ref 36.0–46.0)
Hemoglobin: 12.7 g/dL (ref 12.0–15.0)
Lymphocytes Relative: 38 % (ref 12.0–46.0)
Lymphs Abs: 3 10*3/uL (ref 0.7–4.0)
MCHC: 31.8 g/dL (ref 30.0–36.0)
MCV: 89 fL (ref 78.0–100.0)
Monocytes Absolute: 0.5 10*3/uL (ref 0.1–1.0)
Monocytes Relative: 6.9 % (ref 3.0–12.0)
Neutro Abs: 3.6 10*3/uL (ref 1.4–7.7)
Neutrophils Relative %: 46.5 % (ref 43.0–77.0)
Platelets: 250 10*3/uL (ref 150.0–400.0)
RBC: 4.49 Mil/uL (ref 3.87–5.11)
RDW: 13.8 % (ref 11.5–15.5)
WBC: 7.8 10*3/uL (ref 4.0–10.5)

## 2023-03-20 LAB — BASIC METABOLIC PANEL
BUN: 16 mg/dL (ref 6–23)
CO2: 30 meq/L (ref 19–32)
Calcium: 9.7 mg/dL (ref 8.4–10.5)
Chloride: 100 meq/L (ref 96–112)
Creatinine, Ser: 0.78 mg/dL (ref 0.40–1.20)
GFR: 84.95 mL/min (ref >60.00)
Glucose, Bld: 105 mg/dL — ABNORMAL HIGH (ref 70–99)
Potassium: 4.1 meq/L (ref 3.5–5.1)
Sodium: 138 meq/L (ref 135–145)

## 2023-03-20 LAB — LIPID PANEL
Cholesterol: 158 mg/dL (ref 0–200)
HDL: 52 mg/dL (ref >39.00)
LDL Cholesterol: 73 mg/dL (ref 0–99)
NonHDL: 105.81
Total CHOL/HDL Ratio: 3
Triglycerides: 166 mg/dL — ABNORMAL HIGH (ref 0.0–149.0)
VLDL: 33.2 mg/dL (ref 0.0–40.0)

## 2023-03-20 LAB — HEPATIC FUNCTION PANEL
ALT: 36 U/L — ABNORMAL HIGH (ref 0–35)
AST: 28 U/L (ref 0–37)
Albumin: 4.4 g/dL (ref 3.5–5.2)
Alkaline Phosphatase: 75 U/L (ref 39–117)
Bilirubin, Direct: 0.1 mg/dL (ref 0.0–0.3)
Total Bilirubin: 0.6 mg/dL (ref 0.2–1.2)
Total Protein: 7.4 g/dL (ref 6.0–8.3)

## 2023-03-20 LAB — TSH: TSH: 2.07 u[IU]/mL (ref 0.35–5.50)

## 2023-03-20 NOTE — Assessment & Plan Note (Signed)
Chronic problem.  Excellent control today on Spironolactone 12.5mg  daily, Metoprolol XL 50mg  daily, Losartan 50mg  BID, Clonidine 0.1mg  TID.  Currently asymptomatic w/ exception of ongoing SOB.  Check labs due to ARB and diuretic use but no anticipated med changes.

## 2023-03-20 NOTE — Assessment & Plan Note (Signed)
Chronic problem.  Currently on Simvastatin 40mg  daily w/o difficulty.  Check labs.  Adjust meds prn

## 2023-03-20 NOTE — Progress Notes (Signed)
   Subjective:    Patient ID: Renee Flores, female    DOB: Jan 07, 1966, 57 y.o.   MRN: [id number]  HPI HTN- currently on Spironolactone 12.5mg  daily, Metoprolol XL 50mg  daily, Losartan 50mg  BID, Clonidine 0.1mg  TID w/ good control.  No CP, SOB above baseline, HA's, visual changes, edema.  Hyperlipidemia- chronic problem, on Simvastatin 40mg  daily.  Denies abd pain, N/V.  Obesity- pt has gained 6 lbs since last visit.  Pt has been walking when weather is appropriate.  Will also go to the gym sometimes.  Not following a particular diet.   Review of Systems For ROS see HPI     Objective:   Physical Exam Vitals reviewed.  Constitutional:      General: She is not in acute distress.    Appearance: Normal appearance. She is well-developed. She is obese. She is not ill-appearing.  HENT:     Head: Normocephalic and atraumatic.  Eyes:     Conjunctiva/sclera: Conjunctivae normal.     Pupils: Pupils are equal, round, and reactive to light.  Neck:     Thyroid: No thyromegaly.  Cardiovascular:     Rate and Rhythm: Normal rate and regular rhythm.     Pulses: Normal pulses.     Heart sounds: Normal heart sounds. No murmur heard. Pulmonary:     Effort: Pulmonary effort is normal. No respiratory distress.     Breath sounds: Normal breath sounds.  Abdominal:     General: There is no distension.     Palpations: Abdomen is soft.     Tenderness: There is no abdominal tenderness.  Musculoskeletal:     Cervical back: Normal range of motion and neck supple.     Right lower leg: No edema.     Left lower leg: No edema.  Lymphadenopathy:     Cervical: No cervical adenopathy.  Skin:    General: Skin is warm and dry.  Neurological:     General: No focal deficit present.     Mental Status: She is alert and oriented to person, place, and time.  Psychiatric:        Mood and Affect: Mood normal.        Behavior: Behavior normal.        Thought Content: Thought content normal.            Assessment & Plan:

## 2023-03-20 NOTE — Patient Instructions (Signed)
Schedule your complete physical in 6 months We'll notify you of your lab results and make any changes if needed Continue to work on healthy diet and regular exercise- you're doing great! Call with any questions or concerns Stay Safe!  Stay Healthy! Happy Holidays!!!

## 2023-03-20 NOTE — Assessment & Plan Note (Signed)
Deteriorated.  Pt has gained 6 lbs since last visit.  BMI now 47.72.  Stressed need for low carb diet and regular physical activity.  Will continue to follow.

## 2023-03-23 ENCOUNTER — Telehealth: Payer: Self-pay

## 2023-03-23 NOTE — Telephone Encounter (Signed)
-----   Message from Neena Rhymes sent at 03/23/2023  7:42 AM EST ----- Labs are stable and look good!  No changes at this time

## 2023-03-24 NOTE — Telephone Encounter (Signed)
Pt informed no concerns

## 2023-04-10 ENCOUNTER — Telehealth: Payer: Self-pay | Admitting: Cardiology

## 2023-04-10 NOTE — Telephone Encounter (Signed)
Pt would like to transfer to the dwb location and see Dr. Cristal Deer. Please advise if this is ok.   Thank you

## 2023-04-16 ENCOUNTER — Ambulatory Visit: Payer: PPO | Admitting: Adult Health

## 2023-04-21 ENCOUNTER — Other Ambulatory Visit: Payer: Self-pay

## 2023-04-21 DIAGNOSIS — I10 Essential (primary) hypertension: Secondary | ICD-10-CM

## 2023-04-21 IMAGING — DX DG ANKLE COMPLETE 3+V*L*
1 series · 3 of 3 positions shown · non-contrast
Comparison: The preoperative AP and lateral ankle series
01/25/2021.

CLINICAL DATA: Fusion procedure 2 days ago, with increasing left
ankle pain and swelling.

EXAM:
LEFT ANKLE COMPLETE - 3+ VIEW

[Series 1: ankle · 0.14mm/px · 3 of 3 slices shown]
[im 1/3]
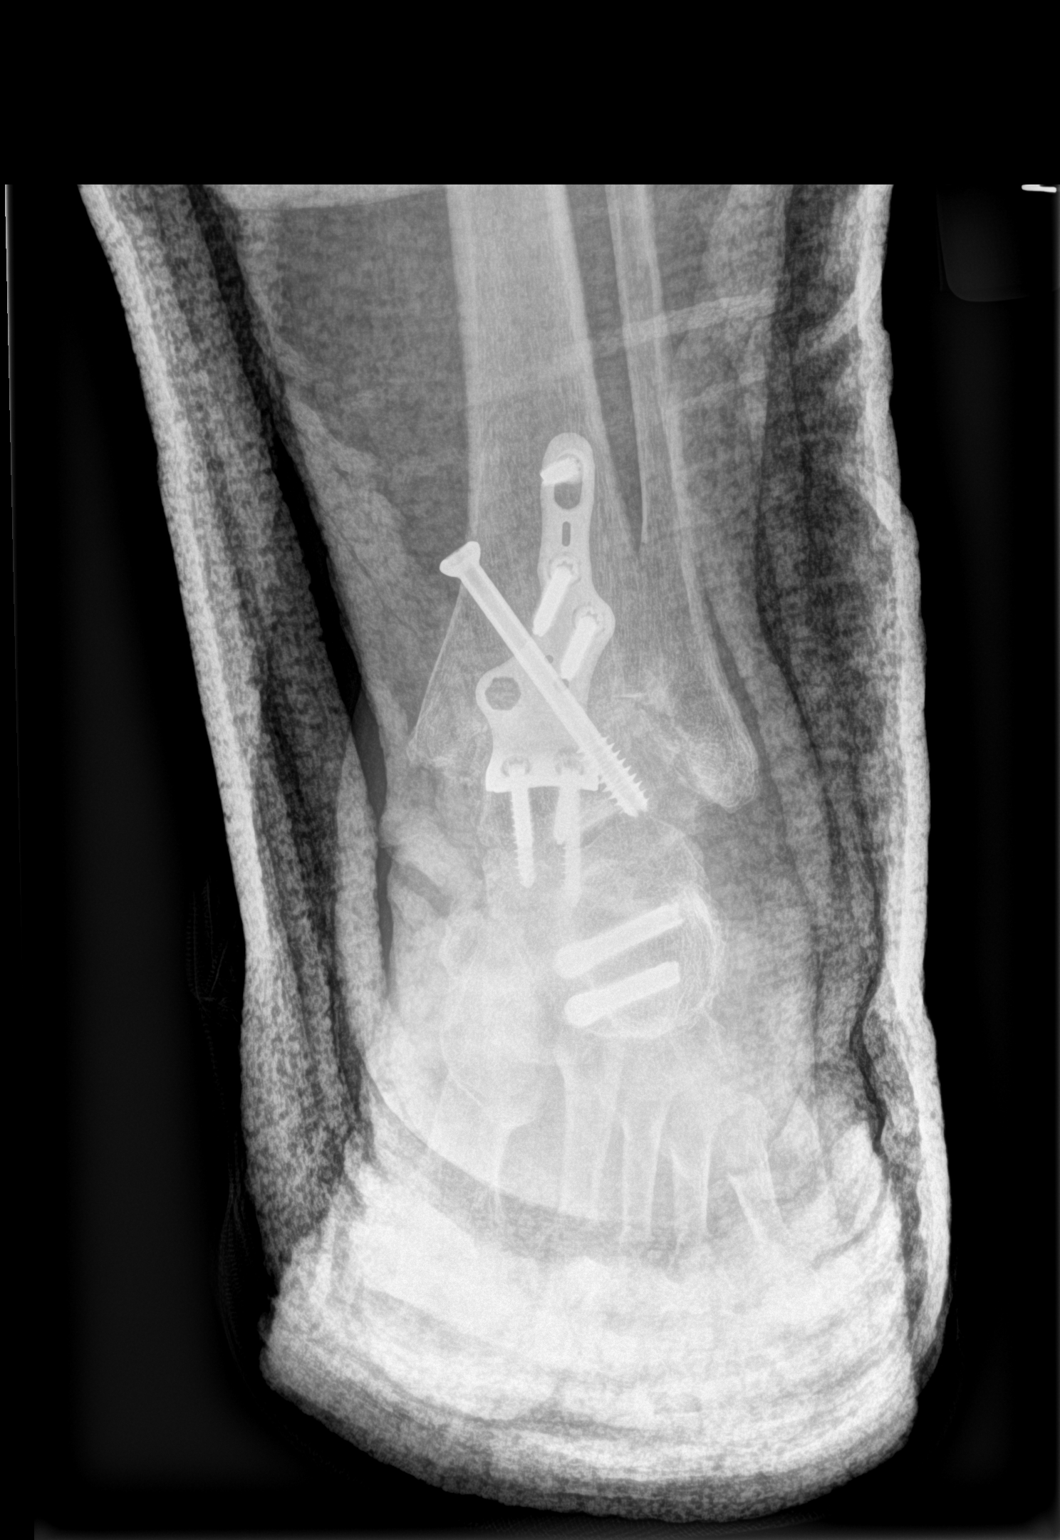
[im 2/3]
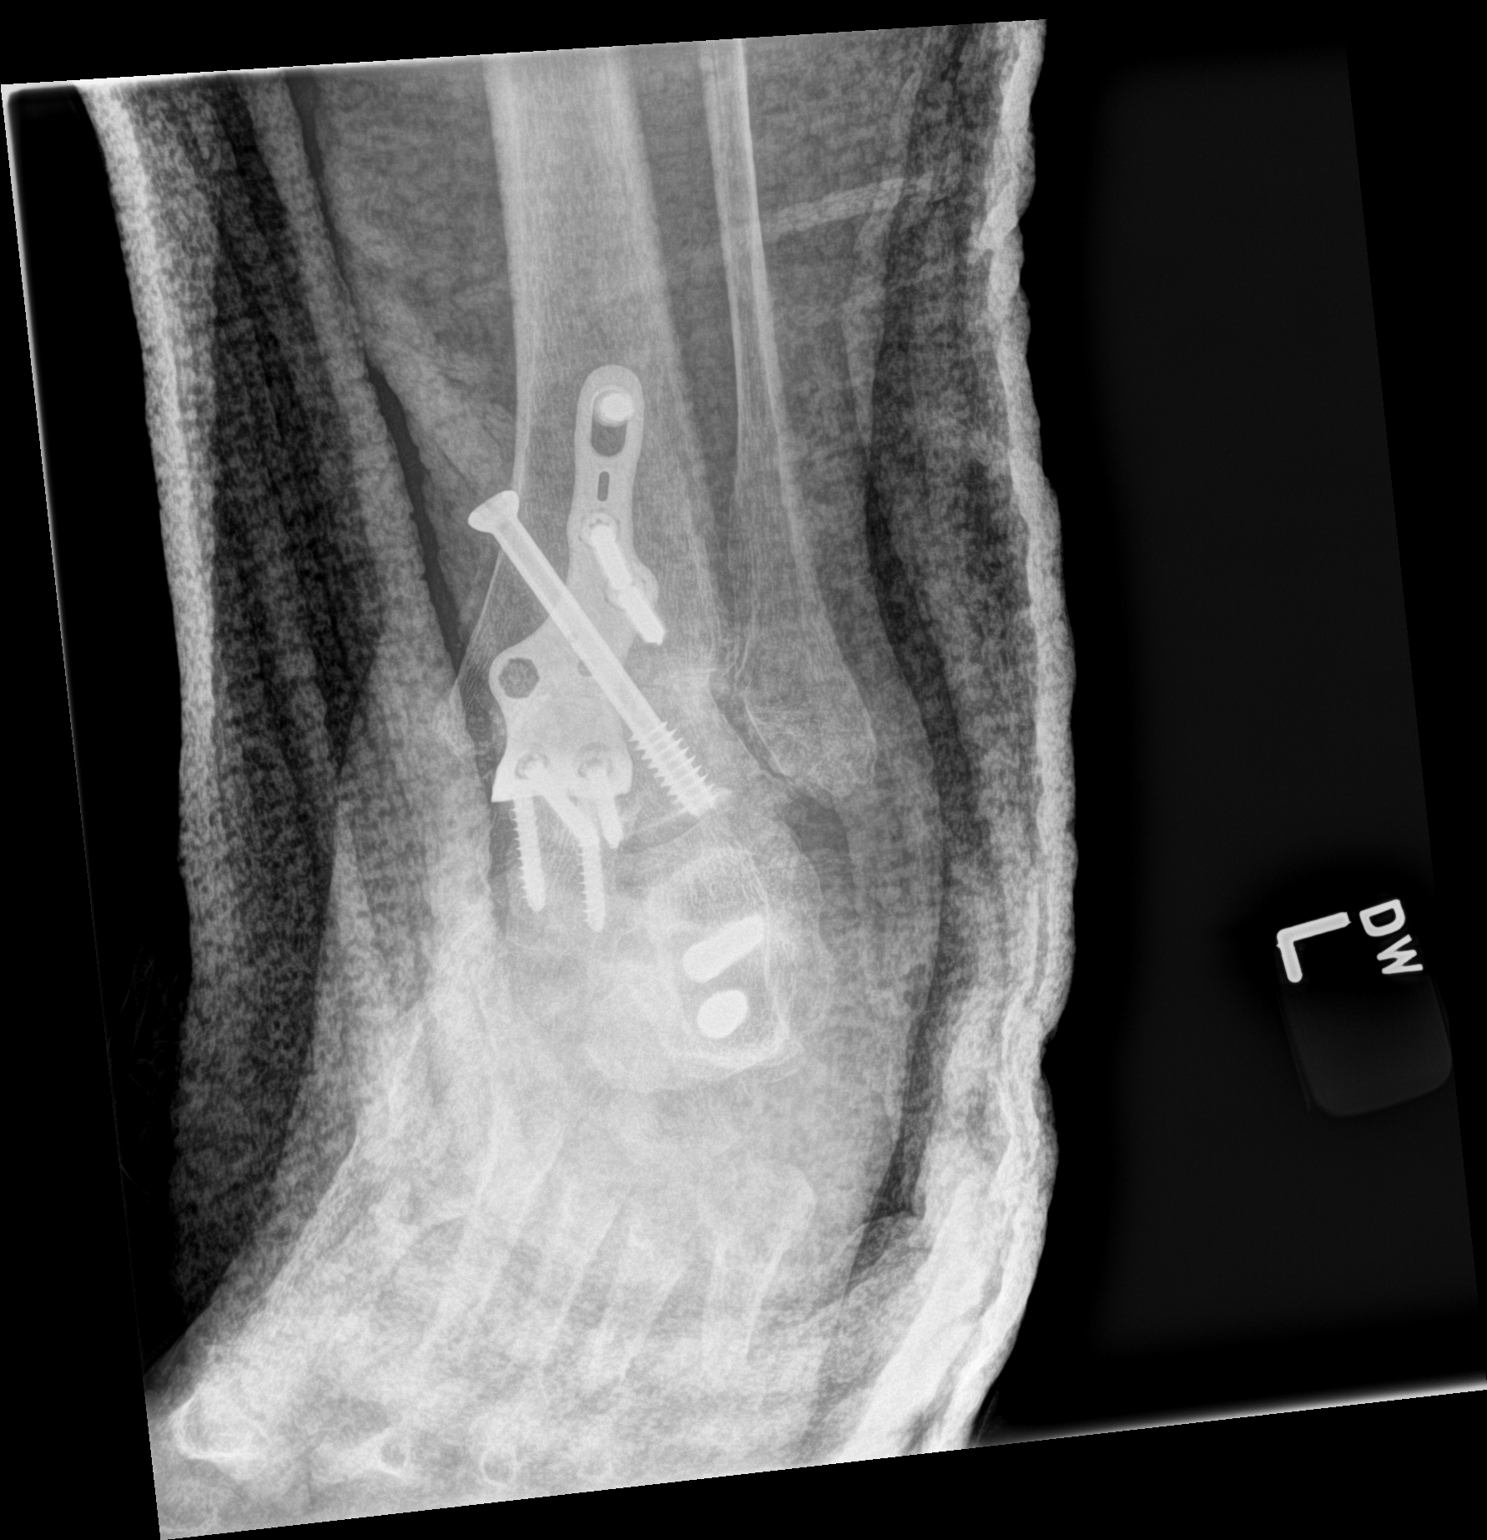
[im 3/3]
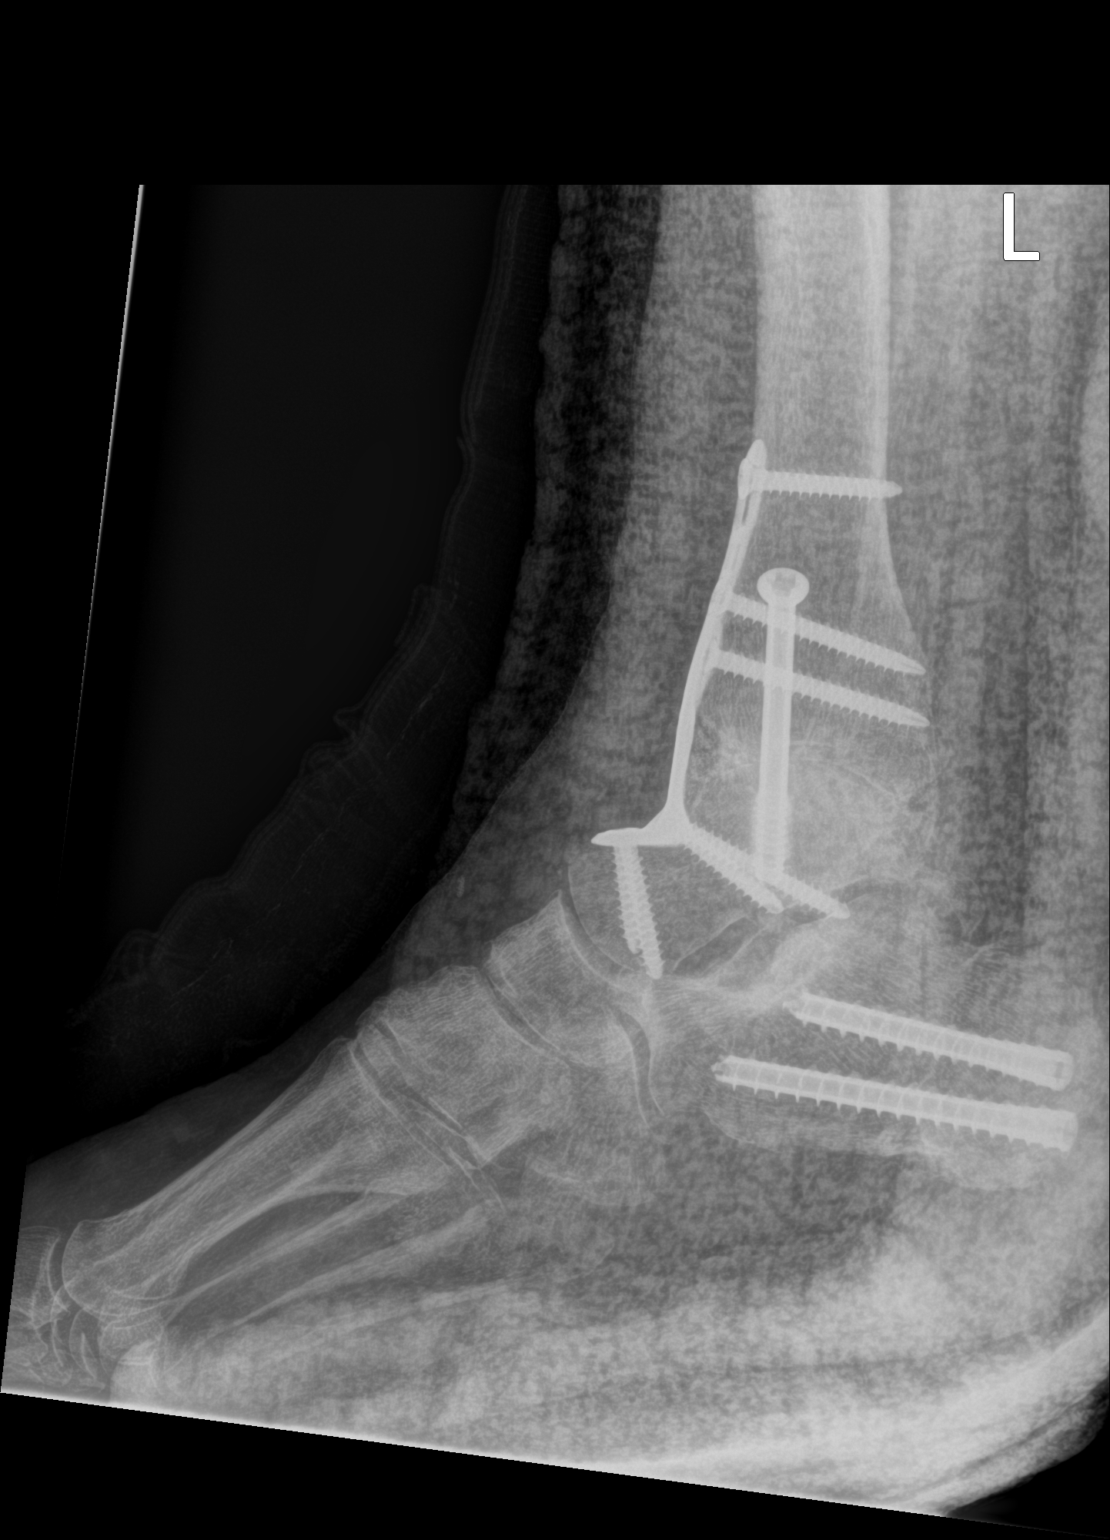

[3 of 3 positions shown; findings below may reference images not displayed]

FINDINGS: Anterior plate arthrodesis hardware is now seen extending over the
distal tibia and superior aspect of the neck and anterior process of
the talus with securing screws.

One of the securing screws projects in the subtalar joint space on
the lateral view. A separate threaded screw extends through the
medial aspect of the distal tibial metaphysis into the talus
oriented obliquely. There are old threaded screws in the calcaneus.

Overlying plaster casting material obscures fine detail. The joint
space is still visible but markedly narrowed.

No displaced fracture or hardware loosening is seen.

Moderate generalized soft tissue edema is increased over the ankle
and foot.
IMPRESSION: Arthrodesis hardware in place without evidence of loosening. No
fracture is seen through the plaster casting material, which
obscures fine detail. At least one of the securing screws for the
plate hardware projects in the subtalar joint space. There is
increased soft tissue prominence.

## 2023-04-21 MED ORDER — CLONIDINE HCL 0.1 MG PO TABS: 0.1000 mg | ORAL_TABLET | Freq: Three times a day (TID) | ORAL | 10 refills | Status: AC

## 2023-04-22 IMAGING — CT CT ANGIO CHEST
2 of 7 series · 17 of 46 positions shown · IV contrast (omnipaque)
Comparison: June 01, 2013

CLINICAL DATA: Recent ankle surgery with shortness of breath.

EXAM:
CT ANGIOGRAPHY CHEST WITH CONTRAST
TECHNIQUE: Multidetector CT imaging of the chest was performed using the
standard protocol during bolus administration of intravenous
contrast. Multiplanar CT image reconstructions and MIPs were
obtained to evaluate the vascular anatomy.
CONTRAST:  100mL OMNIPAQUE IOHEXOL 350 MG/ML SOLN

[Series 5: pe axial thins · axial · 0.79mm/px · z∈[+1247,+1514]mm · 14 of 309 slices shown]
[im 21/309  lung]
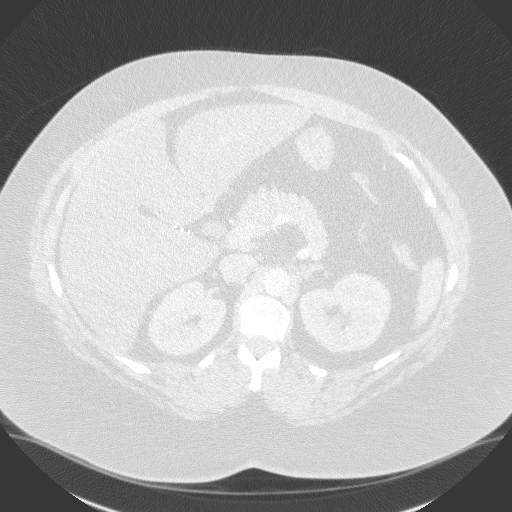
[im 42/309  soft-tissue]
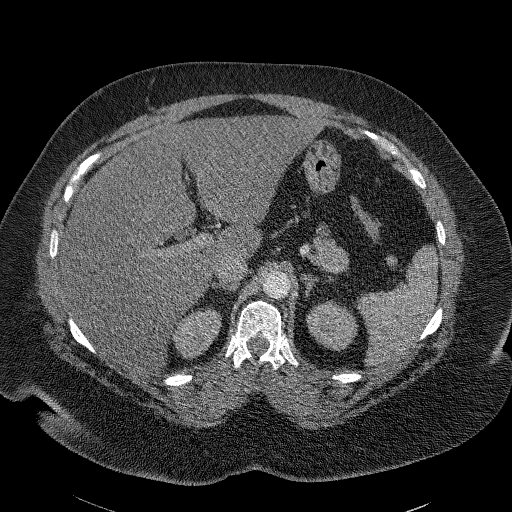
[im 62/309  lung]
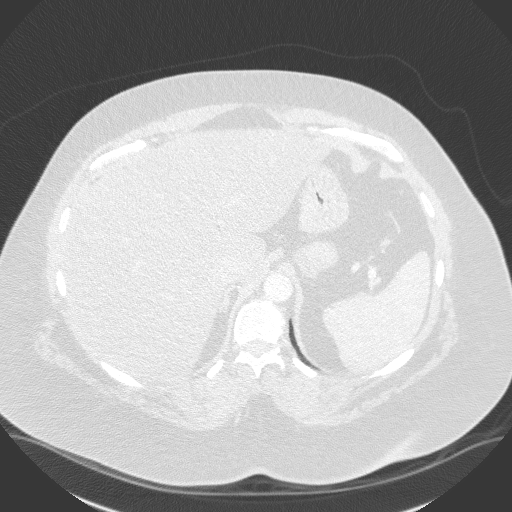
[im 83/309  soft-tissue]
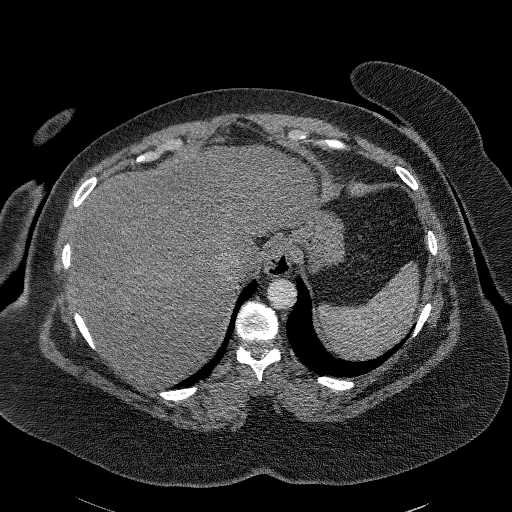
[im 103/309  lung]
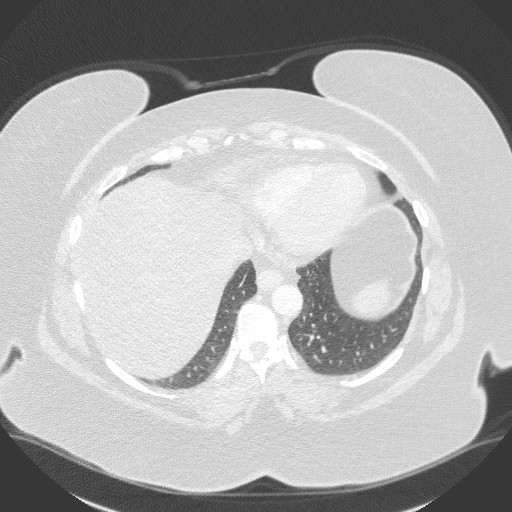
[im 124/309  soft-tissue]
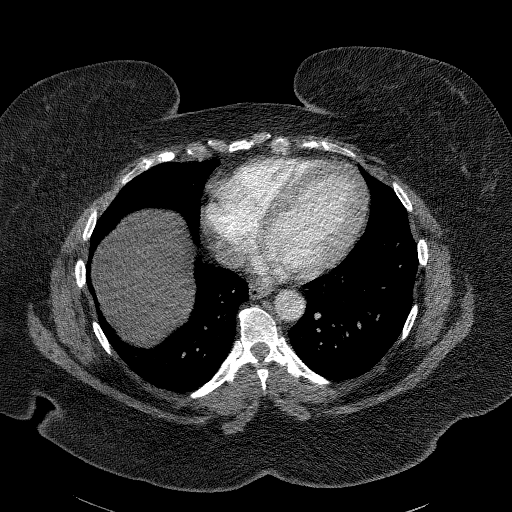
[im 144/309  lung]
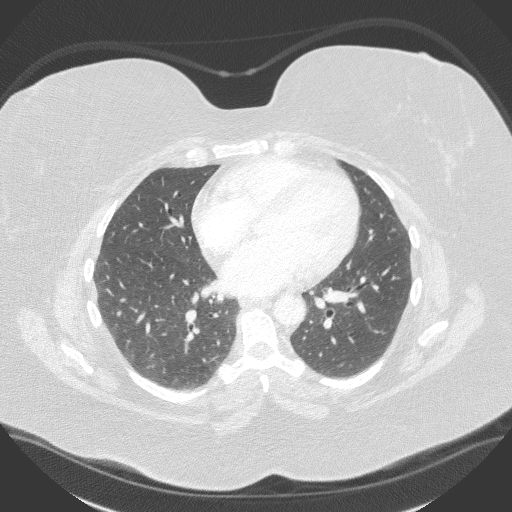
[im 165/309  soft-tissue]
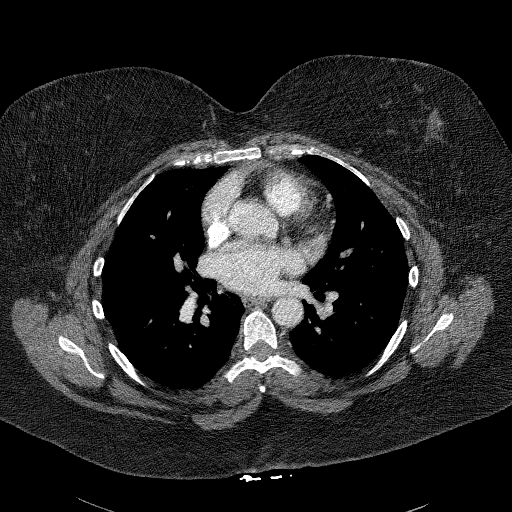
[im 185/309  lung]
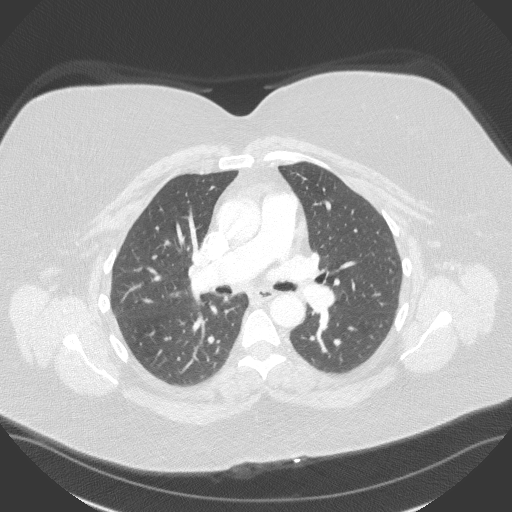
[im 206/309  soft-tissue]
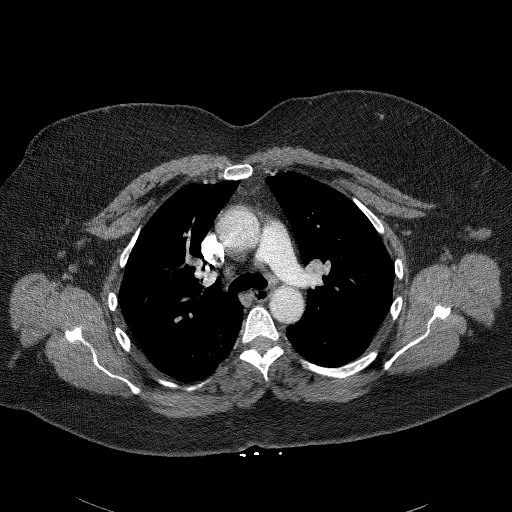
[im 226/309  lung]
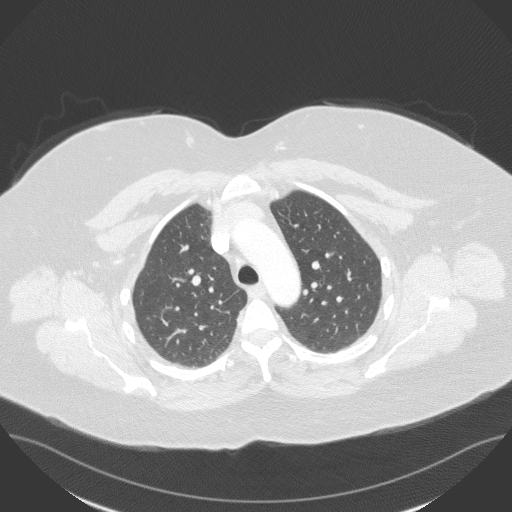
[im 247/309  soft-tissue]
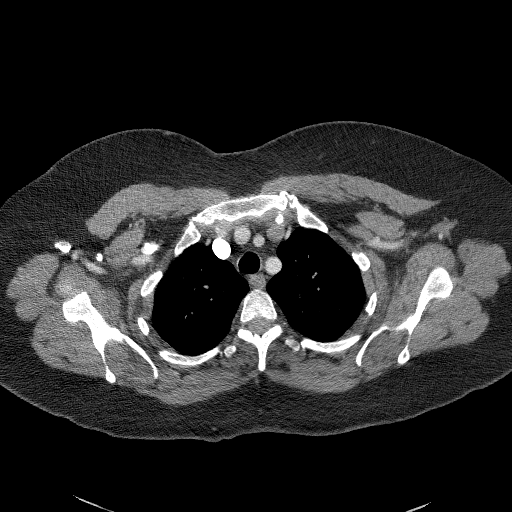
[im 267/309  lung]
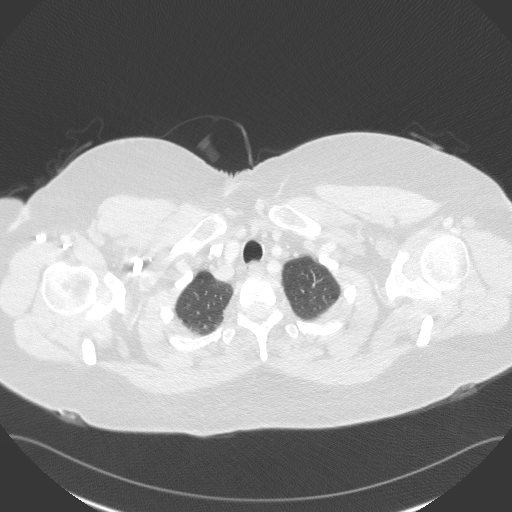
[im 288/309  soft-tissue]
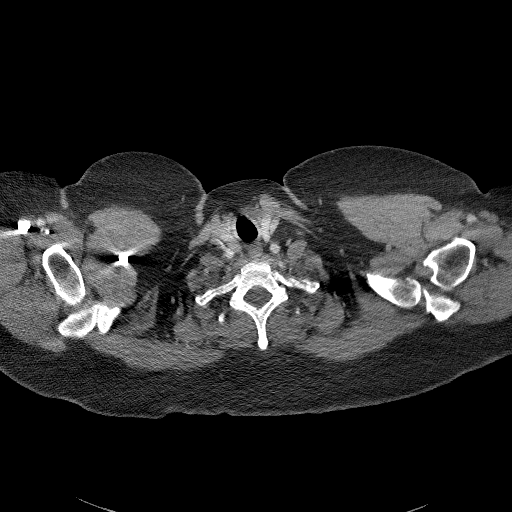

[Series 7: cor soft · coronal · 0.60mm/px · 3 of 178 slices shown]
[im 45/178  soft-tissue]
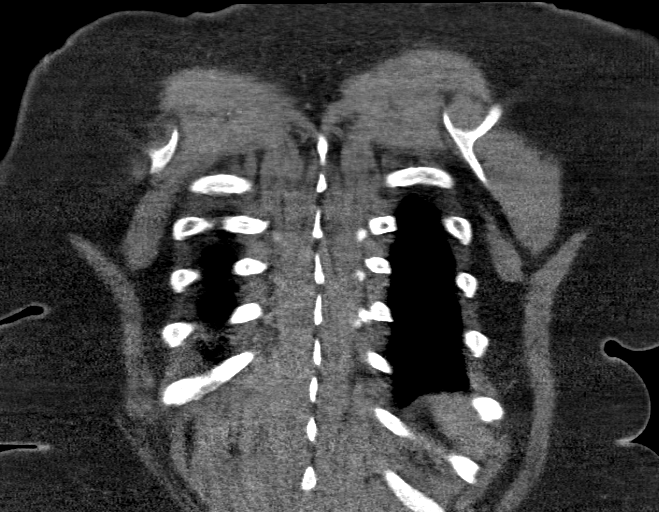
[im 89/178  soft-tissue]
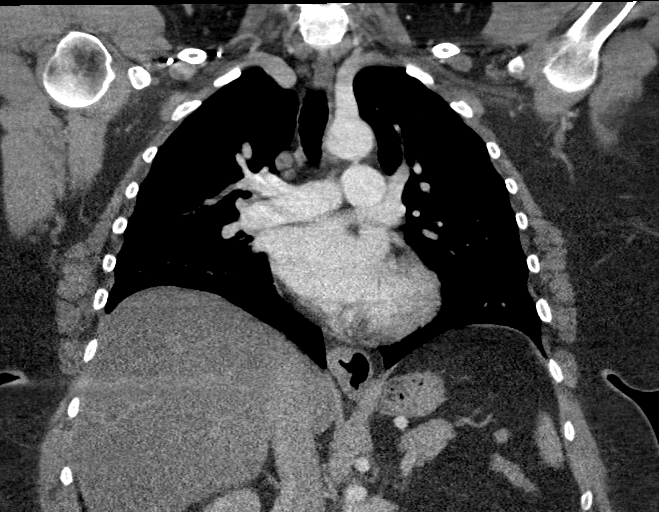
[im 133/178  soft-tissue]
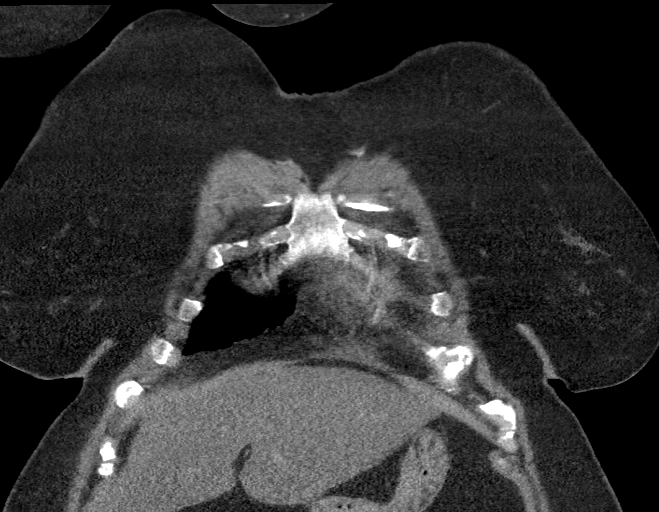

[17 of 46 positions shown; findings below may reference images not displayed]

FINDINGS: Cardiovascular: Satisfactory opacification of the pulmonary arteries
to the segmental level. No evidence of pulmonary embolism. Normal
heart size. No pericardial effusion.

Mediastinum/Nodes: No enlarged mediastinal, hilar, or axillary lymph
nodes. Thyroid gland, trachea, and esophagus demonstrate no
significant findings.

Lungs/Pleura: Lungs are clear. No pleural effusion or pneumothorax.

Upper Abdomen: Open there is a small hiatal hernia.

Multiple surgical clips are seen within the gallbladder fossa.

Musculoskeletal: No chest wall abnormality. No acute or significant
osseous findings.

Review of the MIP images confirms the above findings.
IMPRESSION: 1. No CT evidence of pulmonary embolism.
2. Small hiatal hernia.
3. Evidence of prior cholecystectomy.

## 2023-05-12 ENCOUNTER — Ambulatory Visit (INDEPENDENT_AMBULATORY_CARE_PROVIDER_SITE_OTHER): Payer: PPO

## 2023-05-12 ENCOUNTER — Ambulatory Visit: Payer: PPO | Admitting: Adult Health

## 2023-05-12 VITALS — BP 117/74 | HR 94 | Temp 97.7°F | Ht 63.0 in | Wt 270.4 lb

## 2023-05-12 DIAGNOSIS — J4530 Mild persistent asthma, uncomplicated: Secondary | ICD-10-CM | POA: Diagnosis not present

## 2023-05-12 DIAGNOSIS — G4733 Obstructive sleep apnea (adult) (pediatric): Secondary | ICD-10-CM

## 2023-05-12 DIAGNOSIS — J309 Allergic rhinitis, unspecified: Secondary | ICD-10-CM | POA: Diagnosis not present

## 2023-05-12 DIAGNOSIS — J45909 Unspecified asthma, uncomplicated: Secondary | ICD-10-CM | POA: Diagnosis not present

## 2023-05-12 MED ORDER — PREDNISONE 20 MG PO TABS: 20.0000 mg | ORAL_TABLET | Freq: Every day | ORAL | 0 refills | Status: DC

## 2023-05-12 MED ORDER — FLUTICASONE-SALMETEROL 250-50 MCG/ACT IN AEPB: 1.0000 | INHALATION_SPRAY | Freq: Two times a day (BID) | RESPIRATORY_TRACT | 5 refills | Status: DC

## 2023-05-12 MED ORDER — AMOXICILLIN-POT CLAVULANATE 875-125 MG PO TABS: 1.0000 | ORAL_TABLET | Freq: Two times a day (BID) | ORAL | 0 refills | Status: DC

## 2023-05-12 NOTE — Patient Instructions (Addendum)
 Chest xray  Augmentin  875mg  Twice daily  for 1 week (take with food)  Prednisone  20mg  daily for 5 days.  Saline nasal rinses As needed   Change Breo to [place] 1 puff Twice daily-rinse after use. (Due to Insurance)  Continue on Zyrtec and Singulair  daily  Continue Astelin  and Flonase  daily  Albuterol  inhaler As needed   Asthma action plan as discussed.  Continue on Omeprazole  daily .  Refer to allergy   Continue on CPAP At bedtime  and follow up with Neuology.  Follow up with Dr. Kara or Renee Wolven NP in 6 months (30 min slot ) .  Please contact office for sooner follow up if symptoms do not improve or worsen or seek emergency care

## 2023-05-12 NOTE — Progress Notes (Signed)
 @Patient  ID: Renee Flores, female    DOB: 05-Nov-1965, 58 y.o.   MRN: [id number]    Referring provider: Mahlon Comer BRAVO, MD  HPI: 58 year old female followed for mild persistent asthma and allergic rhinitis Medical history significant for sleep apnea on CPAP followed by neurology  TEST/EVENTS :  RAST panel June 04, 2022 essentially negative Absolute eosinophil count March 17, 2022 900   Asthma workup:  Previous exposure to mold in home and possible asbestos.(Shingles)    PFTs done on June 24, 2022 showed no significant airflow obstruction or restriction. FEV1 at 91%, ratio 90, FVC 79%, positive bronchodilator response, DLCO 114%    CT chest was negative for ILD. Did show some mild tracheobronchomalacia on expiratory phase.   05/12/2023 Follow up: Asthma and Allergic Rhinitis  Patient presents for a 58-month follow-up.  Patient is followed for asthma with allergic phenotype and allergic rhinitis.  She is on maintenance regimen with Breo inhaler daily.  She takes Singulair , Zyrtec Astelin  and Flonase .  Patient complains over the last several weeks that she has had increased cough with thick green mucus intermittent wheezing.  She remains on Breo but says that she needs an alternative as her insurance will no longer cover this.  She is requesting a referral to allergist.  Previous allergy  panel was essentially negative. Patient denies any fever, chest pain, orthopnea, edema.  Allergies  Allergen Reactions   Hydrochlorothiazide  Nausea And Vomiting    Other reaction(s): Other (See Comments) Leg Cramps Leg Cramps  Other reaction(s): Other (See Comments) Leg Cramps   Morphine Other (See Comments), Rash and Nausea And Vomiting    Hallucinating  Other reaction(s): Other (See Comments) Hallucinating Hallucinating Hallucinating Hallucinating  Hallucinating  Hallucinating  Other reaction(s): Other (See Comments) Hallucinating Hallucinating   Vancomycin  Nausea And  Vomiting    Ringing in ears   Erythromycin Base Hives   Erythromycin Base Hives   Vancomycin  Hcl Other (See Comments)    Ringing in ears   Doxycycline  Nausea Only and Rash    Immunization History  Administered Date(s) Administered   Influenza, Seasonal, Injecte, Preservative Fre 03/20/2023   Moderna Sars-Covid-2 Vaccination 08/05/2019, 09/01/2019, 04/05/2020   Tdap 04/28/2006, 09/12/2021    Past Medical History:  Diagnosis Date   Allergy     Arthritis    Bulging lumbar disc    L4-5   Complication of anesthesia    anxiety attack after waking up from anesthesia   Factor 5 Leiden mutation, heterozygous (HCC)    Factor V deficiency, congenital (HCC) 03/23/2012   GERD (gastroesophageal reflux disease)    Heart murmur    Heart palpitations    Heart valve problem    mild- moderate mitral valve leakage    High cholesterol    HYPERCHOLESTEROLEMIA 02/27/2010   Qualifier: Diagnosis of  By: Lutricia RN, Denise     Hypersomnia with sleep apnea, unspecified 11/03/2013   Hypertension    Lumbar radiculopathy 08/12/2018   MS (multiple sclerosis) (HCC)    questionable   Neuromuscular disorder (HCC)    OSA on CPAP 03/25/2017   Osteopenia 04/2016   T score -1.9   Osteoporosis    Preop cardiovascular exam 03/23/2012   Retrognathia 11/03/2013   Right knee DJD 07/08/2012   Severe obesity (BMI >= 40) (HCC) 11/03/2013   Shortness of breath 06/03/2013   Sleep apnea    Vitamin D  deficiency 2014   17    Tobacco History: Social History   Tobacco Use  Smoking Status Never  Passive exposure: Never  Smokeless Tobacco Never   Counseling given: Not Answered   Outpatient Medications Prior to Visit  Medication Sig Dispense Refill   aspirin  EC 81 MG tablet Take 81 mg by mouth daily.     cloNIDine  (CATAPRES ) 0.1 MG tablet Take 1 tablet (0.1 mg total) by mouth 3 (three) times daily. 270 tablet 10   cyclobenzaprine  (FLEXERIL ) 5 MG tablet Take 1 tablet (5 mg total) by mouth 3 (three) times daily as  needed for muscle spasms. 30 tablet 1   fluticasone  (FLONASE ) 50 MCG/ACT nasal spray Place 2 sprays into both nostrils daily. (Patient taking differently: Place 2 sprays into both nostrils daily as needed for allergies.) 16 g 6   fluticasone  furoate-vilanterol (BREO ELLIPTA ) 200-25 MCG/ACT AEPB Inhale 1 puff into the lungs daily. 60 each 5   gabapentin  (NEURONTIN ) 300 MG capsule Take 2 capsules (600 mg total) by mouth at bedtime. 180 capsule 1   ketotifen  (ZADITOR ) 0.025 % ophthalmic solution Place 2 drops into both eyes daily as needed (for irritation). 5 mL 0   losartan  (COZAAR ) 50 MG tablet Take 1 tablet (50 mg total) by mouth 2 (two) times daily. 180 tablet 2   meloxicam  (MOBIC ) 15 MG tablet Take 1 tablet (15 mg total) by mouth daily. 90 tablet 1   metoprolol  succinate (TOPROL -XL) 50 MG 24 hr tablet Take 1 tablet (50 mg total) by mouth daily. 90 tablet 3   montelukast  (SINGULAIR ) 10 MG tablet Take 1 tablet (10 mg total) by mouth at bedtime. 90 tablet 1   Multiple Vitamins-Minerals (ONE-A-DAY WOMENS 50+ ADVANTAGE PO) Take 1 tablet by mouth daily. Unknown strength     omeprazole  (PRILOSEC) 40 MG capsule TAKE 1 CAPSULE EVERY DAY 90 capsule 3   ondansetron  (ZOFRAN ) 4 MG tablet Take 1 tablet (4 mg total) by mouth every 8 (eight) hours as needed for nausea or vomiting. 20 tablet 0   simvastatin  (ZOCOR ) 40 MG tablet Take 1 tablet (40 mg total) by mouth at bedtime. 30 tablet 0   spironolactone  (ALDACTONE ) 25 MG tablet TAKE 1/2 TABLET BY MOUTH EVERY DAY 45 tablet 3   No facility-administered medications prior to visit.     Review of Systems:   Constitutional:   No  weight loss, night sweats,  Fevers, chills, fatigue, or  lassitude.  HEENT:   No headaches,  Difficulty swallowing,  Tooth/dental problems, or  Sore throat,                No sneezing, itching, ear ache,+ nasal congestion, post nasal drip,   CV:  No chest pain,  Orthopnea, PND, swelling in lower extremities, anasarca, dizziness,  palpitations, syncope.   GI  No heartburn, indigestion, abdominal pain, nausea, vomiting, diarrhea, change in bowel habits, loss of appetite, bloody stools.   Resp: No chest wall deformity  Skin: no rash or lesions.  GU: no dysuria, change in color of urine, no urgency or frequency.  No flank pain, no hematuria   MS:  No joint pain or swelling.  No decreased range of motion.  No back pain.    Physical Exam GEN: A/Ox3; pleasant , NAD, well nourished    HEENT:  [place]/AT,  EACs-clear, TMs-wnl, NOSE-clear, THROAT-clear, no lesions, no postnasal drip or exudate noted.   NECK:  Supple w/ fair ROM; no JVD; normal carotid impulses w/o bruits; no thyromegaly or nodules palpated; no lymphadenopathy.    RESP few trace rhonchi.   no accessory muscle use, no dullness to percussion  CARD:  RRR, no m/r/g, no peripheral edema, pulses intact, no cyanosis or clubbing.  GI:   Soft & nt; nml bowel sounds; no organomegaly or masses detected.   Musco: Warm bil, no deformities or joint swelling noted.   Neuro: alert, no focal deficits noted.    Skin: Warm, no lesions or rashes    Lab Results:  CBC  No results found for: PROBNP  Imaging: No results found.  Administration History     None          Latest Ref Rng & Units 06/24/2022    2:45 PM  PFT Results  FVC-Pre L 2.71   FVC-Predicted Pre % 81   FVC-Post L 2.64   FVC-Predicted Post % 79   Pre FEV1/FVC % % 77   Post FEV1/FCV % % 90   FEV1-Pre L 2.10   FEV1-Predicted Pre % 80   FEV1-Post L 2.38   DLCO uncorrected ml/min/mmHg 22.88   DLCO UNC% % 114   DLCO corrected ml/min/mmHg 22.88   DLCO COR %Predicted % 114   DLVA Predicted % 127   TLC L 4.53   TLC % Predicted % 92   RV % Predicted % 81     No results found for: NITRICOXIDE      Assessment & Plan:   No problem-specific Assessment & Plan notes found for this encounter.     Madelin Stank, NP 05/12/2023

## 2023-05-13 ENCOUNTER — Telehealth: Payer: Self-pay | Admitting: *Deleted

## 2023-05-13 ENCOUNTER — Encounter: Payer: Self-pay | Admitting: Adult Health

## 2023-05-13 NOTE — Assessment & Plan Note (Signed)
Continue on CPAP at bedtime continue follow-up with neurology

## 2023-05-13 NOTE — Telephone Encounter (Signed)
 Left message detailed message on patient primary phone to bring cpap machine along with powercord to visit on 05/14/23

## 2023-05-13 NOTE — Assessment & Plan Note (Signed)
 Continue on current maintenance regimen.  Referral to allergist per her request.  Plan Patient Instructions  Chest xray  Augmentin  875mg  Twice daily  for 1 week (take with food)  Prednisone  20mg  daily for 5 days.  Saline nasal rinses As needed   Change Breo to [place] 1 puff Twice daily-rinse after use. (Due to Insurance)  Continue on Zyrtec and Singulair  daily  Continue Astelin  and Flonase  daily  Albuterol  inhaler As needed   Asthma action plan as discussed.  Continue on Omeprazole  daily .  Refer to allergy   Continue on CPAP At bedtime  and follow up with Neuology.  Follow up with Dr. Diania Fortes or Dusty Wagoner NP in 6 months (30 min slot ) .  Please contact office for sooner follow up if symptoms do not improve or worsen or seek emergency care

## 2023-05-13 NOTE — Assessment & Plan Note (Signed)
 Acute asthmatic bronchitic exacerbation.  Will treat with a 7-day course of Augmentin  and short prednisone  burst. Continue with asthma action plan.  Will change Breo over to [place] which is on her insurance formulary.  Plan Patient Instructions  Chest xray  Augmentin  875mg  Twice daily  for 1 week (take with food)  Prednisone  20mg  daily for 5 days.  Saline nasal rinses As needed   Change Breo to [place] 1 puff Twice daily-rinse after use. (Due to Insurance)  Continue on Zyrtec and Singulair  daily  Continue Astelin  and Flonase  daily  Albuterol  inhaler As needed   Asthma action plan as discussed.  Continue on Omeprazole  daily .  Refer to allergy   Continue on CPAP At bedtime  and follow up with Neuology.  Follow up with Dr. Diania Fortes or Philo Kurtz NP in 6 months (30 min slot ) .  Please contact office for sooner follow up if symptoms do not improve or worsen or seek emergency care

## 2023-05-14 ENCOUNTER — Ambulatory Visit: Payer: PPO | Admitting: Neurology

## 2023-05-14 ENCOUNTER — Encounter: Payer: Self-pay | Admitting: Neurology

## 2023-05-14 VITALS — BP 138/83 | HR 82 | Ht 63.0 in | Wt 270.0 lb

## 2023-05-14 DIAGNOSIS — G4733 Obstructive sleep apnea (adult) (pediatric): Secondary | ICD-10-CM | POA: Diagnosis not present

## 2023-05-14 DIAGNOSIS — D6851 Activated protein C resistance: Secondary | ICD-10-CM

## 2023-05-14 NOTE — Progress Notes (Signed)
Provider:  Melvyn Novas, MD  Primary Care Physician:  Sheliah Hatch, MD [address] [place] [address]     Referring Provider: Sheliah Hatch, Md [address] [place],  [place] [address]          Chief Complaint according to patient   Patient presents with:    HPI:  HPI: 58 year old female followed for mild persistent asthma and allergic rhinitis Medical history significant for sleep apnea on CPAP followed by Neurology.             HISTORY OF PRESENT ILLNESS:  Renee Flores is a 58 y.o. female SLEEP APNEA patient who is here for revisit 05/14/2023 , addressing CPAP compliance,  she is CPAP dependent with a main risk factor of obesity and asthma. She is prediabetic and has neuropathy. When she was originally referred for sleep in 2013 she endorsed Epworth scores of 16, 17 etc.  Dr Beverely Low has initiated an additional  work up for asthma/ allergies.  Pulmonology changed her inhaler ( due to costs rising with this years formulary)  and since October she has mucous and drainage all the time.  Has amoxicillin and prednisone prescribed and takes it since yesterday. Headaches when she takes prednisone and tremors.  Chief concern according to patient :  "I can't sleep without my CPAP,  and it helps me breathe. My lungs feel tight.  I take gabapentin for  sciatic , back and leg pain and was once worked up for MS by Dr Orlin Hilding. A second opinion at [facility] felt that there were not enough indicators for MS". She has not had any recent MRI brain.      07/25/14: history of headaches, cervical tenderness and right-sided weakness of unknown etiology. She had been followed at [facility] since July 2001, Dr. Orlin Hilding .  The patient had been evaluated for mini strokes and multiple sclerosis. An MRI study was normal . Nerve conduction and EMGs  studies did not show peripheral neuropathy, a lumbar puncture and evaluation of cerebral spinal fluid did not show oligoclonal  banding and was negative for Lyme disease as well. Dr. Teola Bradley At [facility] saw her for a second opinion but did not feel that she had multiple sclerosis. She was initiated on gabapentin in 2001 and repeat MRIs (the latest of March 2005 ) were Normal.   She was followed Dr. Orlin Hilding and then CM- For joint pain, difficult sleep it sleeping due to anxiety and depression but her daytime sleepiness became an impairment for her daily functioning and productivity. The headaches are now in good control: [name], [facility] opted for a sleep study as indicated to further evaluate the reason for the excessive daytime hypersomnia.   Patient goes to bed between 10 PM and 10.30, but it may take an hour to reach sustained sleep, waking frequently.   Dreams; yes , but not recalling dreams, some dream intrusion, no sleep paralysis, no cataplexy.   The patient is to rise at 4 AM to her nocturnal sleep time is less than 6 hours. She will take afternoon- Naps during the week in frequently, but she does now on weekends. She says she has a hard time waking up in the morning on arising off but she feels not refreshed and would like to take another 20 or 30 minutes of sleep time after a lumbar water. He relies on alarm. She endorsed today the Epworth sleepiness  score at 18 fatigue at 53 points.    The study was performed on 07-14-13. The patient's Epworth sleepiness score at 19 points the patient health questionnaire at 18 points indicating also a moderate severe depression.   BMI was 39.0, neck circumference measured 14.5 inches.   The patient's AHI was 6.7 and the RDI 7.0 during REM sleep -however AHI was 36 and in supine sleep her AHI was 30.4 per hour of sleep.   Oxygen nadir was 74% for total time of 17.8 minutes. Heart rate was regular and normal sinus rhythm. There were not many PLMs. The study revealed such mild obstructive sleep apnea but I discussed it with CPAP should even be performed. Positive pressure therapy is an option  given that the patient has excessive daytime sleepiness and I ordered for her to undergo CPAP titration f then see [place] for a revisit in 30 days of use.   On-05-31-11 the patient underwent CPAP titration with reduced her AHI to 0.3 and the oxygen nadir rose to 91%. She was titrated to 7 cm CPAP at night and slept 183 minutes of which 32 minutes were in REM sleep. She had some restless legs now, leg cramping at home.    Review of Systems: Out of a complete 14 system review, the patient complains of only the following symptoms, and all other reviewed systems are negative.:  Fatigue, sleepiness , snoring, fragmented sleep, asthma.    How likely are you to doze in the following situations: 0 = not likely, 1 = slight chance, 2 = moderate chance, 3 = high chance   Sitting and Reading? Watching Television? Sitting inactive in a public place (theater or meeting)? As a passenger in a car for an hour without a break? Lying down in the afternoon when circumstances permit? Sitting and talking to someone? Sitting quietly after lunch without alcohol? In a car, while stopped for a few minutes in traffic?   Total = 8/ 24 points   FSS endorsed at 36/ 63 points.   Social History   Socioeconomic History   Marital status: Widowed    Spouse name: [name]   Number of children: 2   Years of education: 12   Highest education level: 12th grade  Occupational History    Employer: [employer]  Tobacco Use   Smoking status: Never    Passive exposure: Never   Smokeless tobacco: Never  Vaping Use   Vaping status: Never Used  Substance and Sexual Activity   Alcohol use: Not Currently    Alcohol/week: 0.0 standard drinks of alcohol   Drug use: No   Sexual activity: Yes    Birth control/protection: Surgical    Comment: HYST-1st intercourse 58 yo-Fewer than 5 partners  Other Topics Concern   Not on file  Social History Narrative   [name]  and lives at home with her husband.    Patient has two children.   Patient works as a Product/process development scientist for [employer].   Patient drinks one cup of caffeine daily.   Patient is right-handed.   Patient has a high school education.   Social Drivers of Corporate investment banker Strain: Low Risk  (03/19/2023)   Overall Financial Resource Strain (CARDIA)    Difficulty of Paying Living Expenses: Not hard at all  Food Insecurity: No Food Insecurity (03/19/2023)   Hunger Vital Sign    Worried About Running Out of Food in the Last Year: Never true  Ran Out of Food in the Last Year: Never true  Transportation Needs: No Transportation Needs (03/19/2023)   PRAPARE - Administrator, Civil Service (Medical): No    Lack of Transportation (Non-Medical): No  Physical Activity: Insufficiently Active (03/19/2023)   Exercise Vital Sign    Days of Exercise per Week: 3 days    Minutes of Exercise per Session: 30 min  Stress: Stress Concern Present (03/19/2023)   [employer] of Occupational Health - Occupational Stress Questionnaire    Feeling of Stress : To some extent  Social Connections: Moderately Integrated (03/19/2023)   Social Connection and Isolation Panel [NHANES]    Frequency of Communication with Friends and Family: More than three times a week    Frequency of Social Gatherings with Friends and Family: More than three times a week    Attends Religious Services: More than 4 times per year    Active Member of [employer] or Organizations: Yes    Attends Banker Meetings: More than 4 times per year    Marital Status: Widowed    Family History  Problem Relation Age of Onset   Heart disease Father    Hypertension Father    Hyperlipidemia Father    Cancer Father    Other Father        Respiratory problems/Factor 5 disorder/1 bypass, 2 valve replacement (mitral and aortic)    Hypertension Mother    Diabetes Mother    Hyperlipidemia Mother    Hypertension Sister    Colon cancer Paternal Aunt     Breast cancer Paternal Aunt 50   Heart disease Maternal Grandfather    Lymphoma Maternal Grandfather    Heart failure Maternal Grandfather    Colon cancer Paternal Grandmother    Hyperlipidemia Brother    Hypertension Brother    Hypertension Brother    Colon cancer Paternal Aunt    Breast cancer Paternal Aunt 65   Stroke Paternal Grandfather     Past Medical History:  Diagnosis Date   Allergy    Arthritis    Bulging lumbar disc    L4-5   Complication of anesthesia    anxiety attack after waking up from anesthesia   Factor 5 Leiden mutation, heterozygous (HCC)    Factor V deficiency, congenital (HCC) 03/23/2012   GERD (gastroesophageal reflux disease)    Heart murmur    Heart palpitations    Heart valve problem    mild- moderate mitral valve leakage    High cholesterol    HYPERCHOLESTEROLEMIA 02/27/2010   Qualifier: Diagnosis of  By: Lennox Laity RN, Denise     Hypersomnia with sleep apnea, unspecified 11/03/2013   Hypertension    Lumbar radiculopathy 08/12/2018   MS (multiple sclerosis) (HCC)    questionable   Neuromuscular disorder (HCC)    OSA on CPAP 03/25/2017   Osteopenia 04/2016   T score -1.9   Osteoporosis    Preop cardiovascular exam 03/23/2012   Retrognathia 11/03/2013   Right knee DJD 07/08/2012   Severe obesity (BMI >= 40) (HCC) 11/03/2013   Shortness of breath 06/03/2013   Sleep apnea    Vitamin D deficiency 2014   17    Past Surgical History:  Procedure Laterality Date   ABDOMINAL HYSTERECTOMY     ANKLE FUSION Left 04/25/2021   APPENDECTOMY     arthroscopic knee surgery     CESAREAN SECTION     x2   chloecystectomy     CHOLECYSTECTOMY     ESOPHAGEAL  DILATION  04/09/2012   KNEE LIGAMENT RECONSTRUCTION     laser throat polyps     Left ankle reconstructed     NECK LESION BIOPSY     PATELLA FRACTURE SURGERY     PELVIC LAPAROSCOPY     LSO   REPLACEMENT TOTAL KNEE Left 2012   REPLACEMENT TOTAL KNEE Right 2014   TOE SURGERY     TONSILLECTOMY      TONSILLECTOMY  1979   TOTAL ABDOMINAL HYSTERECTOMY W/ BILATERAL SALPINGOOPHORECTOMY  2000   TOTAL KNEE ARTHROPLASTY Left 2012   TOTAL KNEE ARTHROPLASTY Right 07/08/2012   Procedure: TOTAL KNEE ARTHROPLASTY with revision components ;  Surgeon: Velna Ochs, MD;  Location: [facility] OR;  Service: Orthopedics;  Laterality: Right;  PATIENT HAS HX. OF FACTOR IV DISORFER   TUBAL LIGATION       Current Outpatient Medications on File Prior to Visit  Medication Sig Dispense Refill   amoxicillin-clavulanate (AUGMENTIN) 875-125 MG tablet Take 1 tablet by mouth 2 (two) times daily. 14 tablet 0   aspirin EC 81 MG tablet Take 81 mg by mouth daily.     cloNIDine (CATAPRES) 0.1 MG tablet Take 1 tablet (0.1 mg total) by mouth 3 (three) times daily. 270 tablet 10   cyclobenzaprine (FLEXERIL) 5 MG tablet Take 1 tablet (5 mg total) by mouth 3 (three) times daily as needed for muscle spasms. 30 tablet 1   fluticasone (FLONASE) 50 MCG/ACT nasal spray Place 2 sprays into both nostrils daily. (Patient taking differently: Place 2 sprays into both nostrils daily as needed for allergies.) 16 g 6   fluticasone-salmeterol (WIXELA INHUB) 250-50 MCG/ACT AEPB Inhale 1 puff into the lungs in the morning and at bedtime. 60 each 5   gabapentin (NEURONTIN) 300 MG capsule Take 2 capsules (600 mg total) by mouth at bedtime. 180 capsule 1   ketotifen (ZADITOR) 0.025 % ophthalmic solution Place 2 drops into both eyes daily as needed (for irritation). 5 mL 0   losartan (COZAAR) 50 MG tablet Take 1 tablet (50 mg total) by mouth 2 (two) times daily. 180 tablet 2   meloxicam (MOBIC) 15 MG tablet Take 1 tablet (15 mg total) by mouth daily. 90 tablet 1   metoprolol succinate (TOPROL-XL) 50 MG 24 hr tablet Take 1 tablet (50 mg total) by mouth daily. 90 tablet 3   montelukast (SINGULAIR) 10 MG tablet Take 1 tablet (10 mg total) by mouth at bedtime. 90 tablet 1   Multiple Vitamins-Minerals (ONE-A-DAY WOMENS 50+ ADVANTAGE PO) Take 1 tablet by  mouth daily. Unknown strength     omeprazole (PRILOSEC) 40 MG capsule TAKE 1 CAPSULE EVERY DAY 90 capsule 3   ondansetron (ZOFRAN) 4 MG tablet Take 1 tablet (4 mg total) by mouth every 8 (eight) hours as needed for nausea or vomiting. 20 tablet 0   predniSONE (DELTASONE) 20 MG tablet Take 1 tablet (20 mg total) by mouth daily with breakfast. 5 tablet 0   simvastatin (ZOCOR) 40 MG tablet Take 1 tablet (40 mg total) by mouth at bedtime. 30 tablet 0   spironolactone (ALDACTONE) 25 MG tablet TAKE 1/2 TABLET BY MOUTH EVERY DAY 45 tablet 3   No current facility-administered medications on file prior to visit.    Allergies  Allergen Reactions   Hydrochlorothiazide Nausea And Vomiting    Other reaction(s): Other (See Comments) Leg Cramps Leg Cramps  Other reaction(s): Other (See Comments) Leg Cramps   Morphine Other (See Comments), Rash and Nausea And Vomiting  Hallucinating  Other reaction(s): Other (See Comments) Hallucinating Hallucinating Hallucinating Hallucinating  Hallucinating  Hallucinating  Other reaction(s): Other (See Comments) Hallucinating Hallucinating   Vancomycin Nausea And Vomiting    Ringing in ears   Erythromycin Base Hives   Erythromycin Base Hives   Vancomycin Hcl Other (See Comments)    Ringing in ears   Doxycycline Nausea Only and Rash     DIAGNOSTIC DATA (LABS, IMAGING, TESTING) - I reviewed patient records, labs, notes, testing and imaging myself where available.  Lab Results  Component Value Date   WBC 7.8 03/20/2023   HGB 12.7 03/20/2023   HCT 39.9 03/20/2023   MCV 89.0 03/20/2023   PLT 250.0 03/20/2023      Component Value Date/Time   NA 138 03/20/2023 0854   NA 141 01/10/2022 1054   K 4.1 03/20/2023 0854   CL 100 03/20/2023 0854   CO2 30 03/20/2023 0854   GLUCOSE 105 (H) 03/20/2023 0854   BUN 16 03/20/2023 0854   BUN 12 01/10/2022 1054   CREATININE 0.78 03/20/2023 0854   CALCIUM 9.7 03/20/2023 0854   PROT 7.4 03/20/2023 0854    ALBUMIN 4.4 03/20/2023 0854   AST 28 03/20/2023 0854   ALT 36 (H) 03/20/2023 0854   ALKPHOS 75 03/20/2023 0854   BILITOT 0.6 03/20/2023 0854   GFRNONAA >60 04/27/2021 2240   GFRAA >90 06/01/2013 0925   Lab Results  Component Value Date   CHOL 158 03/20/2023   HDL 52.00 03/20/2023   LDLCALC 73 03/20/2023   LDLDIRECT 84.0 09/16/2022   TRIG 166.0 (H) 03/20/2023   CHOLHDL 3 03/20/2023   Lab Results  Component Value Date   HGBA1C 6.1 09/16/2022   No results found for: "VITAMINB12" Lab Results  Component Value Date   TSH 2.07 03/20/2023    PHYSICAL EXAM:  BP today :   Today's Vitals   05/14/23 1020  Weight: 270 lb (122.5 kg)  Height: 5\' 3"  (1.6 m)   Body mass index is 47.83 kg/m.   Wt Readings from Last 3 Encounters:  05/14/23 270 lb (122.5 kg)  05/12/23 270 lb 6.4 oz (122.7 kg)  03/20/23 269 lb 6 oz (122.2 kg)     Ht Readings from Last 3 Encounters:  05/14/23 5\' 3"  (1.6 m)  05/12/23 5\' 3"  (1.6 m)  03/20/23 5\' 3"  (1.6 m)      General: The patient is awake, alert and appears not in acute distress. The patient is well groomed. Head: Normocephalic, atraumatic. Neck is supple.  Mallampati 3,  neck circumference: inches .16"   Nasal airflow not patent. Congested, post nasal drip. Sinus congestion.   Retrognathia with overbite is seen.  Dental status: biological, overbite.  Cardiovascular:  Regular rate and cardiac rhythm by pulse,  without distended neck veins. Respiratory: Lungs are clear to auscultation.  Skin:  Without evidence of ankle edema, or rash. Trunk: The patient's posture is erect.   NEUROLOGIC EXAM: The patient is awake and alert, oriented to place and time.   Memory subjective described as intact.  Attention span & concentration ability appears normal.  Speech is fluent,  with dysarthria, dysphonia , hoarse and nasal speech  Mood and affect are appropriate.   Cranial nerves: no loss of smell or taste reported  Pupils are equal and briskly  reactive to light.  Funduscopic exam deferred.  Extraocular movements in vertical and horizontal planes were intact and without nystagmus. No Diplopia. Visual fields by finger perimetry are intact. Hearing was intact to  soft voice and finger rubbing.    Facial sensation intact to fine touch.  Facial motor strength is symmetric and tongue and uvula move midline.  Neck ROM : rotation, tilt and flexion extension were normal for age and shoulder shrug was symmetrical.    Motor exam:  Symmetric bulk, tone and ROM.   Normal tone without cog -wheeling, symmetric grip strength .   Sensory:  Fine touch, pinprick and vibration were  normal in her hands  She had ankle surgery and knee replacements, affecting sensation.  Proprioception tested in the upper extremities was normal.   Coordination: Rapid alternating movements in the fingers/hands were of normal speed.  The Finger-to-nose maneuver was intact without evidence of ataxia, dysmetria or tremor.   Gait and station: Patient could rise unassisted from a seated position, walked without assistive device.   Deep tendon reflexes: in the  upper  extremities are symmetric and intact.      ASSESSMENT AND PLAN 58 y.o. year old female  here with: mild OSA but CPAP dependent     1) CPAP dependence in the setting of mild sleep apnea.- hypopnea at an AHI of only 7/ h but  her EDS justified treatment.   She was titrated to CPAP in May 2015 at [facility], titrate to only 7 cm water, and placed nuance nasal pillow was used, and still is.   She has a severe overbite, partial upper dentures (bridge) and crowded lower jaw.  She also has the ongoing risk factor of large neck, high BMI of 47.8 , and Mallampati.   There is still a lot of work to do in terms of diet, she drinks  Sweet tea, with sugar and Pepsi .  We spoke about increasing metabolism by exercise and reduce the sugar intake, lean protein.   2) She reports ear pain TMJ? Right tinnitus over  left ear, and hearing loss documented.  I don't think she can be helped by a different  interface.  3) Rv in 12 months with NP .     I would like to thank Sheliah Hatch, MD  for allowing me to meet with and to take care of this pleasant patient.     After spending a total time of  35  minutes face to face and additional time for physical and neurologic examination, review of laboratory studies,  personal review of imaging studies, reports and results of other testing and review of referral information / records as far as provided in visit,   Electronically signed by: Melvyn Novas, MD 05/14/2023 10:25 AM  [name] Board certified by The [employer] of Sleep Medicine and Diplomate of the [employer] of Sleep Medicine. Board certified In Neurology through the ABPN, Fellow of the [employer] of Neurology.

## 2023-05-14 NOTE — Patient Instructions (Signed)
ASSESSMENT AND PLAN 58 y.o. year old female  here with: mild OSA but CPAP dependent      1) CPAP dependence in the setting of mild sleep apnea.- hypopnea at an AHI of only 7/ h but  her EDS justified treatment.   She was titrated to CPAP in May 2015 at [facility], titrate to only 7 cm water, and placed nuance nasal pillow was used, and still is.    She has a severe overbite, partial upper dentures (bridge) and crowded lower jaw.   She also has the ongoing risk factor of large neck, high BMI of 47.8 , and Mallampati.   There is still a lot of work to do in terms of diet, she drinks  Sweet tea, with sugar and Pepsi .  We spoke about increasing metabolism by exercise and reduce the sugar intake, lean protein.    2) She reports ear pain TMJ? Right tinnitus over left ear, and hearing loss documented.  I don't think she can be helped by a different  interface.   3) Rv in 12 months with NP .

## 2023-05-21 ENCOUNTER — Telehealth: Payer: Self-pay | Admitting: Adult Health

## 2023-05-21 ENCOUNTER — Other Ambulatory Visit: Payer: Self-pay | Admitting: Adult Health

## 2023-05-21 MED ORDER — AMOXICILLIN-POT CLAVULANATE 875-125 MG PO TABS: 1.0000 | ORAL_TABLET | Freq: Two times a day (BID) | ORAL | 0 refills | Status: DC

## 2023-05-21 NOTE — Telephone Encounter (Signed)
LOV Augmentin 875mg  Twice daily  for 1 week (take with food)  Prednisone 20mg  daily for 5 days.  Saline nasal rinses As needed   Change Breo to Wixela 1 puff Twice daily-rinse after use. (Due to Insurance)  Continue on Zyrtec and Singulair daily  Continue Astelin and Flonase daily  Albuterol inhaler As needed   Asthma action plan as discussed.  Continue on Omeprazole daily .  Refer to allergy   We can extend Augmentin for an additional 3 days to make a 10-day course. Add liquid Mucinex dm twice daily for cough and congestion. May add chlor tabs 4 mg at bedtime as needed for postnasal drainage Continue with the above maintenance regimen If not improving will need office visit  .Please contact office for sooner follow up if symptoms do not improve or worsen or seek emergency care

## 2023-05-21 NOTE — Telephone Encounter (Signed)
See phone note -patient aware  We can extend Augmentin for an additional 3 days to make a 10-day course. Add liquid Mucinex dm twice daily for cough and congestion. May add chlor tabs 4 mg at bedtime as needed for postnasal drainage Continue with the above maintenance regimen If not improving will need office visit   .Please contact office for sooner follow up if symptoms do not improve or worsen or seek emergency care

## 2023-05-21 NOTE — Telephone Encounter (Signed)
I called and spoke with the pt  She states that she finished her augmentin and pred and is taking her meds as directed  She is still bothered by PND and has a prod cough with green sputum  She denies any wheezing, increased SOB, fevers, aches  She states that Tammy told her to call if still having symptoms after abx  Please advise, thanks!  Allergies  Allergen Reactions   Hydrochlorothiazide Nausea And Vomiting    Other reaction(s): Other (See Comments) Leg Cramps Leg Cramps  Other reaction(s): Other (See Comments) Leg Cramps   Morphine Other (See Comments), Rash and Nausea And Vomiting    Hallucinating  Other reaction(s): Other (See Comments) Hallucinating Hallucinating Hallucinating Hallucinating  Hallucinating  Hallucinating  Other reaction(s): Other (See Comments) Hallucinating Hallucinating   Vancomycin Nausea And Vomiting    Ringing in ears   Erythromycin Base Hives   Erythromycin Base Hives   Vancomycin Hcl Other (See Comments)    Ringing in ears   Doxycycline Nausea Only and Rash

## 2023-05-21 NOTE — Telephone Encounter (Signed)
PT calling saying no CB from [facility]. Adv pt MYCHART takes too long to reach the provider. Please see cut and paste msg below and contact PT.  Also, she was issued a referral. Should she call them or wait to hear from that office?   Hi I finished my meds you gave me and you said to let you know if not better when done. I still have the mucus drain and spitting it up. Also is the allergy doctor you referred me to are they suppose to call me to make appointment or do I need to call them I don't know which doctor.   Thanks [employer]

## 2023-05-21 NOTE — Telephone Encounter (Signed)
I called and spoke with the pt and notified him of response from Tammy  She verbalized understanding  Nothing further needed

## 2023-05-22 ENCOUNTER — Other Ambulatory Visit (HOSPITAL_BASED_OUTPATIENT_CLINIC_OR_DEPARTMENT_OTHER): Payer: Self-pay

## 2023-05-22 ENCOUNTER — Encounter (HOSPITAL_BASED_OUTPATIENT_CLINIC_OR_DEPARTMENT_OTHER): Payer: Self-pay

## 2023-05-22 ENCOUNTER — Telehealth: Payer: Self-pay | Admitting: Adult Health

## 2023-05-22 ENCOUNTER — Other Ambulatory Visit: Payer: Self-pay | Admitting: *Deleted

## 2023-05-22 ENCOUNTER — Telehealth: Payer: Self-pay | Admitting: *Deleted

## 2023-05-22 MED ORDER — PREDNISONE 20 MG PO TABS: 20.0000 mg | ORAL_TABLET | Freq: Every day | ORAL | 0 refills | Status: DC

## 2023-05-22 MED ORDER — AMOXICILLIN-POT CLAVULANATE 875-125 MG PO TABS: 1.0000 | ORAL_TABLET | Freq: Two times a day (BID) | ORAL | 0 refills | Status: DC

## 2023-05-22 NOTE — Telephone Encounter (Signed)
PT states [facility] did not have her agmentin or the  Pred. She was told would be sent in. This was last night. I adv her the augmentin was called in just today. Was she supposed to get Pred because she was told we would send in "3 days of medicine." Please call PT to advise @ [phone]

## 2023-05-22 NOTE — Telephone Encounter (Signed)
Called mail order pharmacy, [facility] and cancelled order for Augmentin for 3 more days.  Agent verbalized understanding.  [facility].  Nothing further needed.

## 2023-05-27 NOTE — Progress Notes (Unsigned)
New Patient Note  RE: Renee Flores MRN: [id number] DOB: 02/26/1966 Date of Office Visit: 05/28/2023  Consult requested by: Julio Sicks, NP Primary care provider: Sheliah Hatch, MD  Chief Complaint: Sinus Problem (Upper respiratory issues often  - green chunks of mucus and drainage ) and Asthma (Pulmonology diagnosed her with asthma and gave her an albuterol inhaler and placed her on wixela )  History of Present Illness: I had the pleasure of seeing Renee Flores for initial evaluation at the [facility] on 05/28/2023. She is a 58 y.o. female, who is referred here by Wyline Copas, NP (pulm) for the evaluation of asthma and allergic rhinitis.  Discussed the use of AI scribe software for clinical note transcription with the patient, who gave verbal consent to proceed.  She has experienced sinus allergy problems for over ten years, characterized by nasal congestion, drainage, and frequent sneezing. These symptoms often persist from night into the next day. Despite treatment with antibiotics, which temporarily resolve the symptoms, they recur within a week. Since October, she has experienced more frequent issues, including green mucus drainage that she coughs up. She has recently completed multiple courses of amoxicillin and prednisone  Asthma was diagnosed following a breathing test by her pulmonologist. Symptoms include wheezing, particularly when mucus drains into her lungs, and shortness of breath, which she attributes to her weight. She uses a Wixela inhaler, one puff twice a day, and notes improvement with its use. She has not required frequent use of her rescue inhaler recently.  Allergy testing via blood work did not reveal specific allergens. She experiences exacerbations in the fall and spring, with symptoms worsened by exposure to smoke, strong scents, and dust. She has been on Singulair at night and Flonase, which help but do not completely alleviate  her symptoms during severe allergy spells.  She has a history of sinus infections occurring monthly, leading to her referral to a pulmonologist.  She has had dogs since 2013, with her current dog sleeping in her bed.  Her medical history includes prediabetes, fatty liver, Factor V Leiden, staph infection in the knee, and pneumonia. She is allergic to erythromycin, azithromycin, and doxycycline, which cause hives and ringing in the ears.     She reports symptoms of nasal congestion, rhinorrhea, PND, sneezing, itchy nose, itchy/watery eyes . Symptoms have been going on for 10+ years. The symptoms are present all year around with worsening in fall and spring. Other triggers include exposure to smoke, lavender. Anosmia: no. Headache: sometimes from TMJ. She has used Flonase, Singulair with some improvement in symptoms. Sinus infections: yes - monthly. Previous work up includes: 2024 bloodwork was negative to environmental panel. Previous ENT evaluation: yes for ear issues, no prior sinus surgery. Last eye exam: last year. History of reflux: yes - on omeprazole 40mg .  She reports symptoms of shortness of breath, coughing, wheezing for many years. Current medications include Wixela 1 puff twice a day which help.  She tried the following inhalers: Breo. Main triggers are allergies. allergies. In the last month, frequency of symptoms: daily. Frequency of SABA use: depends. Interference with physical activity: yes. In the last 12 months, emergency room visits/urgent care visits/doctor office visits or hospitalizations due to respiratory issues: no. In the last 12 months, oral steroids courses: multiple times. Lifetime history of hospitalization for respiratory issues: no. Prior intubations: no. History of pneumonia: last year. She was evaluated by pulmonologist in the past. Smoking exposure: no. Up  to date with flu vaccine: yes.  Up to date with COVID-19 vaccine: yes.  Assessment and Plan: Renee Flores is a 58  y.o. female with: Other chronic sinusitis Longstanding history of sinus congestion, drainage, and frequent sinus infections requiring antibiotics. Multiple triggers identified including strong scents and smoke. Currently on Flonase and Singulair with partial relief. Wears CPAP at night.  Return for allergy skin testing. Will make additional recommendations based on results. Start saline sinus rinses.  Keep track of infections and antibiotics use. If persistent will get bloodwork next to look at immune system.   Moderate persistent asthma without complication Diagnosed by pulmonologist with symptoms of wheezing and shortness of breath. Currently managed with Wixela inhaler. Normal alpha-1 antitrypsin level. 2025 CXR normal.  Today's spirometry was unremarkable.  Continue plan as per pulmonology. Daily controller medication(s): Continue Wixela 1 puff twice a day and rinse mouth after each use.  Continue Singulair (montelukast) 10mg  daily at night. May use albuterol rescue inhaler 2 puffs every 4 to 6 hours as needed for shortness of breath, chest tightness, coughing, and wheezing. May use albuterol rescue inhaler 2 puffs 5 to 15 minutes prior to strenuous physical activities. Monitor frequency of use.  GERD Continue lifestyle and dietary modifications. Continue omeprazole 40mg  once day - nothing to eat or drink for 20-30 minutes afterwards.   Return for Skin testing.  No orders of the defined types were placed in this encounter.  Lab Orders  No laboratory test(s) ordered today    Other allergy screening: Food allergy: no Medication allergy: yes Hymenoptera allergy: no Urticaria: no Eczema:no History of recurrent infections suggestive of immunodeficency:  Patient has history multiple infections including sinus infection, pneumonia. Patient reports multiple antibiotic use in the last 12 months. Patient has pre-diabetes mellitus and has fatty liver. Denies any history of  cancer or irradiation or history of HIV, hepatitis B or C.  Diagnostics: Spirometry:  Tracings reviewed. Her effort: Good reproducible efforts. FVC: 2.25L FEV1: 1.96L, 81% predicted FEV1/FVC ratio: 87% Interpretation: No overt abnormalities noted given today's efforts.  Please see scanned spirometry results for details. Results discussed with patient/family.   Past Medical History: Patient Active Problem List   Diagnosis Date Noted   Asthma 07/15/2022   Allergic rhinitis 07/15/2022   Fatty liver 12/31/2021   Atypical chest pain 09/19/2020   Palpitations    Osteoporosis    MS (multiple sclerosis) (HCC)    Heart valve problem    Heart murmur    GERD (gastroesophageal reflux disease)    Factor 5 Leiden mutation, heterozygous (HCC)    Complication of anesthesia    Bulging lumbar disc    Arthritis    Hypertension 04/13/2020   Lumbar radiculopathy 08/12/2018   OSA on CPAP 03/25/2017   Hypersomnia with sleep apnea 11/03/2013   Severe obesity (BMI >= 40) (HCC) 11/03/2013   Retrognathia 11/03/2013   Shortness of breath 06/03/2013   Right knee DJD 07/08/2012    Class: Chronic   Physical exam 03/23/2012   Factor V deficiency, congenital (HCC) 03/23/2012   Vitamin D deficiency    HYPERCHOLESTEROLEMIA 02/27/2010   Past Medical History:  Diagnosis Date   Allergy    Arthritis    Asthma    Bulging lumbar disc    L4-5   Complication of anesthesia    anxiety attack after waking up from anesthesia   Factor 5 Leiden mutation, heterozygous (HCC)    Factor V deficiency, congenital (HCC) 03/23/2012   GERD (gastroesophageal reflux disease)  Heart murmur    Heart palpitations    Heart valve problem    mild- moderate mitral valve leakage    High cholesterol    HYPERCHOLESTEROLEMIA 02/27/2010   Qualifier: Diagnosis of  By: Lennox Laity RN, Denise     Hypersomnia with sleep apnea, unspecified 11/03/2013   Hypertension    Lumbar radiculopathy 08/12/2018   MS (multiple sclerosis)  (HCC)    questionable   Neuromuscular disorder (HCC)    OSA on CPAP 03/25/2017   Osteopenia 04/2016   T score -1.9   Osteoporosis    Preop cardiovascular exam 03/23/2012   Retrognathia 11/03/2013   Right knee DJD 07/08/2012   Severe obesity (BMI >= 40) (HCC) 11/03/2013   Shortness of breath 06/03/2013   Sleep apnea    Vitamin D deficiency 2014   17   Past Surgical History: Past Surgical History:  Procedure Laterality Date   ABDOMINAL HYSTERECTOMY     ANKLE FUSION Left 04/25/2021   APPENDECTOMY     arthroscopic knee surgery     CESAREAN SECTION     x2   chloecystectomy     CHOLECYSTECTOMY     ESOPHAGEAL DILATION  04/09/2012   KNEE LIGAMENT RECONSTRUCTION     laser throat polyps     Left ankle reconstructed     NECK LESION BIOPSY     PATELLA FRACTURE SURGERY     PELVIC LAPAROSCOPY     LSO   REPLACEMENT TOTAL KNEE Left 2012   REPLACEMENT TOTAL KNEE Right 2014   TOE SURGERY     TONSILLECTOMY     TONSILLECTOMY  1979   TOTAL ABDOMINAL HYSTERECTOMY W/ BILATERAL SALPINGOOPHORECTOMY  2000   TOTAL KNEE ARTHROPLASTY Left 2012   TOTAL KNEE ARTHROPLASTY Right 07/08/2012   Procedure: TOTAL KNEE ARTHROPLASTY with revision components ;  Surgeon: Velna Ochs, MD;  Location: [facility] OR;  Service: Orthopedics;  Laterality: Right;  PATIENT HAS HX. OF FACTOR IV DISORFER   TUBAL LIGATION     Medication List:  Current Outpatient Medications  Medication Sig Dispense Refill   aspirin EC 81 MG tablet Take 81 mg by mouth daily.     CALCIUM MAGNESIUM ZINC PO Take 1 tablet by mouth daily in the afternoon.     Cholecalciferol (VITAMIN D3) 50 MCG (2000 UT) TABS      cloNIDine (CATAPRES) 0.1 MG tablet Take 1 tablet (0.1 mg total) by mouth 3 (three) times daily. 270 tablet 10   cyclobenzaprine (FLEXERIL) 5 MG tablet Take 1 tablet (5 mg total) by mouth 3 (three) times daily as needed for muscle spasms. 30 tablet 1   fluticasone (FLONASE) 50 MCG/ACT nasal spray Place 2 sprays into both nostrils  daily. (Patient taking differently: Place 2 sprays into both nostrils daily as needed for allergies.) 16 g 6   fluticasone-salmeterol (WIXELA INHUB) 250-50 MCG/ACT AEPB Inhale 1 puff into the lungs in the morning and at bedtime. 60 each 5   gabapentin (NEURONTIN) 300 MG capsule Take 2 capsules (600 mg total) by mouth at bedtime. 180 capsule 1   ketotifen (ZADITOR) 0.025 % ophthalmic solution Place 2 drops into both eyes daily as needed (for irritation). 5 mL 0   losartan (COZAAR) 50 MG tablet Take 1 tablet (50 mg total) by mouth 2 (two) times daily. 180 tablet 2   meloxicam (MOBIC) 15 MG tablet Take 1 tablet (15 mg total) by mouth daily. 90 tablet 1   metoprolol succinate (TOPROL-XL) 50 MG 24 hr tablet Take 1 tablet (50  mg total) by mouth daily. 90 tablet 3   montelukast (SINGULAIR) 10 MG tablet Take 1 tablet (10 mg total) by mouth at bedtime. 90 tablet 1   Multiple Vitamins-Minerals (ONE-A-DAY WOMENS 50+ ADVANTAGE PO) Take 1 tablet by mouth daily. Unknown strength     omeprazole (PRILOSEC) 40 MG capsule TAKE 1 CAPSULE EVERY DAY 90 capsule 3   ondansetron (ZOFRAN) 4 MG tablet Take 1 tablet (4 mg total) by mouth every 8 (eight) hours as needed for nausea or vomiting. 20 tablet 0   simvastatin (ZOCOR) 40 MG tablet Take 1 tablet (40 mg total) by mouth at bedtime. 30 tablet 0   spironolactone (ALDACTONE) 25 MG tablet TAKE 1/2 TABLET BY MOUTH EVERY DAY 45 tablet 3   No current facility-administered medications for this visit.   Allergies: Allergies  Allergen Reactions   Hydrochlorothiazide Nausea And Vomiting    Other reaction(s): Other (See Comments) Leg Cramps Leg Cramps  Other reaction(s): Other (See Comments) Leg Cramps   Morphine Other (See Comments), Rash and Nausea And Vomiting    Hallucinating  Other reaction(s): Other (See Comments) Hallucinating Hallucinating Hallucinating Hallucinating  Hallucinating  Hallucinating  Other reaction(s): Other (See Comments) Hallucinating  Hallucinating   Vancomycin Nausea And Vomiting    Ringing in ears   Erythromycin Base Hives   Erythromycin Base Hives   Vancomycin Hcl Other (See Comments)    Ringing in ears   Doxycycline Nausea Only and Rash   Social History: Social History   Socioeconomic History   Marital status: Widowed    Spouse name: [name]   Number of children: 2   Years of education: 12   Highest education level: 12th grade  Occupational History    Employer: [employer]  Tobacco Use   Smoking status: Never    Passive exposure: Never   Smokeless tobacco: Never  Vaping Use   Vaping status: Never Used  Substance and Sexual Activity   Alcohol use: Not Currently    Alcohol/week: 0.0 standard drinks of alcohol   Drug use: No   Sexual activity: Yes    Birth control/protection: Surgical    Comment: HYST-1st intercourse 58 yo-Fewer than 5 partners  Other Topics Concern   Not on file  Social History Narrative   [name]  and lives at home with her husband.   Patient has two children.   Patient works as a Product/process development scientist for [employer].   Patient drinks one cup of caffeine daily.   Patient is right-handed.   Patient has a high school education.   Social Drivers of Corporate investment banker Strain: Low Risk  (03/19/2023)   Overall Financial Resource Strain (CARDIA)    Difficulty of Paying Living Expenses: Not hard at all  Food Insecurity: No Food Insecurity (03/19/2023)   Hunger Vital Sign    Worried About Running Out of Food in the Last Year: Never true    Ran Out of Food in the Last Year: Never true  Transportation Needs: No Transportation Needs (03/19/2023)   PRAPARE - Administrator, Civil Service (Medical): No    Lack of Transportation (Non-Medical): No  Physical Activity: Insufficiently Active (03/19/2023)   Exercise Vital Sign    Days of Exercise per Week: 3 days    Minutes of Exercise per Session: 30 min  Stress: Stress Concern Present  (03/19/2023)   [employer] of Occupational Health - Occupational Stress Questionnaire    Feeling of Stress : To  some extent  Social Connections: Moderately Integrated (03/19/2023)   Social Connection and Isolation Panel [NHANES]    Frequency of Communication with Friends and Family: More than three times a week    Frequency of Social Gatherings with Friends and Family: More than three times a week    Attends Religious Services: More than 4 times per year    Active Member of [employer] or Organizations: Yes    Attends Banker Meetings: More than 4 times per year    Marital Status: Widowed   Lives in a house. Smoking: denies Occupation: disabled  Environmental HistorySurveyor, minerals in the house: no Engineer, civil (consulting) in the family room: no Carpet in the bedroom: no Heating: electric Cooling: central Pet: yes 1 dog x 10+ yrs  Family History: Family History  Problem Relation Age of Onset   Heart disease Father    Hypertension Father    Hyperlipidemia Father    Cancer Father    Other Father        Respiratory problems/Factor 5 disorder/1 bypass, 2 valve replacement (mitral and aortic)    Hypertension Mother    Diabetes Mother    Hyperlipidemia Mother    Hypertension Sister    Colon cancer Paternal Aunt    Breast cancer Paternal Aunt 50   Heart disease Maternal Grandfather    Lymphoma Maternal Grandfather    Heart failure Maternal Grandfather    Colon cancer Paternal Grandmother    Hyperlipidemia Brother    Hypertension Brother    Hypertension Brother    Colon cancer Paternal Aunt    Breast cancer Paternal Aunt 39   Stroke Paternal Grandfather    Problem                               Relation Asthma                                   no Eczema                                no Food allergy                          no Allergic rhino conjunctivitis     mother, brother  Review of Systems  Constitutional:  Negative for appetite change, chills, fever and  unexpected weight change.  HENT:  Positive for congestion, postnasal drip and rhinorrhea.   Eyes:  Negative for itching.  Respiratory:  Positive for cough, chest tightness and shortness of breath. Negative for wheezing.   Cardiovascular:  Negative for chest pain.  Gastrointestinal:  Negative for abdominal pain.  Genitourinary:  Negative for difficulty urinating.  Skin:  Negative for rash.  Neurological:  Positive for headaches.    Objective: BP 126/84 (BP Location: Right Arm, Patient Position: Sitting, Cuff Size: Large)   Pulse 76   Temp 97.8 F (36.6 C) (Temporal)   Resp 16   Ht 5' 1.81" (1.57 m)   Wt 262 lb 6.4 oz (119 kg)   SpO2 95%   BMI 48.29 kg/m  Body mass index is 48.29 kg/m. Physical Exam Vitals and nursing note reviewed.  Constitutional:      Appearance: Normal appearance. She is well-developed.  HENT:     Head: Normocephalic and  atraumatic.     Right Ear: Tympanic membrane and external ear normal.     Left Ear: Tympanic membrane and external ear normal.     Nose: Nose normal.     Mouth/Throat:     Mouth: Mucous membranes are moist.     Pharynx: Oropharynx is clear.  Eyes:     Conjunctiva/sclera: Conjunctivae normal.  Cardiovascular:     Rate and Rhythm: Normal rate and regular rhythm.     Heart sounds: Normal heart sounds. No murmur heard.    No friction rub. No gallop.  Pulmonary:     Effort: Pulmonary effort is normal.     Breath sounds: Normal breath sounds. No wheezing, rhonchi or rales.  Musculoskeletal:     Cervical back: Neck supple.  Skin:    General: Skin is warm.     Findings: No rash.  Neurological:     Mental Status: She is alert and oriented to person, place, and time.  Psychiatric:        Behavior: Behavior normal.   The plan was reviewed with the patient/family, and all questions/concerned were addressed.  It was my pleasure to see Renee Flores today and participate in her care. Please feel free to contact me with any questions or  concerns.  Sincerely,  Wyline Mood, DO Allergy & Immunology  [facility] office: [phone] [facility] office: [phone]

## 2023-05-28 ENCOUNTER — Encounter: Payer: Self-pay | Admitting: Allergy

## 2023-05-28 ENCOUNTER — Ambulatory Visit: Payer: PPO | Admitting: Allergy

## 2023-05-28 ENCOUNTER — Other Ambulatory Visit: Payer: Self-pay

## 2023-05-28 VITALS — BP 126/84 | HR 76 | Temp 97.8°F | Resp 16 | Ht 61.81 in | Wt 262.4 lb

## 2023-05-28 DIAGNOSIS — J4540 Moderate persistent asthma, uncomplicated: Secondary | ICD-10-CM

## 2023-05-28 DIAGNOSIS — K219 Gastro-esophageal reflux disease without esophagitis: Secondary | ICD-10-CM

## 2023-05-28 DIAGNOSIS — J328 Other chronic sinusitis: Secondary | ICD-10-CM

## 2023-05-28 NOTE — Patient Instructions (Addendum)
Rhinitis  Return for allergy skin testing. Will make additional recommendations based on results. Make sure you don't take any antihistamines for 3 days before the skin testing appointment. Don't put any lotion on the back and arms on the day of testing.  Plan on being here for 30-60 minutes.  Start doing sinus rinses as below.   Infections Keep track of infections and antibiotics use. If persistent will get bloodwork next to look at immune system.   Asthma Continue plan as per pulmonology. Daily controller medication(s): Continue Wixela 1 puff twice a day and rinse mouth after each use.  Continue Singulair (montelukast) 10mg  daily at night. May use albuterol rescue inhaler 2 puffs every 4 to 6 hours as needed for shortness of breath, chest tightness, coughing, and wheezing. May use albuterol rescue inhaler 2 puffs 5 to 15 minutes prior to strenuous physical activities. Monitor frequency of use. Breathing control goals:  Full participation in all desired activities (may need albuterol before activity) Albuterol use two times or less a week on average (not counting use with activity) Cough interfering with sleep two times or less a month Oral steroids no more than once a year No hospitalizations   GERD Continue lifestyle and dietary modifications. Continue omeprazole 40mg  once day - nothing to eat or drink for 20-30 minutes afterwards.    Buffered Isotonic Saline Irrigations:  Goal: When you irrigate with the isotonic saline (salt water) it washes mucous and other debris from your nose that could be contributing to your nasal symptoms.   Recipe: Obtain 1 quart jar that is clean Fill with clean (bottled, boiled or distilled) water Add 1-2 heaping teaspoons of salt without iodine If the solution with 2 teaspoons of salt is too strong, adjust the amount down until better tolerated Add 1 teaspoon of [employer] baking soda (pure bicarbonate) Mix ingredients together and store at  room temperature and discard after 1 week * Alternatively you can buy pre made salt packets for the [employer] bottle or there          are other over the counter brands available  Instructions: Warm  cup of the solution in the microwave if desired but be careful not to overheat as this will burn the inside of your nose Stand over a sink (or do it while you shower) and squirt the solution into one side of your nose aiming towards the back of your head Sometimes saying "coca cola" while irrigating can be helpful to prevent fluid from going down your throat  The solution will travel to the back of your nose and then come out the other side Perform this again on the other side Try to do this twice a day If you are using a nasal spray in addition to the irrigation, irrigate first and then use the topical nasal spray otherwise you will wash the nasal spray out of your nose

## 2023-06-04 ENCOUNTER — Encounter: Payer: Self-pay | Admitting: Allergy

## 2023-06-04 ENCOUNTER — Ambulatory Visit: Payer: PPO | Admitting: Allergy

## 2023-06-04 DIAGNOSIS — K219 Gastro-esophageal reflux disease without esophagitis: Secondary | ICD-10-CM

## 2023-06-04 DIAGNOSIS — J328 Other chronic sinusitis: Secondary | ICD-10-CM

## 2023-06-04 DIAGNOSIS — J3089 Other allergic rhinitis: Secondary | ICD-10-CM

## 2023-06-04 DIAGNOSIS — J4540 Moderate persistent asthma, uncomplicated: Secondary | ICD-10-CM

## 2023-06-04 DIAGNOSIS — J301 Allergic rhinitis due to pollen: Secondary | ICD-10-CM

## 2023-06-04 DIAGNOSIS — B999 Unspecified infectious disease: Secondary | ICD-10-CM | POA: Diagnosis not present

## 2023-06-04 MED ORDER — AZELASTINE HCL 0.1 % NA SOLN: 1.0000 | Freq: Two times a day (BID) | NASAL | 5 refills | Status: DC | PRN

## 2023-06-04 NOTE — Progress Notes (Signed)
 Skin testing note  RE: Renee Flores MRN: [id number] DOB: 07-Mar-1966 Date of Office Visit: 06/04/2023  Referring provider: Mahlon Comer BRAVO, MD Primary care provider: Mahlon Comer BRAVO, MD  Chief Complaint: skin testing  History of Present Illness: I had the pleasure of seeing Renee Flores for a skin testing [facility]  on 06/04/2023. She is a 58 y.o. female, who is being followed for chronic sinusitis, asthma, GERD. Her previous allergy  office visit was on 05/28/2023 with Dr. Luke. Today is a skin testing visit.   Discussed the use of AI scribe software for clinical note transcription with the patient, who gave verbal consent to proceed.    Sinus rinses have been effective in reducing her drainage and mucus symptoms.  She has a history of mold exposure in her previous home in  , where mold was found in the walls during repairs. However, she does not currently have a mold problem in her home.     Assessment and Plan: Renee Flores is a 58 y.o. female with: Other allergic rhinitis Seasonal allergic rhinitis due to pollen Allergic rhinitis due to mold Past history - Longstanding history of sinus congestion, drainage, and frequent sinus infections requiring antibiotics. Multiple triggers identified including strong scents and smoke. Currently on Flonase  and Singulair  with partial relief. Wears CPAP at night.  Today's skin testing positive to grass, dust mites and mold. Start environmental control measures as below. Use over the counter antihistamines such as Zyrtec (cetirizine), Claritin  (loratadine ), Allegra (fexofenadine), or Xyzal (levocetirizine) daily as needed. May take twice a day during allergy  flares. May switch antihistamines every few months. Use azelastine  nasal spray 1-2 sprays per nostril twice a day as needed for runny nose/drainage. Use Flonase  (fluticasone ) nasal spray 1-2 sprays per nostril once a day as needed for nasal  congestion.  Nasal saline spray (i.e., Simply Saline) or nasal saline lavage (i.e., NeilMed) is recommended as needed and prior to medicated nasal sprays. Consider allergy  injections for long term control if above medications do not help the symptoms - handout given.   Recurrent infections Other chronic sinusitis Keep track of infections and antibiotics use. Get bloodwork to look at immune system.   Moderate persistent asthma without complication Past history - Diagnosed by pulmonologist with symptoms of wheezing and shortness of breath. Currently managed with Wixela inhaler. Normal alpha-1 antitrypsin level. 2025 CXR normal. 2025 spirometry was unremarkable.  Continue plan as per pulmonology. Daily controller medication(s): Continue Wixela 1 puff twice a day and rinse mouth after each use.  Continue Singulair  (montelukast ) 10mg  daily at night. May use albuterol  rescue inhaler 2 puffs every 4 to 6 hours as needed for shortness of breath, chest tightness, coughing, and wheezing. May use albuterol  rescue inhaler 2 puffs 5 to 15 minutes prior to strenuous physical activities. Monitor frequency of use.  Gastroesophageal reflux disease, unspecified whether esophagitis present Continue lifestyle and dietary modifications. Continue omeprazole  40mg  once day - nothing to eat or drink for 20-30 minutes afterwards.   Return in about 4 months (around 10/02/2023).  Meds ordered this encounter  Medications   azelastine  (ASTELIN ) 0.1 % nasal spray    Sig: Place 1-2 sprays into both nostrils 2 (two) times daily as needed (nasal drainage). Use in each nostril as directed    Dispense:  30 mL    Refill:  5   Lab Orders         CBC with Differential/Platelet  Complement, total         IgG, IgA, IgM         Strep pneumoniae 23 Serotypes IgG         Diphtheria / Tetanus Antibody Panel         IgE      Diagnostics: Skin Testing: Environmental allergy  panel. Today's skin testing positive to  grass, dust mites and mold. Results discussed with patient/family.  Airborne Adult Perc - 06/04/23 0930     Time Antigen Placed 0930    Allergen Manufacturer Jestine    Location Back    Number of Test 55    Panel 1 Select    1. Control-Buffer 50% Glycerol Negative    2. Control-Histamine 3+    3. Bahia Negative    4. Bermuda Negative    5. Johnson 2+    6. [place]  Blue 2+    7. Meadow Fescue 2+    8. Perennial Rye 2+    9. Timothy 2+    10. Ragweed Mix Negative    11. Cocklebur Negative    12. Plantain,  English Negative    13. Baccharis Negative    14. Dog Fennel Negative    15. Russian Thistle Negative    16. Lamb's Quarters Negative    17. Sheep Sorrell Negative    18. Rough Pigweed Negative    19. Marsh Elder, Rough Negative    20. Mugwort, Common Negative    21. Box, Elder Negative    22. Cedar, red Negative    23. Sweet Gum Negative    24. Pecan Pollen Negative    25. Pine Mix Negative    26. Walnut, Black Pollen Negative    27. Red Mulberry Negative    28. Ash Mix Negative    29. Birch Mix Negative    30. Beech American Negative    31. Cottonwood, Eastern Negative    32. Hickory, White Negative    33. Maple Mix Negative    34. Oak, Eastern Mix Negative    35. Sycamore Eastern Negative    36. Alternaria Alternata Negative    37. Cladosporium Herbarum Negative    38. Aspergillus Mix Negative    39. Penicillium Mix Negative    40. Bipolaris Sorokiniana (Helminthosporium) Negative    41. Drechslera Spicifera (Curvularia) Negative    42. Mucor Plumbeus Negative    43. Fusarium Moniliforme Negative    44. Aureobasidium Pullulans (pullulara) Negative    45. Rhizopus Oryzae Negative    46. Botrytis Cinera Negative    47. Epicoccum Nigrum Negative    48. Phoma Betae Negative    49. Dust Mite Mix Negative    50. Cat Hair 10,000 BAU/ml Negative    51.  Dog Epithelia Negative    52. Mixed Feathers Negative    53. Horse Epithelia Negative    54. Cockroach,  German Negative    55. Tobacco Leaf Negative             Intradermal - 06/04/23 1019     Time Antigen Placed 1005    Allergen Manufacturer Greer    Location Arm    Number of Test 14    Control Negative    Bahia Negative    Bermuda Negative    Ragweed Mix Negative    Weed Mix Negative    Tree Mix Negative    Mold 1 Negative    Mold 2 Negative    Mold 3 Negative  Mold 4 2+    Mite Mix 2+    Cat Negative    Dog Negative    Cockroach Negative             Previous notes and tests were reviewed. The plan was reviewed with the patient/family, and all questions/concerned were addressed.  It was my pleasure to see Renee Flores today and participate in her care. Please feel free to contact me with any questions or concerns.  Sincerely,  Orlan Cramp, DO Allergy  & Immunology  [facility] [place]  [place] office: [phone] [facility] office: [phone]

## 2023-06-04 NOTE — Telephone Encounter (Signed)
 NFN

## 2023-06-04 NOTE — Patient Instructions (Addendum)
 Today's skin testing positive to grass, dust mites and mold.  Results given.  Environmental allergies Start environmental control measures as below. Use over the counter antihistamines such as Zyrtec (cetirizine), Claritin  (loratadine ), Allegra (fexofenadine), or Xyzal (levocetirizine) daily as needed. May take twice a day during allergy  flares. May switch antihistamines every few months. Use azelastine  nasal spray 1-2 sprays per nostril twice a day as needed for runny nose/drainage. Use Flonase  (fluticasone ) nasal spray 1-2 sprays per nostril once a day as needed for nasal congestion.  Nasal saline spray (i.e., Simply Saline) or nasal saline lavage (i.e., NeilMed) is recommended as needed and prior to medicated nasal sprays. Consider allergy  injections for long term control if above medications do not help the symptoms - handout given.   Infections Keep track of infections and antibiotics use. Get bloodwork to look at immune system.  We are ordering labs, so please allow 1-2 weeks for the results to come back. With the newly implemented Cures Act, the labs might be visible to you at the same time that they become visible to me. However, I will not address the results until all of the results are back, so please be patient.  In the meantime, continue recommendations in your patient instructions, including avoidance measures (if applicable), until you hear from me.  Asthma Continue plan as per pulmonology. Daily controller medication(s): Continue Wixela 1 puff twice a day and rinse mouth after each use.  Continue Singulair  (montelukast ) 10mg  daily at night. May use albuterol  rescue inhaler 2 puffs every 4 to 6 hours as needed for shortness of breath, chest tightness, coughing, and wheezing. May use albuterol  rescue inhaler 2 puffs 5 to 15 minutes prior to strenuous physical activities. Monitor frequency of use. Breathing control goals:  Full participation in all desired activities (may need  albuterol  before activity) Albuterol  use two times or less a week on average (not counting use with activity) Cough interfering with sleep two times or less a month Oral steroids no more than once a year No hospitalizations   GERD Continue lifestyle and dietary modifications. Continue omeprazole  40mg  once day - nothing to eat or drink for 20-30 minutes afterwards.   Follow up in 4 months or sooner if needed.   Reducing Pollen Exposure Pollen seasons: trees (spring), grass (summer) and ragweed/weeds (fall). Keep windows closed in your home and car to lower pollen exposure.  Install air conditioning in the bedroom and throughout the house if possible.  Avoid going out in dry windy days - especially early morning. Pollen counts are highest between 5 - 10 AM and on dry, hot and windy days.  Save outside activities for late afternoon or after a heavy rain, when pollen levels are lower.  Avoid mowing of grass if you have grass pollen allergy . Be aware that pollen can also be transported indoors on people and pets.  Dry your clothes in an automatic dryer rather than hanging them outside where they might collect pollen.  Rinse hair and eyes before bedtime.  Control of House Dust Mite Allergen Dust mite allergens are a common trigger of allergy  and asthma symptoms. While they can be found throughout the house, these microscopic creatures thrive in warm, humid environments such as bedding, upholstered furniture and carpeting. Because so much time is spent in the bedroom, it is essential to reduce mite levels there.  Encase pillows, mattresses, and box springs in special allergen-proof fabric covers or airtight, zippered plastic covers.  Bedding should be washed weekly in hot water (  130 F) and dried in a hot dryer. Allergen-proof covers are available for comforters and pillows that can't be regularly washed.  Wash the allergy -proof covers every few months. Minimize clutter in the bedroom. Keep  pets out of the bedroom.  Keep humidity less than 50% by using a dehumidifier or air conditioning. You can buy a humidity measuring device called a hygrometer to monitor this.  If possible, replace carpets with hardwood, linoleum, or washable area rugs. If that's not possible, vacuum frequently with a vacuum that has a HEPA filter. Remove all upholstered furniture and non-washable window drapes from the bedroom. Remove all non-washable stuffed toys from the bedroom.  Wash stuffed toys weekly.  Mold Control Mold and fungi can grow on a variety of surfaces provided certain temperature and moisture conditions exist.  Outdoor molds grow on plants, decaying vegetation and soil. The major outdoor mold, Alternaria and Cladosporium, are found in very high numbers during hot and dry conditions. Generally, a late summer - fall peak is seen for common outdoor fungal spores. Rain will temporarily lower outdoor mold spore count, but counts rise rapidly when the rainy period ends. The most important indoor molds are Aspergillus and Penicillium. Dark, humid and poorly ventilated basements are ideal sites for mold growth. The next most common sites of mold growth are the bathroom and the kitchen. Outdoor (Seasonal) Mold Control Use air conditioning and keep windows closed. Avoid exposure to decaying vegetation. Avoid leaf raking. Avoid grain handling. Consider wearing a face mask if working in moldy areas.  Indoor (Perennial) Mold Control  Maintain humidity below 50%. Get rid of mold growth on hard surfaces with water, detergent and, if necessary, 5% bleach (do not mix with other cleaners). Then dry the area completely. If mold covers an area more than 10 square feet, consider hiring an indoor environmental professional. For clothing, washing with soap and water is best. If moldy items cannot be cleaned and dried, throw them away. Remove sources e.g. contaminated carpets. Repair and seal leaking roofs or pipes.  Using dehumidifiers in damp basements may be helpful, but empty the water and clean units regularly to prevent mildew from forming. All rooms, especially basements, bathrooms and kitchens, require ventilation and cleaning to deter mold and mildew growth. Avoid carpeting on concrete or damp floors, and storing items in damp areas.   Buffered Isotonic Saline Irrigations:  Goal: When you irrigate with the isotonic saline (salt water) it washes mucous and other debris from your nose that could be contributing to your nasal symptoms.   Recipe: Obtain 1 quart jar that is clean Fill with clean (bottled, boiled or distilled) water Add 1-2 heaping teaspoons of salt without iodine If the solution with 2 teaspoons of salt is too strong, adjust the amount down until better tolerated Add 1 teaspoon of Arm [employer] Hammer baking soda (pure bicarbonate) Mix ingredients together and store at room temperature and discard after 1 week * Alternatively you can buy pre made salt packets for the NeilMed bottle or there          are other over the counter brands available  Instructions: Warm  cup of the solution in the microwave if desired but be careful not to overheat as this will burn the inside of your nose Stand over a sink (or do it while you shower) and squirt the solution into one side of your nose aiming towards the back of your head Sometimes saying "coca cola" while irrigating can be helpful to prevent fluid  from going down your throat  The solution will travel to the back of your nose and then come out the other side Perform this again on the other side Try to do this twice a day If you are using a nasal spray in addition to the irrigation, irrigate first and then use the topical nasal spray otherwise you will wash the nasal spray out of your nose

## 2023-06-09 ENCOUNTER — Other Ambulatory Visit: Payer: Self-pay

## 2023-06-09 DIAGNOSIS — K219 Gastro-esophageal reflux disease without esophagitis: Secondary | ICD-10-CM

## 2023-06-09 MED ORDER — OMEPRAZOLE 40 MG PO CPDR: 40.0000 mg | DELAYED_RELEASE_CAPSULE | Freq: Every day | ORAL | 3 refills | Status: DC

## 2023-06-11 ENCOUNTER — Other Ambulatory Visit: Payer: Self-pay

## 2023-06-11 DIAGNOSIS — M5416 Radiculopathy, lumbar region: Secondary | ICD-10-CM

## 2023-06-11 MED ORDER — MELOXICAM 15 MG PO TABS
15.0000 mg | ORAL_TABLET | Freq: Every day | ORAL | 1 refills | Status: DC
Start: 2023-06-11 — End: 2023-12-23

## 2023-06-24 DIAGNOSIS — B999 Unspecified infectious disease: Secondary | ICD-10-CM | POA: Diagnosis not present

## 2023-06-24 DIAGNOSIS — J4540 Moderate persistent asthma, uncomplicated: Secondary | ICD-10-CM | POA: Diagnosis not present

## 2023-06-30 ENCOUNTER — Ambulatory Visit (INDEPENDENT_AMBULATORY_CARE_PROVIDER_SITE_OTHER): Admitting: Family Medicine

## 2023-06-30 ENCOUNTER — Ambulatory Visit: Payer: Self-pay | Admitting: Family Medicine

## 2023-06-30 VITALS — BP 126/80 | HR 106 | Temp 98.4°F | Wt 271.2 lb

## 2023-06-30 DIAGNOSIS — J4530 Mild persistent asthma, uncomplicated: Secondary | ICD-10-CM

## 2023-06-30 DIAGNOSIS — R059 Cough, unspecified: Secondary | ICD-10-CM

## 2023-06-30 LAB — POCT RAPID STREP A (OFFICE): Rapid Strep A Screen: POSITIVE — AB

## 2023-06-30 LAB — POC COVID19 BINAXNOW: SARS Coronavirus 2 Ag: NEGATIVE

## 2023-06-30 LAB — POCT INFLUENZA A/B
Influenza A, POC: NEGATIVE
Influenza B, POC: NEGATIVE

## 2023-06-30 MED ORDER — AMOXICILLIN 500 MG PO CAPS: 500.0000 mg | ORAL_CAPSULE | Freq: Three times a day (TID) | ORAL | 0 refills | Status: AC

## 2023-06-30 NOTE — Telephone Encounter (Signed)
  Chief Complaint: sinus pain/congestion Symptoms: right earache, sore throat, productive cough, fever, sinus pain and congestion, wheezing Frequency: x1 week, worsened since yesterday Pertinent Negatives: Patient denies chest pain Disposition: [] ED /[] Urgent Care (no appt availability in office) / [x] Appointment(In office/virtual)/ []  [place] [facility]/ [] Home Care/ [] Refused Recommended Disposition /[] [facility]/ []  Follow-up with PCP Additional Notes: Patient states she has been treating at home with OTC allergy/cold/fever medication, flonase, placing a cold rag on her head and saline nasal washes. Patient states her mother and father are also sick and he tested positive for RSV yesterday. Patient speaking in full sentences, no labored breathing or wheezing noted. Advised patient to wear a mask for acute OV. Offered appointment tomorrow with PCP, patient states she prefers to be seen today. Patient scheduled with [facility].  Copied from CRM [id number]. Topic: Clinical - Red Word Triage >> Jun 30, 2023  7:34 AM Alphonzo Lemmings O wrote: [facility] that prompted transfer to Nurse Triage: running fever 102 real bad sinus infection my right ear is killing me throat  sore Reason for Disposition  [1] Sinus pain (not just congestion) AND [2] fever  Answer Assessment - Initial Assessment Questions 1. LOCATION: "Where does it hurt?"      Pain behind both eyes, worse on right side and in right ear.  2. ONSET: "When did the sinus pain start?"  (e.g., hours, days)      X1 week and worse since yesterday and through the night.  3. SEVERITY: "How bad is the pain?"   (Scale 1-10; mild, moderate or severe)   - MILD (1-3): doesn't interfere with normal activities    - MODERATE (4-7): interferes with normal activities (e.g., work or school) or awakens from sleep   - SEVERE (8-10): excruciating pain and patient unable to do any normal activities        7/10, patient states she took an  allergy/fever/headache/cough OTC combo medication this morning.  4. RECURRENT SYMPTOM: "Have you ever had sinus problems before?" If Yes, ask: "When was the last time?" and "What happened that time?"      Yes, she states she was getting it every month and then was sent to allergist by pulmonologist. She states she was tested about 2 weeks ago and told she is allergic to mold and certain grass. She was tested for her immune system to see if it is weak, but has not heard back on the test.  5. NASAL CONGESTION: "Is the nose blocked?" If Yes, ask: "Can you open it or must you breathe through your mouth?"     Yes, she states the left nostril is not as bad but the right nostril is blocked up. States she has used a saline nasal wash yesterday.  6. NASAL DISCHARGE: "Do you have discharge from your nose?" If so ask, "What color?"     Yes. Green.  7. FEVER: "Do you have a fever?" If Yes, ask: "What is it, how was it measured, and when did it start?"      102 this morning, took with temporal thermometer.  8. OTHER SYMPTOMS: "Do you have any other symptoms?" (e.g., sore throat, cough, earache, difficulty breathing)     Sore throat, productive cough, right earache, wheezing  Protocols used: Sinus Pain or Congestion-A-AH

## 2023-06-30 NOTE — Telephone Encounter (Signed)
 Patient has been scheduled with Dr.Parker at a different location for today 06/30/2023.

## 2023-06-30 NOTE — Patient Instructions (Signed)
 It was very nice to see you today!  Your strep test is positive.  Start the amoxicillin.  Make sure that you are getting plenty of fluids and staying hydrated.  Let us know if not improving.  Return if symptoms worsen or fail to improve.   Take care, Dr Jimmey Ralph  PLEASE NOTE:  If you had any lab tests, please let us know if you have not heard back within a few days. You may see your results on mychart before we have a chance to review them but we will give you a call once they are reviewed by [place].   If we ordered any referrals today, please let us know if you have not heard from their office within the next week.   If you had any urgent prescriptions sent in today, please check with the pharmacy within an hour of our visit to make sure the prescription was transmitted appropriately.   Please try these tips to maintain a healthy lifestyle:  Eat at least 3 REAL meals and 1-2 snacks per day.  Aim for no more than 5 hours between eating.  If you eat breakfast, please do so within one hour of getting up.   Each meal should contain half fruits/vegetables, one quarter protein, and one quarter carbs (no bigger than a computer mouse)  Cut down on sweet beverages. This includes juice, soda, and sweet tea.   Drink at least 1 glass of water with each meal and aim for at least 8 glasses per day  Exercise at least 150 minutes every week.

## 2023-06-30 NOTE — Progress Notes (Signed)
   Renee Flores is a 58 y.o. female who presents today for an office visit.  Assessment/Plan:  New/Acute Problems: Strep Pharyngitis  Rapid strep positive.  Will start amoxicillin for 10 days.  Encouraged hydration.  She can continue over-the-counter meds as needed as well.  We discussed reasons to return to care.  Follow-up as needed.  Chronic Problems Addressed Today: Asthma Mild flare related to her above URI.  We did discuss starting prednisone however she would like to hold off on this for now.  She will continue her home inhalers.  We discussed reasons to return to care and seek emergent care.   Subjective:  HPI:  Patient here with 1 week of cough, congestion, fever, and sore throat. Her father was diagnosed with RSV yesterday.  Symptoms are progressively worsening.  Over-the-counter meds are not working.  She does have a history of asthma.  She has had a little wheezing.  No shortness of breath.  She has been consistent with her home inhalers.  She had fever this morning 102.       Objective:  Physical Exam: BP 126/80   Pulse (!) 106   Temp 98.4 F (36.9 C) (Temporal)   Wt 271 lb 3.2 oz (123 kg)   SpO2 93%   BMI 49.91 kg/m   Gen: No acute distress, resting comfortably HEENT: TMs erythematous and bulging bilaterally.  OP erythematous.  Nasal mucosa erythematous and boggy bilaterally. CV: Regular rate and rhythm with no murmurs appreciated Pulm: Normal work of breathing, clear to auscultation bilaterally with no crackles, wheezes, or rhonchi Neuro: Grossly normal, moves all extremities Psych: Normal affect and thought content      Krystalyn Kubota M. Jimmey Ralph, MD 06/30/2023 11:53 AM

## 2023-07-02 ENCOUNTER — Encounter: Payer: Self-pay | Admitting: Allergy

## 2023-07-02 LAB — STREP PNEUMONIAE 23 SEROTYPES IGG
Pneumo Ab Type 1*: 0.2 ug/mL — ABNORMAL LOW (ref >1.3)
Pneumo Ab Type 12 (12F)*: <0.1 ug/mL — ABNORMAL LOW (ref >1.3)
Pneumo Ab Type 14*: 0.7 ug/mL — ABNORMAL LOW (ref >1.3)
Pneumo Ab Type 17 (17F)*: <0.1 ug/mL — ABNORMAL LOW (ref >1.3)
Pneumo Ab Type 19 (19F)*: 1.8 ug/mL (ref >1.3)
Pneumo Ab Type 2*: 0.8 ug/mL — ABNORMAL LOW (ref >1.3)
Pneumo Ab Type 20*: 1.6 ug/mL (ref >1.3)
Pneumo Ab Type 22 (22F)*: 1.4 ug/mL (ref >1.3)
Pneumo Ab Type 23 (23F)*: <0.1 ug/mL — ABNORMAL LOW (ref >1.3)
Pneumo Ab Type 26 (6B)*: <0.1 ug/mL — ABNORMAL LOW (ref >1.3)
Pneumo Ab Type 3*: 0.1 ug/mL — ABNORMAL LOW (ref >1.3)
Pneumo Ab Type 34 (10A)*: 0.2 ug/mL — ABNORMAL LOW (ref >1.3)
Pneumo Ab Type 4*: 0.6 ug/mL — ABNORMAL LOW (ref >1.3)
Pneumo Ab Type 43 (11A)*: 0.3 ug/mL — ABNORMAL LOW (ref >1.3)
Pneumo Ab Type 5*: <0.1 ug/mL — ABNORMAL LOW (ref >1.3)
Pneumo Ab Type 51 (7F)*: 0.1 ug/mL — ABNORMAL LOW (ref >1.3)
Pneumo Ab Type 54 (15B)*: 2.6 ug/mL (ref >1.3)
Pneumo Ab Type 56 (18C)*: 0.1 ug/mL — ABNORMAL LOW (ref >1.3)
Pneumo Ab Type 57 (19A)*: 1.6 ug/mL (ref >1.3)
Pneumo Ab Type 68 (9V)*: 0.5 ug/mL — ABNORMAL LOW (ref >1.3)
Pneumo Ab Type 70 (33F)*: 1.4 ug/mL (ref >1.3)
Pneumo Ab Type 8*: 4.8 ug/mL (ref >1.3)
Pneumo Ab Type 9 (9N)*: 1.2 ug/mL — ABNORMAL LOW (ref >1.3)

## 2023-07-02 LAB — COMPLEMENT, TOTAL: Compl, Total (CH50): >60 U/mL (ref >41)

## 2023-07-02 LAB — CBC WITH DIFFERENTIAL/PLATELET
Basophils Absolute: 0.1 10*3/uL (ref 0.0–0.2)
Basos: 1 %
EOS (ABSOLUTE): 0.5 10*3/uL — ABNORMAL HIGH (ref 0.0–0.4)
Eos: 7 %
Hematocrit: 38 % (ref 34.0–46.6)
Hemoglobin: 12.5 g/dL (ref 11.1–15.9)
Immature Grans (Abs): 0 10*3/uL (ref 0.0–0.1)
Immature Granulocytes: 0 %
Lymphocytes Absolute: 2.9 10*3/uL (ref 0.7–3.1)
Lymphs: 41 %
MCH: 29.6 pg (ref 26.6–33.0)
MCHC: 32.9 g/dL (ref 31.5–35.7)
MCV: 90 fL (ref 79–97)
Monocytes Absolute: 0.5 10*3/uL (ref 0.1–0.9)
Monocytes: 7 %
Neutrophils Absolute: 3 10*3/uL (ref 1.4–7.0)
Neutrophils: 44 %
Platelets: 261 10*3/uL (ref 150–450)
RBC: 4.23 x10E6/uL (ref 3.77–5.28)
RDW: 13 % (ref 11.7–15.4)
WBC: 7 10*3/uL (ref 3.4–10.8)

## 2023-07-02 LAB — DIPHTHERIA / TETANUS ANTIBODY PANEL
Diphtheria Ab: 0.55 [IU]/mL (ref <0.10)
Tetanus Ab, IgG: 1.14 [IU]/mL (ref <0.10)

## 2023-07-02 LAB — IGE: IgE (Immunoglobulin E), Serum: 175 [IU]/mL (ref 6–495)

## 2023-07-02 LAB — IGG, IGA, IGM
IgA/Immunoglobulin A, Serum: 140 mg/dL (ref 87–352)
IgG (Immunoglobin G), Serum: 1084 mg/dL (ref 586–1602)
IgM (Immunoglobulin M), Srm: 198 mg/dL (ref 26–217)

## 2023-07-03 ENCOUNTER — Ambulatory Visit: Admitting: Family Medicine

## 2023-07-03 ENCOUNTER — Encounter: Payer: Self-pay | Admitting: Family Medicine

## 2023-07-03 VITALS — BP 118/82 | HR 84 | Temp 97.8°F | Wt 268.0 lb

## 2023-07-03 DIAGNOSIS — R0602 Shortness of breath: Secondary | ICD-10-CM | POA: Diagnosis not present

## 2023-07-03 DIAGNOSIS — R062 Wheezing: Secondary | ICD-10-CM | POA: Diagnosis not present

## 2023-07-03 DIAGNOSIS — R059 Cough, unspecified: Secondary | ICD-10-CM

## 2023-07-03 LAB — POCT RESPIRATORY SYNCYTIAL VIRUS: RSV Rapid Ag: NEGATIVE

## 2023-07-03 MED ORDER — PROMETHAZINE-DM 6.25-15 MG/5ML PO SYRP: 5.0000 mL | ORAL_SOLUTION | Freq: Four times a day (QID) | ORAL | 0 refills | Status: DC | PRN

## 2023-07-03 MED ORDER — PREDNISONE 10 MG PO TABS: ORAL_TABLET | ORAL | 0 refills | Status: DC

## 2023-07-03 MED ORDER — ALBUTEROL SULFATE HFA 108 (90 BASE) MCG/ACT IN AERS: 2.0000 | INHALATION_SPRAY | Freq: Four times a day (QID) | RESPIRATORY_TRACT | 0 refills | Status: DC | PRN

## 2023-07-03 NOTE — Patient Instructions (Signed)
 Follow up as needed or as scheduled FINISH the Amoxicillin USE the cough syrup as needed USE the Albuterol inhaler as needed START the Prednisone as directed- 3 pills at the same time x3 days, then 2 pills at the same time x3 days, then 1 pill daily.  Take w/ food  Drink lots of fluids REST! Call with any questions or concerns Hang in there!

## 2023-07-03 NOTE — Progress Notes (Signed)
   Subjective:    Patient ID: Renee Flores, female    DOB: 1965/07/31, 58 y.o.   MRN: [id number]  HPI Cough- sxs started 3 weeks ago.  Cough is productive of green sputum.  Was told she had strep on Tuesday.  Now on Amoxicillin 500mg  TID x10 days.  At that time was negative for flu and COVID.  + RSV exposure.  Continues to have to the point of gagging.  Pt reports she can hear herself wheezing- particularly when lying down.  Pt has both albuterol and Wixela.     Review of Systems For ROS see HPI     Objective:   Physical Exam Vitals reviewed.  Constitutional:      General: She is not in acute distress.    Appearance: She is well-developed. She is obese. She is not ill-appearing.  HENT:     Head: Normocephalic and atraumatic.  Cardiovascular:     Rate and Rhythm: Normal rate and regular rhythm.  Pulmonary:     Breath sounds: Decreased breath sounds and wheezing (decreased airmovement throughout w/ scattered expiratory wheezes) present.     Comments: + hacking cough Musculoskeletal:     Cervical back: Normal range of motion and neck supple.  Lymphadenopathy:     Cervical: No cervical adenopathy.  Skin:    General: Skin is warm and dry.  Neurological:     General: No focal deficit present.     Mental Status: She is alert.  Psychiatric:        Mood and Affect: Mood normal.        Behavior: Behavior normal.           Assessment & Plan:  Cough/SOB- new to provider, ongoing for pt x3 weeks.  On Tuesday was started on Amox for strep throat but cough and wheezing continue.  Given her decreased air movement, wheezes, and persistent cough will start Prednisone taper.  Refilled Albuterol for her to use PRN.  Cough syrup prn.  Reviewed supportive care and red flags that should prompt return.  Pt expressed understanding and is in agreement w/ plan.

## 2023-07-06 ENCOUNTER — Ambulatory Visit: Payer: Self-pay | Admitting: Adult Health

## 2023-07-06 NOTE — Telephone Encounter (Signed)
 Please set up office visit for further evaluation, she was seen in Jan with Asthma flare as well.  May need to expand maintenance regimen, and consider biologic if not improving  Please contact office for sooner follow up if symptoms do not improve or worsen or seek emergency care

## 2023-07-06 NOTE — Telephone Encounter (Signed)
 The patient reported a productive cough with green sputum and intermittent wheezing that has been ongoing for about 2 weeks.  She was seen in her pcp's office twice and prescribed amoxicillin 500 mg three times daily for 10 days for strep on Tuesday.  Friday, she returned to the office and was prescribed a prednisone taper. Her parents have been sick with RSV and she is concerned that she has it to.  Her test was negative but she had already started the antibiotic prior to testing.  She was negative for the flu and covid. Her cough has continued which causes her to be more short of breath.  She reported shortness of breath with light exertion. She has been using her rescue inhaler frequently but stated that it is not very helpful.  She has not used her nebulizer.  The patient stated that her daily inhaler was switched to [facility] due to cost, and she is concerned that it is not "strong enough." She reported pain in her back and ribs due to coughing so much and tightness in her lungs.  There were no same day appointment available.    An allergist said that her immune system is normal but her pneumococcal titers came back low and recommended pneumonia vaccine.  She is unable to get the vaccine due to being sick.  Routed to pulmonologist for treatment options.  Spoke to [employer] in scheduling and confirmed that no same day appointments are available. Reason for Disposition  Wheezing is present  Answer Assessment - Initial Assessment Questions 1. ONSET: "When did the cough begin?"      2 weeks  2. SEVERITY: "How bad is the cough today?"      Severe  3. SPUTUM: "Describe the color of your sputum" (none, dry cough; clear, white, yellow, green)     green 4. HEMOPTYSIS: "Are you coughing up any blood?" If so ask: "How much?" (flecks, streaks, tablespoons, etc.)     None  5. DIFFICULTY BREATHING: "Are you having difficulty breathing?" If Yes, ask: "How bad is it?" (e.g., mild, moderate, severe)    - MILD: No SOB at  rest, mild SOB with walking, speaks normally in sentences, can lie down, no retractions, pulse < 100.    - MODERATE: SOB at rest, SOB with minimal exertion and prefers to sit, cannot lie down flat, speaks in phrases, mild retractions, audible wheezing, pulse 100-120.    - SEVERE: Very SOB at rest, speaks in single words, struggling to breathe, sitting hunched forward, retractions, pulse > 120      Worse with cough Short of breath with exertion - light exertion   6. FEVER: "Do you have a fever?" If Yes, ask: "What is your temperature, how was it measured, and when did it start?"     Last Tuesday 102 not since Wednesday   8. LUNG HISTORY: "Do you have any history of lung disease?"  (e.g., pulmonary embolus, asthma, emphysema)     Asthma   10. OTHER SYMPTOMS: "Do you have any other symptoms?" (e.g., runny nose, wheezing, chest pain)       Wheezing intermittent - pcp said "lungs sound tight"  Protocols used: Cough - Acute Productive-A-AH

## 2023-07-06 NOTE — Telephone Encounter (Signed)
 Tammy,  Please advise.  Thank you.

## 2023-07-07 NOTE — Telephone Encounter (Addendum)
 Spoke with patient regarding prior message . Patient has been coughing and having asthma flare up.  Patient did call yesterday regarding this message see message below   The patient reported a productive cough with green sputum and intermittent wheezing that has been ongoing for about 2 weeks.  She was seen in her pcp's office twice and prescribed amoxicillin 500 mg three times daily for 10 days for strep on Tuesday.  Friday, she returned to the office and was prescribed a prednisone taper. Her parents have been sick with RSV and she is concerned that she has it to.  Her test was negative but she had already started the antibiotic prior to testing.  She was negative for the flu and covid. Her cough has continued which causes her to be more short of breath.  She reported shortness of breath with light exertion. She has been using her rescue inhaler frequently but stated that it is not very helpful.  She has not used her nebulizer.  The patient stated that her daily inhaler was switched to [facility] due to cost, and she is concerned that it is not "strong enough." She reported pain in her back and ribs due to coughing so much and tightness in her lungs.  There were no same day appointment available.    An allergist said that her immune system is normal but her pneumococcal titers came back low and recommended pneumonia vaccine.  She is unable to get the vaccine due to being sick.  Routed to pulmonologist for treatment options.  Spoke to [employer] in scheduling and confirmed that no same day appointments are available.   Dr.Desai can you please advise  Thank you

## 2023-07-07 NOTE — Telephone Encounter (Signed)
 Spoke with patient regarding prior message from Dr.Desai  I definitely recommend using her nebulizer machine for chest tightness wheezing and shortness of breath 4 times a day until improved. Continue wixela. She's already had a course of abx so I don't think additional abx would be helpful. There is no treatment for RSV beyond the supportive care from steroids, nebulizer machine. Acetaminophen and ibuprofen will be helpful for the pain from the coughing. Agree with tammy's recommendation to get her in for a sooner appointment with Tammy or whoever can see her.    Made a office visit with Tammy on 08/17/23 and advised patient she could go onto mychart to look daily to see if any sooner office visit's become available.  Patient's voice was understanding.Nothing else further needed at this time.

## 2023-07-07 NOTE — Telephone Encounter (Signed)
 I definitely recommend using her nebulizer machine for chest tightness wheezing and shortness of breath 4 times a day until improved. Continue wixela. She's already had a course of abx so I don't think additional abx would be helpful. There is no treatment for RSV beyond the supportive care from steroids, nebulizer machine. Acetaminophen and ibuprofen will be helpful for the pain from the coughing. Agree with tammy's recommendation to get her in for a sooner appointment with Tammy or whoever can see her.

## 2023-07-21 ENCOUNTER — Other Ambulatory Visit: Payer: Self-pay

## 2023-07-21 DIAGNOSIS — T7840XA Allergy, unspecified, initial encounter: Secondary | ICD-10-CM

## 2023-07-21 MED ORDER — MONTELUKAST SODIUM 10 MG PO TABS: 10.0000 mg | ORAL_TABLET | Freq: Every day | ORAL | 1 refills | Status: DC

## 2023-08-03 ENCOUNTER — Ambulatory Visit (HOSPITAL_BASED_OUTPATIENT_CLINIC_OR_DEPARTMENT_OTHER): Payer: PPO | Admitting: Cardiology

## 2023-08-04 ENCOUNTER — Encounter: Payer: Self-pay | Admitting: Family Medicine

## 2023-08-04 DIAGNOSIS — E1169 Type 2 diabetes mellitus with other specified complication: Secondary | ICD-10-CM

## 2023-08-05 MED ORDER — SIMVASTATIN 40 MG PO TABS: 40.0000 mg | ORAL_TABLET | Freq: Every day | ORAL | 0 refills | Status: DC

## 2023-08-17 ENCOUNTER — Other Ambulatory Visit: Payer: Self-pay

## 2023-08-17 ENCOUNTER — Ambulatory Visit: Admitting: Adult Health

## 2023-08-17 ENCOUNTER — Encounter: Payer: Self-pay | Admitting: Adult Health

## 2023-08-17 VITALS — BP 110/80 | HR 94 | Temp 98.2°F | Ht 63.0 in | Wt 275.2 lb

## 2023-08-17 DIAGNOSIS — J309 Allergic rhinitis, unspecified: Secondary | ICD-10-CM

## 2023-08-17 DIAGNOSIS — G4733 Obstructive sleep apnea (adult) (pediatric): Secondary | ICD-10-CM | POA: Diagnosis not present

## 2023-08-17 DIAGNOSIS — J4540 Moderate persistent asthma, uncomplicated: Secondary | ICD-10-CM

## 2023-08-17 MED ORDER — AIRSUPRA 90-80 MCG/ACT IN AERO: 2.0000 | INHALATION_SPRAY | Freq: Four times a day (QID) | RESPIRATORY_TRACT | 3 refills | Status: DC | PRN

## 2023-08-17 MED ORDER — GABAPENTIN 300 MG PO CAPS: 600.0000 mg | ORAL_CAPSULE | Freq: Every day | ORAL | 1 refills | Status: DC

## 2023-08-17 NOTE — Progress Notes (Unsigned)
 @Patient  ID: Renee Flores, female    DOB: April 14, 1966, 58 y.o.   MRN: [id number]  Chief Complaint  Patient presents with   Acute Visit    Referring provider: Jess Morita, MD  HPI: 58 year old female followed for mild persistent asthma and allergic rhinitis Medical history significant for obstructive sleep apnea on nocturnal CPAP followed by neurology  TEST/EVENTS :  RAST panel June 04, 2022 essentially negative Absolute eosinophil count March 17, 2022 900   Asthma workup:  Previous exposure to mold in home and possible asbestos.(Shingles)    PFTs done on June 24, 2022 showed no significant airflow obstruction or restriction. FEV1 at 91%, ratio 90, FVC 79%, positive bronchodilator response, DLCO 114%    05/2022 HRCT chest was negative for ILD. Did show some mild tracheobronchomalacia on expiratory phase. Right basilar scarring, air trapping/small airways disease  08/17/2023 Follow up ; Asthma and Allergic Rhinitis  Patient returns for a 54-month follow-up.  Patient says over the last few months she has had recurrent asthma exacerbation.  Last visit she was treated for an acute asthmatic bronchitis with antibiotics.  Patient says she got better briefly.  Last month she developed significant cough wheezing and sore throat.  Seen by primary care on last month was diagnosed with strep throat.  She was treated with antibiotics and a steroid taper.  Patient says she is feeling some better.  She has been seen by asthma and allergy .  Skin testing was positive for grass, dust mite and mold.  She has been recommended on an aggressive maintenance regimen with Wixela, Singulair , Astelin , Allegra and Flonase .  Patient denies any fever, discolored mucus, chest pain.  Chest x-ray last visit was clear.  Allergy  notes reviewed.Immunoglobulin panel was normal.  IgE 175.  Eosinophil absolute count 500.  Pneumococcal titers were low.  She has been recommended to get Pneumovax 23 vaccine.      Allergies  Allergen Reactions   Hydrochlorothiazide  Nausea And Vomiting    Other reaction(s): Other (See Comments) Leg Cramps Leg Cramps  Other reaction(s): Other (See Comments) Leg Cramps   Morphine Other (See Comments), Rash and Nausea And Vomiting    Hallucinating  Other reaction(s): Other (See Comments) Hallucinating Hallucinating Hallucinating Hallucinating  Hallucinating  Hallucinating  Other reaction(s): Other (See Comments) Hallucinating Hallucinating   Vancomycin  Nausea And Vomiting    Ringing in ears   Erythromycin Base Hives   Erythromycin Base Hives   Vancomycin  Hcl Other (See Comments)    Ringing in ears   Doxycycline  Nausea Only and Rash    Immunization History  Administered Date(s) Administered   Influenza, Seasonal, Injecte, Preservative Fre 03/20/2023   Moderna Sars-Covid-2 Vaccination 08/05/2019, 09/01/2019, 04/05/2020   Tdap 04/28/2006, 09/12/2021    Past Medical History:  Diagnosis Date   Allergy     Arthritis    Asthma    Bulging lumbar disc    L4-5   Complication of anesthesia    anxiety attack after waking up from anesthesia   Factor 5 Leiden mutation, heterozygous (HCC)    Factor V deficiency, congenital (HCC) 03/23/2012   GERD (gastroesophageal reflux disease)    Heart murmur    Heart palpitations    Heart valve problem    mild- moderate mitral valve leakage    High cholesterol    HYPERCHOLESTEROLEMIA 02/27/2010   Qualifier: Diagnosis of  By: Leroy Ranks RN, Denise     Hypersomnia with sleep apnea, unspecified 11/03/2013   Hypertension    Lumbar radiculopathy 08/12/2018  MS (multiple sclerosis) (HCC)    questionable   Neuromuscular disorder (HCC)    OSA on CPAP 03/25/2017   Osteopenia 04/2016   T score -1.9   Osteoporosis    Preop cardiovascular exam 03/23/2012   Retrognathia 11/03/2013   Right knee DJD 07/08/2012   Severe obesity (BMI >= 40) (HCC) 11/03/2013   Shortness of breath 06/03/2013   Sleep apnea    Vitamin D   deficiency 2014   17    Tobacco History: Social History   Tobacco Use  Smoking Status Never   Passive exposure: Never  Smokeless Tobacco Never   Counseling given: Not Answered   Outpatient Medications Prior to Visit  Medication Sig Dispense Refill   albuterol  (VENTOLIN  HFA) 108 (90 Base) MCG/ACT inhaler Inhale 2 puffs into the lungs every 6 (six) hours as needed for wheezing or shortness of breath. 8 g 0   aspirin  EC 81 MG tablet Take 81 mg by mouth daily.     azelastine  (ASTELIN ) 0.1 % nasal spray Place 1-2 sprays into both nostrils 2 (two) times daily as needed (nasal drainage). Use in each nostril as directed 30 mL 5   CALCIUM  MAGNESIUM ZINC PO Take 1 tablet by mouth daily in the afternoon.     Cholecalciferol (VITAMIN D3) 50 MCG (2000 UT) TABS      cloNIDine  (CATAPRES ) 0.1 MG tablet Take 1 tablet (0.1 mg total) by mouth 3 (three) times daily. 270 tablet 10   cyclobenzaprine  (FLEXERIL ) 5 MG tablet Take 1 tablet (5 mg total) by mouth 3 (three) times daily as needed for muscle spasms. 30 tablet 1   fluticasone  (FLONASE ) 50 MCG/ACT nasal spray Place 2 sprays into both nostrils daily. (Patient taking differently: Place 2 sprays into both nostrils daily as needed for allergies.) 16 g 6   fluticasone -salmeterol (WIXELA INHUB) 250-50 MCG/ACT AEPB Inhale 1 puff into the lungs in the morning and at bedtime. 60 each 5   gabapentin  (NEURONTIN ) 300 MG capsule Take 2 capsules (600 mg total) by mouth at bedtime. 180 capsule 1   ketotifen  (ZADITOR ) 0.025 % ophthalmic solution Place 2 drops into both eyes daily as needed (for irritation). 5 mL 0   losartan  (COZAAR ) 50 MG tablet Take 1 tablet (50 mg total) by mouth 2 (two) times daily. 180 tablet 2   meloxicam  (MOBIC ) 15 MG tablet Take 1 tablet (15 mg total) by mouth daily. 90 tablet 1   metoprolol  succinate (TOPROL -XL) 50 MG 24 hr tablet Take 1 tablet (50 mg total) by mouth daily. 90 tablet 3   montelukast  (SINGULAIR ) 10 MG tablet Take 1 tablet (10  mg total) by mouth at bedtime. 90 tablet 1   Multiple Vitamins-Minerals (ONE-A-DAY WOMENS 50+ ADVANTAGE PO) Take 1 tablet by mouth daily. Unknown strength     omeprazole  (PRILOSEC) 40 MG capsule Take 1 capsule (40 mg total) by mouth daily. 90 capsule 3   ondansetron  (ZOFRAN ) 4 MG tablet Take 1 tablet (4 mg total) by mouth every 8 (eight) hours as needed for nausea or vomiting. 20 tablet 0   simvastatin  (ZOCOR ) 40 MG tablet Take 1 tablet (40 mg total) by mouth at bedtime. 90 tablet 0   spironolactone  (ALDACTONE ) 25 MG tablet TAKE 1/2 TABLET BY MOUTH EVERY DAY 45 tablet 3   promethazine -dextromethorphan (PROMETHAZINE -DM) 6.25-15 MG/5ML syrup Take 5 mLs by mouth 4 (four) times daily as needed. (Patient not taking: Reported on 08/17/2023) 180 mL 0   predniSONE  (DELTASONE ) 10 MG tablet 3 tabs x3 days and  then 2 tabs x3 days and then 1 tab x3 days.  Take w/ food. 18 tablet 0   No facility-administered medications prior to visit.     Review of Systems:   Constitutional:   No  weight loss, night sweats,  Fevers, chills, fatigue, or  lassitude.  HEENT:   No headaches,  Difficulty swallowing,  Tooth/dental problems, or  Sore throat,                No sneezing, itching, ear ache, +nasal congestion, post nasal drip,   CV:  No chest pain,  Orthopnea, PND, swelling in lower extremities, anasarca, dizziness, palpitations, syncope.   GI  No heartburn, indigestion, abdominal pain, nausea, vomiting, diarrhea, change in bowel habits, loss of appetite, bloody stools.   Resp: .  No chest wall deformity  Skin: no rash or lesions.  GU: no dysuria, change in color of urine, no urgency or frequency.  No flank pain, no hematuria   MS:  No joint pain or swelling.  No decreased range of motion.  No back pain.    Physical Exam  BP 110/80 (BP Location: Left Wrist, Patient Position: Sitting, Cuff Size: Normal)   Pulse 94   Temp 98.2 F (36.8 C) (Oral)   Ht 5\' 3"  (1.6 m)   Wt 275 lb 3.2 oz (124.8 kg)   SpO2  97%   BMI 48.75 kg/m   GEN: A/Ox3; pleasant , NAD, well nourished    HEENT:  [place]/AT,   NOSE-clear, THROAT-clear, no lesions, no postnasal drip or exudate noted.   NECK:  Supple w/ fair ROM; no JVD; normal carotid impulses w/o bruits; no thyromegaly or nodules palpated; no lymphadenopathy.    RESP  Clear  P & A; w/o, wheezes/ rales/ or rhonchi. no accessory muscle use, no dullness to percussion  CARD:  RRR, no m/r/g, no peripheral edema, pulses intact, no cyanosis or clubbing.  GI:   Soft & nt; nml bowel sounds; no organomegaly or masses detected.   Musco: Warm bil, no deformities or joint swelling noted.   Neuro: alert, no focal deficits noted.    Skin: Warm, no lesions or rashes    Lab Results:  CBC    Component Value Date/Time   WBC 7.0 06/24/2023 1357   WBC 7.8 03/20/2023 0854   RBC 4.23 06/24/2023 1357   RBC 4.49 03/20/2023 0854   HGB 12.5 06/24/2023 1357   HCT 38.0 06/24/2023 1357   PLT 261 06/24/2023 1357   MCV 90 06/24/2023 1357   MCH 29.6 06/24/2023 1357   MCH 28.3 04/27/2021 2240   MCHC 32.9 06/24/2023 1357   MCHC 31.8 03/20/2023 0854   RDW 13.0 06/24/2023 1357   LYMPHSABS 2.9 06/24/2023 1357   MONOABS 0.5 03/20/2023 0854   EOSABS 0.5 (H) 06/24/2023 1357   BASOSABS 0.1 06/24/2023 1357    BMET    Component Value Date/Time   NA 138 03/20/2023 0854   NA 141 01/10/2022 1054   K 4.1 03/20/2023 0854   CL 100 03/20/2023 0854   CO2 30 03/20/2023 0854   GLUCOSE 105 (H) 03/20/2023 0854   BUN 16 03/20/2023 0854   BUN 12 01/10/2022 1054   CREATININE 0.78 03/20/2023 0854   CALCIUM  9.7 03/20/2023 0854   GFRNONAA >60 04/27/2021 2240   GFRAA >90 06/01/2013 0925    BNP No results found for: "BNP"  ProBNP No results found for: "PROBNP"  Imaging: No results found.  Administration History     None  Latest Ref Rng & Units 06/24/2022    2:45 PM  PFT Results  FVC-Pre L 2.71   FVC-Predicted Pre % 81   FVC-Post L 2.64   FVC-Predicted Post  % 79   Pre FEV1/FVC % % 77   Post FEV1/FCV % % 90   FEV1-Pre L 2.10   FEV1-Predicted Pre % 80   FEV1-Post L 2.38   DLCO uncorrected ml/min/mmHg 22.88   DLCO UNC% % 114   DLCO corrected ml/min/mmHg 22.88   DLCO COR %Predicted % 114   DLVA Predicted % 127   TLC L 4.53   TLC % Predicted % 92   RV % Predicted % 81     No results found for: "NITRICOXIDE"      Assessment & Plan:   No problem-specific Assessment & Plan notes found for this encounter.     Roena Clark, NP 08/17/2023

## 2023-08-17 NOTE — Patient Instructions (Addendum)
 Wixela 1 puff Twice daily-rinse after use Continue on Zyrtec and Singulair  daily  Continue Astelin  and Flonase  daily  Change Albuterol  inhaler to Airsupra  2 puffs every 6hr as needed-wheezing/cough Asthma action plan as discussed.  Continue on Omeprazole  daily .  Follow up with Allergy  as planned   Follow up with Dr. Diania Fortes or Ayson Cherubini NP in 4 months (30 min slot ) .  Please contact office for sooner follow up if symptoms do not improve or worsen or seek emergency care

## 2023-08-20 ENCOUNTER — Ambulatory Visit

## 2023-08-20 NOTE — Assessment & Plan Note (Signed)
Continue on CPAP at bedtime.  Continue follow-up with neurology °

## 2023-08-20 NOTE — Assessment & Plan Note (Addendum)
 Continue on current maintenance regimen and follow-up with allergist

## 2023-08-20 NOTE — Assessment & Plan Note (Signed)
 Moderate persistent asthma-patient with recent exacerbation in the setting of RSV and strep throat.  Clinically is improving after antibiotics and steroid.  Continue on aggressive maintenance regimen.  Change albuterol  to Airsupra .  Asthma action plan discussed.  Continue follow-up with allergist.  Plan  Patient Instructions  Wixela 1 puff Twice daily-rinse after use Continue on Zyrtec and Singulair  daily  Continue Astelin  and Flonase  daily  Change Albuterol  inhaler to Airsupra  2 puffs every 6hr as needed-wheezing/cough Asthma action plan as discussed.  Continue on Omeprazole  daily .  Follow up with Allergy  as planned   Follow up with Dr. Diania Fortes or Yaretzy Olazabal NP in 4 months (30 min slot ) .  Please contact office for sooner follow up if symptoms do not improve or worsen or seek emergency care

## 2023-08-25 ENCOUNTER — Ambulatory Visit (INDEPENDENT_AMBULATORY_CARE_PROVIDER_SITE_OTHER)

## 2023-08-25 DIAGNOSIS — B999 Unspecified infectious disease: Secondary | ICD-10-CM | POA: Diagnosis not present

## 2023-08-25 DIAGNOSIS — Z23 Encounter for immunization: Secondary | ICD-10-CM | POA: Diagnosis not present

## 2023-08-25 NOTE — Progress Notes (Signed)
 Immunotherapy   Patient Details  Name: Renee Flores MRN: [id number] Date of Birth: [birth year]  08/25/2023  Renee Flores  received the Pneumococcal Vaccine Polyvalent- Pneumovax 23 in office. Informed patient of side effects. Patient waited 15 minutes after administration with no issues.  Informed to get repeat pneumococcal titers 4-6 weeks from today.  NDC: 4098-1191-47 WGN:[id number] Exp Date: 04-02-2025 Dose:0.59mL IM     Consent signed and patient instructions given.   Steele Edelson Mendez-Mungaray 08/25/2023, 11:40 AM

## 2023-09-16 ENCOUNTER — Ambulatory Visit: Payer: Self-pay | Admitting: Adult Health

## 2023-09-16 ENCOUNTER — Telehealth: Payer: Self-pay

## 2023-09-16 NOTE — Telephone Encounter (Signed)
 I called and spoke to pt. Pt states she has had this cough. In April,  she had the Prevnar 23 vaccine. After this vaccine, her cough became more productive and the cough is wet. The mucous is green. Pt complains of S.O.B (mild) and the s.o.b happens when she has a bad cough spell. Pt denies any fever or chest pain. Pt is taking Wixela and Airsurpa. Pt states the inhalers help open her up but not much. Pt wants something to get rid of the mucous. She has tried otc meds including mucinex. Please advise.

## 2023-09-16 NOTE — Telephone Encounter (Signed)
 Copied from CRM [id number]. Topic: Clinical - Red Word Triage >> Sep 16, 2023 10:57 AM Eveleen Hinds B wrote: [facility] that prompted transfer to Nurse Triage: Persistent cough hard to catch breath, green mucous,   Chief Complaint: Productive Cough/Shortness of breath on exertion, congestion Symptoms: productive cough with green phlegm, chest congestion,  Frequency: x 2 months Pertinent Negatives: Patient denies fever, nausea, vomiting, diarrhea, short of breath on exertion, dizziness, chest pain Disposition: [] ED /[] Urgent Care (no appt availability in office) / [] Appointment(In office/virtual)/ []  [facility]/ [] Home Care/ [x] Refused Recommended Disposition /[] [facility]/ []  Follow-up with PCP Additional Notes: Patient called and advised that she has been having a productive cough with green phlegm x 2 months. Patient had a pneumonia shot April 29th and ever since then patient states that her productive cough has gotten worse. Patient is having mild difficulty breathing on exertion---worse than normal.   Wixela Inhaler--Inhale 1 puff in the morning and at bedtime Airsupra  Inhaler--Inhale 2 puffs into the lungs every 6 hours as needed  Patient wears a CPAP at night.  Patient is advised that the disposition at this time is that she is seen in the next 4 hours for further evaluation of her symptoms.  There are no available appointments in the office at this time for today and Urgent Care was offered as an alternative.  Patient wanted her pulmonary office to call her back instead of going to Urgent Care at this time. Patient was transferred to the CAL for further assistance for best patient care. Patient is also advised that if anything worsens to go to the Emergency Room.  Patient verbalized understanding.     Reason for Disposition  [1] MILD difficulty breathing (e.g., minimal/no SOB at rest, SOB with walking, pulse <100) AND [2] NEW-onset or WORSE than normal  Answer  Assessment - Initial Assessment Questions E2C2 Pulmonary Triage - Initial Assessment Questions "Chief Complaint (e.g., cough, sob, wheezing, fever, chills, sweat or additional symptoms) *Go to specific symptom protocol after initial questions. Shortness of breath on exertion, productive cough with green phlegm  "How long have symptoms been present?" 2 months but worse in the past month  Have you tested for COVID or Flu? Note: If not, ask patient if a home test can be taken. If so, instruct patient to call back for positive results. No  MEDICINES:   "Have you used any OTC meds to help with symptoms?" Yes If yes, ask "What medications?" Cough & Congestion medications  "Have you used your inhalers/maintenance medication?" Yes If yes, "What medications?" Wixela Inhaler--Inhale 1 puff in the morning and at bedtime Airsupra  Inhaler--Inhale 2 puffs into the lungs every 6 hours as needed  If inhaler, ask "How many puffs and how often?" Note: Review instructions on medication in the chart. Wixela Inhaler--Inhale 1 puff in the morning and at bedtime Airsupra  Inhaler--Inhale 2 puffs into the lungs every 6 hours as needed  OXYGEN: "Do you wear supplemental oxygen?" No If yes, "How many liters are you supposed to use?" N/a  "Do you monitor your oxygen levels?" No If yes, "What is your reading (oxygen level) today?" N/a  "What is your usual oxygen saturation reading?"  (Note: Pulmonary O2 sats should be 90% or greater) ----    3. PATTERN "Does the difficult breathing come and go, or has it been constant since it started?"      Short of breath on exertion 4. SEVERITY: "How bad is your breathing?" (e.g., mild, moderate, severe)    -  MILD: No SOB at rest, mild SOB with walking, speaks normally in sentences, can lie down, no retractions, pulse < 100.    - MODERATE: SOB at rest, SOB with minimal exertion and prefers to sit, cannot lie down flat, speaks in phrases, mild retractions, audible  wheezing, pulse 100-120.    - SEVERE: Very SOB at rest, speaks in single words, struggling to breathe, sitting hunched forward, retractions, pulse > 120      Mild 5. RECURRENT SYMPTOM: "Have you had difficulty breathing before?" If Yes, ask: "When was the last time?" and "What happened that time?"      Yes year or so ago---given antibiotics and steroids and then sent to pulmonologist---changed inhalers  6. CARDIAC HISTORY: "Do you have any history of heart disease?" (e.g., heart attack, angina, bypass surgery, angioplasty)      Mitral valve leak per patient 7. LUNG HISTORY: "Do you have any history of lung disease?"  (e.g., pulmonary embolus, asthma, emphysema)     Asthma 8. CAUSE: "What do you think is causing the breathing problem?"      --- 9. OTHER SYMPTOMS: "Do you have any other symptoms? (e.g., dizziness, runny nose, cough, chest pain, fever)     Productive cough with green phlegm 11. PREGNANCY: "Is there any chance you are pregnant?" "When was your last menstrual period?"       No---hysterectomy at age 58 12. TRAVEL: "Have you traveled out of the country in the last month?" (e.g., travel history, exposures)       no  Protocols used: Breathing Difficulty-A-AH

## 2023-09-16 NOTE — Telephone Encounter (Signed)
FYI see other note.

## 2023-09-17 MED ORDER — CEFDINIR 300 MG PO CAPS: 300.0000 mg | ORAL_CAPSULE | Freq: Two times a day (BID) | ORAL | 0 refills | Status: DC

## 2023-09-17 NOTE — Telephone Encounter (Signed)
 Can Begin Omnicef  300mg  Twice daily  #14 take as directed. Sent to [facility] pharmacy on file.  Make sure she has follow up from [facility]. If not better will need sooner OV  Please contact office for sooner follow up if symptoms do not improve or worsen or seek emergency care

## 2023-09-17 NOTE — Telephone Encounter (Signed)
 I called and spoke to pt. Pt informed of Tammy's note and verbalized understanding. NFN

## 2023-09-17 NOTE — Addendum Note (Signed)
 Addended by: Drema Genta on: 09/17/2023 09:33 AM   Modules accepted: Orders

## 2023-09-22 ENCOUNTER — Ambulatory Visit (INDEPENDENT_AMBULATORY_CARE_PROVIDER_SITE_OTHER): Payer: PPO | Admitting: Family Medicine

## 2023-09-22 ENCOUNTER — Encounter: Payer: Self-pay | Admitting: Family Medicine

## 2023-09-22 VITALS — BP 130/80 | HR 80 | Temp 98.0°F | Ht 62.0 in | Wt 276.0 lb

## 2023-09-22 DIAGNOSIS — Z0000 Encounter for general adult medical examination without abnormal findings: Secondary | ICD-10-CM

## 2023-09-22 DIAGNOSIS — E559 Vitamin D deficiency, unspecified: Secondary | ICD-10-CM | POA: Diagnosis not present

## 2023-09-22 LAB — BASIC METABOLIC PANEL WITH GFR
BUN: 17 mg/dL (ref 6–23)
CO2: 28 meq/L (ref 19–32)
Calcium: 9.8 mg/dL (ref 8.4–10.5)
Chloride: 101 meq/L (ref 96–112)
Creatinine, Ser: 0.92 mg/dL (ref 0.40–1.20)
GFR: 69.43 mL/min (ref >60.00)
Glucose, Bld: 116 mg/dL — ABNORMAL HIGH (ref 70–99)
Potassium: 4.2 meq/L (ref 3.5–5.1)
Sodium: 138 meq/L (ref 135–145)

## 2023-09-22 LAB — CBC WITH DIFFERENTIAL/PLATELET
Basophils Absolute: 0.1 10*3/uL (ref 0.0–0.1)
Basophils Relative: 0.8 % (ref 0.0–3.0)
Eosinophils Absolute: 0.9 10*3/uL — ABNORMAL HIGH (ref 0.0–0.7)
Eosinophils Relative: 10.5 % — ABNORMAL HIGH (ref 0.0–5.0)
HCT: 38.4 % (ref 36.0–46.0)
Hemoglobin: 12.7 g/dL (ref 12.0–15.0)
Lymphocytes Relative: 27.7 % (ref 12.0–46.0)
Lymphs Abs: 2.3 10*3/uL (ref 0.7–4.0)
MCHC: 33 g/dL (ref 30.0–36.0)
MCV: 87.2 fl (ref 78.0–100.0)
Monocytes Absolute: 0.7 10*3/uL (ref 0.1–1.0)
Monocytes Relative: 9 % (ref 3.0–12.0)
Neutro Abs: 4.3 10*3/uL (ref 1.4–7.7)
Neutrophils Relative %: 52 % (ref 43.0–77.0)
Platelets: 255 10*3/uL (ref 150.0–400.0)
RBC: 4.41 Mil/uL (ref 3.87–5.11)
RDW: 13.1 % (ref 11.5–15.5)
WBC: 8.2 10*3/uL (ref 4.0–10.5)

## 2023-09-22 LAB — LIPID PANEL
Cholesterol: 161 mg/dL (ref 0–200)
HDL: 65.3 mg/dL (ref >39.00)
LDL Cholesterol: 70 mg/dL (ref 0–99)
NonHDL: 95.55
Total CHOL/HDL Ratio: 2
Triglycerides: 129 mg/dL (ref 0.0–149.0)
VLDL: 25.8 mg/dL (ref 0.0–40.0)

## 2023-09-22 LAB — MICROALBUMIN / CREATININE URINE RATIO
Creatinine,U: 77.3 mg/dL
Microalb Creat Ratio: 25.2 mg/g (ref 0.0–30.0)
Microalb, Ur: 1.9 mg/dL (ref 0.0–1.9)

## 2023-09-22 LAB — TSH: TSH: 2.13 u[IU]/mL (ref 0.35–5.50)

## 2023-09-22 LAB — VITAMIN D 25 HYDROXY (VIT D DEFICIENCY, FRACTURES): VITD: 30.15 ng/mL (ref 30.00–100.00)

## 2023-09-22 LAB — HEPATIC FUNCTION PANEL
ALT: 36 U/L — ABNORMAL HIGH (ref 0–35)
AST: 27 U/L (ref 0–37)
Albumin: 4.4 g/dL (ref 3.5–5.2)
Alkaline Phosphatase: 66 U/L (ref 39–117)
Bilirubin, Direct: 0.1 mg/dL (ref 0.0–0.3)
Total Bilirubin: 0.6 mg/dL (ref 0.2–1.2)
Total Protein: 7.9 g/dL (ref 6.0–8.3)

## 2023-09-22 LAB — HEMOGLOBIN A1C: Hgb A1c MFr Bld: 6.2 % (ref 4.6–6.5)

## 2023-09-22 NOTE — Progress Notes (Signed)
 Subjective:    Patient ID: Renee Flores, female    DOB: [birth year], 58 y.o.   MRN: [id number]  HPI CPE- due for microalbumin, mammo.  UTD on PNA, Tdap, colonoscopy  Patient Care Team    Relationship Specialty Notifications Start End  Jess Morita, MD PCP - General Family Medicine  04/13/20   Manfred Seed, MD PCP - Cardiology Cardiology  04/30/20   Dohmeier, Raoul Byes, MD Consulting Physician Neurology  09/11/20   Brandt Cake, [place] Consulting Physician Podiatry  09/11/20   Donnamarie Gables, MD Consulting Physician Orthopedic Surgery  09/11/20   Myrle Aspen, [facility] (Inactive) Pharmacist Pharmacist  06/17/21    Comment: [phone]     Health Maintenance  Topic Date Due   Diabetic kidney evaluation - Urine ACR  Never done   COVID-19 Vaccine (4 - 2024-25 season) 12/28/2022   MAMMOGRAM  09/15/2023   Medicare Annual Wellness (AWV)  10/02/2023   Zoster Vaccines- Shingrix (1 of 2) 09/30/2023 (Originally 11/09/2016)   Pneumococcal Vaccine 73-50 Years old (1 of 2 - PCV) 06/29/2024 (Originally 11/09/1985)   INFLUENZA VACCINE  11/27/2023   Diabetic kidney evaluation - eGFR measurement  03/19/2024   Colonoscopy  08/04/2029   DTaP/Tdap/Td (3 - Td or Tdap) 09/13/2031   Hepatitis C Screening  Completed   HIV Screening  Completed   HPV VACCINES  Aged Out   Meningococcal B Vaccine  Aged Out      Review of Systems Patient reports no vision/ hearing changes, adenopathy,fever, weight change,  persistant/recurrent hoarseness , swallowing issues, chest pain, palpitations, edema, persistant/recurrent cough, hemoptysis, dyspnea above baseline (rest/exertional/paroxysmal nocturnal), gastrointestinal bleeding (melena, rectal bleeding), abdominal pain, significant heartburn, bowel changes, GU symptoms (dysuria, hematuria, incontinence), Gyn symptoms (abnormal  bleeding, pain),  syncope, focal weakness, memory loss, numbness & tingling, skin/hair/nail changes, abnormal bruising or  bleeding, anxiety, or depression.     Objective:   Physical Exam General Appearance:    Alert, cooperative, no distress, appears stated age, obese  Head:    Normocephalic, without obvious abnormality, atraumatic  Eyes:    PERRL, conjunctiva/corneas clear, EOM's intact both eyes  Ears:    Normal TM's and external ear canals, both ears  Nose:   Nares normal, septum midline, mucosa normal, no drainage    or sinus tenderness  Throat:   Lips, mucosa, and tongue normal; teeth and gums normal  Neck:   Supple, symmetrical, trachea midline, no adenopathy;    Thyroid : no enlargement/tenderness/nodules  Back:     Symmetric, no curvature, ROM normal, no CVA tenderness  Lungs:     Clear to auscultation bilaterally, respirations unlabored  Chest Wall:    No tenderness or deformity   Heart:    Regular rate and rhythm, S1 and S2 normal, no murmur, rub   or gallop  Breast Exam:    Deferred to GYN  Abdomen:     Soft, non-tender, bowel sounds active all four quadrants,    no masses, no organomegaly  Genitalia:    Deferred to GYN  Rectal:    Extremities:   Extremities normal, atraumatic, no cyanosis or edema  Pulses:   2+ and symmetric all extremities  Skin:   Skin color, texture, turgor normal, no rashes or lesions  Lymph nodes:   Cervical, supraclavicular, and axillary nodes normal  Neurologic:   CNII-XII intact, normal strength, sensation and reflexes    throughout          Assessment & Plan:

## 2023-09-22 NOTE — Assessment & Plan Note (Signed)
 Pt's PE WNL w/ exception of BMI.  UTD on colonoscopy, immunizations.  Plans to schedule mammo.  Check labs.  Anticipatory guidance provided.

## 2023-09-22 NOTE — Patient Instructions (Signed)
 Follow up in 6 months to recheck BP and cholesterol We'll notify you of your lab results and make any changes if needed Continue to work on healthy diet and regular exercise- you can do it! Schedule your mammogram at your convenience Call with any questions or concerns Stay Safe!  Stay Healthy! Have a great summer!!

## 2023-09-23 ENCOUNTER — Ambulatory Visit: Payer: Self-pay | Admitting: Family Medicine

## 2023-09-26 ENCOUNTER — Other Ambulatory Visit: Payer: Self-pay | Admitting: Allergy

## 2023-09-27 IMAGING — US US ABDOMEN LIMITED
1 series · 14 of 25 positions shown · non-contrast
Comparison: None Available.

CLINICAL DATA: Elevated LFTs

EXAM:
ULTRASOUND ABDOMEN LIMITED RIGHT UPPER QUADRANT

[Series 1: us abdomen limited ruq (liver/gb) · 14 of 33 slices shown]
[im 1/33]
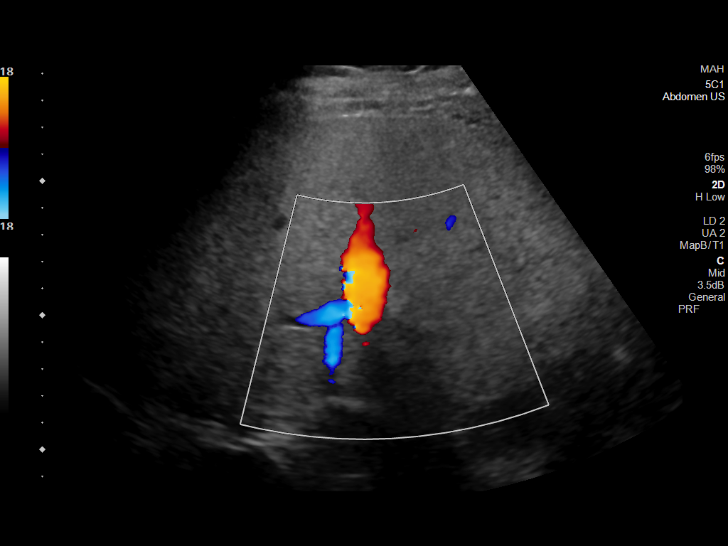
[im 3/33]
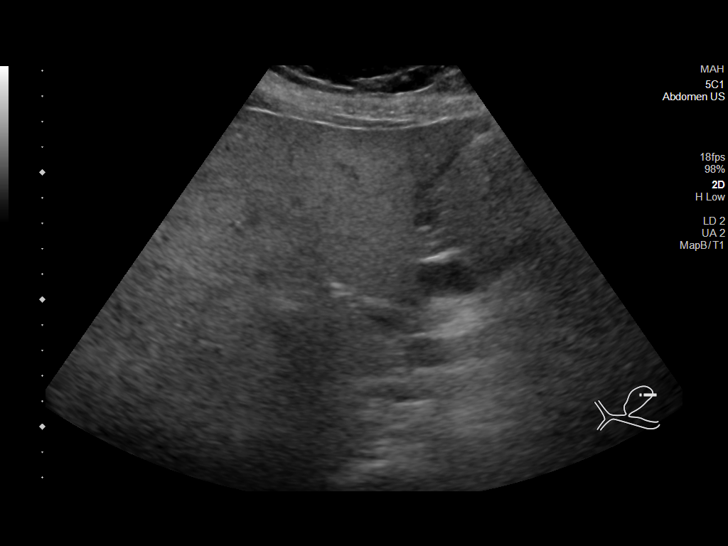
[im 6/33]
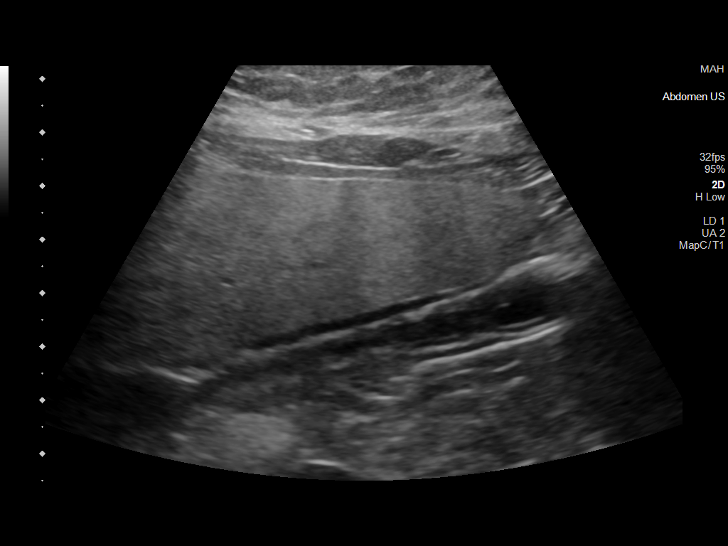
[im 9/33]
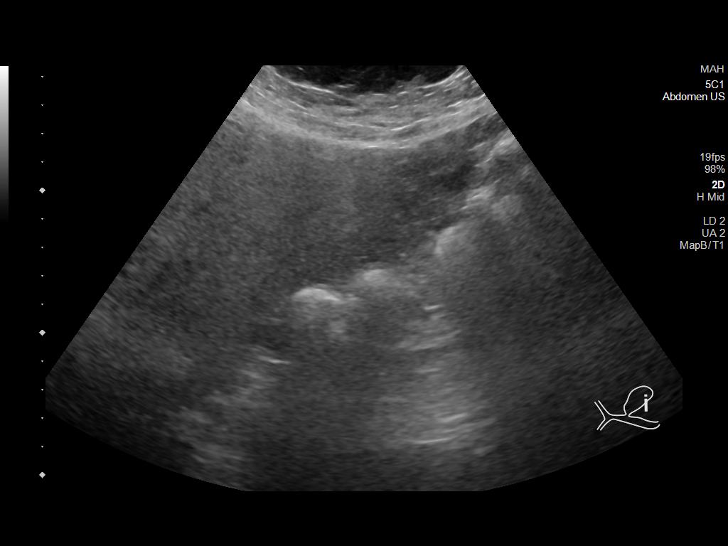
[im 11/33]
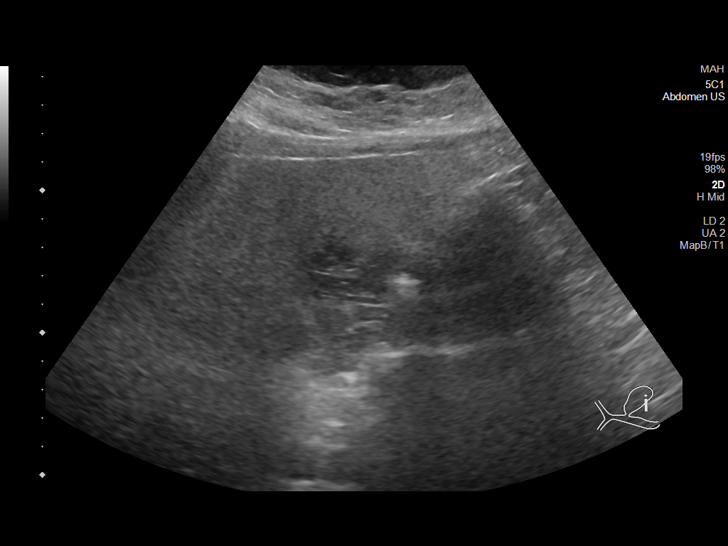
[im 13/33]
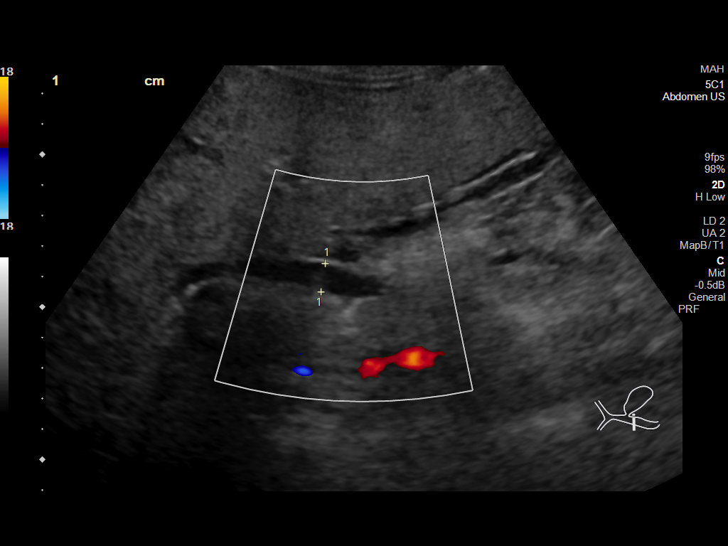
[im 15/33]
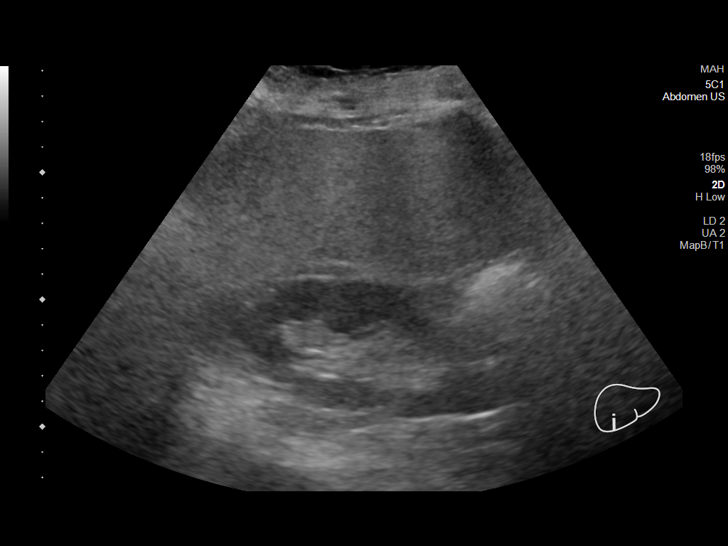
[im 18/33]
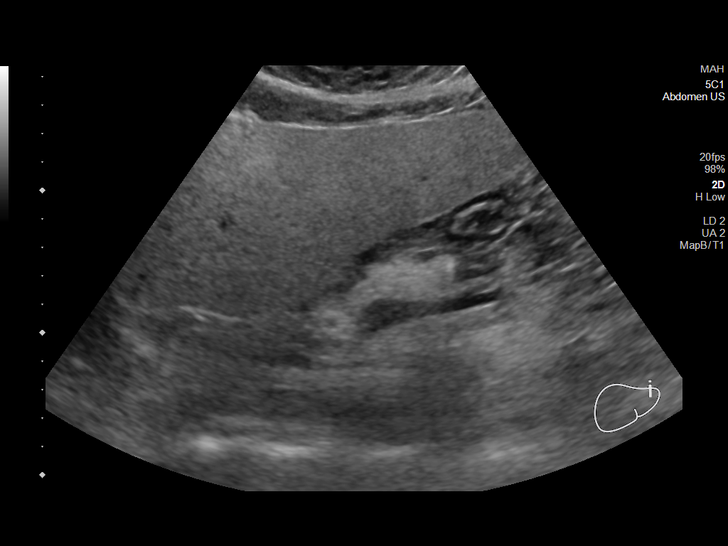
[im 21/33]
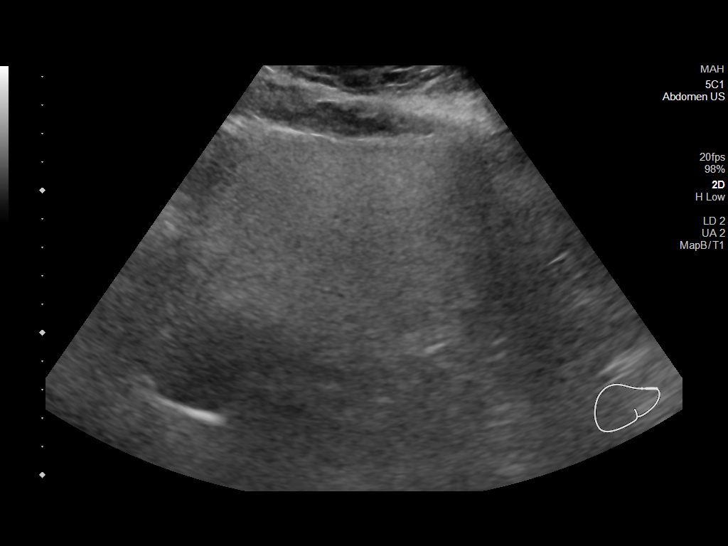
[im 22/33]
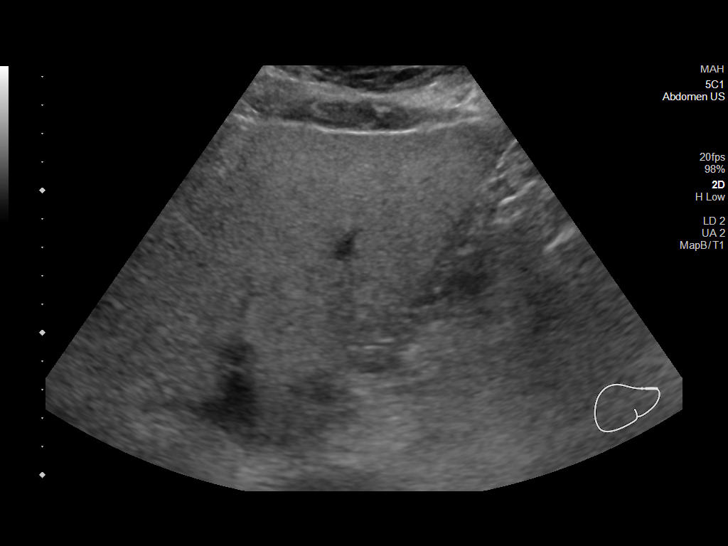
[im 25/33]
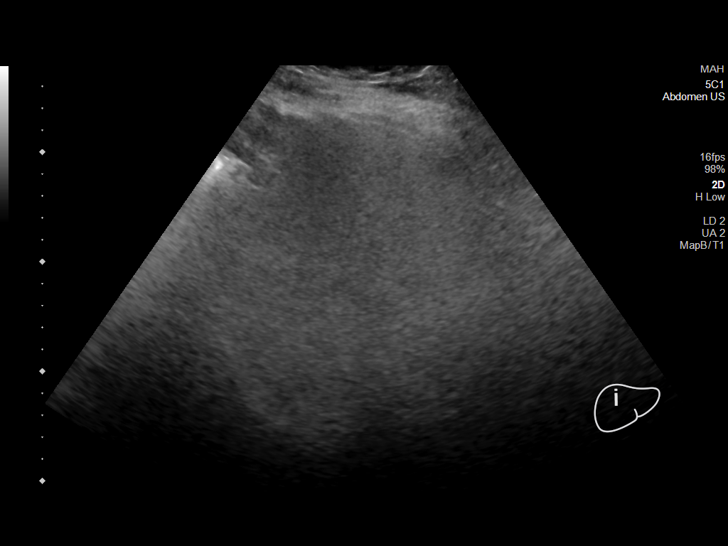
[im 27/33]
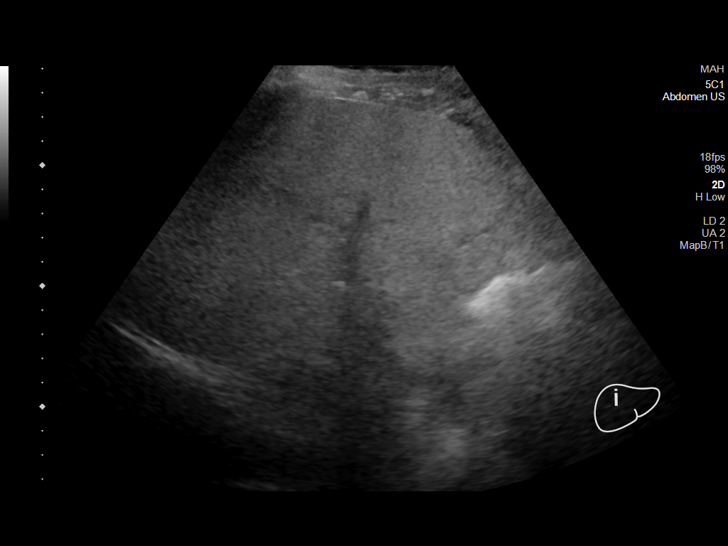
[im 30/33]
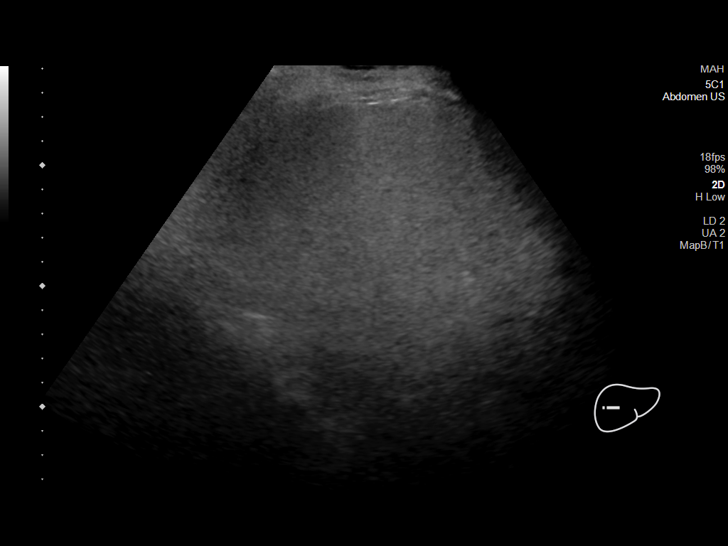
[im 33/33]
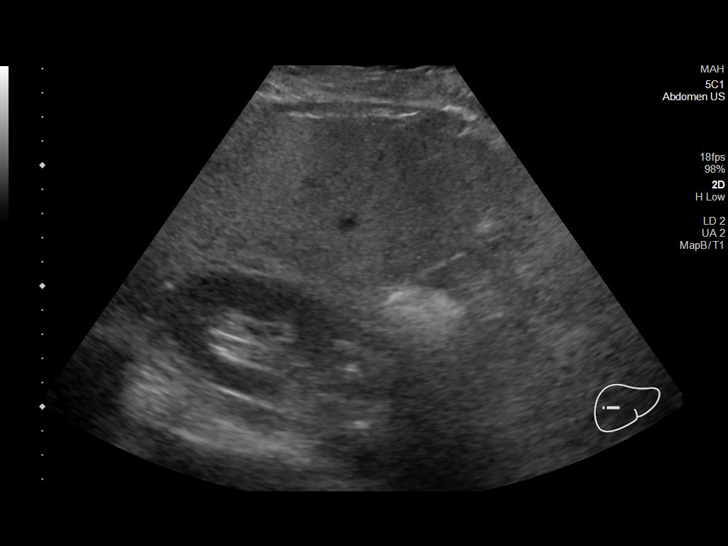

[14 of 25 positions shown; findings below may reference images not displayed]

FINDINGS: Gallbladder:

Surgically absent.

Common bile duct:

Diameter: 9 mm

Liver:

Significantly increased echogenicity of the parenchyma with no focal
mass identified. Portal vein is patent on color Doppler imaging with
normal direction of blood flow towards the liver.

Other: None.
IMPRESSION: 1. Increased echogenicity of the liver parenchyma which may
represent hepatic steatosis.
2. Mildly dilated common bile duct, possibly compensatory from
cholecystectomy. Correlate clinically and consider MRCP if
indicated.

## 2023-10-01 ENCOUNTER — Telehealth: Payer: Self-pay

## 2023-10-01 ENCOUNTER — Other Ambulatory Visit: Payer: Self-pay

## 2023-10-01 ENCOUNTER — Encounter: Payer: Self-pay | Admitting: Allergy

## 2023-10-01 ENCOUNTER — Ambulatory Visit: Payer: PPO | Admitting: Allergy

## 2023-10-01 VITALS — BP 130/86 | HR 81 | Temp 98.2°F | Resp 16 | Ht 62.0 in | Wt 276.8 lb

## 2023-10-01 DIAGNOSIS — B999 Unspecified infectious disease: Secondary | ICD-10-CM | POA: Diagnosis not present

## 2023-10-01 DIAGNOSIS — K219 Gastro-esophageal reflux disease without esophagitis: Secondary | ICD-10-CM | POA: Diagnosis not present

## 2023-10-01 DIAGNOSIS — J988 Other specified respiratory disorders: Secondary | ICD-10-CM

## 2023-10-01 DIAGNOSIS — J301 Allergic rhinitis due to pollen: Secondary | ICD-10-CM | POA: Diagnosis not present

## 2023-10-01 DIAGNOSIS — J3089 Other allergic rhinitis: Secondary | ICD-10-CM

## 2023-10-01 DIAGNOSIS — J4540 Moderate persistent asthma, uncomplicated: Secondary | ICD-10-CM | POA: Diagnosis not present

## 2023-10-01 MED ORDER — AMOXICILLIN-POT CLAVULANATE 875-125 MG PO TABS: 1.0000 | ORAL_TABLET | Freq: Two times a day (BID) | ORAL | 0 refills | Status: DC

## 2023-10-01 MED ORDER — PREDNISONE 10 MG PO TABS: ORAL_TABLET | ORAL | 0 refills | Status: DC

## 2023-10-01 NOTE — Telephone Encounter (Signed)
 Lab requisition form has been placed to be mailed out to the address on file. I called the patient and informed.

## 2023-10-01 NOTE — Patient Instructions (Addendum)
 Bronchitis/infection Start prednisone  taper. Prednisone  10mg  tablets - take 2 tablets for 4 days then 1 tablet on day 5.  Take Augmentin  875mg  twice a day for 10 days.  If you are not better let us  know.   Environmental allergies 2025 skin testing positive to grass, dust mites and mold. Continue environmental control measures as below. Use over the counter antihistamines such as Zyrtec (cetirizine), Claritin  (loratadine ), Allegra (fexofenadine), or Xyzal (levocetirizine) daily as needed. May take twice a day during allergy  flares. May switch antihistamines every few months. Use azelastine  nasal spray 1-2 sprays per nostril twice a day as needed for runny nose/drainage. Use Flonase  (fluticasone ) nasal spray 1-2 sprays per nostril once a day as needed for nasal congestion.  Nasal saline spray (i.e., Simply Saline) or nasal saline lavage (i.e., NeilMed) is recommended as needed and prior to medicated nasal sprays. Consider allergy  injections for long term control if above medications do not help the symptoms.  Asthma must be in better control before starting.   Infections Keep track of infections and antibiotics use. Get bloodwork to look at vaccine response in 1 month when not sick.   Asthma Consider biologics for asthma control. Ask your pulmonologist at your next visit.   Continue plan as per pulmonology. Daily controller medication(s): Continue Wixela 250mcg 1 puff twice a day and rinse mouth after each use.  Continue Singulair  (montelukast ) 10mg  daily at night. May use Airsupra  rescue inhaler 2 puffs every 4 to 6 hours as needed for shortness of breath, chest tightness, coughing, and wheezing. Do not use more than 12 puffs in 24 hours. May use Airsupra  rescue inhaler 2 puffs 5 to 15 minutes prior to strenuous physical activities. Rinse mouth after each use.  Monitor frequency of use - if you need to use it more than twice per week on a consistent basis let us  know.  Breathing control  goals:  Full participation in all desired activities (may need albuterol  before activity) Albuterol  use two times or less a week on average (not counting use with activity) Cough interfering with sleep two times or less a month Oral steroids no more than once a year No hospitalizations   GERD Continue lifestyle and dietary modifications. Continue omeprazole  40mg  once day - nothing to eat or drink for 20-30 minutes afterwards.   Follow up in 3 months or sooner if needed.   Reducing Pollen Exposure Pollen seasons: trees (spring), grass (summer) and ragweed/weeds (fall). Keep windows closed in your home and car to lower pollen exposure.  Install air conditioning in the bedroom and throughout the house if possible.  Avoid going out in dry windy days - especially early morning. Pollen counts are highest between 5 - 10 AM and on dry, hot and windy days.  Save outside activities for late afternoon or after a heavy rain, when pollen levels are lower.  Avoid mowing of grass if you have grass pollen allergy . Be aware that pollen can also be transported indoors on people and pets.  Dry your clothes in an automatic dryer rather than hanging them outside where they might collect pollen.  Rinse hair and eyes before bedtime.  Control of House Dust Mite Allergen Dust mite allergens are a common trigger of allergy  and asthma symptoms. While they can be found throughout the house, these microscopic creatures thrive in warm, humid environments such as bedding, upholstered furniture and carpeting. Because so much time is spent in the bedroom, it is essential to reduce mite levels there.  Encase  pillows, mattresses, and box springs in special allergen-proof fabric covers or airtight, zippered plastic covers.  Bedding should be washed weekly in hot water (130 F) and dried in a hot dryer. Allergen-proof covers are available for comforters and pillows that can't be regularly washed.  Wash the allergy -proof  covers every few months. Minimize clutter in the bedroom. Keep pets out of the bedroom.  Keep humidity less than 50% by using a dehumidifier or air conditioning. You can buy a humidity measuring device called a hygrometer to monitor this.  If possible, replace carpets with hardwood, linoleum, or washable area rugs. If that's not possible, vacuum frequently with a vacuum that has a HEPA filter. Remove all upholstered furniture and non-washable window drapes from the bedroom. Remove all non-washable stuffed toys from the bedroom.  Wash stuffed toys weekly.  Mold Control Mold and fungi can grow on a variety of surfaces provided certain temperature and moisture conditions exist.  Outdoor molds grow on plants, decaying vegetation and soil. The major outdoor mold, Alternaria and Cladosporium, are found in very high numbers during hot and dry conditions. Generally, a late summer - fall peak is seen for common outdoor fungal spores. Rain will temporarily lower outdoor mold spore count, but counts rise rapidly when the rainy period ends. The most important indoor molds are Aspergillus and Penicillium. Dark, humid and poorly ventilated basements are ideal sites for mold growth. The next most common sites of mold growth are the bathroom and the kitchen. Outdoor (Seasonal) Mold Control Use air conditioning and keep windows closed. Avoid exposure to decaying vegetation. Avoid leaf raking. Avoid grain handling. Consider wearing a face mask if working in moldy areas.  Indoor (Perennial) Mold Control  Maintain humidity below 50%. Get rid of mold growth on hard surfaces with water, detergent and, if necessary, 5% bleach (do not mix with other cleaners). Then dry the area completely. If mold covers an area more than 10 square feet, consider hiring an indoor environmental professional. For clothing, washing with soap and water is best. If moldy items cannot be cleaned and dried, throw them away. Remove sources e.g.  contaminated carpets. Repair and seal leaking roofs or pipes. Using dehumidifiers in damp basements may be helpful, but empty the water and clean units regularly to prevent mildew from forming. All rooms, especially basements, bathrooms and kitchens, require ventilation and cleaning to deter mold and mildew growth. Avoid carpeting on concrete or damp floors, and storing items in damp areas.

## 2023-10-01 NOTE — Progress Notes (Unsigned)
 Follow Up Note  RE: Renee Flores MRN: [id number] DOB: [birth year] Date of Office Visit: 10/01/2023  Referring provider: Jess Morita, MD Primary care provider: Jess Morita, MD  Chief Complaint: Allergic Rhinitis  and Other (After the pneumonia shot has more mucus build up and cough that sounds like a chest cold - was put on cefdinir  by pulmonary doctor and completed it // still having issues )  History of Present Illness: I had the pleasure of seeing Renee Flores for a [facility]. She is a 58 y.o. female, who is being followed for allergic rhinitis, recurrent infections, asthma, GERD. Her previous allergy  office visit was on 06/04/2023 with Dr. Burdette Carolin. Today is a regular follow up visit.  Discussed the use of AI scribe software for clinical note transcription with the patient, who gave verbal consent to proceed.    Since her last visit, she has experienced an increase in cough with mucus production. Initially, the cough was dry but later became productive, with mucus that she describes as 'sitting right here' in her chest. The mucus was initially green, leading her pulmonary doctor to prescribe antibiotics, which she completed. Although the color of the mucus lightened, the production persists.  She denies fevers or chills but experiences congestion, chest tightness, and shortness of breath. She uses Airsupra  three times daily, which helps open her airways. Additionally, she takes Singulair  at night and uses over-the-counter allergy  medication and nasal sprays, including Flonase  and azelastine , which help with congestion.  She has a history of allergies, including to erythromycin, and her liver doctor monitors her medications due to elevated liver enzymes. Previously, she frequently took antibiotics and prednisone  but has reduced their use to protect her liver.  She had an allergy  test previously, which showed an allergy  to  grass which may contribute to her symptoms.      2025 labs: "Your blood count, immunoglobulin levels were normal which is great. You also have good protection against diptheria and tetanus.   However, your pneumococcal titers were low."  Assessment and Plan: [name] is a 58 y.o. female with: Respiratory infection Suspected lung infection with productive cough and green phlegm. Previous antibiotics lightened phlegm but mucus persists. Allergic to erythromycin and doxycycline .  Start prednisone  taper. Prednisone  10mg  tablets - take 2 tablets for 4 days then 1 tablet on day 5.  Take Augmentin  875mg  twice a day for 10 days.  If you are not better let us  know.   Seasonal allergic rhinitis due to pollen Allergic rhinitis due to mold Past history - 2025 skin testing positive to grass, dust mites and mold. Interim history - stable.  Continue environmental control measures as below. Use over the counter antihistamines such as Zyrtec (cetirizine), Claritin  (loratadine ), Allegra (fexofenadine), or Xyzal (levocetirizine) daily as needed. May take twice a day during allergy  flares. May switch antihistamines every few months. Use azelastine  nasal spray 1-2 sprays per nostril twice a day as needed for runny nose/drainage. Use Flonase  (fluticasone ) nasal spray 1-2 sprays per nostril once a day as needed for nasal congestion.  Nasal saline spray (i.e., Simply Saline) or nasal saline lavage (i.e., NeilMed) is recommended as needed and prior to medicated nasal sprays. Consider allergy  injections for long term control if above medications do not help the symptoms.  Asthma must be in better control before starting.   Recurrent infections Past history - 2025 labs normal immunoglobulin levels, low pneumococcal titers. Keep track  of infections and antibiotics use. Get bloodwork to look at vaccine response in 1 month when not sick.   Moderate persistent asthma without complication Past history - Diagnosed by  pulmonologist with symptoms of wheezing and shortness of breath. Currently managed with Wixela inhaler. Normal alpha-1 antitrypsin level. 2025 CXR normal. 2025 spirometry was unremarkable.  Interim history - increased symptoms with current infection.  Consider biologics for asthma control. Ask your pulmonologist at your next visit.  Continue plan as per pulmonology. Daily controller medication(s): Continue Wixela 250mcg 1 puff twice a day and rinse mouth after each use.  Continue Singulair  (montelukast ) 10mg  daily at night. May use Airsupra  rescue inhaler 2 puffs every 4 to 6 hours as needed for shortness of breath, chest tightness, coughing, and wheezing. Do not use more than 12 puffs in 24 hours. May use Airsupra  rescue inhaler 2 puffs 5 to 15 minutes prior to strenuous physical activities. Rinse mouth after each use.  Monitor frequency of use - if you need to use it more than twice per week on a consistent basis let us  know.   Gastroesophageal reflux disease, unspecified whether esophagitis present Continue lifestyle and dietary modifications. Continue omeprazole  40mg  once day - nothing to eat or drink for 20-30 minutes afterwards.   Return in about 3 months (around 01/01/2024).  Meds ordered this encounter  Medications   amoxicillin -clavulanate (AUGMENTIN ) 875-125 MG tablet    Sig: Take 1 tablet by mouth 2 (two) times daily.    Dispense:  20 tablet    Refill:  0   predniSONE  (DELTASONE ) 10 MG tablet    Sig: Start prednisone  taper. Prednisone  10mg  tablets - take 2 tablets for 4 days then 1 tablet on day 5.    Dispense:  9 tablet    Refill:  0   Lab Orders         Strep pneumoniae 23 Serotypes IgG      Diagnostics: None.    Medication List:  Current Outpatient Medications  Medication Sig Dispense Refill   Albuterol -Budesonide (AIRSUPRA ) 90-80 MCG/ACT AERO Inhale 2 puffs into the lungs every 6 (six) hours as needed. 1 g 3   amoxicillin -clavulanate (AUGMENTIN ) 875-125 MG tablet  Take 1 tablet by mouth 2 (two) times daily. 20 tablet 0   aspirin  EC 81 MG tablet Take 81 mg by mouth daily.     Azelastine  HCl 137 MCG/SPRAY SOLN INSTILL 1-2 SPRAYS INTO BOTH NOSTRILS 2 (TWO) TIMES DAILY AS NEEDED (NASAL DRAINAGE). 30 mL 1   CALCIUM  MAGNESIUM ZINC PO Take 1 tablet by mouth daily in the afternoon.     Cholecalciferol (VITAMIN D3) 50 MCG (2000 UT) TABS      cloNIDine  (CATAPRES ) 0.1 MG tablet Take 1 tablet (0.1 mg total) by mouth 3 (three) times daily. 270 tablet 10   cyclobenzaprine  (FLEXERIL ) 5 MG tablet Take 1 tablet (5 mg total) by mouth 3 (three) times daily as needed for muscle spasms. 30 tablet 1   fluticasone  (FLONASE ) 50 MCG/ACT nasal spray Place 2 sprays into both nostrils daily. (Patient taking differently: Place 2 sprays into both nostrils daily as needed for allergies.) 16 g 6   fluticasone -salmeterol (WIXELA INHUB) 250-50 MCG/ACT AEPB Inhale 1 puff into the lungs in the morning and at bedtime. 60 each 5   gabapentin  (NEURONTIN ) 300 MG capsule Take 2 capsules (600 mg total) by mouth at bedtime. 180 capsule 1   ketotifen  (ZADITOR ) 0.025 % ophthalmic solution Place 2 drops into both eyes daily as needed (for irritation).  5 mL 0   losartan  (COZAAR ) 50 MG tablet Take 1 tablet (50 mg total) by mouth 2 (two) times daily. 180 tablet 2   meloxicam  (MOBIC ) 15 MG tablet Take 1 tablet (15 mg total) by mouth daily. 90 tablet 1   metoprolol  succinate (TOPROL -XL) 50 MG 24 hr tablet Take 1 tablet (50 mg total) by mouth daily. 90 tablet 3   montelukast  (SINGULAIR ) 10 MG tablet Take 1 tablet (10 mg total) by mouth at bedtime. 90 tablet 1   Multiple Vitamins-Minerals (ONE-A-DAY WOMENS 50+ ADVANTAGE PO) Take 1 tablet by mouth daily. Unknown strength     omeprazole  (PRILOSEC) 40 MG capsule Take 1 capsule (40 mg total) by mouth daily. 90 capsule 3   ondansetron  (ZOFRAN ) 4 MG tablet Take 1 tablet (4 mg total) by mouth every 8 (eight) hours as needed for nausea or vomiting. 20 tablet 0    predniSONE  (DELTASONE ) 10 MG tablet Start prednisone  taper. Prednisone  10mg  tablets - take 2 tablets for 4 days then 1 tablet on day 5. 9 tablet 0   promethazine -dextromethorphan (PROMETHAZINE -DM) 6.25-15 MG/5ML syrup Take 5 mLs by mouth 4 (four) times daily as needed. 180 mL 0   simvastatin  (ZOCOR ) 40 MG tablet Take 1 tablet (40 mg total) by mouth at bedtime. 90 tablet 0   spironolactone  (ALDACTONE ) 25 MG tablet TAKE 1/2 TABLET BY MOUTH EVERY DAY 45 tablet 3   No current facility-administered medications for this visit.   Allergies: Allergies  Allergen Reactions   Hydrochlorothiazide  Nausea And Vomiting    Other reaction(s): Other (See Comments) Leg Cramps Leg Cramps  Other reaction(s): Other (See Comments) Leg Cramps   Morphine Other (See Comments), Rash and Nausea And Vomiting    Hallucinating  Other reaction(s): Other (See Comments) Hallucinating Hallucinating Hallucinating Hallucinating  Hallucinating  Hallucinating  Other reaction(s): Other (See Comments) Hallucinating Hallucinating   Vancomycin  Nausea And Vomiting    Ringing in ears   Erythromycin Base Hives   Erythromycin Base Hives   Vancomycin  Hcl Other (See Comments)    Ringing in ears   Doxycycline  Nausea Only and Rash   I reviewed her past medical history, social history, family history, and environmental history and no significant changes have been reported from her previous visit.  Review of Systems  Constitutional:  Negative for appetite change, chills, fever and unexpected weight change.  HENT:  Positive for congestion, postnasal drip and rhinorrhea.   Eyes:  Negative for itching.  Respiratory:  Positive for cough, chest tightness and shortness of breath. Negative for wheezing.   Cardiovascular:  Negative for chest pain.  Gastrointestinal:  Negative for abdominal pain.  Genitourinary:  Negative for difficulty urinating.  Skin:  Negative for rash.  Neurological:  Positive for headaches.     Objective: BP 130/86 (BP Location: Right Arm, Patient Position: Sitting, Cuff Size: Large)   Pulse 81   Temp 98.2 F (36.8 C) (Temporal)   Resp 16   Ht 5\' 2"  (1.575 m)   Wt 276 lb 12.8 oz (125.6 kg)   SpO2 93%   BMI 50.63 kg/m  Body mass index is 50.63 kg/m. Physical Exam Vitals and nursing note reviewed.  Constitutional:      Appearance: Normal appearance. She is well-developed.  HENT:     Head: Normocephalic and atraumatic.     Right Ear: Tympanic membrane and external ear normal.     Left Ear: Tympanic membrane and external ear normal.     Nose: Nose normal.  Mouth/Throat:     Mouth: Mucous membranes are moist.     Pharynx: Oropharynx is clear.  Eyes:     Conjunctiva/sclera: Conjunctivae normal.  Cardiovascular:     Rate and Rhythm: Normal rate and regular rhythm.     Heart sounds: Normal heart sounds. No murmur heard.    No friction rub. No gallop.  Pulmonary:     Effort: Pulmonary effort is normal.     Breath sounds: Normal breath sounds. No wheezing, rhonchi or rales.  Musculoskeletal:     Cervical back: Neck supple.  Skin:    General: Skin is warm.     Findings: No rash.  Neurological:     Mental Status: She is alert and oriented to person, place, and time.  Psychiatric:        Behavior: Behavior normal.   Previous notes and tests were reviewed. The plan was reviewed with the patient/family, and all questions/concerned were addressed.  It was my pleasure to see Kryslyn today and participate in her care. Please feel free to contact me with any questions or concerns.  Sincerely,  Eudelia Hero, DO Allergy  & Immunology  [facility] [place]  [place] office: [phone] [facility] office: [phone]

## 2023-10-02 ENCOUNTER — Encounter: Payer: Self-pay | Admitting: Allergy

## 2023-10-06 ENCOUNTER — Ambulatory Visit (INDEPENDENT_AMBULATORY_CARE_PROVIDER_SITE_OTHER): Admitting: *Deleted

## 2023-10-06 DIAGNOSIS — Z0000 Encounter for general adult medical examination without abnormal findings: Secondary | ICD-10-CM

## 2023-10-06 NOTE — Progress Notes (Signed)
 Subjective:   Renee Flores is a 58 y.o. female who presents for Medicare Annual (Subsequent) preventive examination.  Visit Complete: Virtual I connected with  Renee Flores on 10/06/23 by a audio enabled telemedicine application and verified that I am speaking with the correct person using two identifiers.  Patient Location: Home  Provider Location: Home Office  I discussed the limitations of evaluation and management by telemedicine. The patient expressed understanding and agreed to proceed.  Vital Signs: Because this visit was a virtual/telehealth visit, some criteria may be missing or patient reported. Any vitals not documented were not able to be obtained and vitals that have been documented are patient reported.  Patient Medicare AWV questionnaire was completed by the patient on 10-05-2023; I have confirmed that all information answered by patient is correct and no changes since this date.  Cardiac Risk Factors include: advanced age (>15men, >43 women);hypertension;obesity (BMI >30kg/m2)     Objective:     There were no vitals filed for this visit. There is no height or weight on file to calculate BMI.     10/06/2023    3:57 PM 10/02/2022    1:05 PM 09/26/2021    2:08 PM 04/27/2021   10:22 PM 07/30/2020    9:10 AM 07/01/2012    3:41 PM  Advanced Directives  Does Patient Have a Medical Advance Directive? No No No No No Patient does not have advance directive;Patient would not like information  Does patient want to make changes to medical advance directive?     Yes (MAU/Ambulatory/Procedural Areas - Information given)   Would patient like information on creating a medical advance directive? No - Patient declined No - Patient declined No - Patient declined Yes (ED - Information included in AVS)      Current Medications (verified) Outpatient Encounter Medications as of 10/06/2023  Medication Sig   Albuterol -Budesonide (AIRSUPRA ) 90-80 MCG/ACT AERO Inhale 2 puffs into the  lungs every 6 (six) hours as needed.   amoxicillin -clavulanate (AUGMENTIN ) 875-125 MG tablet Take 1 tablet by mouth 2 (two) times daily.   aspirin  EC 81 MG tablet Take 81 mg by mouth daily.   Azelastine  HCl 137 MCG/SPRAY SOLN INSTILL 1-2 SPRAYS INTO BOTH NOSTRILS 2 (TWO) TIMES DAILY AS NEEDED (NASAL DRAINAGE).   CALCIUM  MAGNESIUM ZINC PO Take 1 tablet by mouth daily in the afternoon.   Cholecalciferol (VITAMIN D3) 50 MCG (2000 UT) TABS    cloNIDine  (CATAPRES ) 0.1 MG tablet Take 1 tablet (0.1 mg total) by mouth 3 (three) times daily.   cyclobenzaprine  (FLEXERIL ) 5 MG tablet Take 1 tablet (5 mg total) by mouth 3 (three) times daily as needed for muscle spasms.   fluticasone  (FLONASE ) 50 MCG/ACT nasal spray Place 2 sprays into both nostrils daily. (Patient taking differently: Place 2 sprays into both nostrils daily as needed for allergies.)   fluticasone -salmeterol (WIXELA INHUB) 250-50 MCG/ACT AEPB Inhale 1 puff into the lungs in the morning and at bedtime.   gabapentin  (NEURONTIN ) 300 MG capsule Take 2 capsules (600 mg total) by mouth at bedtime.   ketotifen  (ZADITOR ) 0.025 % ophthalmic solution Place 2 drops into both eyes daily as needed (for irritation).   losartan  (COZAAR ) 50 MG tablet Take 1 tablet (50 mg total) by mouth 2 (two) times daily.   meloxicam  (MOBIC ) 15 MG tablet Take 1 tablet (15 mg total) by mouth daily.   metoprolol  succinate (TOPROL -XL) 50 MG 24 hr tablet Take 1 tablet (50 mg total) by mouth daily.  montelukast  (SINGULAIR ) 10 MG tablet Take 1 tablet (10 mg total) by mouth at bedtime.   Multiple Vitamins-Minerals (ONE-A-DAY WOMENS 50+ ADVANTAGE PO) Take 1 tablet by mouth daily. Unknown strength   omeprazole  (PRILOSEC) 40 MG capsule Take 1 capsule (40 mg total) by mouth daily.   ondansetron  (ZOFRAN ) 4 MG tablet Take 1 tablet (4 mg total) by mouth every 8 (eight) hours as needed for nausea or vomiting.   predniSONE  (DELTASONE ) 10 MG tablet Start prednisone  taper. Prednisone  10mg   tablets - take 2 tablets for 4 days then 1 tablet on day 5.   promethazine -dextromethorphan (PROMETHAZINE -DM) 6.25-15 MG/5ML syrup Take 5 mLs by mouth 4 (four) times daily as needed.   simvastatin  (ZOCOR ) 40 MG tablet Take 1 tablet (40 mg total) by mouth at bedtime.   spironolactone  (ALDACTONE ) 25 MG tablet TAKE 1/2 TABLET BY MOUTH EVERY DAY   No facility-administered encounter medications on file as of 10/06/2023.    Allergies (verified) Hydrochlorothiazide , Morphine, Vancomycin , Erythromycin base, Erythromycin base, Vancomycin  hcl, and Doxycycline    History: Past Medical History:  Diagnosis Date   Allergy     Arthritis    Asthma    Bulging lumbar disc    L4-5   Complication of anesthesia    anxiety attack after waking up from anesthesia   Factor 5 Leiden mutation, heterozygous (HCC)    Factor V deficiency, congenital (HCC) 03/23/2012   GERD (gastroesophageal reflux disease)    Heart murmur    Heart palpitations    Heart valve problem    mild- moderate mitral valve leakage    High cholesterol    HYPERCHOLESTEROLEMIA 02/27/2010   Qualifier: Diagnosis of  By: Leroy Ranks RN, Denise     Hypersomnia with sleep apnea, unspecified 11/03/2013   Hypertension    Lumbar radiculopathy 08/12/2018   MS (multiple sclerosis) (HCC)    questionable   Neuromuscular disorder (HCC)    OSA on CPAP 03/25/2017   Osteopenia 04/2016   T score -1.9   Osteoporosis    Preop cardiovascular exam 03/23/2012   Retrognathia 11/03/2013   Right knee DJD 07/08/2012   Severe obesity (BMI >= 40) (HCC) 11/03/2013   Shortness of breath 06/03/2013   Sleep apnea    Vitamin D  deficiency 2014   17   Past Surgical History:  Procedure Laterality Date   ABDOMINAL HYSTERECTOMY     ANKLE FUSION Left 04/25/2021   APPENDECTOMY     arthroscopic knee surgery     CESAREAN SECTION     x2   chloecystectomy     CHOLECYSTECTOMY     ESOPHAGEAL DILATION  04/09/2012   FRACTURE SURGERY  2007   Knee cap   JOINT  REPLACEMENT  2012/2014   Both knee's   KNEE LIGAMENT RECONSTRUCTION     laser throat polyps     Left ankle reconstructed     NECK LESION BIOPSY     PATELLA FRACTURE SURGERY     PELVIC LAPAROSCOPY     LSO   REPLACEMENT TOTAL KNEE Left 2012   REPLACEMENT TOTAL KNEE Right 2014   TOE SURGERY     TONSILLECTOMY     TONSILLECTOMY  1979   TOTAL ABDOMINAL HYSTERECTOMY W/ BILATERAL SALPINGOOPHORECTOMY  2000   TOTAL KNEE ARTHROPLASTY Left 2012   TOTAL KNEE ARTHROPLASTY Right 07/08/2012   Procedure: TOTAL KNEE ARTHROPLASTY with revision components ;  Surgeon: Alphonzo Ask, MD;  Location: [facility] OR;  Service: Orthopedics;  Laterality: Right;  PATIENT HAS HX. OF FACTOR IV DISORFER  TUBAL LIGATION     Family History  Problem Relation Age of Onset   Heart disease Father    Hypertension Father    Hyperlipidemia Father    Cancer Father    Other Father        Respiratory problems/Factor 5 disorder/1 bypass, 2 valve replacement (mitral and aortic)    Arthritis Father    Diabetes Father    Hearing loss Father    Obesity Father    Hypertension Mother    Diabetes Mother    Hyperlipidemia Mother    Arthritis Mother    Heart disease Mother    Obesity Mother    Varicose Veins Mother    Hypertension Sister    Colon cancer Paternal Aunt    Breast cancer Paternal Aunt 9   Cancer Paternal Aunt    Heart disease Maternal Grandfather    Lymphoma Maternal Grandfather    Heart failure Maternal Grandfather    Cancer Maternal Grandfather    Hearing loss Maternal Grandfather    Colon cancer Paternal Grandmother    Arthritis Paternal Grandmother    Kidney disease Paternal Grandmother    Obesity Paternal Grandmother    Hyperlipidemia Brother    Hypertension Brother    Diabetes Brother    Obesity Brother    Hypertension Brother    Obesity Brother    Colon cancer Paternal Aunt    Breast cancer Paternal Aunt 58   Stroke Paternal Grandfather    Arthritis Paternal Grandfather    Cancer Maternal  Grandmother    Miscarriages / Stillbirths Maternal Grandmother    ADD / ADHD Son    Arthritis Son    Birth defects Son    Hearing loss Son    ADD / ADHD Son    Anxiety disorder Son    Depression Son    Arthritis Paternal Aunt    Diabetes Maternal Uncle    Obesity Maternal Uncle    Diabetes Maternal Uncle    Social History   Socioeconomic History   Marital status: Widowed    Spouse name: [name]   Number of children: 2   Years of education: 12   Highest education level: 12th grade  Occupational History    Employer: [employer]  Tobacco Use   Smoking status: Never    Passive exposure: Never   Smokeless tobacco: Never  Vaping Use   Vaping status: Never Used  Substance and Sexual Activity   Alcohol use: Not Currently   Drug use: No   Sexual activity: Not Currently    Birth control/protection: Surgical    Comment: HYST-1st intercourse 58 yo-Fewer than 5 partners  Other Topics Concern   Not on file  Social History Narrative   [name]  and lives at home with her husband.   Patient has two children.   Patient works as a Product/process development scientist for [employer].   Patient drinks one cup of caffeine daily.   Patient is right-handed.   Patient has a high school education.   Social Drivers of Corporate investment banker Strain: Low Risk  (10/06/2023)   Overall Financial Resource Strain (CARDIA)    Difficulty of Paying Living Expenses: Not hard at all  Food Insecurity: No Food Insecurity (10/06/2023)   Hunger Vital Sign    Worried About Running Out of Food in the Last Year: Never true    Ran Out of Food in the Last Year: Never true  Transportation Needs: No Transportation Needs (10/06/2023)  PRAPARE - Administrator, Civil Service (Medical): No    Lack of Transportation (Non-Medical): No  Physical Activity: Sufficiently Active (10/06/2023)   Exercise Vital Sign    Days of Exercise per Week: 3 days    Minutes of Exercise per Session: 90 min   Stress: No Stress Concern Present (10/06/2023)   [employer] of Occupational Health - Occupational Stress Questionnaire    Feeling of Stress : Not at all  Social Connections: Moderately Integrated (10/06/2023)   Social Connection and Isolation Panel [NHANES]    Frequency of Communication with Friends and Family: More than three times a week    Frequency of Social Gatherings with Friends and Family: More than three times a week    Attends Religious Services: More than 4 times per year    Active Member of [employer] or Organizations: Yes    Attends Banker Meetings: More than 4 times per year    Marital Status: Widowed    Tobacco Counseling Counseling given: Not Answered   Clinical Intake:  Pre-visit preparation completed: Yes  Pain : No/denies pain     Diabetes: No  How often do you need to have someone help you when you read instructions, pamphlets, or other written materials from your doctor or pharmacy?: 1 - Never  Interpreter Needed?: No  Information entered by :: Kieth Pelt LPN   Activities of Daily Living    10/06/2023    3:58 PM 10/05/2023    3:49 PM  In your present state of health, do you have any difficulty performing the following activities:  Hearing? 0 0  Vision? 0 0  Difficulty concentrating or making decisions? 0 0  Walking or climbing stairs? 0 0  Dressing or bathing? 0 0  Doing errands, shopping? 0 0  Preparing Food and eating ? N N  Using the Toilet? N N  In the past six months, have you accidently leaked urine? N N  Do you have problems with loss of bowel control? N N  Managing your Medications? N N  Managing your Finances? N N  Housekeeping or managing your Housekeeping? N N    Patient Care Team: Jess Morita, MD as PCP - General (Family Medicine) Manfred Seed, MD as PCP - Cardiology (Cardiology) Dohmeier, Raoul Byes, MD as Consulting Physician (Neurology) Celia Coles Angus Kenning, DPM as Consulting Physician  (Podiatry) Donnamarie Gables, MD as Consulting Physician (Orthopedic Surgery) Myrle Aspen, [facility] (Inactive) as Pharmacist (Pharmacist)  Indicate any recent Medical Services you may have received from other than [facility] providers in the past year (date may be approximate).     Assessment:    This is a routine wellness examination for Dajae.  Hearing/Vision screen Hearing Screening - Comments:: Some hearing loss No hearing aids Vision Screening - Comments:: [facility] Up to date   Goals Addressed             This Visit's Progress    Weight (lb) < 200 lb (90.7 kg)         Depression Screen    10/06/2023    3:58 PM 09/22/2023    9:04 AM 06/30/2023   11:19 AM 03/20/2023    8:18 AM 12/03/2022    9:34 AM 10/02/2022    1:10 PM 09/16/2022    8:16 AM  PHQ 2/9 Scores  PHQ - 2 Score 0 1 0 0 0 0 0  PHQ- 9 Score 3 7  5  0 4 2    Fall  Risk    10/06/2023    3:54 PM 10/05/2023    3:49 PM 09/22/2023    9:04 AM 08/17/2023   11:31 AM 06/30/2023   11:19 AM  Fall Risk   Falls in the past year? 0 0 0 0 0  Number falls in past yr: 0 0 0  0  Injury with Fall? 0 0 0  0  Risk for fall due to :   No Fall Risks  No Fall Risks  Follow up Falls evaluation completed;Education provided;Falls prevention discussed  Falls evaluation completed      MEDICARE RISK AT HOME: Medicare Risk at Home Any stairs in or around the home?: Yes If so, are there any without handrails?: No Home free of loose throw rugs in walkways, pet beds, electrical cords, etc?: Yes Adequate lighting in your home to reduce risk of falls?: Yes Life alert?: No Use of a cane, walker or w/c?: No Grab bars in the bathroom?: Yes Shower chair or bench in shower?: Yes Elevated toilet seat or a handicapped toilet?: Yes  TIMED UP AND GO:  Was the test performed?  No    Cognitive Function:        10/06/2023    3:57 PM 10/02/2022    1:06 PM  6CIT Screen  What Year? 0 points 0 points  What month? 0 points 0 points  What time? 0  points 0 points  Count back from 20 0 points 0 points  Months in reverse 0 points 0 points  Repeat phrase 0 points 0 points  Total Score 0 points 0 points    Immunizations Immunization History  Administered Date(s) Administered   Influenza, Seasonal, Injecte, Preservative Fre 03/20/2023   Moderna Sars-Covid-2 Vaccination 08/05/2019, 09/01/2019, 04/05/2020   Tdap 04/28/2006, 09/12/2021    TDAP status: Up to date  Flu Vaccine status: Up to date  Pneumococcal vaccine status: Up to date  Covid-19 vaccine status: Information provided on how to obtain vaccines.   Qualifies for Shingles Vaccine? Yes   Zostavax completed No   Shingrix Completed?: No.    Education has been provided regarding the importance of this vaccine. Patient has been advised to call insurance company to determine out of pocket expense if they have not yet received this vaccine. Advised may also receive vaccine at local pharmacy or Health Dept. Verbalized acceptance and understanding.  Screening Tests Health Maintenance  Topic Date Due   Zoster Vaccines- Shingrix (1 of 2) Never done   COVID-19 Vaccine (4 - 2024-25 season) 12/28/2022   MAMMOGRAM  09/15/2023   Pneumococcal Vaccine 4-80 Years old (1 of 2 - PCV) 06/29/2024 (Originally 11/09/1984)   INFLUENZA VACCINE  11/27/2023   Diabetic kidney evaluation - eGFR measurement  09/21/2024   Diabetic kidney evaluation - Urine ACR  09/21/2024   Medicare Annual Wellness (AWV)  10/05/2024   Colonoscopy  08/04/2029   DTaP/Tdap/Td (3 - Td or Tdap) 09/13/2031   Hepatitis C Screening  Completed   HIV Screening  Completed   HPV VACCINES  Aged Out   Meningococcal B Vaccine  Aged Out    Health Maintenance  Health Maintenance Due  Topic Date Due   Zoster Vaccines- Shingrix (1 of 2) Never done   COVID-19 Vaccine (4 - 2024-25 season) 12/28/2022   MAMMOGRAM  09/15/2023    Colorectal cancer screening: Type of screening: Colonoscopy. Completed 2021. Repeat every 10  years  Mammogram status: Ordered  . Pt provided with contact info and advised to call to schedule  appt.   Bone Density status: Completed 2024. Results reflect: Bone density results: OSTEOPOROSIS. Repeat every 2 years.  Lung Cancer Screening: (Low Dose CT Chest recommended if Age 57-80 years, 20 pack-year currently smoking OR have quit w/in 15years.) does not qualify.   Lung Cancer Screening Referral:   Additional Screening:  Hepatitis C Screening: does not qualify; Completed 2022  Vision Screening: Recommended annual ophthalmology exams for early detection of glaucoma and other disorders of the eye. Is the patient up to date with their annual eye exam?  Yes  Who is the provider or what is the name of the office in which the patient attends annual eye exams? [facility] If pt is not established with a provider, would they like to be referred to a provider to establish care? No .   Dental Screening: Recommended annual dental exams for proper oral hygiene    Community Resource Referral / Chronic Care Management: CRR required this visit?  No   CCM required this visit?  No     Plan:     I have personally reviewed and noted the following in the patient's chart:   Medical and social history Use of alcohol, tobacco or illicit drugs  Current medications and supplements including opioid prescriptions. Patient is not currently taking opioid prescriptions. Functional ability and status Nutritional status Physical activity Advanced directives List of other physicians Hospitalizations, surgeries, and ER visits in previous 12 months Vitals Screenings to include cognitive, depression, and falls Referrals and appointments  In addition, I have reviewed and discussed with patient certain preventive protocols, quality metrics, and best practice recommendations. A written personalized care plan for preventive services as well as general preventive health recommendations were provided to  patient.     Kieth Pelt, LPN   [id number]/61/0960   After Visit Summary: (MyChart) Due to this being a telephonic visit, the after visit summary with patients personalized plan was offered to patient via MyChart   Nurse Notes:

## 2023-10-06 NOTE — Patient Instructions (Signed)
 Renee Flores , Thank you for taking time to come for your Medicare Wellness Visit. I appreciate your ongoing commitment to your health goals. Please review the following plan we discussed and let me know if I can assist you in the future.   Screening recommendations/referrals: Colonoscopy: up to date Mammogram: due Bone Density: up to date Recommended yearly ophthalmology/optometry visit for glaucoma screening and checkup Recommended yearly dental visit for hygiene and checkup  Vaccinations: Influenza vaccine: up to date Pneumococcal vaccine:  Tdap vaccine: up to date      Preventive Care 65 Years and Older, Female Preventive care refers to lifestyle choices and visits with your health care provider that can promote health and wellness. What does preventive care include? A yearly physical exam. This is also called an annual well check. Dental exams once or twice a year. Routine eye exams. Ask your health care provider how often you should have your eyes checked. Personal lifestyle choices, including: Daily care of your teeth and gums. Regular physical activity. Eating a healthy diet. Avoiding tobacco and drug use. Limiting alcohol use. Practicing safe sex. Taking low-dose aspirin  every day. Taking vitamin and mineral supplements as recommended by your health care provider. What happens during an annual well check? The services and screenings done by your health care provider during your annual well check will depend on your age, overall health, lifestyle risk factors, and family history of disease. Counseling  Your health care provider may ask you questions about your: Alcohol use. Tobacco use. Drug use. Emotional well-being. Home and relationship well-being. Sexual activity. Eating habits. History of falls. Memory and ability to understand (cognition). Work and work Astronomer. Reproductive health. Screening  You may have the following tests or measurements: Height,  weight, and BMI. Blood pressure. Lipid and cholesterol levels. These may be checked every 5 years, or more frequently if you are over 65 years old. Skin check. Lung cancer screening. You may have this screening every year starting at age 46 if you have a 30-pack-year history of smoking and currently smoke or have quit within the past 15 years. Fecal occult blood test (FOBT) of the stool. You may have this test every year starting at age 54. Flexible sigmoidoscopy or colonoscopy. You may have a sigmoidoscopy every 5 years or a colonoscopy every 10 years starting at age 89. Hepatitis C blood test. Hepatitis B blood test. Sexually transmitted disease (STD) testing. Diabetes screening. This is done by checking your blood sugar (glucose) after you have not eaten for a while (fasting). You may have this done every 1-3 years. Bone density scan. This is done to screen for osteoporosis. You may have this done starting at age 84. Mammogram. This may be done every 1-2 years. Talk to your health care provider about how often you should have regular mammograms. Talk with your health care provider about your test results, treatment options, and if necessary, the need for more tests. Vaccines  Your health care provider may recommend certain vaccines, such as: Influenza vaccine. This is recommended every year. Tetanus, diphtheria, and acellular pertussis (Tdap, Td) vaccine. You may need a Td booster every 10 years. Zoster vaccine. You may need this after age 69. Pneumococcal 13-valent conjugate (PCV13) vaccine. One dose is recommended after age 35. Pneumococcal polysaccharide (PPSV23) vaccine. One dose is recommended after age 70. Talk to your health care provider about which screenings and vaccines you need and how often you need them. This information is not intended to replace advice given to  you by your health care provider. Make sure you discuss any questions you have with your health care  provider. Document Released: 05/11/2015 Document Revised: 01/02/2016 Document Reviewed: 02/13/2015 Elsevier Interactive Patient Education  2017 [employer].  Fall Prevention in the Home Falls can cause injuries. They can happen to people of all ages. There are many things you can do to make your home safe and to help prevent falls. What can I do on the outside of my home? Regularly fix the edges of walkways and driveways and fix any cracks. Remove anything that might make you trip as you walk through a door, such as a raised step or threshold. Trim any bushes or trees on the path to your home. Use bright outdoor lighting. Clear any walking paths of anything that might make someone trip, such as rocks or tools. Regularly check to see if handrails are loose or broken. Make sure that both sides of any steps have handrails. Any raised decks and porches should have guardrails on the edges. Have any leaves, snow, or ice cleared regularly. Use sand or salt on walking paths during winter. Clean up any spills in your garage right away. This includes oil or grease spills. What can I do in the bathroom? Use night lights. Install grab bars by the toilet and in the tub and shower. Do not use towel bars as grab bars. Use non-skid mats or decals in the tub or shower. If you need to sit down in the shower, use a plastic, non-slip stool. Keep the floor dry. Clean up any water that spills on the floor as soon as it happens. Remove soap buildup in the tub or shower regularly. Attach bath mats securely with double-sided non-slip rug tape. Do not have throw rugs and other things on the floor that can make you trip. What can I do in the bedroom? Use night lights. Make sure that you have a light by your bed that is easy to reach. Do not use any sheets or blankets that are too big for your bed. They should not hang down onto the floor. Have a firm chair that has side arms. You can use this for support while  you get dressed. Do not have throw rugs and other things on the floor that can make you trip. What can I do in the kitchen? Clean up any spills right away. Avoid walking on wet floors. Keep items that you use a lot in easy-to-reach places. If you need to reach something above you, use a strong step stool that has a grab bar. Keep electrical cords out of the way. Do not use floor polish or wax that makes floors slippery. If you must use wax, use non-skid floor wax. Do not have throw rugs and other things on the floor that can make you trip. What can I do with my stairs? Do not leave any items on the stairs. Make sure that there are handrails on both sides of the stairs and use them. Fix handrails that are broken or loose. Make sure that handrails are as long as the stairways. Check any carpeting to make sure that it is firmly attached to the stairs. Fix any carpet that is loose or worn. Avoid having throw rugs at the top or bottom of the stairs. If you do have throw rugs, attach them to the floor with carpet tape. Make sure that you have a light switch at the top of the stairs and the bottom of the stairs.  If you do not have them, ask someone to add them for you. What else can I do to help prevent falls? Wear shoes that: Do not have high heels. Have rubber bottoms. Are comfortable and fit you well. Are closed at the toe. Do not wear sandals. If you use a stepladder: Make sure that it is fully opened. Do not climb a closed stepladder. Make sure that both sides of the stepladder are locked into place. Ask someone to hold it for you, if possible. Clearly mark and make sure that you can see: Any grab bars or handrails. First and last steps. Where the edge of each step is. Use tools that help you move around (mobility aids) if they are needed. These include: Canes. Walkers. Scooters. Crutches. Turn on the lights when you go into a dark area. Replace any light bulbs as soon as they burn  out. Set up your furniture so you have a clear path. Avoid moving your furniture around. If any of your floors are uneven, fix them. If there are any pets around you, be aware of where they are. Review your medicines with your doctor. Some medicines can make you feel dizzy. This can increase your chance of falling. Ask your doctor what other things that you can do to help prevent falls. This information is not intended to replace advice given to you by your health care provider. Make sure you discuss any questions you have with your health care provider. Document Released: 02/08/2009 Document Revised: 09/20/2015 Document Reviewed: 05/19/2014 Elsevier Interactive Patient Education  2017 [employer].

## 2023-10-23 ENCOUNTER — Other Ambulatory Visit: Payer: Self-pay | Admitting: Allergy

## 2023-10-26 ENCOUNTER — Other Ambulatory Visit: Payer: Self-pay | Admitting: Family Medicine

## 2023-10-26 DIAGNOSIS — Z1231 Encounter for screening mammogram for malignant neoplasm of breast: Secondary | ICD-10-CM

## 2023-10-27 ENCOUNTER — Telehealth: Payer: Self-pay | Admitting: Family Medicine

## 2023-10-27 DIAGNOSIS — E1169 Type 2 diabetes mellitus with other specified complication: Secondary | ICD-10-CM

## 2023-10-27 NOTE — Telephone Encounter (Unsigned)
 Copied from CRM [id number]. Topic: Clinical - Medication Refill >> Oct 27, 2023  4:01 PM [place] J wrote: Medication: simvastatin  (ZOCOR ) 40 MG tablet  Has the patient contacted their pharmacy? Yes (Agent: If no, request that the patient contact the pharmacy for the refill. If patient does not wish to contact the pharmacy document the reason why and proceed with request.) (Agent: If yes, when and what did the pharmacy advise?)  This is the patient's preferred pharmacy:  [facility]) [place]  - [place], [place] - [address] [facility] [address] [place] [address] Phone: [phone] Fax: [phone]  Is this the correct pharmacy for this prescription? Yes If no, delete pharmacy and type the correct one.  Has the prescription been filled recently? No  Is the patient out of the medication? No  Has the patient been seen for an appointment in the last year OR does the patient have an upcoming appointment? Yes  Can we respond through MyChart? Yes  Agent: Please be advised that Rx refills may take up to 3 business days. We ask that you follow-up with your pharmacy.

## 2023-10-28 MED ORDER — SIMVASTATIN 40 MG PO TABS: 40.0000 mg | ORAL_TABLET | Freq: Every day | ORAL | 0 refills | Status: DC

## 2023-11-09 ENCOUNTER — Ambulatory Visit

## 2023-11-10 ENCOUNTER — Ambulatory Visit (HOSPITAL_BASED_OUTPATIENT_CLINIC_OR_DEPARTMENT_OTHER): Admitting: Cardiology

## 2023-11-10 ENCOUNTER — Encounter (HOSPITAL_BASED_OUTPATIENT_CLINIC_OR_DEPARTMENT_OTHER): Payer: Self-pay | Admitting: Cardiology

## 2023-11-10 VITALS — BP 135/87 | HR 78 | Ht 62.0 in | Wt 277.0 lb

## 2023-11-10 DIAGNOSIS — K760 Fatty (change of) liver, not elsewhere classified: Secondary | ICD-10-CM

## 2023-11-10 DIAGNOSIS — G4733 Obstructive sleep apnea (adult) (pediatric): Secondary | ICD-10-CM

## 2023-11-10 DIAGNOSIS — Z7189 Other specified counseling: Secondary | ICD-10-CM | POA: Diagnosis not present

## 2023-11-10 DIAGNOSIS — I10 Essential (primary) hypertension: Secondary | ICD-10-CM

## 2023-11-10 MED ORDER — CARVEDILOL 12.5 MG PO TABS: 12.5000 mg | ORAL_TABLET | Freq: Two times a day (BID) | ORAL | 3 refills | Status: AC

## 2023-11-10 NOTE — Patient Instructions (Signed)
 Medication Instructions:    Stop metoprolol  Start carvedilol  12.5 mg twice a day. Watch for wheezing   *If you need a refill on your cardiac medications before your next appointment, please call your pharmacy*  Lab Work:  None ordered  If you have labs (blood work) drawn today and your tests are completely normal, you will receive your results only by: MyChart Message (if you have MyChart) OR A paper copy in the mail If you have any lab test that is abnormal or we need to change your treatment, we will call you to review the results.  Testing/Procedures:  None ordered.  Follow-Up: At [facility], you and your health needs are our priority.  As part of our continuing mission to provide you with exceptional heart care, our providers are all part of one team.  This team includes your primary Cardiologist (physician) and Advanced Practice Providers or APPs (Physician Assistants and Nurse Practitioners) who all work together to provide you with the care you need, when you need it.  Your next appointment:   6 week(s)  Provider:   Rosaline Bane, NP    We recommend signing up for the patient portal called MyChart.  Sign up information is provided on this After Visit Summary.  MyChart is used to connect with patients for Virtual Visits (Telemedicine).  Patients are able to view lab/test results, encounter notes, upcoming appointments, etc.  Non-urgent messages can be sent to your provider as well.   To learn more about what you can do with MyChart, go to [email or web address].   Other Instructions   Once you are on this for a few days, try dropping the clonidine  to just twice a day (morning and night). See how your blood pressure does At the follow up, we will see if we can further adjust the medication to try to get you off of the clonidine  slowly over time

## 2023-11-10 NOTE — Progress Notes (Signed)
 Cardiology Office Note:  .   Date:  11/10/2023  ID:  Renee Flores, DOB January 24, 1966, MRN [id number] PCP: Mahlon Comer BRAVO, MD  [facility] Providers Cardiologist:  Lamar Fitch, MD {  History of Present Illness: .   Renee Flores is a 58 y.o. female with PMH hypertension, dyslipidemia, palpitations, atypical chest pain. She was previously followed by Dr. Fitch and established care with me on 11/10/23.  Pertinent CV history: Myoview  2022, monitor from 2022, echo from 2021 personally reviewed.   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 Her asthma is much worse in the summer, so avoids exercising outside this time of year. Wants to work on weight loss.  Reviewed her medications, she is asking to cut back. Tried off clonidine  in the past (though doesn't sound like it was tapered) and her BP shot to over 200 systolic. Currently on losartan  50 mg BID, clonidine  0.1 mg TID, metoprolol  succinate 50 mg daily, spironolactone  25 mg daily. Blood pressure has been well controlled on this regimen. Palpitations also well controlled. Discussed carvedilol , though need to be cautious with her asthma. No wheezing.   Notes history of fatty liver. Has been on statin for a long time. Discussed future meds for this. Husband died of liver failure.  ROS: Denies chest pain, shortness of breath at rest or with normal exertion. No PND, orthopnea, LE edema or unexpected weight gain. No syncope. ROS otherwise negative except as noted.   Studies Reviewed: SABRA    EKG:  EKG Interpretation Date/Time:  Tuesday November 10 2023 09:56:17 EDT Ventricular Rate:  78 PR Interval:  136 QRS Duration:  88 QT Interval:  396 QTC Calculation: 451 R Axis:   27  Text Interpretation: Normal sinus rhythm Normal ECG When compared with ECG of 01-Jun-2013 08:53, No significant change was found Confirmed by Lonni Slain [phone]) on 11/10/2023 10:25:05 AM     Physical Exam:   VS:  BP 135/87 (BP Location: Left Arm, Patient Position: Sitting, Cuff Size: Large)   Pulse 78   Ht 5' 2 (1.575 m)   Wt 277 lb (125.6 kg)   SpO2 93%   BMI 50.66 kg/m    Wt Readings from Last 3 Encounters:  11/10/23 277 lb (125.6 kg)  10/01/23 276 lb 12.8 oz (125.6 kg)  09/22/23 276 lb (125.2 kg)    GEN: Well nourished, well developed in no acute distress HEENT: Normal, moist mucous membranes NECK: No JVD CARDIAC: regular rhythm, normal S1 and S2, no rubs or gallops. No murmur. VASCULAR: Radial and DP pulses 2+ bilaterally. No carotid bruits RESPIRATORY:  Clear to auscultation without rales, wheezing or rhonchi  ABDOMEN: Soft, non-tender, non-distended MUSCULOSKELETAL:  Ambulates independently SKIN: Warm and dry, no edema NEUROLOGIC:  Alert and oriented x 3. No focal neuro deficits noted. PSYCHIATRIC:  Normal affect    ASSESSMENT AND PLAN: .    Hypertension -will work to adjust/titrate medications. Will start with changing metoprolol  to carvedilol  for better BP effect. Then if BP well controlled, would try to gradually wean clonidine . Could increase ARB or change to stronger ARB if additional blood pressure control needed to get off of clonidine . Continue spironolactone  -notes history of swelling on amlodipine, nausea/vomiting/muscle cramps on thiazide  Obesity Fatty liver disease OSA: on CPAP -on aspirin  and statin -would be a good candidate for GLP for metabolic syndrome or sleep apnea if covered in the future by her insurance (not  currently covered) -last A1c 6.2  CV risk counseling and prevention -recommend heart healthy/Mediterranean diet, with whole grains, fruits, vegetable, fish, lean meats, nuts, and olive oil. Limit salt. -recommend moderate walking, 3-5 times/week for 30-50 minutes each session. Aim for at least 150 minutes.week. Goal should be pace of 3 miles/hours, or walking 1.5 miles in 30 minutes -recommend avoidance of tobacco products.  Avoid excess alcohol. -ASCVD risk score: The 10-year ASCVD risk score (Arnett DK, et al., 2019) is: 2.6%   Values used to calculate the score:     Age: 89 years     Clincally relevant sex: Female     Is Non-Hispanic African American: No     Diabetic: No     Tobacco smoker: No     Systolic Blood Pressure: 135 mmHg     Is BP treated: Yes     HDL Cholesterol: 65.3 mg/dL     Total Cholesterol: 161 mg/dL    Dispo: 4-6 weeks with APP, 2-3 months with me  Signed, Shelda Bruckner, MD   Shelda Bruckner, MD, PhD, [facility]  [facility]  [place]  Heart & Vascular at [facility] at [facility] [address] [place], [place] [address] [phone]

## 2023-11-11 ENCOUNTER — Ambulatory Visit
Admission: RE | Admit: 2023-11-11 | Discharge: 2023-11-11 | Disposition: A | Discharge status: Home / Self Care | Source: Ambulatory Visit | Attending: Family Medicine | Admitting: Family Medicine

## 2023-11-11 DIAGNOSIS — Z1231 Encounter for screening mammogram for malignant neoplasm of breast: Secondary | ICD-10-CM

## 2023-11-19 ENCOUNTER — Other Ambulatory Visit: Payer: Self-pay | Admitting: Adult Health

## 2023-11-23 ENCOUNTER — Other Ambulatory Visit: Payer: Self-pay

## 2023-11-23 ENCOUNTER — Encounter (HOSPITAL_BASED_OUTPATIENT_CLINIC_OR_DEPARTMENT_OTHER): Payer: Self-pay | Admitting: Cardiology

## 2023-11-23 MED ORDER — LOSARTAN POTASSIUM 50 MG PO TABS
50.0000 mg | ORAL_TABLET | Freq: Two times a day (BID) | ORAL | 1 refills | Status: DC
Start: 2023-11-23 — End: 2023-12-29

## 2023-12-17 ENCOUNTER — Encounter: Admitting: Pulmonary Disease

## 2023-12-21 NOTE — Progress Notes (Signed)
 " Cardiology Office Note   Date:  12/30/2023  ID:  Renee Flores, DOB 10-20-1965, MRN [id number] PCP: Mahlon Comer BRAVO, MD  [facility] Providers Cardiologist:  Lamar Fitch, MD     [facility] Hypertension Dyslipidemia Atypical chest pain Palpitations Asthma Fatty liver Obesity OSA on CPAP  Seen by Dr. Fitch with negative myoview  and unremarkable cardiac monitor  09/2020, and echo 04/26/2020 with normal LVEF 60-65%, normal RV, no significant valve disease.  She established care with Dr. Lonni on 11/10/2023. Admitted she was not exercising regularly due to caregiver responsibilities for her family. Asthma worse in the summer and prevents her from exercising outdoors in the heat. Wants to work on weight loss. Currently on losartan  50 mg BID, clonidine  0.1 mg TID, spironolactone  25 mg daily, and metoprolol  succinate 50 mg daily. Tried to stop clonidine  and SBP > 200 mmHg, concern that it wasn't titrated. History of fatty liver, has been on statin therapy for a long time. Husband died of liver failure. She was advised to stop metoprolol  and start carvedilol  for better BP control. Plan to wean off clonidine  and consider more potent ARB. Notes history of swelling on amlodipine and muscle cramps, n/v on thiazide. Recommendation to consider GLP1-agonist for weight loss benefit.   History of Present Illness  Discussed the use of AI scribe software for clinical note transcription with the patient, who gave verbal consent to proceed.  History of Present Illness Renee Flores is a pleasant 58 year old female who is here today for management of hypertension. She is accompanied by her mother. Home blood pressure readings are in the 130s/90s range, higher than previous levels. She experiences nausea, which she attributes to the addition of carvedilol . Her antihypertensive regimen includes clonidine , losartan , metoprolol , spironolactone , and carvedilol . She would like to reduce pill  burden. Staph infection in 2010-2011 coincides with the onset of her hypertension. Family history of valve disease in her mother who underwent TAVR 2 years ago.  Her father had mitral valve disease and had open heart surgery.  Echocardiogram in 2021 showed trivial mitral valve leakiness and a normal aortic valve. She experiences occasional palpitations but denies significant chest pain, lightheadedness, shortness of breath, PND, edema, orthopnea, presyncope or syncope. She feels winded at times with moderate exertion attributing this to weight and allergies.  She is on multiple medications to manage her allergies. Her physical activity includes walking and caring for her great-grandchildren. She plans to utilize her [employer] and trainer more regularly. Dietary efforts include reducing sugar intake and increasing water consumption. She is working on reducing nighttime snacking. She does not consume alcohol.   ROS: See HPI  Studies Reviewed       No results found for: LIPOA  Risk Assessment/Calculations   HYPERTENSION CONTROL Vitals:   12/29/23 1450 12/29/23 1518  BP: (!) 132/94 (!) 138/90    The patient's blood pressure is elevated above target today.  In order to address the patient's elevated BP: A current anti-hypertensive medication was adjusted today.          Physical Exam VS:  BP (!) 138/90   Pulse 62   Ht 5' 2 (1.575 m)   Wt 272 lb (123.4 kg)   SpO2 97%   BMI 49.75 kg/m    Wt Readings from Last 3 Encounters:  12/29/23 272 lb (123.4 kg)  11/10/23 277 lb (125.6 kg)  10/01/23 276 lb 12.8 oz (125.6 kg)    GEN: Obese, well developed in no  acute distress NECK: No JVD; No carotid bruits CARDIAC: RRR, no murmurs, rubs, gallops RESPIRATORY:  Clear to auscultation without rales, wheezing or rhonchi  ABDOMEN: Soft, non-tender, non-distended EXTREMITIES:  No edema; No deformity    Assessment & Plan Essential hypertension   BP is elevated in clinic and  remains elevated on my recheck. Home BP is elevated as well. She would like to reduce pill burden.  We had a lengthy discussion about the importance of diet and exercise.  She was encouraged to set one goal at a time for dietary modification.  She politely refused GLP-1 inhibitor to aid in weight loss. -Discontinue losartan   -Start olmesartan  40 mg once daily -BMET in 7-10 days after starting olmesartan  -Send blood pressure readings through MyChart after 5 days on new therapy -Encouraged regular physical activity as well as aiming for 150 minutes of moderate intensity exercise each week  - Weight loss encouraged, consider using my fitness pal app for tracking calories - Low-sodium, mostly whole food diet avoiding processed food, saturated fat, sugar, and other simple carbohydrates -Recommendation to reduce intake of sugary drinks  Obesity  Cardiac risk Aortic atherosclerosis Hyperlipidemia LDL goal < 70 Aortic atherosclerosis noted on chest CT 05/2022.  Negative Myoview  09/2020. She denies chest pain, dyspnea, or other symptoms concerning for angina.  No indication for further ischemic evaluation at this time.  Lipids are well-controlled with LDL of 70 May 2025.  She is not interested in starting GLP-1 agonist for help with weight loss at this time.  Lengthy discussion about heart healthy diet and the importance of regular exercise. -Recommend heart healthy/Mediterranean type diet avoiding processed foods, sugar and other simple carbohydrates and limiting saturated fat -Recommend active lifestyle, aiming for at least 150 minutes of moderate intensity exercise each week - Continue aspirin , carvedilol , simvastatin .  Palpitations Cardiac monitor 09/2020 with predominant sinus rhythm, rare supraventricular and ventricular ectopy.  Palpitations quiescent at this time.   -Continue carvedilol   Valve disease History of mitral valve disease in her father and aortic valve disease in her mother.  She is  currently asymptomatic.  TTE 04/26/2020 with normal LVEF, aortic valve tricuspid, no AI or AS.  Mitral valve normal in structure, trivial MR. -Consider repeat echo if symptoms develop -Continue to monitor clinically for now         Dispo: Keep your October appointment with Dr. Lonni  Signed, Rosaline Bane, NP-C "

## 2023-12-23 ENCOUNTER — Other Ambulatory Visit: Payer: Self-pay | Admitting: Family Medicine

## 2023-12-23 DIAGNOSIS — M5416 Radiculopathy, lumbar region: Secondary | ICD-10-CM

## 2023-12-23 MED ORDER — MELOXICAM 15 MG PO TABS: 15.0000 mg | ORAL_TABLET | Freq: Every day | ORAL | 1 refills | Status: AC

## 2023-12-23 NOTE — Telephone Encounter (Signed)
 Copied from CRM #[id number]. Topic: Clinical - Medication Refill >> Dec 23, 2023 12:14 PM Thersia C wrote: Medication: meloxicam  (MOBIC ) 15 MG tablet  Has the patient contacted their pharmacy? Yes (Agent: If no, request that the patient contact the pharmacy for the refill. If patient does not wish to contact the pharmacy document the reason why and proceed with request.) (Agent: If yes, when and what did the pharmacy advise?)  This is the patient's preferred pharmacy:  [facility]) [place]  - [place], [place] - [address] [facility] [address] [place] [address] Phone: [phone] Fax: [phone]   Is this the correct pharmacy for this prescription? Yes If no, delete pharmacy and type the correct one.   Has the prescription been filled recently? No  Is the patient out of the medication? Yes  Has the patient been seen for an appointment in the last year OR does the patient have an upcoming appointment? Yes  Can we respond through MyChart? Yes  Agent: Please be advised that Rx refills may take up to 3 business days. We ask that you follow-up with your pharmacy.

## 2023-12-29 ENCOUNTER — Ambulatory Visit (HOSPITAL_BASED_OUTPATIENT_CLINIC_OR_DEPARTMENT_OTHER): Admitting: Nurse Practitioner

## 2023-12-29 ENCOUNTER — Encounter (HOSPITAL_BASED_OUTPATIENT_CLINIC_OR_DEPARTMENT_OTHER): Payer: Self-pay | Admitting: Nurse Practitioner

## 2023-12-29 VITALS — BP 138/94 | HR 62 | Ht 62.0 in | Wt 272.0 lb

## 2023-12-29 DIAGNOSIS — Z7189 Other specified counseling: Secondary | ICD-10-CM

## 2023-12-29 DIAGNOSIS — E785 Hyperlipidemia, unspecified: Secondary | ICD-10-CM

## 2023-12-29 DIAGNOSIS — I10 Essential (primary) hypertension: Secondary | ICD-10-CM

## 2023-12-29 DIAGNOSIS — R002 Palpitations: Secondary | ICD-10-CM | POA: Diagnosis not present

## 2023-12-29 DIAGNOSIS — Z5181 Encounter for therapeutic drug level monitoring: Secondary | ICD-10-CM | POA: Diagnosis not present

## 2023-12-29 DIAGNOSIS — I700 Atherosclerosis of aorta: Secondary | ICD-10-CM | POA: Diagnosis not present

## 2023-12-29 DIAGNOSIS — Z8249 Family history of ischemic heart disease and other diseases of the circulatory system: Secondary | ICD-10-CM | POA: Diagnosis not present

## 2023-12-29 MED ORDER — OLMESARTAN MEDOXOMIL 40 MG PO TABS: 40.0000 mg | ORAL_TABLET | Freq: Every day | ORAL | 0 refills | Status: DC

## 2023-12-29 NOTE — Patient Instructions (Signed)
 Medication Instructions:  STOP LOSARTAN    START OLMESARTAN  40 MG DAILY  *If you need a refill on your cardiac medications before your next appointment, please call your pharmacy*  Lab Work: BMET ON 9/11  If you have labs (blood work) drawn today and your tests are completely normal, you will receive your results only by: MyChart Message (if you have MyChart) OR A paper copy in the mail If you have any lab test that is abnormal or we need to change your treatment, we will call you to review the results.  Testing/Procedures: NONE   Follow-Up: KEEP AS SCHEDULED   Other Instructions MYFITNESSPAL APP TO HELP WITH WEIGHT LOSS

## 2023-12-30 ENCOUNTER — Other Ambulatory Visit: Payer: Self-pay | Admitting: Family Medicine

## 2023-12-30 ENCOUNTER — Other Ambulatory Visit: Payer: Self-pay | Admitting: Cardiology

## 2023-12-30 ENCOUNTER — Other Ambulatory Visit (HOSPITAL_BASED_OUTPATIENT_CLINIC_OR_DEPARTMENT_OTHER): Payer: Self-pay | Admitting: Nurse Practitioner

## 2023-12-30 DIAGNOSIS — E1169 Type 2 diabetes mellitus with other specified complication: Secondary | ICD-10-CM

## 2023-12-31 ENCOUNTER — Ambulatory Visit (INDEPENDENT_AMBULATORY_CARE_PROVIDER_SITE_OTHER): Admitting: Allergy

## 2023-12-31 ENCOUNTER — Encounter: Payer: Self-pay | Admitting: Allergy

## 2023-12-31 VITALS — BP 122/78 | HR 74 | Resp 18

## 2023-12-31 DIAGNOSIS — Z130 Encounter for screening for diseases of the blood and blood-forming organs and certain disorders involving the immune mechanism: Secondary | ICD-10-CM

## 2023-12-31 DIAGNOSIS — K219 Gastro-esophageal reflux disease without esophagitis: Secondary | ICD-10-CM

## 2023-12-31 DIAGNOSIS — J301 Allergic rhinitis due to pollen: Secondary | ICD-10-CM | POA: Diagnosis not present

## 2023-12-31 DIAGNOSIS — J3089 Other allergic rhinitis: Secondary | ICD-10-CM

## 2023-12-31 DIAGNOSIS — J4540 Moderate persistent asthma, uncomplicated: Secondary | ICD-10-CM | POA: Diagnosis not present

## 2023-12-31 DIAGNOSIS — B999 Unspecified infectious disease: Secondary | ICD-10-CM | POA: Diagnosis not present

## 2023-12-31 NOTE — Progress Notes (Signed)
 Follow Up Note  RE: Renee Flores MRN: [id number] DOB: 1965/08/06 Date of Office Visit: 12/31/2023  Referring provider: Mahlon Comer BRAVO, MD Primary care provider: Mahlon Comer BRAVO, MD  Chief Complaint: Allergies (Still having congestion and phlegm production )  History of Present Illness: I had the pleasure of seeing Renee Flores for a follow up visit at the [facility] on 12/31/2023. She is a 58 y.o. female, who is being followed for allergic rhinitis, asthma, recurrent infections and GERD. Her previous allergy  office visit was on 10/01/2023 with Dr. Luke. Today is a regular follow up visit.  Discussed the use of AI scribe software for clinical note transcription with the patient, who gave verbal consent to proceed.    She has a history of recurrent respiratory infections, but had no additional steroids or antibiotics since the last visit.   She experiences persistent respiratory symptoms, including daily phlegm and mucus production, which she manages by coughing to expel the mucus. The patient reports that when she coughs, it often sounds congested, but this improves once the mucus is cleared. She uses Wixela in the morning and night, and Airsupra  three times daily, which helps alleviate the sensation of 'heavy lungs'. Her rescue inhaler is used daily.  Her allergy  management includes Singulair  at night, azelastine  nasal spray (two sprays per nostril twice daily), and Flonase . These medications are helpful, especially during allergy  season. Saline rinses are used to manage nasal congestion.  She experiences heartburn, managed with medication, and reports hoarseness and throat discomfort, particularly after being outside. Her CPAP machine contributes to nasal dryness.  She is trying to engage in regular physical activity, including walking a mile and a half at the gym. She has a history of joint issues, including knee replacements and ankle fusion, impacting her  mobility, particularly on inclines.  She has received her flu shot and pneumonia vaccine, though she experienced increased coughing following the pneumonia shot, which required antibiotics. No fever, chills, or significant changes in appetite or bowel habits are reported. Occasional shortness of breath with strenuous activity is noted, but no wheezing or chest tightness.     Assessment and Plan: Renee Flores is a 58 y.o. female with: Seasonal allergic rhinitis due to pollen Allergic rhinitis due to mold Past history - 2025 skin testing positive to grass, dust mites and mold. Interim history - PND in the mornings. CPAP drying nares out at times. Continue environmental control measures as below. Use over the counter antihistamines such as Zyrtec (cetirizine), Claritin  (loratadine ), Allegra (fexofenadine), or Xyzal (levocetirizine) daily as needed. May take twice a day during allergy  flares. May switch antihistamines every few months. Use azelastine  nasal spray 1-2 sprays per nostril twice a day as needed for runny nose/drainage. Use Flonase  (fluticasone ) nasal spray 1-2 sprays per nostril once a day as needed for nasal congestion.  Nasal saline spray (i.e., Simply Saline) or nasal saline lavage (i.e., NeilMed) is recommended as needed and prior to medicated nasal sprays. Consider allergy  injections for long term control if above medications do not help the symptoms.  Asthma must be in better control before starting.    Recurrent infections Past history - 2025 labs normal immunoglobulin levels, low pneumococcal titers. Interim history - last infection required antibiotics x 2. Keep track of infections and antibiotics use. Get bloodwork to look at vaccine response.   Moderate persistent asthma - not well controlled.  Past history - Diagnosed by pulmonologist with symptoms of wheezing and shortness of breath.  Currently managed with Wixela inhaler. Normal alpha-1 antitrypsin level. 2025 CXR normal. 2025  spirometry was unremarkable.  Interim history - has pulm appt coming up and now using Airsupra  three times a day with some benefit. Consider biologics for asthma control. Ask your pulmonologist at your next visit.  Handout given for [place], Nucala and Fasenra.  Continue plan as per pulmonology. Daily controller medication(s): Continue Wixela 250mcg 1 puff twice a day and rinse mouth after each use.  Continue Singulair  (montelukast ) 10mg  daily at night. May use Airsupra  rescue inhaler 2 puffs every 4 to 6 hours as needed for shortness of breath, chest tightness, coughing, and wheezing. Do not use more than 12 puffs in 24 hours. May use Airsupra  rescue inhaler 2 puffs 5 to 15 minutes prior to strenuous physical activities. Rinse mouth after each use.  Monitor frequency of use - if you need to use it more than twice per week on a consistent basis let us  know.    Gastroesophageal reflux disease, unspecified whether esophagitis present Continue lifestyle and dietary modifications. Continue omeprazole  40mg  once day - nothing to eat or drink for 20-30 minutes afterwards.  Return in about 3 months (around 03/31/2024).  No orders of the defined types were placed in this encounter.  Lab Orders         Strep pneumoniae 23 Serotypes IgG      Diagnostics: None.   Medication List:  Current Outpatient Medications  Medication Sig Dispense Refill   Albuterol -Budesonide (AIRSUPRA ) 90-80 MCG/ACT AERO Inhale 2 puffs into the lungs every 6 (six) hours as needed. 1 g 3   aspirin  EC 81 MG tablet Take 81 mg by mouth daily.     Azelastine  HCl 137 MCG/SPRAY SOLN INSTILL 1-2 SPRAYS INTO BOTH NOSTRILS 2 (TWO) TIMES DAILY AS NEEDED (NASAL DRAINAGE). 90 mL 1   CALCIUM  MAGNESIUM ZINC PO Take 1 tablet by mouth daily in the afternoon.     carvedilol  (COREG ) 12.5 MG tablet Take 1 tablet (12.5 mg total) by mouth 2 (two) times daily. 180 tablet 3   Cholecalciferol (VITAMIN D3) 50 MCG (2000 UT) TABS      cloNIDine   (CATAPRES ) 0.1 MG tablet Take 1 tablet (0.1 mg total) by mouth 3 (three) times daily. 270 tablet 10   cyclobenzaprine  (FLEXERIL ) 5 MG tablet Take 1 tablet (5 mg total) by mouth 3 (three) times daily as needed for muscle spasms. 30 tablet 1   fluticasone  (FLONASE ) 50 MCG/ACT nasal spray Place 2 sprays into both nostrils daily. 16 g 6   gabapentin  (NEURONTIN ) 300 MG capsule Take 2 capsules (600 mg total) by mouth at bedtime. 180 capsule 1   ketotifen  (ZADITOR ) 0.025 % ophthalmic solution Place 2 drops into both eyes daily as needed (for irritation). 5 mL 0   meloxicam  (MOBIC ) 15 MG tablet Take 1 tablet (15 mg total) by mouth daily. 90 tablet 1   montelukast  (SINGULAIR ) 10 MG tablet Take 1 tablet (10 mg total) by mouth at bedtime. 90 tablet 1   Multiple Vitamins-Minerals (ONE-A-DAY WOMENS 50+ ADVANTAGE PO) Take 1 tablet by mouth daily. Unknown strength     olmesartan  (BENICAR ) 40 MG tablet Take 1 tablet (40 mg total) by mouth daily. 30 tablet 0   omeprazole  (PRILOSEC) 40 MG capsule Take 1 capsule (40 mg total) by mouth daily. 90 capsule 3   ondansetron  (ZOFRAN ) 4 MG tablet Take 1 tablet (4 mg total) by mouth every 8 (eight) hours as needed for nausea or vomiting. 20 tablet 0  simvastatin  (ZOCOR ) 40 MG tablet TAKE 1 TABLET BY MOUTH EVERYDAY AT BEDTIME 90 tablet 1   spironolactone  (ALDACTONE ) 25 MG tablet TAKE 1/2 TABLET BY MOUTH EVERY DAY 45 tablet 1   WIXELA INHUB 250-50 MCG/ACT AEPB INHALE 1 PUFF INTO THE LUNGS IN THE MORNING AND AT BEDTIME. 60 each 5   No current facility-administered medications for this visit.   Allergies: Allergies  Allergen Reactions   Hydrochlorothiazide  Nausea And Vomiting    Other reaction(s): Other (See Comments) Leg Cramps Leg Cramps  Other reaction(s): Other (See Comments) Leg Cramps   Morphine Other (See Comments), Rash and Nausea And Vomiting    Hallucinating  Other reaction(s): Other (See Comments) Hallucinating Hallucinating Hallucinating  Hallucinating  Hallucinating  Hallucinating  Other reaction(s): Other (See Comments) Hallucinating Hallucinating   Vancomycin  Nausea And Vomiting    Ringing in ears   Erythromycin Base Hives   Erythromycin Base Hives   Doxycycline  Nausea Only and Rash   I reviewed her past medical history, social history, family history, and environmental history and no significant changes have been reported from her previous visit.  Review of Systems  Constitutional:  Negative for appetite change, chills, fever and unexpected weight change.  HENT:  Positive for postnasal drip and voice change. Negative for congestion.   Eyes:  Negative for itching.  Respiratory:  Positive for cough and shortness of breath. Negative for chest tightness and wheezing.   Cardiovascular:  Negative for chest pain.  Gastrointestinal:  Negative for abdominal pain.  Genitourinary:  Negative for difficulty urinating.  Skin:  Negative for rash.  Allergic/Immunologic: Positive for environmental allergies.    Objective: BP 122/78   Pulse 74   Resp 18   SpO2 94%  There is no height or weight on file to calculate BMI. Physical Exam Vitals and nursing note reviewed.  Constitutional:      Appearance: Normal appearance. She is well-developed. She is obese.  HENT:     Head: Normocephalic and atraumatic.     Right Ear: Tympanic membrane and external ear normal.     Left Ear: Tympanic membrane and external ear normal.     Nose: Nose normal.     Mouth/Throat:     Mouth: Mucous membranes are moist.     Pharynx: Oropharynx is clear.  Eyes:     Conjunctiva/sclera: Conjunctivae normal.  Cardiovascular:     Rate and Rhythm: Normal rate and regular rhythm.     Heart sounds: Normal heart sounds. No murmur heard.    No friction rub. No gallop.  Pulmonary:     Effort: Pulmonary effort is normal.     Breath sounds: Normal breath sounds. No wheezing, rhonchi or rales.  Musculoskeletal:     Cervical back: Neck supple.  Skin:     General: Skin is warm.     Findings: No rash.  Neurological:     Mental Status: She is alert and oriented to person, place, and time.  Psychiatric:        Behavior: Behavior normal.    Previous notes and tests were reviewed. The plan was reviewed with the patient/family, and all questions/concerned were addressed.  It was my pleasure to see Renee Flores today and participate in her care. Please feel free to contact me with any questions or concerns.  Sincerely,  Orlan Cramp, DO Allergy  & Immunology  [facility]  [place] office: [phone] [facility] office: [phone]

## 2023-12-31 NOTE — Patient Instructions (Addendum)
 Keep up with the exercising!  Environmental allergies 2025 skin testing positive to grass, dust mites and mold. Continue environmental control measures as below. Use over the counter antihistamines such as Zyrtec (cetirizine), Claritin  (loratadine ), Allegra (fexofenadine), or Xyzal (levocetirizine) daily as needed. May take twice a day during allergy  flares. May switch antihistamines every few months. Use azelastine  nasal spray 1-2 sprays per nostril twice a day as needed for runny nose/drainage. Use Flonase  (fluticasone ) nasal spray 1-2 sprays per nostril once a day as needed for nasal congestion.  Nasal saline spray (i.e., Simply Saline) or nasal saline lavage (i.e., NeilMed) is recommended as needed and prior to medicated nasal sprays. Consider allergy  injections for long term control if above medications do not help the symptoms.  Asthma must be in better control before starting.   Infections Keep track of infections and antibiotics use. Get bloodwork to look at vaccine response.  Asthma Consider biologics for asthma control. Ask your pulmonologist at your next visit.  Continue plan as per pulmonology. Daily controller medication(s): Continue Wixela 250mcg 1 puff twice a day and rinse mouth after each use.  Continue Singulair  (montelukast ) 10mg  daily at night. May use Airsupra  rescue inhaler 2 puffs every 4 to 6 hours as needed for shortness of breath, chest tightness, coughing, and wheezing. Do not use more than 12 puffs in 24 hours. May use Airsupra  rescue inhaler 2 puffs 5 to 15 minutes prior to strenuous physical activities. Rinse mouth after each use.  Monitor frequency of use - if you need to use it more than twice per week on a consistent basis let us  know.  Breathing control goals:  Full participation in all desired activities (may need albuterol  before activity) Albuterol  use two times or less a week on average (not counting use with activity) Cough interfering with sleep two  times or less a month Oral steroids no more than once a year No hospitalizations   GERD Continue lifestyle and dietary modifications. Continue omeprazole  40mg  once day - nothing to eat or drink for 20-30 minutes afterwards.   Follow up in 3 months or sooner if needed.   Reducing Pollen Exposure Pollen seasons: trees (spring), grass (summer) and ragweed/weeds (fall). Keep windows closed in your home and car to lower pollen exposure.  Install air conditioning in the bedroom and throughout the house if possible.  Avoid going out in dry windy days - especially early morning. Pollen counts are highest between 5 - 10 AM and on dry, hot and windy days.  Save outside activities for late afternoon or after a heavy rain, when pollen levels are lower.  Avoid mowing of grass if you have grass pollen allergy . Be aware that pollen can also be transported indoors on people and pets.  Dry your clothes in an automatic dryer rather than hanging them outside where they might collect pollen.  Rinse hair and eyes before bedtime.  Control of House Dust Mite Allergen Dust mite allergens are a common trigger of allergy  and asthma symptoms. While they can be found throughout the house, these microscopic creatures thrive in warm, humid environments such as bedding, upholstered furniture and carpeting. Because so much time is spent in the bedroom, it is essential to reduce mite levels there.  Encase pillows, mattresses, and box springs in special allergen-proof fabric covers or airtight, zippered plastic covers.  Bedding should be washed weekly in hot water (130 F) and dried in a hot dryer. Allergen-proof covers are available for comforters and pillows that can't be  regularly washed.  Wash the allergy -proof covers every few months. Minimize clutter in the bedroom. Keep pets out of the bedroom.  Keep humidity less than 50% by using a dehumidifier or air conditioning. You can buy a humidity measuring device called a  hygrometer to monitor this.  If possible, replace carpets with hardwood, linoleum, or washable area rugs. If that's not possible, vacuum frequently with a vacuum that has a HEPA filter. Remove all upholstered furniture and non-washable window drapes from the bedroom. Remove all non-washable stuffed toys from the bedroom.  Wash stuffed toys weekly.  Mold Control Mold and fungi can grow on a variety of surfaces provided certain temperature and moisture conditions exist.  Outdoor molds grow on plants, decaying vegetation and soil. The major outdoor mold, Alternaria and Cladosporium, are found in very high numbers during hot and dry conditions. Generally, a late summer - fall peak is seen for common outdoor fungal spores. Rain will temporarily lower outdoor mold spore count, but counts rise rapidly when the rainy period ends. The most important indoor molds are Aspergillus and Penicillium. Dark, humid and poorly ventilated basements are ideal sites for mold growth. The next most common sites of mold growth are the bathroom and the kitchen. Outdoor (Seasonal) Mold Control Use air conditioning and keep windows closed. Avoid exposure to decaying vegetation. Avoid leaf raking. Avoid grain handling. Consider wearing a face mask if working in moldy areas.  Indoor (Perennial) Mold Control  Maintain humidity below 50%. Get rid of mold growth on hard surfaces with water, detergent and, if necessary, 5% bleach (do not mix with other cleaners). Then dry the area completely. If mold covers an area more than 10 square feet, consider hiring an indoor environmental professional. For clothing, washing with soap and water is best. If moldy items cannot be cleaned and dried, throw them away. Remove sources e.g. contaminated carpets. Repair and seal leaking roofs or pipes. Using dehumidifiers in damp basements may be helpful, but empty the water and clean units regularly to prevent mildew from forming. All rooms,  especially basements, bathrooms and kitchens, require ventilation and cleaning to deter mold and mildew growth. Avoid carpeting on concrete or damp floors, and storing items in damp areas.

## 2024-01-05 ENCOUNTER — Other Ambulatory Visit: Payer: Self-pay | Admitting: Family Medicine

## 2024-01-05 ENCOUNTER — Telehealth: Payer: Self-pay | Admitting: Family Medicine

## 2024-01-05 DIAGNOSIS — T7840XA Allergy, unspecified, initial encounter: Secondary | ICD-10-CM

## 2024-01-05 MED ORDER — MONTELUKAST SODIUM 10 MG PO TABS: 10.0000 mg | ORAL_TABLET | Freq: Every day | ORAL | 1 refills | Status: DC

## 2024-01-05 NOTE — Telephone Encounter (Signed)
 Copied from CRM #[id number]. Topic: Clinical - Medication Refill >> Jan 05, 2024 11:49 AM Shereese L wrote: Medication: montelukast  (SINGULAIR ) 10 MG tablet  Has the patient contacted their pharmacy? Yes (Agent: If no, request that the patient contact the pharmacy for the refill. If patient does not wish to contact the pharmacy document the reason why and proceed with request.) (Agent: If yes, when and what did the pharmacy advise?)  This is the patient's preferred pharmacy:  [facility] [place]  - [place], [place] - [id number] [facility] [address] [place] [address] Phone: [phone] Fax: [phone]  Is this the correct pharmacy for this prescription? Yes If no, delete pharmacy and type the correct one.   Has the prescription been filled recently? Yes  Is the patient out of the medication? Yes  Has the patient been seen for an appointment in the last year OR does the patient have an upcoming appointment? Yes  Can we respond through MyChart? Yes  Agent: Please be advised that Rx refills may take up to 3 business days. We ask that you follow-up with your pharmacy.

## 2024-01-05 NOTE — Telephone Encounter (Deleted)
 Copied from CRM #[id number]. Topic: Clinical - Medication Refill >> Jan 05, 2024 11:49 AM Shereese L wrote: Medication: montelukast  (SINGULAIR ) 10 MG tablet  Has the patient contacted their pharmacy? Yes (Agent: If no, request that the patient contact the pharmacy for the refill. If patient does not wish to contact the pharmacy document the reason why and proceed with request.) (Agent: If yes, when and what did the pharmacy advise?)  This is the patient's preferred pharmacy:  [facility]) [place]  - [place], [place] - [id number] [facility] [address] [place] [address] Phone: [phone] Fax: [phone]  Is this the correct pharmacy for this prescription? Yes If no, delete pharmacy and type the correct one.   Has the prescription been filled recently? Yes  Is the patient out of the medication? Yes  Has the patient been seen for an appointment in the last year OR does the patient have an upcoming appointment? Yes  Can we respond through MyChart? Yes  Agent: Please be advised that Rx refills may take up to 3 business days. We ask that you follow-up with your pharmacy.

## 2024-01-05 NOTE — Telephone Encounter (Signed)
 Medication sent to pharmacy

## 2024-01-05 NOTE — Addendum Note (Signed)
 Addended by: Coco Sharpnack A on: 01/05/2024 11:56 AM   Modules accepted: Orders

## 2024-01-07 DIAGNOSIS — I10 Essential (primary) hypertension: Secondary | ICD-10-CM | POA: Diagnosis not present

## 2024-01-07 DIAGNOSIS — B999 Unspecified infectious disease: Secondary | ICD-10-CM | POA: Diagnosis not present

## 2024-01-07 DIAGNOSIS — Z5181 Encounter for therapeutic drug level monitoring: Secondary | ICD-10-CM | POA: Diagnosis not present

## 2024-01-07 LAB — BASIC METABOLIC PANEL WITH GFR
BUN/Creatinine Ratio: 17 (ref 9–23)
BUN: 13 mg/dL (ref 6–24)
CO2: 21 mmol/L (ref 20–29)
Calcium: 9.3 mg/dL (ref 8.7–10.2)
Chloride: 101 mmol/L (ref 96–106)
Creatinine, Ser: 0.75 mg/dL (ref 0.57–1.00)
Glucose: 108 mg/dL — ABNORMAL HIGH (ref 70–99)
Potassium: 4.2 mmol/L (ref 3.5–5.2)
Sodium: 139 mmol/L (ref 134–144)
eGFR: 93 mL/min/1.73 (ref >59)

## 2024-01-08 ENCOUNTER — Ambulatory Visit: Payer: Self-pay | Admitting: Nurse Practitioner

## 2024-01-12 ENCOUNTER — Ambulatory Visit: Payer: Self-pay | Admitting: Allergy

## 2024-01-12 LAB — STREP PNEUMONIAE 23 SEROTYPES IGG
Pneumo Ab Type 1*: 2 ug/mL (ref >1.3)
Pneumo Ab Type 12 (12F)*: 0.4 ug/mL — ABNORMAL LOW (ref >1.3)
Pneumo Ab Type 14*: 1.6 ug/mL (ref >1.3)
Pneumo Ab Type 17 (17F)*: 2.6 ug/mL (ref >1.3)
Pneumo Ab Type 19 (19F)*: >29.4 ug/mL (ref >1.3)
Pneumo Ab Type 2*: >58.1 ug/mL (ref >1.3)
Pneumo Ab Type 20*: >47.4 ug/mL (ref >1.3)
Pneumo Ab Type 22 (22F)*: 10.4 ug/mL (ref >1.3)
Pneumo Ab Type 23 (23F)*: 0.6 ug/mL — ABNORMAL LOW (ref >1.3)
Pneumo Ab Type 26 (6B)*: 0.7 ug/mL — ABNORMAL LOW (ref >1.3)
Pneumo Ab Type 3*: 1 ug/mL — ABNORMAL LOW (ref >1.3)
Pneumo Ab Type 34 (10A)*: >44.1 ug/mL (ref >1.3)
Pneumo Ab Type 4*: 5.2 ug/mL (ref >1.3)
Pneumo Ab Type 43 (11A)*: >11.6 ug/mL (ref >1.3)
Pneumo Ab Type 5*: 1.1 ug/mL — ABNORMAL LOW (ref >1.3)
Pneumo Ab Type 51 (7F)*: 5.8 ug/mL (ref >1.3)
Pneumo Ab Type 54 (15B)*: 15.7 ug/mL (ref >1.3)
Pneumo Ab Type 56 (18C)*: 3 ug/mL (ref >1.3)
Pneumo Ab Type 57 (19A)*: 6.1 ug/mL (ref >1.3)
Pneumo Ab Type 68 (9V)*: >16.2 ug/mL (ref >1.3)
Pneumo Ab Type 70 (33F)*: 10.1 ug/mL (ref >1.3)
Pneumo Ab Type 8*: 7.1 ug/mL (ref >1.3)
Pneumo Ab Type 9 (9N)*: >37 ug/mL (ref >1.3)

## 2024-01-14 NOTE — Telephone Encounter (Signed)
 Good morning,   Yes it is still within the normal range, no changes in your medications   Renee Flores

## 2024-01-22 ENCOUNTER — Other Ambulatory Visit (HOSPITAL_BASED_OUTPATIENT_CLINIC_OR_DEPARTMENT_OTHER): Payer: Self-pay | Admitting: Nurse Practitioner

## 2024-02-01 ENCOUNTER — Other Ambulatory Visit: Payer: Self-pay

## 2024-02-01 MED ORDER — GABAPENTIN 300 MG PO CAPS: 600.0000 mg | ORAL_CAPSULE | Freq: Every day | ORAL | 1 refills | Status: AC

## 2024-02-03 ENCOUNTER — Ambulatory Visit: Admitting: Pulmonary Disease

## 2024-02-03 ENCOUNTER — Encounter: Payer: Self-pay | Admitting: Pulmonary Disease

## 2024-02-03 VITALS — BP 138/84 | HR 72 | Ht 62.0 in | Wt 274.0 lb

## 2024-02-03 DIAGNOSIS — D721 Eosinophilia, unspecified: Secondary | ICD-10-CM | POA: Diagnosis not present

## 2024-02-03 DIAGNOSIS — J309 Allergic rhinitis, unspecified: Secondary | ICD-10-CM

## 2024-02-03 DIAGNOSIS — G4733 Obstructive sleep apnea (adult) (pediatric): Secondary | ICD-10-CM | POA: Diagnosis not present

## 2024-02-03 DIAGNOSIS — E669 Obesity, unspecified: Secondary | ICD-10-CM

## 2024-02-03 DIAGNOSIS — J4540 Moderate persistent asthma, uncomplicated: Secondary | ICD-10-CM | POA: Diagnosis not present

## 2024-02-03 MED ORDER — INCRUSE ELLIPTA 62.5 MCG/ACT IN AEPB: 1.0000 | INHALATION_SPRAY | Freq: Every day | RESPIRATORY_TRACT | 5 refills | Status: DC

## 2024-02-03 MED ORDER — MONTELUKAST SODIUM 10 MG PO TABS: 10.0000 mg | ORAL_TABLET | Freq: Every day | ORAL | 1 refills | Status: DC

## 2024-02-03 MED ORDER — AIRSUPRA 90-80 MCG/ACT IN AERO: 2.0000 | INHALATION_SPRAY | Freq: Four times a day (QID) | RESPIRATORY_TRACT | 3 refills | Status: AC | PRN

## 2024-02-03 NOTE — Progress Notes (Signed)
 Subjective:   PATIENT ID: Renee Flores Colace GENDER: female DOB: Jun 13, 1965, MRN: [id number]   HPI Discussed the use of AI scribe software for clinical note transcription with the patient, who gave verbal consent to proceed.  History of Present Illness   Renee Flores is a 58 year old woman, non-smoker with asthma and sleep apnea who returns for follow up.  She experiences recurrent respiratory infections, particularly during allergy  seasons, leading to bronchitis approximately twice a month. Treatment often includes antibiotics and prednisone , with improvement noted only with prednisone .  Asthma management includes Wixela, one puff twice a day, and Airsupra , three times a day, especially during allergy  seasons. She has high eosinophil counts in the past.  She has been evaluated at immunology, with appropriate response to pneumococcal vaccine so low concern for immunodeficiency.  She has a history of fatty liver disease, raising concerns about frequent antibiotic and prednisone  use. She is pre-diabetic with a hemoglobin A1c at six.  She experiences shortness of breath related to weight and physical activity, with no fever or other systemic symptoms.       Past Medical History:  Diagnosis Date   Allergy     Arthritis    Asthma    Bulging lumbar disc    L4-5   Complication of anesthesia    anxiety attack after waking up from anesthesia   Factor 5 Leiden mutation, heterozygous    Factor V deficiency, congenital (HCC) 03/23/2012   GERD (gastroesophageal reflux disease)    Heart murmur    Heart palpitations    Heart valve problem    mild- moderate mitral valve leakage    High cholesterol    HYPERCHOLESTEROLEMIA 02/27/2010   Qualifier: Diagnosis of  By: Lutricia RN, Denise     Hypersomnia with sleep apnea, unspecified 11/03/2013   Hypertension    Lumbar radiculopathy 08/12/2018   MS (multiple sclerosis)    questionable   Neuromuscular disorder (HCC)    OSA on CPAP  03/25/2017   Osteopenia 04/2016   T score -1.9   Osteoporosis    Preop cardiovascular exam 03/23/2012   Retrognathia 11/03/2013   Right knee DJD 07/08/2012   Severe obesity (BMI >= 40) (HCC) 11/03/2013   Shortness of breath 06/03/2013   Sleep apnea    Vitamin D  deficiency 2014   17     Family History  Problem Relation Age of Onset   Heart disease Father    Hypertension Father    Hyperlipidemia Father    Cancer Father    Other Father        Respiratory problems/Factor 5 disorder/1 bypass, 2 valve replacement (mitral and aortic)    Arthritis Father    Diabetes Father    Hearing loss Father    Obesity Father    Hypertension Mother    Diabetes Mother    Hyperlipidemia Mother    Arthritis Mother    Heart disease Mother    Obesity Mother    Varicose Veins Mother    Hypertension Sister    Colon cancer Paternal Aunt    Breast cancer Paternal Aunt 70   Cancer Paternal Aunt    Heart disease Maternal Grandfather    Lymphoma Maternal Grandfather    Heart failure Maternal Grandfather    Cancer Maternal Grandfather    Hearing loss Maternal Grandfather    Colon cancer Paternal Grandmother    Arthritis Paternal Grandmother    Kidney disease Paternal Grandmother    Obesity Paternal Grandmother    Hyperlipidemia  Brother    Hypertension Brother    Diabetes Brother    Obesity Brother    Hypertension Brother    Obesity Brother    Colon cancer Paternal Aunt    Breast cancer Paternal Aunt 97   Stroke Paternal Grandfather    Arthritis Paternal Grandfather    Cancer Maternal Grandmother    Miscarriages / Stillbirths Maternal Grandmother    ADD / ADHD Son    Arthritis Son    Birth defects Son    Hearing loss Son    ADD / ADHD Son    Anxiety disorder Son    Depression Son    Arthritis Paternal Aunt    Diabetes Maternal Uncle    Obesity Maternal Uncle    Diabetes Maternal Uncle      Social History   Socioeconomic History   Marital status: Widowed    Spouse name: [name]    Number of children: 2   Years of education: 12   Highest education level: 12th grade  Occupational History    Employer: [employer]  Tobacco Use   Smoking status: Never    Passive exposure: Never   Smokeless tobacco: Never  Vaping Use   Vaping status: Never Used  Substance and Sexual Activity   Alcohol use: Not Currently   Drug use: No   Sexual activity: Not Currently    Birth control/protection: Surgical    Comment: HYST-1st intercourse 58 yo-Fewer than 5 partners  Other Topics Concern   Not on file  Social History Narrative   [name]  and lives at home with her husband.   Patient has two children.   Patient works as a Product/process development scientist for [employer].   Patient drinks one cup of caffeine daily.   Patient is right-handed.   Patient has a high school education.   Social Drivers of Corporate investment banker Strain: Low Risk  (10/06/2023)   Overall Financial Resource Strain (CARDIA)    Difficulty of Paying Living Expenses: Not hard at all  Food Insecurity: No Food Insecurity (10/06/2023)   Hunger Vital Sign    Worried About Running Out of Food in the Last Year: Never true    Ran Out of Food in the Last Year: Never true  Transportation Needs: No Transportation Needs (10/06/2023)   PRAPARE - Administrator, Civil Service (Medical): No    Lack of Transportation (Non-Medical): No  Physical Activity: Sufficiently Active (10/06/2023)   Exercise Vital Sign    Days of Exercise per Week: 3 days    Minutes of Exercise per Session: 90 min  Stress: No Stress Concern Present (10/06/2023)   [employer] of Occupational Health - Occupational Stress Questionnaire    Feeling of Stress : Not at all  Social Connections: Moderately Integrated (10/06/2023)   Social Connection and Isolation Panel    Frequency of Communication with Friends and Family: More than three times a week    Frequency of Social Gatherings with Friends and Family: More than  three times a week    Attends Religious Services: More than 4 times per year    Active Member of [employer] or Organizations: Yes    Attends Banker Meetings: More than 4 times per year    Marital Status: Widowed  Intimate Partner Violence: Not At Risk (10/06/2023)   Humiliation, Afraid, Rape, and Kick questionnaire    Fear of Current or Ex-Partner: No    Emotionally Abused: No  Physically Abused: No    Sexually Abused: No     Allergies  Allergen Reactions   Hydrochlorothiazide  Nausea And Vomiting    Other reaction(s): Other (See Comments) Leg Cramps Leg Cramps  Other reaction(s): Other (See Comments) Leg Cramps   Morphine Other (See Comments), Rash and Nausea And Vomiting    Hallucinating  Other reaction(s): Other (See Comments) Hallucinating Hallucinating Hallucinating Hallucinating  Hallucinating  Hallucinating  Other reaction(s): Other (See Comments) Hallucinating Hallucinating   Vancomycin  Nausea And Vomiting    Ringing in ears   Erythromycin Base Hives   Erythromycin Base Hives   Doxycycline  Nausea Only and Rash     Outpatient Medications Prior to Visit  Medication Sig Dispense Refill   aspirin  EC 81 MG tablet Take 81 mg by mouth daily.     Azelastine  HCl 137 MCG/SPRAY SOLN INSTILL 1-2 SPRAYS INTO BOTH NOSTRILS 2 (TWO) TIMES DAILY AS NEEDED (NASAL DRAINAGE). 90 mL 1   CALCIUM  MAGNESIUM ZINC PO Take 1 tablet by mouth daily in the afternoon.     carvedilol  (COREG ) 12.5 MG tablet Take 1 tablet (12.5 mg total) by mouth 2 (two) times daily. 180 tablet 3   Cholecalciferol (VITAMIN D3) 50 MCG (2000 UT) TABS      cloNIDine  (CATAPRES ) 0.1 MG tablet Take 1 tablet (0.1 mg total) by mouth 3 (three) times daily. 270 tablet 10   cyclobenzaprine  (FLEXERIL ) 5 MG tablet Take 1 tablet (5 mg total) by mouth 3 (three) times daily as needed for muscle spasms. 30 tablet 1   fluticasone  (FLONASE ) 50 MCG/ACT nasal spray Place 2 sprays into both nostrils daily. 16 g 6    gabapentin  (NEURONTIN ) 300 MG capsule Take 2 capsules (600 mg total) by mouth at bedtime. 180 capsule 1   ketotifen  (ZADITOR ) 0.025 % ophthalmic solution Place 2 drops into both eyes daily as needed (for irritation). 5 mL 0   meloxicam  (MOBIC ) 15 MG tablet Take 1 tablet (15 mg total) by mouth daily. 90 tablet 1   Multiple Vitamins-Minerals (ONE-A-DAY WOMENS 50+ ADVANTAGE PO) Take 1 tablet by mouth daily. Unknown strength     olmesartan  (BENICAR ) 40 MG tablet TAKE 1 TABLET BY MOUTH EVERY DAY 90 tablet 3   omeprazole  (PRILOSEC) 40 MG capsule Take 1 capsule (40 mg total) by mouth daily. 90 capsule 3   ondansetron  (ZOFRAN ) 4 MG tablet Take 1 tablet (4 mg total) by mouth every 8 (eight) hours as needed for nausea or vomiting. 20 tablet 0   simvastatin  (ZOCOR ) 40 MG tablet TAKE 1 TABLET BY MOUTH EVERYDAY AT BEDTIME 90 tablet 1   spironolactone  (ALDACTONE ) 25 MG tablet TAKE 1/2 TABLET BY MOUTH EVERY DAY 45 tablet 1   WIXELA INHUB 250-50 MCG/ACT AEPB INHALE 1 PUFF INTO THE LUNGS IN THE MORNING AND AT BEDTIME. 60 each 5   Albuterol -Budesonide (AIRSUPRA ) 90-80 MCG/ACT AERO Inhale 2 puffs into the lungs every 6 (six) hours as needed. 1 g 3   montelukast  (SINGULAIR ) 10 MG tablet Take 1 tablet (10 mg total) by mouth at bedtime. 90 tablet 1   No facility-administered medications prior to visit.    Review of Systems  Constitutional:  Negative for chills, fever, malaise/fatigue and weight loss.  HENT:  Negative for congestion, sinus pain and sore throat.   Eyes: Negative.   Respiratory:  Positive for shortness of breath and wheezing. Negative for cough, hemoptysis and sputum production.   Cardiovascular:  Negative for chest pain, palpitations, orthopnea, claudication and leg swelling.  Gastrointestinal:  Negative for abdominal pain, heartburn, nausea and vomiting.  Genitourinary: Negative.   Musculoskeletal:  Negative for joint pain and myalgias.  Skin:  Negative for rash.  Neurological:  Negative for  weakness.  Endo/Heme/Allergies: Negative.   Psychiatric/Behavioral: Negative.        Objective:   Vitals:   02/03/24 0823  BP: 138/84  Pulse: 72  SpO2: 91%  Weight: 274 lb (124.3 kg)  Height: 5' 2 (1.575 m)    Physical Exam Constitutional:      General: She is not in acute distress.    Appearance: Normal appearance. She is obese.  Eyes:     General: No scleral icterus.    Conjunctiva/sclera: Conjunctivae normal.  Cardiovascular:     Rate and Rhythm: Normal rate and regular rhythm.  Pulmonary:     Breath sounds: No wheezing, rhonchi or rales.  Musculoskeletal:     Right lower leg: No edema.     Left lower leg: No edema.  Skin:    General: Skin is warm and dry.  Neurological:     General: No focal deficit present.    CBC    Component Value Date/Time   WBC 8.2 09/22/2023 0852   RBC 4.41 09/22/2023 0852   HGB 12.7 09/22/2023 0852   HGB 12.5 06/24/2023 1357   HCT 38.4 09/22/2023 0852   HCT 38.0 06/24/2023 1357   PLT 255.0 09/22/2023 0852   PLT 261 06/24/2023 1357   MCV 87.2 09/22/2023 0852   MCV 90 06/24/2023 1357   MCH 29.6 06/24/2023 1357   MCH 28.3 04/27/2021 2240   MCHC 33.0 09/22/2023 0852   RDW 13.1 09/22/2023 0852   RDW 13.0 06/24/2023 1357   LYMPHSABS 2.3 09/22/2023 0852   LYMPHSABS 2.9 06/24/2023 1357   MONOABS 0.7 09/22/2023 0852   EOSABS 0.9 (H) 09/22/2023 0852   EOSABS 0.5 (H) 06/24/2023 1357   BASOSABS 0.1 09/22/2023 0852   BASOSABS 0.1 06/24/2023 1357      Latest Ref Rng & Units 01/07/2024    8:44 AM 09/22/2023    8:52 AM 03/20/2023    8:54 AM  BMP  Glucose 70 - 99 mg/dL 891  883  894   BUN 6 - 24 mg/dL 13  17  16    Creatinine 0.57 - 1.00 mg/dL 9.24  9.07  9.21   BUN/Creat Ratio 9 - 23 17     Sodium 134 - 144 mmol/L 139  138  138   Potassium 3.5 - 5.2 mmol/L 4.2  4.2  4.1   Chloride 96 - 106 mmol/L 101  101  100   CO2 20 - 29 mmol/L 21  28  30    Calcium  8.7 - 10.2 mg/dL 9.3  9.8  9.7    Chest imaging:  PFT:    Latest Ref Rng  & Units 06/24/2022    2:45 PM  PFT Results  FVC-Pre L 2.71   FVC-Predicted Pre % 81   FVC-Post L 2.64   FVC-Predicted Post % 79   Pre FEV1/FVC % % 77   Post FEV1/FCV % % 90   FEV1-Pre L 2.10   FEV1-Predicted Pre % 80   FEV1-Post L 2.38   DLCO uncorrected ml/min/mmHg 22.88   DLCO UNC% % 114   DLCO corrected ml/min/mmHg 22.88   DLCO COR %Predicted % 114   DLVA Predicted % 127   TLC L 4.53   TLC % Predicted % 92   RV % Predicted % 81     Labs:  Path:  Echo:  Heart Catheterization:       Assessment & Plan:   Moderate persistent asthma, unspecified whether complicated - Plan: umeclidinium bromide (INCRUSE ELLIPTA) 62.5 MCG/ACT AEPB, Albuterol -Budesonide (AIRSUPRA ) 90-80 MCG/ACT AERO, montelukast  (SINGULAIR ) 10 MG tablet  Eosinophilia, unspecified type  Allergic rhinitis, unspecified seasonality, unspecified trigger  OSA on CPAP Assessment and Plan    Asthma with recurrent upper respiratory infections and bronchitis Exacerbations during allergy  seasons lead to infections and bronchitis. High eosinophil count indicates asthma-related inflammation. Frequent antibiotics and prednisone  use is concerning due to fatty liver. Current medications include Wixela and Airsupra . Immune shots administered for recurrent infections. - Add Incruse inhaler, one puff daily, to reduce Airsupra  use. - Continue Wixela, one puff twice daily. - Continue Airsupra , one puff every six hours as needed. - Consider biologic injection therapy to reduce eosinophil count and inflammation.  Obesity Obesity exacerbates asthma and health issues. She is pre-diabetic, not seeing an endocrinologist, and considering weight loss medications. Lifestyle changes include dietary modifications and increased physical activity. - Consider starting weight loss medication such as Zepbound if approved by neurology team. - Encourage continued dietary modifications, reducing sugar intake, and substituting healthier  snacks. - Encourage increased physical activity, such as regular gym visits and walking. - Monitor weight and adjust treatment plan as needed.  Obstructive sleep apnea - Managed by neurology team. Zepbound considered for weight loss and potential sleep apnea benefit.  Follow up in 6 months  Dorn Chill, MD [facility] Office: [phone]   Current Outpatient Medications:    aspirin  EC 81 MG tablet, Take 81 mg by mouth daily., Disp: , Rfl:    Azelastine  HCl 137 MCG/SPRAY SOLN, INSTILL 1-2 SPRAYS INTO BOTH NOSTRILS 2 (TWO) TIMES DAILY AS NEEDED (NASAL DRAINAGE)., Disp: 90 mL, Rfl: 1   CALCIUM  MAGNESIUM ZINC PO, Take 1 tablet by mouth daily in the afternoon., Disp: , Rfl:    carvedilol  (COREG ) 12.5 MG tablet, Take 1 tablet (12.5 mg total) by mouth 2 (two) times daily., Disp: 180 tablet, Rfl: 3   Cholecalciferol (VITAMIN D3) 50 MCG (2000 UT) TABS, , Disp: , Rfl:    cloNIDine  (CATAPRES ) 0.1 MG tablet, Take 1 tablet (0.1 mg total) by mouth 3 (three) times daily., Disp: 270 tablet, Rfl: 10   cyclobenzaprine  (FLEXERIL ) 5 MG tablet, Take 1 tablet (5 mg total) by mouth 3 (three) times daily as needed for muscle spasms., Disp: 30 tablet, Rfl: 1   fluticasone  (FLONASE ) 50 MCG/ACT nasal spray, Place 2 sprays into both nostrils daily., Disp: 16 g, Rfl: 6   gabapentin  (NEURONTIN ) 300 MG capsule, Take 2 capsules (600 mg total) by mouth at bedtime., Disp: 180 capsule, Rfl: 1   ketotifen  (ZADITOR ) 0.025 % ophthalmic solution, Place 2 drops into both eyes daily as needed (for irritation)., Disp: 5 mL, Rfl: 0   meloxicam  (MOBIC ) 15 MG tablet, Take 1 tablet (15 mg total) by mouth daily., Disp: 90 tablet, Rfl: 1   Multiple Vitamins-Minerals (ONE-A-DAY WOMENS 50+ ADVANTAGE PO), Take 1 tablet by mouth daily. Unknown strength, Disp: , Rfl:    olmesartan  (BENICAR ) 40 MG tablet, TAKE 1 TABLET BY MOUTH EVERY DAY, Disp: 90 tablet, Rfl: 3   omeprazole  (PRILOSEC) 40 MG capsule, Take 1 capsule  (40 mg total) by mouth daily., Disp: 90 capsule, Rfl: 3   ondansetron  (ZOFRAN ) 4 MG tablet, Take 1 tablet (4 mg total) by mouth every 8 (eight) hours as needed for nausea or vomiting., Disp: 20 tablet, Rfl: 0  simvastatin  (ZOCOR ) 40 MG tablet, TAKE 1 TABLET BY MOUTH EVERYDAY AT BEDTIME, Disp: 90 tablet, Rfl: 1   spironolactone  (ALDACTONE ) 25 MG tablet, TAKE 1/2 TABLET BY MOUTH EVERY DAY, Disp: 45 tablet, Rfl: 1   umeclidinium bromide (INCRUSE ELLIPTA) 62.5 MCG/ACT AEPB, Inhale 1 puff into the lungs daily., Disp: 30 each, Rfl: 5   WIXELA INHUB 250-50 MCG/ACT AEPB, INHALE 1 PUFF INTO THE LUNGS IN THE MORNING AND AT BEDTIME., Disp: 60 each, Rfl: 5   Albuterol -Budesonide (AIRSUPRA ) 90-80 MCG/ACT AERO, Inhale 2 puffs into the lungs every 6 (six) hours as needed., Disp: 1 g, Rfl: 3   montelukast  (SINGULAIR ) 10 MG tablet, Take 1 tablet (10 mg total) by mouth at bedtime., Disp: 90 tablet, Rfl: 1

## 2024-02-03 NOTE — Patient Instructions (Addendum)
 Continue wixella inhaler 1 puff twice daily - rinse mouth out after each use  Start incruse inhaler 1 puff daily  Continue to use airsupra  inhaler 1-2 puffs every 4-6 hours as needed  I will reach out to your sleep medicine team about starting Zepbound to help lose weight for your sleep apnea and other health benefits  Continue to work on diet and activity to help with weight loss  Follow up in 6 months

## 2024-02-11 ENCOUNTER — Encounter: Payer: Self-pay | Admitting: Pulmonary Disease

## 2024-02-12 ENCOUNTER — Telehealth: Payer: Self-pay

## 2024-02-12 NOTE — Telephone Encounter (Signed)
Please advise Mychart message.

## 2024-02-14 NOTE — Telephone Encounter (Signed)
 Dr. Gailen has not messaged me back . Please have patient reach out to her clinic to determine if she would be candidate for zepbound.  Thanks, JD

## 2024-02-15 NOTE — Telephone Encounter (Signed)
 Spoke w/ patient in regards to the zepbound, advised her to reach out to Dr. Lessie clinic if she would be candidate.    -NFN

## 2024-02-16 ENCOUNTER — Telehealth: Payer: Self-pay | Admitting: Neurology

## 2024-02-16 NOTE — Telephone Encounter (Signed)
 I called pt. Dr. Kara with Pulmonary (in [facility]) asking neuro recommendation on Zepbound for pt. Relating to her sleep apnea/obesity.   Last seen 04/2023.  Next appt 04/2024. Her last sleep study 2015.  I did relay if this is something that is recommended we do not order, defer to pcp.

## 2024-02-16 NOTE — Telephone Encounter (Signed)
 Pt is needing to discuss her sleep issues and a test she may qualify for due to being a sleep pt with the nurse. Please advise.

## 2024-02-16 NOTE — Telephone Encounter (Signed)
 What the patient needs is a clear diagnosis of moderate -severe obstructive sleep apnea for her primary physician to order Zepbound. We don't order the drug but can help to document the FDA approved diagnoses.   If her sleep study is from this calendar year and  dx is moderate=- severe  OSA, its valid for  ZEPBOUND .  If she had no sleep study since 2015 she would need one , RV with NP is fine, NP can  order sleep study.

## 2024-02-17 NOTE — Telephone Encounter (Signed)
 Needs OV with Megan NP.

## 2024-02-17 NOTE — Telephone Encounter (Signed)
 I called pt and she was driving, could not make appt then.  I told her I would hold appt with Megan NP for mychart VV 02-22-2024 at 1145. This would be so can get a sleep study ordered to see if qualifies for zebound relating to sleep. I have this on hold for her.  She will call back.  I relayed Dr. Lionell note below.

## 2024-02-22 ENCOUNTER — Encounter: Payer: Self-pay | Admitting: Family Medicine

## 2024-02-22 ENCOUNTER — Telehealth: Payer: Self-pay | Admitting: *Deleted

## 2024-02-22 ENCOUNTER — Encounter (HOSPITAL_BASED_OUTPATIENT_CLINIC_OR_DEPARTMENT_OTHER): Payer: Self-pay

## 2024-02-22 NOTE — Telephone Encounter (Signed)
 Copied from CRM [id number]. Topic: Clinical - Medical Advice >> Feb 17, 2024  3:37 PM Renee Flores wrote: Reason for CRM: Patient states Dr. Kara and her are discussing Zpebound options and her  neurologist states she can take the Zepbound shot but is concerned about the insurance covering the medication as it is typically only covered in severe cases.  Patient is requesting a call back from the nurse to discuss options. >> Feb 22, 2024 11:31 AM Renee Flores wrote: Patient called in on Wednesday 10/22 and no one called her back regarding Zepbound options, please patient back as she doesn't have an appointment until next year.   I called and spoke with patient, advised her that we do not manage her sleep apnea so she would have to contact Dr. Angelyn ofice and they would have to submit the information. She said when she had the initial sleep study it showed she had moderate sleep apnea and not severe sleep apnea.  She does not want to have to do another sleep study to have the Zepbound covered.  She said she also has diabetes.  I advised her to have Dr. Angelyn office and her PCP office work to get her to get documentation submitted to her insurance based on her sleep apnea and diabetes.  If that is not enough her insurance company may require that she have another sleep study done to see if it has gotten any worse.  She verbalized understanding.  Nothing further needed.

## 2024-02-22 NOTE — Telephone Encounter (Signed)
 Copied from CRM [id number]. Topic: Clinical - Medical Advice >> Feb 17, 2024  3:37 PM Renee Flores wrote: Reason for CRM: Patient states Dr. Kara and her are discussing Zpebound options and her  neurologist states she can take the Zepbound shot but is concerned about the insurance covering the medication as it is typically only covered in severe cases.  Patient is requesting a call back from the nurse to discuss options. >> Feb 22, 2024 11:31 AM Renee Flores wrote: Patient called in on Wednesday 10/22 and no one called her back regarding Zepbound options, please patient back as she doesn't have an appointment until next year.

## 2024-02-24 ENCOUNTER — Encounter (HOSPITAL_BASED_OUTPATIENT_CLINIC_OR_DEPARTMENT_OTHER): Payer: Self-pay | Admitting: Cardiology

## 2024-02-24 ENCOUNTER — Ambulatory Visit (HOSPITAL_BASED_OUTPATIENT_CLINIC_OR_DEPARTMENT_OTHER): Admitting: Cardiology

## 2024-02-24 VITALS — BP 134/86 | HR 74 | Ht 62.0 in | Wt 272.0 lb

## 2024-02-24 DIAGNOSIS — I10 Essential (primary) hypertension: Secondary | ICD-10-CM

## 2024-02-24 DIAGNOSIS — I700 Atherosclerosis of aorta: Secondary | ICD-10-CM

## 2024-02-24 DIAGNOSIS — E785 Hyperlipidemia, unspecified: Secondary | ICD-10-CM

## 2024-02-24 DIAGNOSIS — Z7189 Other specified counseling: Secondary | ICD-10-CM | POA: Diagnosis not present

## 2024-02-24 MED ORDER — SPIRONOLACTONE 25 MG PO TABS: 25.0000 mg | ORAL_TABLET | Freq: Every day | ORAL | 3 refills | Status: AC

## 2024-02-24 NOTE — Patient Instructions (Signed)
 Medication Instructions:  Your physician has recommended you make the following change in your medication:  1.) increase spironolactone  to 25 mg - take one tablet daily  *If you need a refill on your cardiac medications before your next appointment, please call your pharmacy*  Lab Work: Return in about 2 weeks for blood work:   If you have labs (blood work) drawn today and your tests are completely normal, you will receive your results only by: MyChart Message (if you have MyChart) OR A paper copy in the mail If you have any lab test that is abnormal or we need to change your treatment, we will call you to review the results.  Testing/Procedures: none  Follow-Up: At [facility], you and your health needs are our priority.  As part of our continuing mission to provide you with exceptional heart care, our providers are all part of one team.  This team includes your primary Cardiologist (physician) and Advanced Practice Providers or APPs (Physician Assistants and Nurse Practitioners) who all work together to provide you with the care you need, when you need it.  Your next appointment:   3 month(s)  Provider:   Shelda Bruckner, MD, Rosaline Bane, NP, or Reche Finder, NP

## 2024-02-24 NOTE — Progress Notes (Signed)
 Cardiology Office Note:  .   Date:  02/24/2024  ID:  Renee Flores, DOB 11/17/1965, MRN [id number] PCP: Mahlon Comer BRAVO, MD  [facility] Providers Cardiologist:  Shelda Bruckner, MD {  History of Present Illness: .   Renee Flores is a 58 y.o. female with PMH hypertension, dyslipidemia, palpitations, atypical chest pain, fatty liver. She was previously followed by Dr. Bernie and established care with me on 11/10/23.  Pertinent CV history: Myoview  2022, monitor from 2022, echo from 2021 personally reviewed.   Antihypertensive history:  -reports intolerance to amlodipine and thiazides in the past (see below) -initially on on losartan  50 mg BID, clonidine  0.1 mg TID, metoprolol  succinate 50 mg daily, spironolactone  25 mg daily. Changed from metoprolol  to carvedilol  -at follow up, endorsed some nausea, which she attributed to carvedilol , but continued. Losartan  changed to olmesartan   Today: Overall doing well. Working to cut back sugar. Going to gym. Pulmonologist recommended consideration of GLP given sleep apnea. Discussed with her sleep apnea doctor Dr. Chalice, but would need to repeat sleep study to see if she is moderate-severe OSA (last mild). We discussed GLPs today, side effects, recommendations on duration of use (lots of long term data for lifetime, but importance of weight based exercise if there is a plan to stop to try to lessen weight regain). Is prediabetic but has not formally been diagnosed with diabetes. BMI 49.   Blood pressures have been running 130s/80s at home, didn't bring log today. Tolerating medications well. Trying to improve salt use, staying active.  Still working on chronic cough with Dr. Kara, inhalers recently changed.  Walking on treadmill for 30-60 minutes, does weight machines at the gym 2-3 times/week.  ROS:  Denies chest pain, shortness of breath at rest or with normal exertion. No PND, orthopnea, LE edema or unexpected weight  gain. No syncope or palpitations. ROS otherwise negative except as noted.   Studies Reviewed: SABRA    EKG:       Physical Exam:   VS:  BP 134/86   Pulse 74   Ht 5' 2 (1.575 m)   Wt 272 lb (123.4 kg)   SpO2 96%   BMI 49.75 kg/m    Wt Readings from Last 3 Encounters:  02/24/24 272 lb (123.4 kg)  02/03/24 274 lb (124.3 kg)  12/29/23 272 lb (123.4 kg)    GEN: Well nourished, well developed in no acute distress HEENT: Normal, moist mucous membranes NECK: No JVD CARDIAC: regular rhythm, normal S1 and S2, no rubs or gallops. No murmur. VASCULAR: Radial and DP pulses 2+ bilaterally. No carotid bruits RESPIRATORY:  Clear to auscultation without rales, wheezing or rhonchi  ABDOMEN: Soft, non-tender, non-distended MUSCULOSKELETAL:  Ambulates independently SKIN: Warm and dry, no edema NEUROLOGIC:  Alert and oriented x 3. No focal neuro deficits noted. PSYCHIATRIC:  Normal affect    ASSESSMENT AND PLAN: .    Hypertension -currently on carvedilol  12.5 mg BID, olmesartan  40 mg daily, spironolactone  12.5 mg daily, clonidine  0.1 mg TID -discussed options, both lifestyle and medication changes. After discussion, she will continue to work on decreasing salt, and we will increase the spironolactone  to 25 mg daily -recheck BMET in about 2 weeks to monitor K/Cr -notes history of swelling on amlodipine, nausea/vomiting/muscle cramps on thiazide  Obesity, BMI 49 Fatty liver disease (ultrasound 2023) Aortic atherosclerosis (by CT) Hypercholesterolemia Prediabetes OSA: on CPAP -on aspirin  and statin. LDL goal <70, last 70 -we discussed GLP1 at length today, see above. This  would be a good choice for her if we can get it covered. She plans to discuss with Dr. Mahlon as well -last A1c 6.2 -reviewed lifestyle recommendations, below  CV risk counseling and prevention -recommend heart healthy/Mediterranean diet, with whole grains, fruits, vegetable, fish, lean meats, nuts, and olive oil. Limit  salt. -recommend moderate walking, 3-5 times/week for 30-50 minutes each session. Aim for at least 150 minutes.week. Goal should be pace of 3 miles/hours, or walking 1.5 miles in 30 minutes -recommend avoidance of tobacco products. Avoid excess alcohol.  Dispo: 3 months with me or APP  Signed, Shelda Bruckner, MD   Shelda Bruckner, MD, PhD, [facility]  [facility]  [place]  [facility] at [facility] at [facility] [address] [place], [place] [address] [phone]

## 2024-03-06 ENCOUNTER — Ambulatory Visit

## 2024-03-06 ENCOUNTER — Ambulatory Visit
Admission: EM | Admit: 2024-03-06 | Discharge: 2024-03-06 | Disposition: A | Discharge status: Home / Self Care | Attending: Nurse Practitioner | Admitting: Nurse Practitioner

## 2024-03-06 DIAGNOSIS — Z8709 Personal history of other diseases of the respiratory system: Secondary | ICD-10-CM | POA: Diagnosis not present

## 2024-03-06 DIAGNOSIS — R0602 Shortness of breath: Secondary | ICD-10-CM

## 2024-03-06 DIAGNOSIS — J449 Chronic obstructive pulmonary disease, unspecified: Secondary | ICD-10-CM | POA: Diagnosis not present

## 2024-03-06 DIAGNOSIS — J069 Acute upper respiratory infection, unspecified: Secondary | ICD-10-CM

## 2024-03-06 LAB — POC SOFIA SARS ANTIGEN FIA: SARS Coronavirus 2 Ag: NEGATIVE

## 2024-03-06 MED ORDER — PROMETHAZINE-DM 6.25-15 MG/5ML PO SYRP: 5.0000 mL | ORAL_SOLUTION | Freq: Four times a day (QID) | ORAL | 0 refills | Status: AC | PRN

## 2024-03-06 MED ORDER — METHYLPREDNISOLONE SODIUM SUCC 125 MG IJ SOLR
125.0000 mg | Freq: Once | INTRAMUSCULAR | Status: AC
  Administered 2024-03-06: 125 mg via INTRAMUSCULAR

## 2024-03-06 MED ORDER — AMOXICILLIN-POT CLAVULANATE 875-125 MG PO TABS: 1.0000 | ORAL_TABLET | Freq: Two times a day (BID) | ORAL | 0 refills | Status: DC

## 2024-03-06 MED ORDER — PREDNISONE 20 MG PO TABS: 20.0000 mg | ORAL_TABLET | Freq: Every day | ORAL | 0 refills | Status: AC

## 2024-03-06 MED ORDER — IPRATROPIUM-ALBUTEROL 0.5-2.5 (3) MG/3ML IN SOLN
3.0000 mL | Freq: Once | RESPIRATORY_TRACT | Status: AC
  Administered 2024-03-06: 3 mL via RESPIRATORY_TRACT

## 2024-03-06 NOTE — Discharge Instructions (Signed)
 The chest x-ray was negative. You were given an injection of Solu-Medrol  125 mg and a duo nebulizer treatment.  Start the prednisone  tomorrow. Take medication as prescribed. Continue your current asthma and allergy  regimen. Increase fluids and allow for plenty of rest. You may take over-the-counter Tylenol  as needed for pain, fever, or general discomfort. Recommend the use of a humidifier in your bedroom at nighttime during sleep and sleeping elevated on pillows while symptoms persist. Try to avoid asthma and allergy  triggers while symptoms persist. Go to the emergency department if you experience shortness of breath, difficulty breathing, or other concerns. I would like for you to follow-up with your pulmonologist within the next 7 to 10 days for reevaluation. Follow-up as needed.

## 2024-03-06 NOTE — ED Triage Notes (Signed)
 Pt reports she has a cough with mucus, SHOB, wheezing, and chest discomfort x 4 days    Used inhalers, tussin hbp

## 2024-03-06 NOTE — ED Notes (Signed)
 Upon ambulation test pt SP02 ranged from 93-94%. Provider notified.

## 2024-03-06 NOTE — ED Provider Notes (Signed)
 [facility]    CSN: [id number] Arrival date & time: 03/06/24  1005      History   Chief Complaint Chief Complaint  Patient presents with   Cough    HPI Renee Flores is a 58 y.o. female.   The history is provided by the patient.   Patient presents with a 4-day history of cough, chest congestion, wheezing, and shortness of breath.  States that she feels a heaviness with her breathing.  Patient denies fever, chills, ear pain, difficulty breathing, abdominal pain, nausea, vomiting, diarrhea, or rash.  Patient reports history of COPD and asthma.  She states that she recently started seeing pulmonology and was diagnosed with asthma.  She states that she has been using inhalers prescribed by her pulmonologist along with tests and cough medicine for her symptoms.  Patient states that she was outside for several hours last evening, which caused her symptoms to worsen.  Past Medical History:  Diagnosis Date   Allergy     Arthritis    Asthma    Bulging lumbar disc    L4-5   Complication of anesthesia    anxiety attack after waking up from anesthesia   Factor 5 Leiden mutation, heterozygous    Factor V deficiency, congenital (HCC) 03/23/2012   GERD (gastroesophageal reflux disease)    Heart murmur    Heart palpitations    Heart valve problem    mild- moderate mitral valve leakage    High cholesterol    HYPERCHOLESTEROLEMIA 02/27/2010   Qualifier: Diagnosis of  By: Lutricia RN, Denise     Hypersomnia with sleep apnea, unspecified 11/03/2013   Hypertension    Lumbar radiculopathy 08/12/2018   MS (multiple sclerosis)    questionable   Neuromuscular disorder (HCC)    OSA on CPAP 03/25/2017   Osteopenia 04/2016   T score -1.9   Osteoporosis    Preop cardiovascular exam 03/23/2012   Retrognathia 11/03/2013   Right knee DJD 07/08/2012   Severe obesity (BMI >= 40) (HCC) 11/03/2013   Shortness of breath 06/03/2013   Sleep apnea    Vitamin D  deficiency 2014   17     Patient Active Problem List   Diagnosis Date Noted   Asthma 07/15/2022   Allergic rhinitis 07/15/2022   Fatty liver 12/31/2021   Atypical chest pain 09/19/2020   Palpitations    Osteoporosis    MS (multiple sclerosis)    Heart valve problem    Heart murmur    GERD (gastroesophageal reflux disease)    Factor 5 Leiden mutation, heterozygous    Complication of anesthesia    Bulging lumbar disc    Arthritis    Hypertension 04/13/2020   Lumbar radiculopathy 08/12/2018   OSA on CPAP 03/25/2017   Hypersomnia with sleep apnea 11/03/2013   Severe obesity (BMI >= 40) (HCC) 11/03/2013   Retrognathia 11/03/2013   Shortness of breath 06/03/2013   Right knee DJD 07/08/2012    Class: Chronic   Physical exam 03/23/2012   Factor V deficiency, congenital (HCC) 03/23/2012   Vitamin D  deficiency    HYPERCHOLESTEROLEMIA 02/27/2010    Past Surgical History:  Procedure Laterality Date   ABDOMINAL HYSTERECTOMY     ANKLE FUSION Left 04/25/2021   APPENDECTOMY     arthroscopic knee surgery     CESAREAN SECTION     x2   chloecystectomy     CHOLECYSTECTOMY     ESOPHAGEAL DILATION  04/09/2012   FRACTURE SURGERY  2007   Knee cap  JOINT REPLACEMENT  2012/2014   Both knee's   KNEE LIGAMENT RECONSTRUCTION     laser throat polyps     Left ankle reconstructed     NECK LESION BIOPSY     PATELLA FRACTURE SURGERY     PELVIC LAPAROSCOPY     LSO   REPLACEMENT TOTAL KNEE Left 2012   REPLACEMENT TOTAL KNEE Right 2014   TOE SURGERY     TONSILLECTOMY     TONSILLECTOMY  1979   TOTAL ABDOMINAL HYSTERECTOMY W/ BILATERAL SALPINGOOPHORECTOMY  2000   TOTAL KNEE ARTHROPLASTY Left 2012   TOTAL KNEE ARTHROPLASTY Right 07/08/2012   Procedure: TOTAL KNEE ARTHROPLASTY with revision components ;  Surgeon: Maude KANDICE Herald, MD;  Location: [facility] OR;  Service: Orthopedics;  Laterality: Right;  PATIENT HAS HX. OF FACTOR IV DISORFER   TUBAL LIGATION      OB History     Gravida  2   Para  2   Term  2    Preterm      AB      Living  2      SAB      IAB      Ectopic      Multiple      Live Births               Home Medications    Prior to Admission medications   Medication Sig Start Date End Date Taking? Authorizing Provider  amoxicillin -clavulanate (AUGMENTIN ) 875-125 MG tablet Take 1 tablet by mouth every 12 (twelve) hours. 03/06/24  Yes Leath-Warren, Etta PARAS, NP  predniSONE  (DELTASONE ) 20 MG tablet Take 1 tablet (20 mg total) by mouth daily with breakfast for 7 days. 03/06/24 03/13/24 Yes Leath-Warren, Etta PARAS, NP  promethazine -dextromethorphan (PROMETHAZINE -DM) 6.25-15 MG/5ML syrup Take 5 mLs by mouth 4 (four) times daily as needed. 03/06/24  Yes Leath-Warren, Etta PARAS, NP  Albuterol -Budesonide (AIRSUPRA ) 90-80 MCG/ACT AERO Inhale 2 puffs into the lungs every 6 (six) hours as needed. 02/03/24   Kara Dorn NOVAK, MD  aspirin  EC 81 MG tablet Take 81 mg by mouth daily.    [provider]  Azelastine  HCl 137 MCG/SPRAY SOLN INSTILL 1-2 SPRAYS INTO BOTH NOSTRILS 2 (TWO) TIMES DAILY AS NEEDED (NASAL DRAINAGE). 10/23/23   Luke Orlan HERO, DO  CALCIUM  MAGNESIUM ZINC PO Take 1 tablet by mouth daily in the afternoon.    [provider]  carvedilol  (COREG ) 12.5 MG tablet Take 1 tablet (12.5 mg total) by mouth 2 (two) times daily. 11/10/23 02/24/24  Lonni Slain, MD  Cholecalciferol (VITAMIN D3) 50 MCG (2000 UT) TABS  10/27/22   [provider]  cloNIDine  (CATAPRES ) 0.1 MG tablet Take 1 tablet (0.1 mg total) by mouth 3 (three) times daily. 04/21/23   Tabori, Katherine E, MD  cyclobenzaprine  (FLEXERIL ) 5 MG tablet Take 1 tablet (5 mg total) by mouth 3 (three) times daily as needed for muscle spasms. 03/01/21   Kip Ade, NP  fluticasone  (FLONASE ) 50 MCG/ACT nasal spray Place 2 sprays into both nostrils daily. 08/06/20   Tabori, Katherine E, MD  gabapentin  (NEURONTIN ) 300 MG capsule Take 2 capsules (600 mg total) by mouth at bedtime. 02/01/24    Tabori, Katherine E, MD  ketotifen  (ZADITOR ) 0.025 % ophthalmic solution Place 2 drops into both eyes daily as needed (for irritation). 04/16/20   Tabori, Katherine E, MD  meloxicam  (MOBIC ) 15 MG tablet Take 1 tablet (15 mg total) by mouth daily. 12/23/23   Tabori, Katherine E, MD  montelukast  (SINGULAIR ) 10 MG tablet Take 1 tablet (10 mg total) by mouth at bedtime. 02/03/24   Kara Dorn NOVAK, MD  Multiple Vitamins-Minerals (ONE-A-DAY WOMENS 50+ ADVANTAGE PO) Take 1 tablet by mouth daily. Unknown strength    [provider]  olmesartan  (BENICAR ) 40 MG tablet TAKE 1 TABLET BY MOUTH EVERY DAY 01/22/24   Swinyer, Rosaline HERO, NP  omeprazole  (PRILOSEC) 40 MG capsule Take 1 capsule (40 mg total) by mouth daily. 06/09/23   Tabori, Katherine E, MD  ondansetron  (ZOFRAN ) 4 MG tablet Take 1 tablet (4 mg total) by mouth every 8 (eight) hours as needed for nausea or vomiting. 02/25/21   Kip Ade, NP  simvastatin  (ZOCOR ) 40 MG tablet TAKE 1 TABLET BY MOUTH EVERYDAY AT BEDTIME 12/30/23   Tabori, Katherine E, MD  spironolactone  (ALDACTONE ) 25 MG tablet Take 1 tablet (25 mg total) by mouth daily. 02/24/24   Lonni Slain, MD  umeclidinium bromide (INCRUSE ELLIPTA) 62.5 MCG/ACT AEPB Inhale 1 puff into the lungs daily. 02/03/24   Kara Dorn NOVAK, MD  WIXELA INHUB 250-50 MCG/ACT AEPB INHALE 1 PUFF INTO THE LUNGS IN THE MORNING AND AT BEDTIME. 11/20/23   Parrett, Madelin RAMAN, NP    Family History Family History  Problem Relation Age of Onset   Heart disease Father    Hypertension Father    Hyperlipidemia Father    Cancer Father    Other Father        Respiratory problems/Factor 5 disorder/1 bypass, 2 valve replacement (mitral and aortic)    Arthritis Father    Diabetes Father    Hearing loss Father    Obesity Father    Hypertension Mother    Diabetes Mother    Hyperlipidemia Mother    Arthritis Mother    Heart disease Mother    Obesity Mother    Varicose Veins Mother    Hypertension  Sister    Colon cancer Paternal Aunt    Breast cancer Paternal Aunt 47   Cancer Paternal Aunt    Heart disease Maternal Grandfather    Lymphoma Maternal Grandfather    Heart failure Maternal Grandfather    Cancer Maternal Grandfather    Hearing loss Maternal Grandfather    Colon cancer Paternal Grandmother    Arthritis Paternal Grandmother    Kidney disease Paternal Grandmother    Obesity Paternal Grandmother    Hyperlipidemia Brother    Hypertension Brother    Diabetes Brother    Obesity Brother    Hypertension Brother    Obesity Brother    Colon cancer Paternal Aunt    Breast cancer Paternal Aunt 81   Stroke Paternal Grandfather    Arthritis Paternal Grandfather    Cancer Maternal Grandmother    Miscarriages / Stillbirths Maternal Grandmother    ADD / ADHD Son    Arthritis Son    Birth defects Son    Hearing loss Son    ADD / ADHD Son    Anxiety disorder Son    Depression Son    Arthritis Paternal Aunt    Diabetes Maternal Uncle    Obesity Maternal Uncle    Diabetes Maternal Uncle     Social History Social History   Tobacco Use   Smoking status: Never    Passive exposure: Never   Smokeless tobacco: Never  Vaping Use   Vaping status: Never Used  Substance Use Topics   Alcohol use: Not Currently   Drug use: No     Allergies   Hydrochlorothiazide ,  Morphine, Vancomycin , Erythromycin base, Erythromycin base, and Doxycycline    Review of Systems Review of Systems Per HPI  Physical Exam Triage Vital Signs ED Triage Vitals  Encounter Vitals Group     BP 03/06/24 1021 131/85     Girls Systolic BP Percentile --      Girls Diastolic BP Percentile --      Boys Systolic BP Percentile --      Boys Diastolic BP Percentile --      Pulse Rate 03/06/24 1021 73     Resp 03/06/24 1021 20     Temp 03/06/24 1021 98.5 F (36.9 C)     Temp Source 03/06/24 1021 Oral     SpO2 03/06/24 1021 90 %     Weight --      Height --      Head Circumference --      Peak  Flow --      Pain Score 03/06/24 1020 5     Pain Loc --      Pain Education --      Exclude from Growth Chart --    No data found.  Updated Vital Signs BP 131/85 (BP Location: Right Arm)   Pulse 73   Temp 98.5 F (36.9 C) (Oral)   Resp 20   SpO2 (S) 93% Comment: after neb tx  Visual Acuity Right Eye Distance:   Left Eye Distance:   Bilateral Distance:    Right Eye Near:   Left Eye Near:    Bilateral Near:     Physical Exam Vitals and nursing note reviewed.  Constitutional:      General: She is not in acute distress.    Appearance: Normal appearance.  HENT:     Head: Normocephalic.     Right Ear: Tympanic membrane, ear canal and external ear normal.     Left Ear: Tympanic membrane, ear canal and external ear normal.     Nose: Congestion present.     Mouth/Throat:     Mouth: Mucous membranes are moist.  Eyes:     Extraocular Movements: Extraocular movements intact.     Conjunctiva/sclera: Conjunctivae normal.     Pupils: Pupils are equal, round, and reactive to light.  Cardiovascular:     Rate and Rhythm: Normal rate and regular rhythm.     Pulses: Normal pulses.     Heart sounds: Normal heart sounds.  Pulmonary:     Effort: Pulmonary effort is normal. No respiratory distress.     Breath sounds: Normal breath sounds. No stridor. No wheezing, rhonchi or rales.  Abdominal:     General: Bowel sounds are normal.     Palpations: Abdomen is soft.  Musculoskeletal:     Cervical back: Normal range of motion.  Skin:    General: Skin is warm and dry.  Neurological:     General: No focal deficit present.     Mental Status: She is alert and oriented to person, place, and time.  Psychiatric:        Mood and Affect: Mood normal.        Behavior: Behavior normal.      UC Treatments / Results  Labs (all labs ordered are listed, but only abnormal results are displayed) Labs Reviewed  POC SOFIA SARS ANTIGEN FIA    EKG   Radiology DG Chest 2 View Result Date:  03/06/2024 EXAM: 2 VIEW(S) XRAY OF THE CHEST 03/06/2024 10:57:12 AM COMPARISON: 05/12/2023 CLINICAL HISTORY: shortness of breath, low o2 sat, hx  of COPD FINDINGS: LUNGS AND PLEURA: No focal pulmonary opacity. No pulmonary edema. No pleural effusion. No pneumothorax. HEART AND MEDIASTINUM: No acute abnormality of the cardiac and mediastinal silhouettes. BONES AND SOFT TISSUES: No acute osseous abnormality. IMPRESSION: 1. No acute cardiopulmonary process identified. Electronically signed by: Waddell Calk MD 03/06/2024 11:20 AM EST RP Workstation: [id number]    Procedures Procedures (including critical care time)  Medications Ordered in UC Medications  ipratropium-albuterol  (DUONEB) 0.5-2.5 (3) MG/3ML nebulizer solution 3 mL (3 mLs Nebulization Given 03/06/24 1041)  methylPREDNISolone  sodium succinate (SOLU-MEDROL ) 125 mg/2 mL injection 125 mg (125 mg Intramuscular Given 03/06/24 1041)    Initial Impression / Assessment and Plan / UC Course  I have reviewed the triage vital signs and the nursing notes.  Pertinent labs & imaging results that were available during my care of the patient were reviewed by me and considered in my medical decision making (see chart for details).  On initial exam, the patient's lung sounds were clear throughout, DuoNeb was administered due to complaints of chest heaviness.  Solu-Medrol  125 mg IM was also administered for bronchial inflammation and for possible asthma exacerbation.  Post DuoNeb, sats increased to 93%.  She was noted to have sats between 93 to 95% during her exam.  Chest x-ray did not show any active cardiopulmonary disease.  COVID test was negative.  Symptoms consistent with possible asthma/COPD exacerbation.  Will treat with a low-dose of prednisone  20 mg for the next 7 days and start Augmentin  875/2025 mg to cover for COPD exacerbation.  Patient advised to continue her current asthma regimen.  Promethazine  DM was also prescribed for the cough.  Supportive  care recommendations were provided and discussed with the patient to include avoiding asthma triggers, fluids, and rest.  Patient was given strict ER follow-up precautions, along with recommendations to follow-up with her pulmonologist for further evaluation.  Patient was in agreement with this plan of care and verbalizes understanding.  All questions were answered.  Patient stable for discharge.  Final Clinical Impressions(s) / UC Diagnoses   Final diagnoses:  Shortness of breath  Viral URI with cough  History of asthma  History of COPD  History of allergic rhinitis     Discharge Instructions      The chest x-ray was negative. You were given an injection of Solu-Medrol  125 mg and a duo nebulizer treatment.  Start the prednisone  tomorrow. Take medication as prescribed. Continue your current asthma and allergy  regimen. Increase fluids and allow for plenty of rest. You may take over-the-counter Tylenol  as needed for pain, fever, or general discomfort. Recommend the use of a humidifier in your bedroom at nighttime during sleep and sleeping elevated on pillows while symptoms persist. Try to avoid asthma and allergy  triggers while symptoms persist. Go to the emergency department if you experience shortness of breath, difficulty breathing, or other concerns. I would like for you to follow-up with your pulmonologist within the next 7 to 10 days for reevaluation. Follow-up as needed.     ED Prescriptions     Medication Sig Dispense Auth. Provider   predniSONE  (DELTASONE ) 20 MG tablet Take 1 tablet (20 mg total) by mouth daily with breakfast for 7 days. 7 tablet Leath-Warren, Etta PARAS, NP   amoxicillin -clavulanate (AUGMENTIN ) 875-125 MG tablet Take 1 tablet by mouth every 12 (twelve) hours. 14 tablet Leath-Warren, Etta PARAS, NP   promethazine -dextromethorphan (PROMETHAZINE -DM) 6.25-15 MG/5ML syrup Take 5 mLs by mouth 4 (four) times daily as needed. 118 mL Leath-Warren, Etta PARAS,  NP       PDMP not reviewed this encounter.   Gilmer Etta PARAS, NP 03/06/24 1150

## 2024-03-14 LAB — BASIC METABOLIC PANEL WITH GFR
BUN/Creatinine Ratio: 18 (ref 9–23)
BUN: 14 mg/dL (ref 6–24)
CO2: 23 mmol/L (ref 20–29)
Calcium: 9.5 mg/dL (ref 8.7–10.2)
Chloride: 99 mmol/L (ref 96–106)
Creatinine, Ser: 0.79 mg/dL (ref 0.57–1.00)
Glucose: 103 mg/dL — ABNORMAL HIGH (ref 70–99)
Potassium: 4 mmol/L (ref 3.5–5.2)
Sodium: 138 mmol/L (ref 134–144)
eGFR: 87 mL/min/1.73 (ref >59)

## 2024-03-15 ENCOUNTER — Ambulatory Visit (HOSPITAL_BASED_OUTPATIENT_CLINIC_OR_DEPARTMENT_OTHER): Payer: Self-pay | Admitting: Family

## 2024-03-17 ENCOUNTER — Other Ambulatory Visit (HOSPITAL_COMMUNITY): Payer: Self-pay

## 2024-03-17 ENCOUNTER — Encounter: Payer: Self-pay | Admitting: Family Medicine

## 2024-03-17 ENCOUNTER — Telehealth: Payer: Self-pay | Admitting: Family Medicine

## 2024-03-17 ENCOUNTER — Other Ambulatory Visit: Payer: Self-pay | Admitting: Family Medicine

## 2024-03-17 ENCOUNTER — Ambulatory Visit (INDEPENDENT_AMBULATORY_CARE_PROVIDER_SITE_OTHER): Admitting: Family Medicine

## 2024-03-17 VITALS — BP 124/78 | HR 78 | Temp 98.0°F | Ht 62.0 in | Wt 269.2 lb

## 2024-03-17 DIAGNOSIS — G4733 Obstructive sleep apnea (adult) (pediatric): Secondary | ICD-10-CM | POA: Diagnosis not present

## 2024-03-17 MED ORDER — TIRZEPATIDE-WEIGHT MANAGEMENT 2.5 MG/0.5ML ~~LOC~~ SOLN: 2.5000 mg | SUBCUTANEOUS | 1 refills | Status: DC

## 2024-03-17 NOTE — Telephone Encounter (Signed)
 I apologize, I see that in the notes now.   I called patient and left vm to return call. Please see Dr. Charis message below

## 2024-03-17 NOTE — Telephone Encounter (Signed)
 Patient will need a new sleep study for Zepbound to possibly be covered.Renee Flores

## 2024-03-17 NOTE — Telephone Encounter (Signed)
 Are we able to submit a PA for this patient?

## 2024-03-17 NOTE — Telephone Encounter (Signed)
 Copied from CRM #[id number]. Topic: General - Call Back - No Documentation >> Mar 17, 2024  4:18 PM Rea ORN wrote: Reason for CRM: Pt returned missed call from [facility]. I advised of PCP note regarding sleep study. Pt will call Dr. Lionell office.

## 2024-03-17 NOTE — Patient Instructions (Signed)
 Follow up as needed or as scheduled I sent in the prescription for the Zepbound  and we'll see if we can get it approved If it is denied, we will need to repeat the sleep studies w/ Dr Dohmeier Call with any questions or concerns Stay Safe!  Stay Healthy! Happy Holidays!!!

## 2024-03-17 NOTE — Progress Notes (Signed)
   Subjective:    Patient ID: Renee Flores, female    DOB: 1965-11-25, 58 y.o.   MRN: [id number]  HPI Obesity- pt saw pulmonary who recommended starting Zepbound for weight loss due to her dx of OSA on CPAP (following w/ Dr Chalice).  Dr Dohmeier told pt that she needed to repeat sleep study b/c last one was 2015 and showed mild OSA w/ hypoxia.  Pt reports that she weighed less in 2015- 220 vs 270 lbs.   Review of Systems For ROS see HPI     Objective:   Physical Exam Vitals reviewed.  Constitutional:      General: She is not in acute distress.    Appearance: Normal appearance. She is obese. She is not ill-appearing.  HENT:     Head: Normocephalic and atraumatic.  Eyes:     Extraocular Movements: Extraocular movements intact.     Conjunctiva/sclera: Conjunctivae normal.  Cardiovascular:     Rate and Rhythm: Normal rate and regular rhythm.  Pulmonary:     Effort: Pulmonary effort is normal.  Skin:    General: Skin is warm and dry.  Neurological:     General: No focal deficit present.     Mental Status: She is alert and oriented to person, place, and time.  Psychiatric:        Mood and Affect: Mood normal.        Behavior: Behavior normal.        Thought Content: Thought content normal.           Assessment & Plan:  Obesity- ongoing issue.  Pulmonary recommended starting Zepbound due to BMI of 49 and OSA on CPAP.  Unfortunately, the last sleep study was 2015 and results indicated mild OSA w/ hypoxia.  Per Zepbound criteria, OSA needs to be moderate to severe.  Will attempt to get medication approved but she will likely need to repeat sleep studies w/ Dr Chalice.  Pt expressed understanding and is in agreement w/ plan.

## 2024-03-17 NOTE — Telephone Encounter (Signed)
 See documentation below  Copied from CRM #[id number]. Topic: Clinical - Prescription Issue >> Mar 17, 2024  4:31 PM Nessti S wrote: Reason for CRM: pt called because insurance does not cover zepbound  but only ozempic and mounjaro . Call back number [phone]

## 2024-03-18 ENCOUNTER — Other Ambulatory Visit (HOSPITAL_COMMUNITY): Payer: Self-pay

## 2024-03-18 ENCOUNTER — Other Ambulatory Visit: Payer: Self-pay | Admitting: Family Medicine

## 2024-03-18 NOTE — Telephone Encounter (Signed)
 See mychart messages

## 2024-03-21 NOTE — Telephone Encounter (Signed)
 Patient responded. He has sleep apnea and thinks his insurance will cover mounjaro .

## 2024-03-22 ENCOUNTER — Telehealth: Payer: Self-pay

## 2024-03-22 MED ORDER — TIRZEPATIDE 2.5 MG/0.5ML ~~LOC~~ SOAJ: 2.5000 mg | SUBCUTANEOUS | 1 refills | Status: AC

## 2024-03-22 NOTE — Addendum Note (Signed)
 Addended by: Labradford Schnitker E on: 03/22/2024 07:45 AM   Modules accepted: Orders

## 2024-03-22 NOTE — Telephone Encounter (Signed)
 Ozempic/Mounjaro  is approved exclusively as an adjunct to diet and exercise to improve glycemic  control in adults with type 2 diabetes mellitus. A review of patient's medical chart reveals no  documented diagnosis of type 2 diabetes or an A1C of 6.5% indicative of diabetes. Therefore, they do not  currently meet the criteria for prior authorization of this medication.

## 2024-03-22 NOTE — Telephone Encounter (Signed)
 Patient called Clinical Pharmacist to ask about getting Mounjaro  / Zepbound .  Patient has multiple co morbidities that would benefit from use of Mounjaro  / Zepbound  but will need to make sure she qualifies.  Currently [employer] does not cover Mounjaro  unless patient has type 2 DM / A1c of 6.5% or higher.  They do not cover Zepbound  for weight loss / obesity.  They do sometimes cover Zepbound  for moderate to severe OSA but the only sleep study I could find was from 2015 and noted only mild OSA.   Reviewed for possible conditions that would possibly allow for [facility] use - no past history of PAD, stroke or MI.   Will touch base with PCP. Could consider ordering sleep study to reassess OSA.

## 2024-03-22 NOTE — Telephone Encounter (Signed)
 Called pt and lvm  Please advise pt she will need to do further testing for medication to be approved

## 2024-03-23 NOTE — Telephone Encounter (Signed)
 Pt has been notified.

## 2024-03-28 ENCOUNTER — Ambulatory Visit (INDEPENDENT_AMBULATORY_CARE_PROVIDER_SITE_OTHER): Admitting: Family Medicine

## 2024-03-28 ENCOUNTER — Encounter: Payer: Self-pay | Admitting: Family Medicine

## 2024-03-28 VITALS — BP 124/70 | HR 66 | Temp 97.6°F | Ht 62.0 in | Wt 270.2 lb

## 2024-03-28 DIAGNOSIS — R7303 Prediabetes: Secondary | ICD-10-CM | POA: Insufficient documentation

## 2024-03-28 DIAGNOSIS — E7800 Pure hypercholesterolemia, unspecified: Secondary | ICD-10-CM | POA: Diagnosis not present

## 2024-03-28 DIAGNOSIS — I10 Essential (primary) hypertension: Secondary | ICD-10-CM

## 2024-03-28 LAB — BASIC METABOLIC PANEL WITH GFR
BUN: 13 mg/dL (ref 6–23)
CO2: 27 meq/L (ref 19–32)
Calcium: 9.4 mg/dL (ref 8.4–10.5)
Chloride: 103 meq/L (ref 96–112)
Creatinine, Ser: 0.75 mg/dL (ref 0.40–1.20)
GFR: 88.4 mL/min
Glucose, Bld: 112 mg/dL — ABNORMAL HIGH (ref 70–99)
Potassium: 3.8 meq/L (ref 3.5–5.1)
Sodium: 139 meq/L (ref 135–145)

## 2024-03-28 LAB — HEPATIC FUNCTION PANEL
ALT: 37 U/L — ABNORMAL HIGH (ref 0–35)
AST: 28 U/L (ref 0–37)
Albumin: 4.3 g/dL (ref 3.5–5.2)
Alkaline Phosphatase: 56 U/L (ref 39–117)
Bilirubin, Direct: 0.1 mg/dL (ref 0.0–0.3)
Total Bilirubin: 0.6 mg/dL (ref 0.2–1.2)
Total Protein: 7.1 g/dL (ref 6.0–8.3)

## 2024-03-28 LAB — HEMOGLOBIN A1C: Hgb A1c MFr Bld: 6 % (ref 4.6–6.5)

## 2024-03-28 LAB — TSH: TSH: 2.78 u[IU]/mL (ref 0.35–5.50)

## 2024-03-28 LAB — LIPID PANEL
Cholesterol: 133 mg/dL (ref 0–200)
HDL: 51 mg/dL
LDL Cholesterol: 43 mg/dL (ref 0–99)
NonHDL: 82.32
Total CHOL/HDL Ratio: 3
Triglycerides: 196 mg/dL — ABNORMAL HIGH (ref 0.0–149.0)
VLDL: 39.2 mg/dL (ref 0.0–40.0)

## 2024-03-28 LAB — CBC WITH DIFFERENTIAL/PLATELET
Basophils Absolute: 0.1 K/uL (ref 0.0–0.1)
Basophils Relative: 1.1 % (ref 0.0–3.0)
Eosinophils Absolute: 0.6 K/uL (ref 0.0–0.7)
Eosinophils Relative: 9.2 % — ABNORMAL HIGH (ref 0.0–5.0)
HCT: 39 % (ref 36.0–46.0)
Hemoglobin: 12.9 g/dL (ref 12.0–15.0)
Lymphocytes Relative: 41.6 % (ref 12.0–46.0)
Lymphs Abs: 2.8 K/uL (ref 0.7–4.0)
MCHC: 33.1 g/dL (ref 30.0–36.0)
MCV: 87.5 fl (ref 78.0–100.0)
Monocytes Absolute: 0.5 K/uL (ref 0.1–1.0)
Monocytes Relative: 6.8 % (ref 3.0–12.0)
Neutro Abs: 2.8 K/uL (ref 1.4–7.7)
Neutrophils Relative %: 41.3 % — ABNORMAL LOW (ref 43.0–77.0)
Platelets: 199 K/uL (ref 150.0–400.0)
RBC: 4.46 Mil/uL (ref 3.87–5.11)
RDW: 13.4 % (ref 11.5–15.5)
WBC: 6.8 K/uL (ref 4.0–10.5)

## 2024-03-28 NOTE — Progress Notes (Signed)
   Subjective:    Patient ID: Renee Flores, female    DOB: 1965/07/16, 58 y.o.   MRN: [id number]  HPI HTN- chronic problem, on Coreg  12.5mg  BID, Clonidine  0.1mg  TID, Olmesartan  40mg  daily, Spironolactone  25mg  daily w/ good control.  Denies CP, SOB above baseline, HA's, visual changes, edema.  Hyperlipidemia- chronic problem, on Simvastatin  40mg  daily.  Denies abd pain, N/V.  Pre-diabetes- last A1C 6.2%.  We attempted to start GLP1 but this is not covered by pt's insurance.  Attempting to exercise but is struggling w/ consistency.   Review of Systems For ROS see HPI     Objective:   Physical Exam Vitals reviewed.  Constitutional:      General: She is not in acute distress.    Appearance: Normal appearance. She is well-developed. She is obese. She is not ill-appearing.  HENT:     Head: Normocephalic and atraumatic.  Eyes:     Conjunctiva/sclera: Conjunctivae normal.     Pupils: Pupils are equal, round, and reactive to light.  Neck:     Thyroid : No thyromegaly.  Cardiovascular:     Rate and Rhythm: Normal rate and regular rhythm.     Pulses: Normal pulses.     Heart sounds: Normal heart sounds. No murmur heard. Pulmonary:     Effort: Pulmonary effort is normal. No respiratory distress.     Breath sounds: Normal breath sounds.  Abdominal:     General: There is no distension.     Palpations: Abdomen is soft.     Tenderness: There is no abdominal tenderness.  Musculoskeletal:     Cervical back: Normal range of motion and neck supple.     Right lower leg: No edema.     Left lower leg: No edema.  Lymphadenopathy:     Cervical: No cervical adenopathy.  Skin:    General: Skin is warm and dry.  Neurological:     General: No focal deficit present.     Mental Status: She is alert and oriented to person, place, and time.  Psychiatric:        Mood and Affect: Mood normal.        Behavior: Behavior normal.        Thought Content: Thought content normal.            Assessment & Plan:

## 2024-03-28 NOTE — Patient Instructions (Signed)
Schedule your complete physical in 6 months We'll notify you of your lab results and make any changes if needed Keep up the good work on healthy diet and regular exercise- you're doing great! Call with any questions or concerns Stay Safe!  Stay Healthy!! Happy Holidays!!!

## 2024-03-28 NOTE — Assessment & Plan Note (Signed)
 Chronic problem.  On Coreg , Clonidine , Olmesartan , and Spironolactone  w/ good BP control.  Currently asymptomatic.  Check labs due to ARB and Spironolactone  use but no anticipated med changes.  Will follow.

## 2024-03-28 NOTE — Assessment & Plan Note (Signed)
 Chronic problem.  On Simvastatin  40mg  daily w/o difficulty.  Check labs.  Adjust meds prn

## 2024-03-28 NOTE — Assessment & Plan Note (Signed)
 Ongoing issue for pt.  Last A1C 6.2%.  We attempted to start GLP 1 but this was denied by insurance.  She is attempting to exercise but is struggling w/ consistency.  Check labs.  Start tx prn.

## 2024-03-29 ENCOUNTER — Ambulatory Visit: Payer: Self-pay | Admitting: Family Medicine

## 2024-03-31 ENCOUNTER — Other Ambulatory Visit: Payer: Self-pay

## 2024-03-31 ENCOUNTER — Ambulatory Visit: Admitting: Allergy

## 2024-03-31 ENCOUNTER — Encounter: Payer: Self-pay | Admitting: Allergy

## 2024-03-31 ENCOUNTER — Telehealth: Payer: Self-pay | Admitting: *Deleted

## 2024-03-31 VITALS — BP 134/96 | HR 80 | Temp 97.9°F | Resp 18 | Ht 62.0 in | Wt 267.4 lb

## 2024-03-31 DIAGNOSIS — J4540 Moderate persistent asthma, uncomplicated: Secondary | ICD-10-CM | POA: Diagnosis not present

## 2024-03-31 DIAGNOSIS — B999 Unspecified infectious disease: Secondary | ICD-10-CM | POA: Diagnosis not present

## 2024-03-31 DIAGNOSIS — J3089 Other allergic rhinitis: Secondary | ICD-10-CM

## 2024-03-31 DIAGNOSIS — K219 Gastro-esophageal reflux disease without esophagitis: Secondary | ICD-10-CM

## 2024-03-31 DIAGNOSIS — J301 Allergic rhinitis due to pollen: Secondary | ICD-10-CM

## 2024-03-31 MED ORDER — FASENRA 30 MG/ML ~~LOC~~ SOSY: 30.0000 mg | PREFILLED_SYRINGE | SUBCUTANEOUS | 2 refills | Status: DC

## 2024-03-31 NOTE — Progress Notes (Signed)
 Follow Up Note  RE: Renee Flores MRN: [id number] DOB: 1966/01/21 Date of Office Visit: 03/31/2024  Referring provider: Mahlon Comer BRAVO, MD Primary care provider: Mahlon Comer BRAVO, MD  Chief Complaint: Follow-up (Pt presents to the office for 3 month follow up.)  History of Present Illness: I had the pleasure of seeing Renee Flores for a follow up visit at the [facility] on 03/31/2024. She is a 58 y.o. female, who is being followed for allergic rhinitis, recurrent infections, asthma, GERD. Her previous allergy  office visit was on 12/31/2023 with Dr. Luke. Today is a regular follow up visit.  Discussed the use of AI scribe software for clinical note transcription with the patient, who gave verbal consent to proceed.    She has been experiencing worsening allergy  symptoms this week, which she attributes to recent weather changes. Her current regimen includes Equate allergy  medicine, Flonase , and azelastine  nasal sprays used daily. No epistaxis, but she notes dryness inside her nose, exacerbated by the use of a CPAP machine with nasal pillows.  She has a history of asthma and recently experienced a flare that required urgent care treatment. Approximately a month and a half ago, she was prescribed prednisone  and antibiotics. She continues to use Wixela, one puff twice a day, and has been [employer] three times a day, though she has reduced this frequency. She also takes Incruse and Singulair  daily. She reports improvement with the current regimen, noting a difference in her symptoms.  She is prediabetic and is actively trying to manage her diet by reducing sugar intake, particularly in beverages like sweet tea. Her blood pressure has been stable since switching from losartan  to olmesartan  once daily. She continues to take clonidine  as well. Recent blood work showed elevated eosinophils.  She reports joint pain but no new or worsening symptoms. Her recent labs  showed a good response to the pneumonia vaccine.     2025 labs: You had a good response to the pneumonia vaccine which is great. Keep track of infections and antibiotics use.  03/06/2024 UC visit: On initial exam, the patient's lung sounds were clear throughout, DuoNeb was administered due to complaints of chest heaviness. Solu-Medrol  125 mg IM was also administered for bronchial inflammation and for possible asthma exacerbation. Post DuoNeb, sats increased to 93%. She was noted to have sats between 93 to 95% during her exam. Chest x-ray did not show any active cardiopulmonary disease. COVID test was negative. Symptoms consistent with possible asthma/COPD exacerbation. Will treat with a low-dose of prednisone  20 mg for the next 7 days and start Augmentin  875/2025 mg to cover for COPD exacerbation. Patient advised to continue her current asthma regimen. Promethazine  DM was also prescribed for the cough. Supportive care recommendations were provided and discussed with the patient to include avoiding asthma triggers, fluids, and rest. Patient was given strict ER follow-up precautions, along with recommendations to follow-up with her pulmonologist for further evaluation. Patient was in agreement with this plan of care and verbalizes understanding. All questions were answered. Patient stable for discharge.  02/03/2024 pulm visit: Asthma with recurrent upper respiratory infections and bronchitis Exacerbations during allergy  seasons lead to infections and bronchitis. High eosinophil count indicates asthma-related inflammation. Frequent antibiotics and prednisone  use is concerning due to fatty liver. Current medications include Wixela and Airsupra . Immune shots administered for recurrent infections. - Add Incruse inhaler, one puff daily, to reduce Airsupra  use. - Continue Wixela, one puff twice daily. - Continue Airsupra , one  puff every six hours as needed. - Consider biologic injection therapy to reduce  eosinophil count and inflammation.   Obesity Obesity exacerbates asthma and health issues. She is pre-diabetic, not seeing an endocrinologist, and considering weight loss medications. Lifestyle changes include dietary modifications and increased physical activity. - Consider starting weight loss medication such as Zepbound  if approved by neurology team. - Encourage continued dietary modifications, reducing sugar intake, and substituting healthier snacks. - Encourage increased physical activity, such as regular gym visits and walking. - Monitor weight and adjust treatment plan as needed.   Obstructive sleep apnea - Managed by neurology team. Zepbound  considered for weight loss and potential sleep apnea benefit.  Assessment and Plan: [name] is a 58 y.o. female with: Moderate persistent asthma - not well controlled.  Past history - Diagnosed by pulmonologist with symptoms of wheezing and shortness of breath. Currently managed with Wixela inhaler. Normal alpha-1 antitrypsin level. 2025 CXR normal. 2025 spirometry was unremarkable.  Interim history - Recent exacerbations required urgent care and prednisone . Elevated eosinophils. Pulmonologist agreeable to biologic therapy. Fasenra  selected for less frequent dosing. Eos 600. Continue plan as per pulmonology. Start paperwork for Fasenra  injections - loading dose of every 4 weeks x 2 doses then it's every 8 weeks. Tammy will be in touch with you regarding coverage. Consent was signed.  Daily controller medication(s): Continue Wixela 250mcg 1 puff twice a day and rinse mouth after each use.  Incruse inhaler, one puff daily, Continue Singulair  (montelukast ) 10mg  daily at night. May use Airsupra  rescue inhaler 2 puffs every 4 to 6 hours as needed for shortness of breath, chest tightness, coughing, and wheezing. Do not use more than 12 puffs in 24 hours. May use Airsupra  rescue inhaler 2 puffs 5 to 15 minutes prior to strenuous physical activities. Rinse  mouth after each use.  Monitor frequency of use - if you need to use it more than twice per week on a consistent basis let us  know.   Seasonal allergic rhinitis due to pollen Allergic rhinitis due to mold Past history - 2025 skin testing positive to grass, dust mites and mold. Interim history - CPAP drying nares out at times. Continue environmental control measures as below. Use over the counter antihistamines such as Zyrtec (cetirizine), Claritin  (loratadine ), Allegra (fexofenadine), or Xyzal (levocetirizine) daily as needed. May take twice a day during allergy  flares. May switch antihistamines every few months. Use azelastine  nasal spray 1-2 sprays per nostril twice a day as needed for runny nose/drainage. DO NOT USE while you have a dry nose. Use Flonase  (fluticasone ) nasal spray 1-2 sprays per nostril once a day as needed for nasal congestion.  Nasal saline spray (i.e., Simply Saline) or nasal saline lavage (i.e., NeilMed) is recommended as needed and prior to medicated nasal sprays. Consider allergy  injections for long term control if above medications do not help the symptoms.  Asthma must be in better control before starting.    Recurrent infections Past history - 2025 labs normal immunoglobulin levels, low pneumococcal titers, great response to pneumonia vaccine.  Interim history - 1 course of antibiotics since last visit.  Keep track of infections and antibiotics use.   Gastroesophageal reflux disease, unspecified whether esophagitis present Continue lifestyle and dietary modifications. Continue omeprazole  40mg  once day - nothing to eat or drink for 20-30 minutes afterwards.   Return in about 4 months (around 07/30/2024).  No orders of the defined types were placed in this encounter.  Lab Orders  No laboratory test(s) ordered today  Diagnostics: None.   Medication List:  Current Outpatient Medications  Medication Sig Dispense Refill   Albuterol -Budesonide (AIRSUPRA )  90-80 MCG/ACT AERO Inhale 2 puffs into the lungs every 6 (six) hours as needed. 1 g 3   aspirin  EC 81 MG tablet Take 81 mg by mouth daily.     Azelastine  HCl 137 MCG/SPRAY SOLN INSTILL 1-2 SPRAYS INTO BOTH NOSTRILS 2 (TWO) TIMES DAILY AS NEEDED (NASAL DRAINAGE). 90 mL 1   CALCIUM  MAGNESIUM ZINC PO Take 1 tablet by mouth daily in the afternoon.     carvedilol  (COREG ) 12.5 MG tablet Take 1 tablet (12.5 mg total) by mouth 2 (two) times daily. 180 tablet 3   Cholecalciferol (VITAMIN D3) 50 MCG (2000 UT) TABS      cloNIDine  (CATAPRES ) 0.1 MG tablet Take 1 tablet (0.1 mg total) by mouth 3 (three) times daily. 270 tablet 10   cyclobenzaprine  (FLEXERIL ) 5 MG tablet Take 1 tablet (5 mg total) by mouth 3 (three) times daily as needed for muscle spasms. 30 tablet 1   fluticasone  (FLONASE ) 50 MCG/ACT nasal spray Place 2 sprays into both nostrils daily. 16 g 6   gabapentin  (NEURONTIN ) 300 MG capsule Take 2 capsules (600 mg total) by mouth at bedtime. 180 capsule 1   ketotifen  (ZADITOR ) 0.025 % ophthalmic solution Place 2 drops into both eyes daily as needed (for irritation). 5 mL 0   meloxicam  (MOBIC ) 15 MG tablet Take 1 tablet (15 mg total) by mouth daily. 90 tablet 1   montelukast  (SINGULAIR ) 10 MG tablet Take 1 tablet (10 mg total) by mouth at bedtime. 90 tablet 1   Multiple Vitamins-Minerals (ONE-A-DAY WOMENS 50+ ADVANTAGE PO) Take 1 tablet by mouth daily. Unknown strength     olmesartan  (BENICAR ) 40 MG tablet TAKE 1 TABLET BY MOUTH EVERY DAY 90 tablet 3   omeprazole  (PRILOSEC) 40 MG capsule Take 1 capsule (40 mg total) by mouth daily. 90 capsule 3   ondansetron  (ZOFRAN ) 4 MG tablet Take 1 tablet (4 mg total) by mouth every 8 (eight) hours as needed for nausea or vomiting. 20 tablet 0   promethazine -dextromethorphan (PROMETHAZINE -DM) 6.25-15 MG/5ML syrup Take 5 mLs by mouth 4 (four) times daily as needed. 118 mL 0   simvastatin  (ZOCOR ) 40 MG tablet TAKE 1 TABLET BY MOUTH EVERYDAY AT BEDTIME 90 tablet 1    spironolactone  (ALDACTONE ) 25 MG tablet Take 1 tablet (25 mg total) by mouth daily. 90 tablet 3   tirzepatide  (MOUNJARO ) 2.5 MG/0.5ML Pen Inject 2.5 mg into the skin once a week. 2 mL 1   umeclidinium bromide  (INCRUSE ELLIPTA ) 62.5 MCG/ACT AEPB Inhale 1 puff into the lungs daily. 30 each 5   WIXELA INHUB 250-50 MCG/ACT AEPB INHALE 1 PUFF INTO THE LUNGS IN THE MORNING AND AT BEDTIME. 60 each 5   No current facility-administered medications for this visit.   Allergies: Allergies  Allergen Reactions   Hydrochlorothiazide  Nausea And Vomiting    Other reaction(s): Other (See Comments) Leg Cramps Leg Cramps  Other reaction(s): Other (See Comments) Leg Cramps   Morphine Other (See Comments), Rash and Nausea And Vomiting    Hallucinating  Other reaction(s): Other (See Comments) Hallucinating Hallucinating Hallucinating Hallucinating  Hallucinating  Hallucinating  Other reaction(s): Other (See Comments) Hallucinating Hallucinating   Vancomycin  Nausea And Vomiting    Ringing in ears   Erythromycin Base Hives   Erythromycin Base Hives   Doxycycline  Nausea Only and Rash   I reviewed her past medical history, social history, family history, and  environmental history and no significant changes have been reported from her previous visit.  Review of Systems  Constitutional:  Negative for appetite change, chills, fever and unexpected weight change.  HENT:  Negative for congestion.   Eyes:  Negative for itching.  Respiratory:  Negative for cough, chest tightness, shortness of breath and wheezing.   Cardiovascular:  Negative for chest pain.  Gastrointestinal:  Negative for abdominal pain.  Genitourinary:  Negative for difficulty urinating.  Skin:  Negative for rash.  Allergic/Immunologic: Positive for environmental allergies.    Objective: BP (!) 134/96 (BP Location: Left Arm, Patient Position: Sitting, Cuff Size: Large)   Pulse 80   Temp 97.9 F (36.6 C) (Temporal)   Resp 18   Ht  5' 2 (1.575 m)   Wt 267 lb 6.4 oz (121.3 kg)   SpO2 92%   BMI 48.91 kg/m  Body mass index is 48.91 kg/m. Physical Exam Vitals and nursing note reviewed.  Constitutional:      Appearance: Normal appearance. She is well-developed. She is obese.  HENT:     Head: Normocephalic and atraumatic.     Right Ear: Tympanic membrane and external ear normal.     Left Ear: Tympanic membrane and external ear normal.     Nose: Nose normal.     Mouth/Throat:     Mouth: Mucous membranes are moist.     Pharynx: Oropharynx is clear.  Eyes:     Conjunctiva/sclera: Conjunctivae normal.  Cardiovascular:     Rate and Rhythm: Normal rate and regular rhythm.     Heart sounds: Normal heart sounds. No murmur heard.    No friction rub. No gallop.  Pulmonary:     Effort: Pulmonary effort is normal.     Breath sounds: Normal breath sounds. No wheezing, rhonchi or rales.  Musculoskeletal:     Cervical back: Neck supple.  Skin:    General: Skin is warm.     Findings: No rash.  Neurological:     Mental Status: She is alert and oriented to person, place, and time.  Psychiatric:        Behavior: Behavior normal.    Previous notes and tests were reviewed. The plan was reviewed with the patient/family, and all questions/concerned were addressed.  It was my pleasure to see Jeronda today and participate in her care. Please feel free to contact me with any questions or concerns.  Sincerely,  Orlan Cramp, DO Allergy  & Immunology  [facility]  [place] office: [phone] [facility] office: [phone]

## 2024-03-31 NOTE — Telephone Encounter (Signed)
-----   Message from Orlan CHRISTELLA Cramp sent at 03/31/2024 11:45 AM EST ----- Please start PA for [facility] for asthma. Eos 600. Thank you.

## 2024-03-31 NOTE — Patient Instructions (Addendum)
 Keep up with the exercising!  Environmental allergies 2025 skin testing positive to grass, dust mites and mold. Continue environmental control measures as below. Use over the counter antihistamines such as Zyrtec (cetirizine), Claritin  (loratadine ), Allegra (fexofenadine), or Xyzal (levocetirizine) daily as needed. May take twice a day during allergy  flares. May switch antihistamines every few months. Use azelastine  nasal spray 1-2 sprays per nostril twice a day as needed for runny nose/drainage. DO NOT USE while you have a dry nose. Use Flonase  (fluticasone ) nasal spray 1-2 sprays per nostril once a day as needed for nasal congestion.  Nasal saline spray (i.e., Simply Saline) or nasal saline lavage (i.e., NeilMed) is recommended as needed and prior to medicated nasal sprays. Consider allergy  injections for long term control if above medications do not help the symptoms.  Asthma must be in better control before starting.   Infections Keep track of infections and antibiotics use.  Asthma Continue plan as per pulmonology. Start paperwork for Fasenra injections - loading dose of every 4 weeks x 2 doses then it's every 8 weeks. Tammy will be in touch with you regarding coverage. Consent was signed.  Daily controller medication(s): Continue Wixela 250mcg 1 puff twice a day and rinse mouth after each use.  Incruse inhaler, one puff daily, Continue Singulair  (montelukast ) 10mg  daily at night. May use Airsupra  rescue inhaler 2 puffs every 4 to 6 hours as needed for shortness of breath, chest tightness, coughing, and wheezing. Do not use more than 12 puffs in 24 hours. May use Airsupra  rescue inhaler 2 puffs 5 to 15 minutes prior to strenuous physical activities. Rinse mouth after each use.  Monitor frequency of use - if you need to use it more than twice per week on a consistent basis let us  know.  Breathing control goals:  Full participation in all desired activities (may need albuterol  before  activity) Albuterol  use two times or less a week on average (not counting use with activity) Cough interfering with sleep two times or less a month Oral steroids no more than once a year No hospitalizations   GERD Continue lifestyle and dietary modifications. Continue omeprazole  40mg  once day - nothing to eat or drink for 20-30 minutes afterwards.   Follow up in 4 months or sooner if needed.   Reducing Pollen Exposure Pollen seasons: trees (spring), grass (summer) and ragweed/weeds (fall). Keep windows closed in your home and car to lower pollen exposure.  Install air conditioning in the bedroom and throughout the house if possible.  Avoid going out in dry windy days - especially early morning. Pollen counts are highest between 5 - 10 AM and on dry, hot and windy days.  Save outside activities for late afternoon or after a heavy rain, when pollen levels are lower.  Avoid mowing of grass if you have grass pollen allergy . Be aware that pollen can also be transported indoors on people and pets.  Dry your clothes in an automatic dryer rather than hanging them outside where they might collect pollen.  Rinse hair and eyes before bedtime.  Control of House Dust Mite Allergen Dust mite allergens are a common trigger of allergy  and asthma symptoms. While they can be found throughout the house, these microscopic creatures thrive in warm, humid environments such as bedding, upholstered furniture and carpeting. Because so much time is spent in the bedroom, it is essential to reduce mite levels there.  Encase pillows, mattresses, and box springs in special allergen-proof fabric covers or airtight, zippered plastic covers.  Bedding should be washed weekly in hot water (130 F) and dried in a hot dryer. Allergen-proof covers are available for comforters and pillows that can't be regularly washed.  Wash the allergy -proof covers every few months. Minimize clutter in the bedroom. Keep pets out of the  bedroom.  Keep humidity less than 50% by using a dehumidifier or air conditioning. You can buy a humidity measuring device called a hygrometer to monitor this.  If possible, replace carpets with hardwood, linoleum, or washable area rugs. If that's not possible, vacuum frequently with a vacuum that has a HEPA filter. Remove all upholstered furniture and non-washable window drapes from the bedroom. Remove all non-washable stuffed toys from the bedroom.  Wash stuffed toys weekly.  Mold Control Mold and fungi can grow on a variety of surfaces provided certain temperature and moisture conditions exist.  Outdoor molds grow on plants, decaying vegetation and soil. The major outdoor mold, Alternaria and Cladosporium, are found in very high numbers during hot and dry conditions. Generally, a late summer - fall peak is seen for common outdoor fungal spores. Rain will temporarily lower outdoor mold spore count, but counts rise rapidly when the rainy period ends. The most important indoor molds are Aspergillus and Penicillium. Dark, humid and poorly ventilated basements are ideal sites for mold growth. The next most common sites of mold growth are the bathroom and the kitchen. Outdoor (Seasonal) Mold Control Use air conditioning and keep windows closed. Avoid exposure to decaying vegetation. Avoid leaf raking. Avoid grain handling. Consider wearing a face mask if working in moldy areas.  Indoor (Perennial) Mold Control  Maintain humidity below 50%. Get rid of mold growth on hard surfaces with water, detergent and, if necessary, 5% bleach (do not mix with other cleaners). Then dry the area completely. If mold covers an area more than 10 square feet, consider hiring an indoor environmental professional. For clothing, washing with soap and water is best. If moldy items cannot be cleaned and dried, throw them away. Remove sources e.g. contaminated carpets. Repair and seal leaking roofs or pipes. Using  dehumidifiers in damp basements may be helpful, but empty the water and clean units regularly to prevent mildew from forming. All rooms, especially basements, bathrooms and kitchens, require ventilation and cleaning to deter mold and mildew growth. Avoid carpeting on concrete or damp floors, and storing items in damp areas.

## 2024-04-01 ENCOUNTER — Other Ambulatory Visit (HOSPITAL_COMMUNITY): Payer: Self-pay

## 2024-04-01 ENCOUNTER — Other Ambulatory Visit: Payer: Self-pay | Admitting: Neurology

## 2024-04-01 DIAGNOSIS — M2619 Other specified anomalies of jaw-cranial base relationship: Secondary | ICD-10-CM

## 2024-04-01 DIAGNOSIS — D6851 Activated protein C resistance: Secondary | ICD-10-CM

## 2024-04-01 DIAGNOSIS — G4733 Obstructive sleep apnea (adult) (pediatric): Secondary | ICD-10-CM

## 2024-04-03 NOTE — Telephone Encounter (Signed)
 I have ordered a sleep study, would like for her to have a RV WITH [facility] IN THE NEXT 3-4 MONTHS.   Dr Maryalice was contacted and agrees with HST  for baseline.

## 2024-04-04 ENCOUNTER — Other Ambulatory Visit (HOSPITAL_COMMUNITY): Payer: Self-pay

## 2024-04-04 ENCOUNTER — Ambulatory Visit: Admitting: Family Medicine

## 2024-04-04 ENCOUNTER — Telehealth: Payer: Self-pay | Admitting: *Deleted

## 2024-04-04 ENCOUNTER — Encounter: Payer: Self-pay | Admitting: Family Medicine

## 2024-04-04 VITALS — BP 128/80 | HR 72 | Temp 98.1°F | Resp 13 | Ht 62.0 in | Wt 264.8 lb

## 2024-04-04 DIAGNOSIS — U071 COVID-19: Secondary | ICD-10-CM

## 2024-04-04 DIAGNOSIS — R051 Acute cough: Secondary | ICD-10-CM

## 2024-04-04 LAB — POCT INFLUENZA A/B
Influenza A, POC: NEGATIVE
Influenza B, POC: NEGATIVE

## 2024-04-04 LAB — POC COVID19 BINAXNOW: SARS Coronavirus 2 Ag: POSITIVE — AB

## 2024-04-04 MED ORDER — NIRMATRELVIR/RITONAVIR (PAXLOVID)TABLET: 3.0000 | ORAL_TABLET | Freq: Two times a day (BID) | ORAL | 0 refills | Status: AC

## 2024-04-04 MED ORDER — FASENRA 30 MG/ML ~~LOC~~ SOSY
30.0000 mg | PREFILLED_SYRINGE | SUBCUTANEOUS | 6 refills | Status: AC
  Filled 2024-04-15: qty 1, 56d supply, fill #0

## 2024-04-04 MED ORDER — FASENRA PEN 30 MG/ML ~~LOC~~ SOAJ
30.0000 mg | SUBCUTANEOUS | 2 refills | Status: AC
  Filled 2024-04-15: qty 1, 28d supply, fill #0

## 2024-04-04 NOTE — Telephone Encounter (Signed)
 I see pt is scheduled with Dr Chalice for 05/16/24.

## 2024-04-04 NOTE — Progress Notes (Signed)
   Subjective:    Patient ID: Renee Flores, female    DOB: 12-03-1965, 58 y.o.   MRN: [id number]  HPI URI- pt reports baseline congestion and allergy  sxs worsened Saturday.  Developed wheezing, sore throat, productive cough, R ear pain, HA.  No fever.     Review of Systems For ROS see HPI     Objective:   Physical Exam Vitals reviewed.  Constitutional:      General: She is not in acute distress.    Appearance: Normal appearance. She is not ill-appearing.  HENT:     Head: Normocephalic and atraumatic.     Right Ear: Tympanic membrane and ear canal normal.     Left Ear: Tympanic membrane and ear canal normal.     Nose: Congestion present.     Comments: No TTP over frontal or maxillary sinuses    Mouth/Throat:     Mouth: Mucous membranes are moist.     Pharynx: No oropharyngeal exudate or posterior oropharyngeal erythema.  Eyes:     Extraocular Movements: Extraocular movements intact.     Conjunctiva/sclera: Conjunctivae normal.  Cardiovascular:     Rate and Rhythm: Normal rate and regular rhythm.  Pulmonary:     Effort: Pulmonary effort is normal. No respiratory distress.     Breath sounds: No wheezing or rhonchi.     Comments: Wet cough Musculoskeletal:     Cervical back: Neck supple.  Lymphadenopathy:     Cervical: No cervical adenopathy.  Neurological:     General: No focal deficit present.     Mental Status: She is alert and oriented to person, place, and time.  Psychiatric:        Mood and Affect: Mood normal.        Behavior: Behavior normal.        Thought Content: Thought content normal.           Assessment & Plan:  COVID- new.  Pt's sxs are consistent w/ dx and rapid test confirms.  Start Paxlovid .  Reviewed supportive care and red flags that should prompt return.  Pt expressed understanding and is in agreement w/ plan.

## 2024-04-04 NOTE — Telephone Encounter (Signed)
 Called patient and advised approval and submit to [facility] for Fasenra . Her copay will be $40 monthly with INs. I will reach out once delivery scheduled to get her initial dose in clinic    ----- Message from Orlan CHRISTELLA Cramp sent at 03/31/2024 11:45 AM EST ----- Please start PA for Fasenra  for asthma. Eos 600. Thank you.

## 2024-04-04 NOTE — Patient Instructions (Addendum)
 Follow up as needed or as scheduled START the Paxlovid  HOLD the Simvastatin  while you are taking the Paxlovid  Drink LOTS of fluids REST! Cough medicine as needed Call with any questions or concerns Hang in there!!!

## 2024-04-05 ENCOUNTER — Other Ambulatory Visit: Payer: Self-pay

## 2024-04-05 NOTE — Progress Notes (Signed)
 Patient has Covid- would like to talk later. Moved to [employer] 12/11

## 2024-04-11 ENCOUNTER — Other Ambulatory Visit: Payer: Self-pay

## 2024-04-11 NOTE — Progress Notes (Signed)
 Specialty Pharmacy Initiation Note   Renee Flores is a 58 y.o. female who will be followed by the specialty pharmacy service for RxSp Asthma/COPD    Review of administration, indication, effectiveness, safety, potential side effects, storage/disposable, and missed dose instructions occurred today for patient's specialty medication(s) Benralizumab  (Fasenra , Fasenra  Pen)     Patient/Caregiver did not have any additional questions or concerns.   Patient's therapy is appropriate to: Initiate    Goals Addressed             This Visit's Progress    Reduce disease symptoms including coughing and shortness of breath       Patient is initiating therapy. Patient will maintain adherence         Renee Flores Specialty Pharmacist

## 2024-04-12 NOTE — Progress Notes (Signed)
 Moved to Friday 12/19 to call patient back for payment and delivery set up

## 2024-04-14 ENCOUNTER — Telehealth: Payer: Self-pay | Admitting: Neurology

## 2024-04-14 NOTE — Telephone Encounter (Signed)
 HST- HTA pending

## 2024-04-15 ENCOUNTER — Other Ambulatory Visit (HOSPITAL_COMMUNITY): Payer: Self-pay

## 2024-04-15 ENCOUNTER — Other Ambulatory Visit: Payer: Self-pay

## 2024-04-15 NOTE — Progress Notes (Signed)
 Specialty Pharmacy Initial Fill Coordination Note  Renee Flores is a 58 y.o. female contacted today regarding initial fill of specialty medication(s) Benralizumab  (Fasenra , Fasenra  Pen)   Patient requested Courier to Provider Office   Delivery date: 04/19/24   Verified address: [address] [place] [address]   Medication will be filled on: 04/18/24   Patient is aware of $45.52 copayment.

## 2024-04-18 ENCOUNTER — Other Ambulatory Visit: Payer: Self-pay

## 2024-04-19 ENCOUNTER — Other Ambulatory Visit (HOSPITAL_COMMUNITY): Payer: Self-pay

## 2024-04-25 NOTE — Telephone Encounter (Signed)
 L/m for patient fasenra  delivery 12/23 can call to schedule appt to start therapy

## 2024-05-04 ENCOUNTER — Telehealth: Payer: Self-pay | Admitting: Neurology

## 2024-05-04 NOTE — Telephone Encounter (Signed)
 Pt called to reschedule appt due to conflict appt rescheduled

## 2024-05-05 NOTE — Telephone Encounter (Signed)
 HST HTA shara: [id number] (exp. 04/14/24 to 07/13/24)

## 2024-05-06 ENCOUNTER — Ambulatory Visit: Payer: Self-pay | Admitting: Adult Health

## 2024-05-06 ENCOUNTER — Ambulatory Visit: Payer: Self-pay

## 2024-05-06 NOTE — Telephone Encounter (Signed)
 appointment scheduled on 01.12.26.

## 2024-05-06 NOTE — Telephone Encounter (Signed)
 FYI Only or Action Required?: FYI only for provider: appointment scheduled on 01.12.26.  Patient was last seen in primary care on 04/04/2024 by Mahlon Comer BRAVO, MD.  Called Nurse Triage reporting Cough.  Symptoms began several months ago.  Interventions attempted: OTC medications: Mucus DM.  Symptoms are: gradually worsening.  Triage Disposition: See Physician Within 24 Hours  Patient/caregiver understands and will follow disposition?: Yes  Message from Isabell A sent at 05/06/2024  2:37 PM EST  Reason for Triage: Patient has a cough with mucus she can't get rid of.  Callback number: [phone]   Reason for Disposition  [1] Continuous (nonstop) coughing interferes with work or school AND [2] no improvement using cough treatment per Care Advice  Answer Assessment - Initial Assessment Questions 1. ONSET: When did the cough begin?       X 2 months  2. SEVERITY: How bad is the cough today?       Worse since onset  3. SPUTUM: Describe the color of your sputum (e.g., none, dry cough; clear, white, yellow, green)      Yes, green  4. HEMOPTYSIS: Are you coughing up any blood? If Yes, ask: How much? (e.g., flecks, streaks, tablespoons, etc.)      Denies  5. DIFFICULTY BREATHING: Are you having difficulty breathing? If Yes, ask: How bad is it? (e.g., mild, moderate, severe)       Trouble catching breath when coughing  6. FEVER: Do you have a fever? If Yes, ask: What is your temperature, how was it measured, and when did it start?      Denies  7. CARDIAC HISTORY: Do you have any history of heart disease? (e.g., heart attack, congestive heart failure)      Per pt's chart,  HTN  8. LUNG HISTORY: Do you have any history of lung disease?  (e.g., pulmonary embolus, asthma, emphysema)     Per pt's chart,  asthma   9. PE RISK FACTORS: Do you have a history of blood clots? (or: recent major surgery, recent prolonged travel, bedridden)     Per pt's chart,   pt does not have PE risk factors  10. OTHER SYMPTOMS: Pt denies runny nose, wheezing, chest pain      Pt reports Cough Pt is taking OTC mucus DM Pt scheduled for a visit on  01.12.26 with PCP for further evaluation. Pt also would like to schedule with pulmonology. Pt agrees with plan of care, will call back for any worsening symptoms  Protocols used: Cough - Acute Productive-A-AH

## 2024-05-06 NOTE — Telephone Encounter (Signed)
 FYI Only or Action Required?: Action required by provider: request for appointment and update on patient condition.  Patient is followed in Pulmonology for Asthma, last seen on 02/03/2024 by Kara Dorn NOVAK, MD.  Called Nurse Triage reporting Cough.  Symptoms began several months ago.  Interventions attempted: OTC medications: Mucinex.  Symptoms are: gradually worsening.  Triage Disposition: See Physician Within 24 Hours  Patient/caregiver understands and will follow disposition?: Yes  Message from Isabell A sent at 05/06/2024  2:37 PM EST  Reason for Triage: Patient has a cough with mucus she can't get rid of.  Callback number: [phone]    Reason for Disposition  [1] Continuous (nonstop) coughing interferes with work or school AND [2] no improvement using cough treatment per Care Advice  Answer Assessment - Initial Assessment Questions 1. ONSET: When did the cough begin?       X 2 months  2. SEVERITY: How bad is the cough today?       Worse since onset  3. SPUTUM: Describe the color of your sputum (e.g., none, dry cough; clear, white, yellow, green)      Yes, green  4. HEMOPTYSIS: Are you coughing up any blood? If Yes, ask: How much? (e.g., flecks, streaks, tablespoons, etc.)      Denies  5. DIFFICULTY BREATHING: Are you having difficulty breathing? If Yes, ask: How bad is it? (e.g., mild, moderate, severe)       Trouble catching breath when coughing  6. FEVER: Do you have a fever? If Yes, ask: What is your temperature, how was it measured, and when did it start?      Denies  7. CARDIAC HISTORY: Do you have any history of heart disease? (e.g., heart attack, congestive heart failure)      Per pt's chart,  HTN  8. LUNG HISTORY: Do you have any history of lung disease?  (e.g., pulmonary embolus, asthma, emphysema)     Per pt's chart,  asthma   9. PE RISK FACTORS: Do you have a history of blood clots? (or: recent major surgery, recent  prolonged travel, bedridden)     Per pt's chart,  pt does not have PE risk factors  10. OTHER SYMPTOMS: Pt denies runny nose, wheezing, chest pain      Pt reports Cough Pt is taking OTC mucus DM Earliest appt with Pulmonology is on 02.17.26.  Pt scheduled with PCP for 01.12.26, however, pt is requesting a sooner appt with pulmonology if possible Routing to clinic to assist with scheduling Pt agrees with plan of care, will call back for any worsening symptoms  Protocols used: Cough - Acute Productive-A-AH

## 2024-05-09 ENCOUNTER — Ambulatory Visit: Admitting: Family Medicine

## 2024-05-09 ENCOUNTER — Ambulatory Visit (HOSPITAL_BASED_OUTPATIENT_CLINIC_OR_DEPARTMENT_OTHER)
Admission: RE | Admit: 2024-05-09 | Discharge: 2024-05-09 | Disposition: A | Discharge status: Home / Self Care | Source: Ambulatory Visit | Attending: Family Medicine | Admitting: Family Medicine

## 2024-05-09 ENCOUNTER — Encounter: Payer: Self-pay | Admitting: Family Medicine

## 2024-05-09 ENCOUNTER — Other Ambulatory Visit: Payer: Self-pay

## 2024-05-09 VITALS — BP 136/82 | HR 76 | Wt 261.4 lb

## 2024-05-09 DIAGNOSIS — R058 Other specified cough: Secondary | ICD-10-CM | POA: Insufficient documentation

## 2024-05-09 MED ORDER — AZITHROMYCIN 250 MG PO TABS: ORAL_TABLET | ORAL | 0 refills | Status: DC

## 2024-05-09 MED ORDER — PREDNISONE 10 MG PO TABS: ORAL_TABLET | ORAL | 0 refills | Status: DC

## 2024-05-09 NOTE — Patient Instructions (Signed)
 Follow up as needed or as scheduled GO to [facility] and get your xray START the Prednisone  as directed- 3 pills at the same time x3 days, then 2 pills at the same time x3 days, then 1 pill daily.  Take w/ food  TAKE the Zpack as directed CONTINUE your inhalers Call with any questions or concerns Hang in there!!!

## 2024-05-09 NOTE — Progress Notes (Unsigned)
" ° °  Subjective:    Patient ID: Renee Flores, female    DOB: 12/13/1965, 59 y.o.   MRN: [id number]  HPI Cough- pt reports cough will 'calm down' but cough is continuous.  Has been following w/ Allergy  and Pulmonary.  Did get an earlier appt w/ Pulmonary and sees them on 1/26.  Pt reports cough is deep and 'it's like I have no air left when I'm done coughing'.  Cough is productive of green sputum, 'and it's chunky'.  Currently on Incruse Ellipta , Wixela, AirSupra , Singulair .  Is supposed to start Fasenra  but has not had first injxn yet.   Review of Systems For ROS see HPI     Objective:   Physical Exam Vitals reviewed.  Constitutional:      General: She is not in acute distress.    Appearance: Normal appearance. She is not ill-appearing.  HENT:     Head: Normocephalic and atraumatic.     Right Ear: Tympanic membrane and ear canal normal.     Left Ear: Tympanic membrane and ear canal normal.     Nose: Congestion present.     Comments: No TTP over frontal or maxillary sinuses    Mouth/Throat:     Mouth: Mucous membranes are moist.     Pharynx: No oropharyngeal exudate or posterior oropharyngeal erythema.     Comments: + PND Eyes:     Extraocular Movements: Extraocular movements intact.     Conjunctiva/sclera: Conjunctivae normal.  Cardiovascular:     Rate and Rhythm: Normal rate and regular rhythm.  Pulmonary:     Effort: Pulmonary effort is normal.     Breath sounds: Wheezing (diffuse expiratory wheezes) and rhonchi (coarse breath sounds throughout) present.     Comments: Deep, hacking cough Musculoskeletal:     Cervical back: Neck supple.  Lymphadenopathy:     Cervical: No cervical adenopathy.  Skin:    General: Skin is warm and dry.  Neurological:     General: No focal deficit present.     Mental Status: She is alert and oriented to person, place, and time.  Psychiatric:        Mood and Affect: Mood normal.        Behavior: Behavior normal.        Thought Content:  Thought content normal.           Assessment & Plan:  Productive cough- new.  Pt reports cough productive of 'chunky green sputum'.  + wheezing and SOB.  Has appt upcoming w/ Pulmonary later this month but needs immediate tx.  Start Zpack, Prednisone  taper and get CXR to assess.  Reviewed supportive care and red flags that should prompt return.  Pt expressed understanding and is in agreement w/ plan.   "

## 2024-05-09 NOTE — Telephone Encounter (Signed)
 Spoke to patient. She is scheduled to see PCP today to discuss acute sx.  Appt scheduled with Dr. Kara 05/23/2024 to discuss chronic cough.  NFN.

## 2024-05-13 ENCOUNTER — Ambulatory Visit (HOSPITAL_BASED_OUTPATIENT_CLINIC_OR_DEPARTMENT_OTHER): Admitting: Cardiology

## 2024-05-15 ENCOUNTER — Ambulatory Visit: Payer: Self-pay | Admitting: Family Medicine

## 2024-05-16 ENCOUNTER — Ambulatory Visit: Payer: PPO | Admitting: Neurology

## 2024-05-18 ENCOUNTER — Ambulatory Visit: Admitting: Pulmonary Disease

## 2024-05-18 ENCOUNTER — Encounter: Payer: Self-pay | Admitting: Pulmonary Disease

## 2024-05-18 DIAGNOSIS — K219 Gastro-esophageal reflux disease without esophagitis: Secondary | ICD-10-CM

## 2024-05-18 DIAGNOSIS — R058 Other specified cough: Secondary | ICD-10-CM | POA: Diagnosis not present

## 2024-05-18 DIAGNOSIS — J4540 Moderate persistent asthma, uncomplicated: Secondary | ICD-10-CM

## 2024-05-18 MED ORDER — INCRUSE ELLIPTA 62.5 MCG/ACT IN AEPB: 1.0000 | INHALATION_SPRAY | Freq: Every day | RESPIRATORY_TRACT | 5 refills | Status: AC

## 2024-05-18 MED ORDER — FLUTICASONE-SALMETEROL 250-50 MCG/ACT IN AEPB: 1.0000 | INHALATION_SPRAY | Freq: Two times a day (BID) | RESPIRATORY_TRACT | 5 refills | Status: AC

## 2024-05-18 MED ORDER — MONTELUKAST SODIUM 10 MG PO TABS: 10.0000 mg | ORAL_TABLET | Freq: Every day | ORAL | 1 refills | Status: AC

## 2024-05-18 NOTE — Patient Instructions (Addendum)
 Continue wixella inhaler 1 puff twice daily - rinse mouth out after each use  Continue incruse inhaler 1 puff daily  Continue to use airsupra  inhaler 1-2 puffs every 4-6 hours as needed  Agree with starting the Fasenra  injection for your asthma  Continue to work on diet and activity to help with weight loss  Recommend eating 2-3 hours before and sleeping on a wedge pillow to remain elevated  Follow up in 4 months

## 2024-05-18 NOTE — Assessment & Plan Note (Signed)
" °  Orders:   umeclidinium bromide  (INCRUSE ELLIPTA ) 62.5 MCG/ACT AEPB; Inhale 1 puff into the lungs daily.   fluticasone -salmeterol (WIXELA INHUB) 250-50 MCG/ACT AEPB; Inhale 1 puff into the lungs 2 (two) times daily. in the morning and at bedtime.   montelukast  (SINGULAIR ) 10 MG tablet; Take 1 tablet (10 mg total) by mouth at bedtime.  "

## 2024-05-18 NOTE — Progress Notes (Unsigned)
 "  Established Patient Pulmonology Office Visit   Subjective:  Patient ID: Renee Flores, female    DOB: Oct 07, 1965  MRN: [id number]  CC:  Chief Complaint  Patient presents with   Medical Management of Chronic Issues    Pt states PCP did xray and was concern about it     Discussed the use of AI scribe software for clinical note transcription with the patient, who gave verbal consent to proceed.  History of Present Illness Renee Flores is a 59 year old woman, non-smoker with asthma and sleep apnea who returns for follow up.   She has had persistent cough and mucus production since a recent pneumonia. She completed prednisone  and a Z-Pak today. The cough has improved, but she still has green, sometimes chunky sputum. Cough is worse with movement and less severe when she is still or sleeping.  She has had multiple recent respiratory infections including COVID-19 in December, influenza in November, and RSV last year. Despite prior antibiotics, she continues to have mucus production and episodes of severe coughing that leave her breathless and require use of her emergency inhaler.  She has not started Fasenra  injections for asthma due to concern of her recent infection. She is using wixella twice daily and Incruse daily. She uses CPAP.  She has heartburn and reflux that are well controlled with medication but recur with missed doses. She sleeps with her head elevated using a wedge and pillows. She has a travel nebulizer at home but no flutter valve device.        ROS   Current Medications[1]      Objective:  BP (!) 143/72   Pulse 76   Ht 5' 2 (1.575 m) Comment: per pt  Wt 264 lb (119.7 kg)   SpO2 95%   BMI 48.29 kg/m     Physical Exam Constitutional:      General: She is not in acute distress.    Appearance: Normal appearance. She is obese.  Eyes:     General: No scleral icterus.    Conjunctiva/sclera: Conjunctivae normal.  Cardiovascular:     Rate and Rhythm:  Normal rate and regular rhythm.  Pulmonary:     Breath sounds: No wheezing, rhonchi or rales.  Musculoskeletal:     Right lower leg: No edema.     Left lower leg: No edema.  Skin:    General: Skin is warm and dry.  Neurological:     General: No focal deficit present.      Diagnostic Review:  Last CBC Lab Results  Component Value Date   WBC 6.8 03/28/2024   HGB 12.9 03/28/2024   HCT 39.0 03/28/2024   MCV 87.5 03/28/2024   MCH 29.6 06/24/2023   RDW 13.4 03/28/2024   PLT 199.0 03/28/2024   Last metabolic panel Lab Results  Component Value Date   GLUCOSE 112 (H) 03/28/2024   NA 139 03/28/2024   K 3.8 03/28/2024   CL 103 03/28/2024   CO2 27 03/28/2024   BUN 13 03/28/2024   CREATININE 0.75 03/28/2024   GFR 88.40 03/28/2024   CALCIUM  9.4 03/28/2024   PROT 7.1 03/28/2024   ALBUMIN 4.3 03/28/2024   BILITOT 0.6 03/28/2024   ALKPHOS 56 03/28/2024   AST 28 03/28/2024   ALT 37 (H) 03/28/2024   ANIONGAP 11 04/27/2021   CXR 05/09/24 1. Mild patchy opacity at the left lung base, atelectasis versus pneumonia. 2. Minimal bronchial thickening.       Assessment & Plan:  Assessment & Plan Moderate persistent asthma, unspecified whether complicated  Orders:   umeclidinium bromide  (INCRUSE ELLIPTA ) 62.5 MCG/ACT AEPB; Inhale 1 puff into the lungs daily.   fluticasone -salmeterol (WIXELA INHUB) 250-50 MCG/ACT AEPB; Inhale 1 puff into the lungs 2 (two) times daily. in the morning and at bedtime.   montelukast  (SINGULAIR ) 10 MG tablet; Take 1 tablet (10 mg total) by mouth at bedtime.   Flutter valve; Future   Assessment and Plan Assessment & Plan Moderate persistent asthma Asthma exacerbation with increased mucus production and cough. Fasenra  expected to reduce symptoms. Allergy  shots contingent on asthma control. - Start Fasenra  injections. - Use emergency inhaler for severe cough. - Provided flutter valve for mucus clearance. - Contact allergy  clinic to reschedule  Fasenra  injection. - Continue wixella and incruse  Post-infectious cough and mucus production Persistent cough and mucus production post-pneumonia and COVID-19. Fasenra  anticipated to reduce symptoms. - Use flutter valve for mucus clearance.  Gastroesophageal reflux disease GERD well-controlled with medication. Reflux may contribute to respiratory symptoms. - Continue current GERD medication regimen. - Stay upright for two hours post-meals. - Sleep with head elevated.      Return in about 4 months (around 09/15/2024) for f/u visit Dr. Kara.   Dorn KATHEE Kara, MD     [1]  Current Outpatient Medications:    Albuterol -Budesonide (AIRSUPRA ) 90-80 MCG/ACT AERO, Inhale 2 puffs into the lungs every 6 (six) hours as needed., Disp: 1 g, Rfl: 3   aspirin  EC 81 MG tablet, Take 81 mg by mouth daily., Disp: , Rfl:    Azelastine  HCl 137 MCG/SPRAY SOLN, INSTILL 1-2 SPRAYS INTO BOTH NOSTRILS 2 (TWO) TIMES DAILY AS NEEDED (NASAL DRAINAGE)., Disp: 90 mL, Rfl: 1   benralizumab  (FASENRA  PEN) 30 MG/ML prefilled autoinjector, Inject 1 mL (30 mg total) into the skin every 28 (twenty-eight) days., Disp: 1 mL, Rfl: 2   benralizumab  (FASENRA ) 30 MG/ML prefilled syringe, Inject 1 mL (30 mg total) into the skin every 8 (eight) weeks., Disp: 1 mL, Rfl: 6   CALCIUM  MAGNESIUM ZINC PO, Take 1 tablet by mouth daily in the afternoon., Disp: , Rfl:    carvedilol  (COREG ) 12.5 MG tablet, Take 1 tablet (12.5 mg total) by mouth 2 (two) times daily., Disp: 180 tablet, Rfl: 3   Cholecalciferol (VITAMIN D3) 50 MCG (2000 UT) TABS, , Disp: , Rfl:    cloNIDine  (CATAPRES ) 0.1 MG tablet, Take 1 tablet (0.1 mg total) by mouth 3 (three) times daily., Disp: 270 tablet, Rfl: 10   cyclobenzaprine  (FLEXERIL ) 5 MG tablet, Take 1 tablet (5 mg total) by mouth 3 (three) times daily as needed for muscle spasms., Disp: 30 tablet, Rfl: 1   fluticasone  (FLONASE ) 50 MCG/ACT nasal spray, Place 2 sprays into both nostrils daily., Disp: 16 g,  Rfl: 6   gabapentin  (NEURONTIN ) 300 MG capsule, Take 2 capsules (600 mg total) by mouth at bedtime., Disp: 180 capsule, Rfl: 1   ketotifen  (ZADITOR ) 0.025 % ophthalmic solution, Place 2 drops into both eyes daily as needed (for irritation)., Disp: 5 mL, Rfl: 0   meloxicam  (MOBIC ) 15 MG tablet, Take 1 tablet (15 mg total) by mouth daily., Disp: 90 tablet, Rfl: 1   Multiple Vitamins-Minerals (ONE-A-DAY WOMENS 50+ ADVANTAGE PO), Take 1 tablet by mouth daily. Unknown strength, Disp: , Rfl:    olmesartan  (BENICAR ) 40 MG tablet, TAKE 1 TABLET BY MOUTH EVERY DAY, Disp: 90 tablet, Rfl: 3   omeprazole  (PRILOSEC) 40 MG capsule, Take 1 capsule (40 mg total) by mouth  daily., Disp: 90 capsule, Rfl: 3   ondansetron  (ZOFRAN ) 4 MG tablet, Take 1 tablet (4 mg total) by mouth every 8 (eight) hours as needed for nausea or vomiting., Disp: 20 tablet, Rfl: 0   promethazine -dextromethorphan (PROMETHAZINE -DM) 6.25-15 MG/5ML syrup, Take 5 mLs by mouth 4 (four) times daily as needed., Disp: 118 mL, Rfl: 0   simvastatin  (ZOCOR ) 40 MG tablet, TAKE 1 TABLET BY MOUTH EVERYDAY AT BEDTIME, Disp: 90 tablet, Rfl: 1   spironolactone  (ALDACTONE ) 25 MG tablet, Take 1 tablet (25 mg total) by mouth daily., Disp: 90 tablet, Rfl: 3   tirzepatide  (MOUNJARO ) 2.5 MG/0.5ML Pen, Inject 2.5 mg into the skin once a week., Disp: 2 mL, Rfl: 1   fluticasone -salmeterol (WIXELA INHUB) 250-50 MCG/ACT AEPB, Inhale 1 puff into the lungs 2 (two) times daily. in the morning and at bedtime., Disp: 60 each, Rfl: 5   montelukast  (SINGULAIR ) 10 MG tablet, Take 1 tablet (10 mg total) by mouth at bedtime., Disp: 90 tablet, Rfl: 1   umeclidinium bromide  (INCRUSE ELLIPTA ) 62.5 MCG/ACT AEPB, Inhale 1 puff into the lungs daily., Disp: 30 each, Rfl: 5  "

## 2024-05-19 ENCOUNTER — Ambulatory Visit

## 2024-05-19 ENCOUNTER — Encounter: Payer: Self-pay | Admitting: Pulmonary Disease

## 2024-05-23 ENCOUNTER — Other Ambulatory Visit: Payer: Self-pay

## 2024-05-23 ENCOUNTER — Ambulatory Visit: Admitting: Pulmonary Disease

## 2024-05-24 ENCOUNTER — Other Ambulatory Visit: Payer: Self-pay

## 2024-05-27 ENCOUNTER — Other Ambulatory Visit (HOSPITAL_COMMUNITY): Payer: Self-pay

## 2024-05-31 ENCOUNTER — Other Ambulatory Visit: Payer: Self-pay

## 2024-05-31 ENCOUNTER — Other Ambulatory Visit: Payer: Self-pay | Admitting: Family Medicine

## 2024-05-31 ENCOUNTER — Ambulatory Visit

## 2024-05-31 DIAGNOSIS — K219 Gastro-esophageal reflux disease without esophagitis: Secondary | ICD-10-CM

## 2024-05-31 DIAGNOSIS — J4550 Severe persistent asthma, uncomplicated: Secondary | ICD-10-CM

## 2024-05-31 MED ORDER — OMEPRAZOLE 40 MG PO CPDR: 40.0000 mg | DELAYED_RELEASE_CAPSULE | Freq: Every day | ORAL | 3 refills | Status: AC

## 2024-05-31 MED ORDER — BENRALIZUMAB 30 MG/ML ~~LOC~~ SOSY
30.0000 mg | PREFILLED_SYRINGE | Freq: Once | SUBCUTANEOUS | Status: AC
  Administered 2024-05-31: 30 mg via SUBCUTANEOUS

## 2024-05-31 NOTE — Progress Notes (Signed)
 Prescription sent electronically rather than faxed

## 2024-05-31 NOTE — Progress Notes (Signed)
 Patient had first fesenra in clinic on left arm  no reaction after waiting 15 minutes She is due every 28days.  She wants to self administer at home.  Fesenra pen 30mg /ml ndc 9689-8169-69 Lot [id number] Expiration 05-2026

## 2024-07-20 ENCOUNTER — Ambulatory Visit (HOSPITAL_BASED_OUTPATIENT_CLINIC_OR_DEPARTMENT_OTHER): Admitting: Cardiology

## 2024-08-02 ENCOUNTER — Ambulatory Visit: Admitting: Allergy

## 2024-09-07 ENCOUNTER — Ambulatory Visit: Admitting: Pulmonary Disease

## 2024-09-23 ENCOUNTER — Encounter: Admitting: Family Medicine

## 2024-10-03 ENCOUNTER — Ambulatory Visit: Admitting: Neurology

## 2024-10-03 ENCOUNTER — Encounter: Admitting: Family Medicine

## 2024-10-11 ENCOUNTER — Encounter
